# Patient Record
Sex: Male | Born: 1950
Health system: Southern US, Community
[De-identification: ages and names within clinical notes are randomized; demographics above are authoritative.]

## PROBLEM LIST (undated history)

## (undated) DIAGNOSIS — E785 Hyperlipidemia, unspecified: Secondary | ICD-10-CM

## (undated) DIAGNOSIS — M1990 Unspecified osteoarthritis, unspecified site: Secondary | ICD-10-CM

## (undated) DIAGNOSIS — K649 Unspecified hemorrhoids: Secondary | ICD-10-CM

## (undated) DIAGNOSIS — C8583 Other specified types of non-Hodgkin lymphoma, intra-abdominal lymph nodes: Principal | ICD-10-CM

## (undated) DIAGNOSIS — E079 Disorder of thyroid, unspecified: Secondary | ICD-10-CM

## (undated) HISTORY — DX: Unspecified osteoarthritis, unspecified site: M19.90

## (undated) HISTORY — DX: Unspecified hemorrhoids: K64.9

## (undated) HISTORY — DX: Disorder of thyroid, unspecified: E07.9

## (undated) HISTORY — DX: Hyperlipidemia, unspecified: E78.5

## (undated) HISTORY — PX: POLYPECTOMY: SHX149

## (undated) HISTORY — PX: TONSILLECTOMY: SUR1361

## (undated) HISTORY — DX: Other specified types of non-hodgkin lymphoma, intra-abdominal lymph nodes: C85.83

---

## 2000-09-29 ENCOUNTER — Encounter: Admission: RE | Admit: 2000-09-29 | Discharge: 2000-10-18 | Payer: Self-pay | Admitting: Internal Medicine

## 2002-05-22 ENCOUNTER — Encounter: Payer: Self-pay | Admitting: Emergency Medicine

## 2002-05-22 ENCOUNTER — Emergency Department (HOSPITAL_COMMUNITY): Admission: EM | Admit: 2002-05-22 | Discharge: 2002-05-22 | Payer: Self-pay | Admitting: Emergency Medicine

## 2004-05-25 ENCOUNTER — Ambulatory Visit: Payer: Self-pay | Admitting: Internal Medicine

## 2005-03-21 DIAGNOSIS — E079 Disorder of thyroid, unspecified: Secondary | ICD-10-CM

## 2005-03-21 HISTORY — DX: Disorder of thyroid, unspecified: E07.9

## 2005-05-12 ENCOUNTER — Ambulatory Visit: Payer: Self-pay | Admitting: Internal Medicine

## 2005-10-04 ENCOUNTER — Ambulatory Visit: Payer: Self-pay | Admitting: Internal Medicine

## 2005-11-30 ENCOUNTER — Ambulatory Visit: Payer: Self-pay | Admitting: Internal Medicine

## 2005-12-09 ENCOUNTER — Ambulatory Visit: Payer: Self-pay | Admitting: Internal Medicine

## 2005-12-28 ENCOUNTER — Ambulatory Visit: Payer: Self-pay | Admitting: Internal Medicine

## 2006-02-22 ENCOUNTER — Ambulatory Visit: Payer: Self-pay | Admitting: Internal Medicine

## 2006-02-22 LAB — CONVERTED CEMR LAB
Chol/HDL Ratio, serum: 6.1
Cholesterol: 188 mg/dL (ref 0–200)
Glucose, Bld: 83 mg/dL (ref 70–99)
HDL: 30.9 mg/dL — ABNORMAL LOW (ref >39.0)
LDL Cholesterol: 136 mg/dL — ABNORMAL HIGH (ref 0–99)
TSH: 4.44 microintl units/mL (ref 0.35–5.50)
Triglyceride fasting, serum: 106 mg/dL (ref 0–149)
VLDL: 21 mg/dL (ref 0–40)

## 2006-03-01 ENCOUNTER — Ambulatory Visit: Payer: Self-pay | Admitting: Internal Medicine

## 2006-03-20 ENCOUNTER — Ambulatory Visit: Payer: Self-pay | Admitting: Gastroenterology

## 2006-03-21 HISTORY — PX: COLONOSCOPY: SHX174

## 2006-03-28 ENCOUNTER — Ambulatory Visit: Payer: Self-pay | Admitting: Gastroenterology

## 2007-02-06 ENCOUNTER — Ambulatory Visit: Payer: Self-pay | Admitting: Internal Medicine

## 2007-03-06 ENCOUNTER — Telehealth (INDEPENDENT_AMBULATORY_CARE_PROVIDER_SITE_OTHER): Payer: Self-pay | Admitting: *Deleted

## 2007-06-19 ENCOUNTER — Ambulatory Visit: Payer: Self-pay | Admitting: Internal Medicine

## 2007-06-19 DIAGNOSIS — E039 Hypothyroidism, unspecified: Secondary | ICD-10-CM | POA: Insufficient documentation

## 2007-06-20 ENCOUNTER — Encounter: Payer: Self-pay | Admitting: Internal Medicine

## 2007-06-25 ENCOUNTER — Encounter (INDEPENDENT_AMBULATORY_CARE_PROVIDER_SITE_OTHER): Payer: Self-pay | Admitting: *Deleted

## 2007-06-25 ENCOUNTER — Telehealth (INDEPENDENT_AMBULATORY_CARE_PROVIDER_SITE_OTHER): Payer: Self-pay | Admitting: *Deleted

## 2007-07-04 ENCOUNTER — Ambulatory Visit: Payer: Self-pay | Admitting: Internal Medicine

## 2007-07-04 DIAGNOSIS — E785 Hyperlipidemia, unspecified: Secondary | ICD-10-CM | POA: Insufficient documentation

## 2007-10-26 ENCOUNTER — Ambulatory Visit: Payer: Self-pay | Admitting: Internal Medicine

## 2007-10-30 ENCOUNTER — Encounter: Payer: Self-pay | Admitting: Internal Medicine

## 2007-11-02 ENCOUNTER — Ambulatory Visit: Payer: Self-pay | Admitting: Internal Medicine

## 2007-11-02 LAB — CONVERTED CEMR LAB
Cholesterol, target level: 200 mg/dL
HDL goal, serum: 40 mg/dL
LDL Goal: 100 mg/dL

## 2007-11-19 ENCOUNTER — Telehealth (INDEPENDENT_AMBULATORY_CARE_PROVIDER_SITE_OTHER): Payer: Self-pay | Admitting: *Deleted

## 2008-02-04 ENCOUNTER — Ambulatory Visit: Payer: Self-pay | Admitting: Internal Medicine

## 2008-02-11 ENCOUNTER — Ambulatory Visit: Payer: Self-pay | Admitting: Internal Medicine

## 2008-02-11 LAB — CONVERTED CEMR LAB
ALT: 20 units/L (ref 0–53)
AST: 25 units/L (ref 0–37)
Albumin: 4.3 g/dL (ref 3.5–5.2)
Alkaline Phosphatase: 55 units/L (ref 39–117)
Bilirubin, Direct: 0.1 mg/dL (ref 0.0–0.3)
Cholesterol, target level: 200 mg/dL
Cholesterol: 159 mg/dL (ref 0–200)
HDL goal, serum: 40 mg/dL
HDL: 31.9 mg/dL — ABNORMAL LOW (ref >39.0)
LDL Cholesterol: 109 mg/dL — ABNORMAL HIGH (ref 0–99)
LDL Goal: 100 mg/dL
Total Bilirubin: 1 mg/dL (ref 0.3–1.2)
Total CHOL/HDL Ratio: 5
Total Protein: 7.5 g/dL (ref 6.0–8.3)
Triglycerides: 93 mg/dL (ref 0–149)
VLDL: 19 mg/dL (ref 0–40)

## 2008-06-11 ENCOUNTER — Telehealth (INDEPENDENT_AMBULATORY_CARE_PROVIDER_SITE_OTHER): Payer: Self-pay | Admitting: *Deleted

## 2008-08-04 ENCOUNTER — Ambulatory Visit: Payer: Self-pay | Admitting: Internal Medicine

## 2008-08-04 LAB — CONVERTED CEMR LAB
ALT: 21 units/L (ref 0–53)
AST: 25 units/L (ref 0–37)
Albumin: 4.2 g/dL (ref 3.5–5.2)
Alkaline Phosphatase: 56 units/L (ref 39–117)
BUN: 21 mg/dL (ref 6–23)
Bilirubin, Direct: 0 mg/dL (ref 0.0–0.3)
Cholesterol: 151 mg/dL (ref 0–200)
Creatinine, Ser: 1 mg/dL (ref 0.4–1.5)
HDL: 33.1 mg/dL — ABNORMAL LOW (ref >39.00)
Hgb A1c MFr Bld: 5.6 % (ref 4.6–6.5)
LDL Cholesterol: 97 mg/dL (ref 0–99)
Potassium: 4.2 meq/L (ref 3.5–5.1)
Total Bilirubin: 0.7 mg/dL (ref 0.3–1.2)
Total CHOL/HDL Ratio: 5
Total Protein: 7.4 g/dL (ref 6.0–8.3)
Triglycerides: 103 mg/dL (ref 0.0–149.0)
VLDL: 20.6 mg/dL (ref 0.0–40.0)

## 2008-08-05 LAB — CONVERTED CEMR LAB: TSH: 3.52 microintl units/mL (ref 0.35–5.50)

## 2008-08-06 ENCOUNTER — Encounter (INDEPENDENT_AMBULATORY_CARE_PROVIDER_SITE_OTHER): Payer: Self-pay | Admitting: *Deleted

## 2008-08-11 ENCOUNTER — Ambulatory Visit: Payer: Self-pay | Admitting: Internal Medicine

## 2008-08-11 LAB — CONVERTED CEMR LAB: PSA: 0.87 ng/mL (ref 0.10–4.00)

## 2008-08-12 ENCOUNTER — Encounter (INDEPENDENT_AMBULATORY_CARE_PROVIDER_SITE_OTHER): Payer: Self-pay | Admitting: *Deleted

## 2009-02-05 ENCOUNTER — Ambulatory Visit: Payer: Self-pay | Admitting: Internal Medicine

## 2009-05-29 ENCOUNTER — Ambulatory Visit: Payer: Self-pay | Admitting: Internal Medicine

## 2009-07-25 ENCOUNTER — Telehealth (INDEPENDENT_AMBULATORY_CARE_PROVIDER_SITE_OTHER): Payer: Self-pay | Admitting: *Deleted

## 2009-08-21 ENCOUNTER — Ambulatory Visit: Payer: Self-pay | Admitting: Internal Medicine

## 2009-08-21 DIAGNOSIS — R030 Elevated blood-pressure reading, without diagnosis of hypertension: Secondary | ICD-10-CM | POA: Insufficient documentation

## 2009-08-28 LAB — CONVERTED CEMR LAB
ALT: 23 units/L (ref 0–53)
AST: 31 units/L (ref 0–37)
Albumin: 4.6 g/dL (ref 3.5–5.2)
Alkaline Phosphatase: 53 units/L (ref 39–117)
BUN: 21 mg/dL (ref 6–23)
Bilirubin, Direct: 0.2 mg/dL (ref 0.0–0.3)
CO2: 30 meq/L (ref 19–32)
Calcium: 9.3 mg/dL (ref 8.4–10.5)
Chloride: 103 meq/L (ref 96–112)
Cholesterol: 158 mg/dL (ref 0–200)
Creatinine, Ser: 1.1 mg/dL (ref 0.4–1.5)
GFR calc non Af Amer: 73.36 mL/min (ref >60)
Glucose, Bld: 82 mg/dL (ref 70–99)
HDL: 35.8 mg/dL — ABNORMAL LOW (ref >39.00)
LDL Cholesterol: 101 mg/dL — ABNORMAL HIGH (ref 0–99)
Potassium: 4.6 meq/L (ref 3.5–5.1)
Sodium: 141 meq/L (ref 135–145)
TSH: 4.46 microintl units/mL (ref 0.35–5.50)
Total Bilirubin: 0.9 mg/dL (ref 0.3–1.2)
Total CHOL/HDL Ratio: 4
Total Protein: 7.6 g/dL (ref 6.0–8.3)
Triglycerides: 104 mg/dL (ref 0.0–149.0)
VLDL: 20.8 mg/dL (ref 0.0–40.0)

## 2009-12-02 ENCOUNTER — Ambulatory Visit: Payer: Self-pay | Admitting: Internal Medicine

## 2009-12-04 LAB — CONVERTED CEMR LAB: TSH: 4.18 microintl units/mL (ref 0.35–5.50)

## 2010-01-05 ENCOUNTER — Ambulatory Visit: Payer: Self-pay | Admitting: Internal Medicine

## 2010-04-18 LAB — CONVERTED CEMR LAB
BUN: 20 mg/dL (ref 6–23)
CO2: 29 meq/L (ref 19–32)
Calcium: 9.4 mg/dL (ref 8.4–10.5)
Chloride: 106 meq/L (ref 96–112)
Cholesterol, target level: 200 mg/dL
Creatinine, Ser: 1.1 mg/dL (ref 0.4–1.5)
GFR calc Af Amer: 90 mL/min
GFR calc non Af Amer: 74 mL/min
Glucose, Bld: 83 mg/dL (ref 70–99)
HDL goal, serum: 40 mg/dL
LDL Goal: 130 mg/dL
PSA: 0.88 ng/mL (ref 0.10–4.00)
Potassium: 4.2 meq/L (ref 3.5–5.1)
Sodium: 140 meq/L (ref 135–145)
TSH: 5.13 microintl units/mL (ref 0.35–5.50)

## 2010-04-20 NOTE — Progress Notes (Signed)
Summary: pt will c/b to schedule appt  Phone Note Outgoing Call Call back at [facility] Phone [phone] Call back at Work Phone [phone]   Summary of Call: Refilled Pravastatin x 1.  Patient is due rov, fasting with Dr. Milderd Meager [facility]  Jul 25, 2009 10:43 AM   Follow-up for Phone Call        patient will call back this afternoon to schedule .Marland KitchenArbie [place]  Jul 27, 2009 1:42 PM   Additional Follow-up for Phone Call Additional follow up Details #1::        Patient is coming in on 6.3.11 Additional Follow-up by: Elna Breslow,  Jul 28, 2009 9:24 AM

## 2010-04-20 NOTE — Assessment & Plan Note (Signed)
Summary: FLU SHOT/NTA   Nurse Visit  CC: Flu shot./kb   Allergies: No Known Drug Allergies  Orders Added: 1)  Admin 1st Vaccine [90471] 2)  Flu Vaccine 44yrs + [id number]               Flu Vaccine Consent Questions     Do you have a history of severe allergic reactions to this vaccine? no    Any prior history of allergic reactions to egg and/or gelatin? no    Do you have a sensitivity to the preservative Thimersol? no    Do you have a past history of Guillan-Barre Syndrome? no    Do you currently have an acute febrile illness? no    Have you ever had a severe reaction to latex? no    Vaccine information given and explained to patient? yes    Are you currently pregnant? no    Lot Number:[id number]   Exp Date:09/17/2009   Site Given  Left Deltoid IMu

## 2010-04-20 NOTE — Assessment & Plan Note (Signed)
Summary: fasting roa//lch   Vital Signs:  Patient profile:   60 year old male Weight:      214 pounds Pulse rate:   76 / minute Resp:     14 per minute BP sitting:   110 / 70  (left arm) Cuff size:   large  Vitals Entered By: Georgette Dover (August 21, 2009 9:22 AM) CC: 1.) Follow-up on B/P-? if its running too low   2.)Fasting for labs , Hypertension Management Comments REVIEWED MED LIST, PATIENT AGREED DOSE AND INSTRUCTION CORRECT  Compared cuffs: 139/82, P:70   CC:  1.) Follow-up on B/P-? if its running too low   2.)Fasting for labs  and Hypertension Management.  History of Present Illness: BP ranges 108/60- 133/70 w/o meds. 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 FH premature MI (F); 3 bro with HTN. Previously labs @ annual  FD physical  Hypertension History:      He denies headache, chest pain, palpitations, dyspnea with exertion, orthopnea, PND, peripheral edema, visual symptoms, neurologic problems, and syncope.        Positive major cardiovascular risk factors include male age 39 years old or older, hyperlipidemia, and family history for ischemic heart disease (males less than 34 years old).  Negative major cardiovascular risk factors include no history of diabetes, no history of hypertension, and non-tobacco-user status.        Further assessment for target organ damage reveals no history of ASHD, stroke/TIA, or peripheral vascular disease.     Allergies (verified): No Known Drug Allergies  Review of Systems Eyes:  Denies blurring, double vision, and vision loss-both eyes; No PMH of hypertennsive retinal changes. CV:  Denies leg cramps with exertion, lightheadness, and near fainting. Neuro:  Denies numbness and tingling.  Physical Exam  General:  well-nourished; alert,appropriate and cooperative throughout examination Eyes:  No corneal or conjunctival inflammation noted.Perrla. Funduscopic exam benign, without hemorrhages, exudates or papilledema or  hypertensive changes Lungs:  Normal respiratory effort, chest expands symmetrically. Lungs are clear to auscultation, no crackles or wheezes. Heart:  Normal rate and regular rhythm. S1 and S2 normal without gallop, murmur, click, rub or other extra sounds. Pulses:  R and L carotid,radial,dorsalis pedis and posterior tibial pulses are full and equal bilaterally Extremities:  No clubbing, cyanosis, edema.   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 1 at bedtime  Orders: Venipuncture [id number]) TLB-Lipid Panel (80061-LIPID) TLB-Hepatic/Liver Function Pnl (80076-HEPATIC)  Problem # 3:  HYPOTHYROIDISM (ICD-244.9)  His updated medication list for this problem includes:    Levoxyl 50 Mcg Tabs (Levothyroxine sodium) .Marland Kitchen... 1 by mouth once daily except wed he takes 1 and 1/2 tabs. needs to schedule labwork before addtional refills.  Orders: Venipuncture [id number]) TLB-TSH (Thyroid Stimulating Hormone) (84443-TSH)  Complete Medication List: 1)  Levoxyl 50 Mcg Tabs (Levothyroxine sodium) .Marland Kitchen.. 1 by mouth once daily except wed he takes 1 and 1/2 tabs. needs to schedule labwork before addtional refills. 2)  Aspirin 81 Mg Tabs (Aspirin) .Marland Kitchen.. 1 by mouth once daily 3)  Multivitamin  4)  Pravastatin Sodium 40 Mg Tabs (Pravastatin sodium) .Marland Kitchen.. 1 at bedtime  Hypertension Assessment/Plan:  The patient's hypertensive risk group is category B: At least one risk factor (excluding diabetes) with no target organ damage.  His calculated 10 year risk of coronary heart disease is 6 %.  Today's blood pressure is 110/70.    Patient  Instructions: 1)  Check your Blood Pressure regularly. If it is above: 135/85 ON AVERAGE  you should make an appointment. Prescriptions: PRAVASTATIN SODIUM 40 MG  TABS (PRAVASTATIN SODIUM) 1 at bedtime  #90 x 1   Entered and Authorized by:   Unice Cobble MD   Signed by:   Unice Cobble MD on 08/21/2009   Method used:   Faxed to ...       [facility] # [address]* (retail)       [place], [place]  [address]       Ph: [phone]       Fax: [phone]   RxID:   [id number] LEVOXYL 50 MCG TABS (LEVOTHYROXINE SODIUM) 1 by mouth once daily except wed he takes 1 and 1/2 tabs. NEEDS TO SCHEDULE LABWORK BEFORE ADDTIONAL REFILLS.  #90 x 1   Entered and Authorized by:   Unice Cobble MD   Signed by:   Unice Cobble MD on 08/21/2009   Method used:   Electronically to        [facility] [phone]* (retail)       [address]       [place], [place]  [address]       Ph: [phone]       Fax: [phone]   RxID:   [id number]

## 2010-04-20 NOTE — Assessment & Plan Note (Signed)
Summary: bronchtsis/kdc   Vital Signs:  Patient profile:   60 year old male Weight:      217.4 pounds Temp:     98.5 degrees F oral Pulse rate:   64 / minute Resp:     15 per minute BP sitting:   110 / 72  (left arm) Cuff size:   large  Vitals Entered By: Georgette Dover (May 29, 2009 1:42 PM) CC: ? Bronchitis Comments REVIEWED MED LIST, PATIENT AGREED DOSE AND INSTRUCTION CORRECT    CC:  ? Bronchitis.  History of Present Illness: Onset 05/23/2009 as NP cough followed low grade fever. Rx: cough syrup(left over). No PMH of asthma; non smoker. No flu shot.  Allergies (verified): No Known Drug Allergies  Review of Systems General:  Denies chills, fever, and sweats. ENT:  Denies nasal congestion and sinus pressure; No facial pain, frontal headache or purulence. Resp:  Complains of sputum productive and wheezing; denies chest pain with inspiration, coughing up blood, and shortness of breath. Allergy:  Denies itching eyes and sneezing.  Physical Exam  General:  Thin but well-nourished,in no acute distress; alert,appropriate and cooperative throughout examination Ears:  External ear exam shows no significant lesions or deformities.  Otoscopic examination reveals clear canals, tympanic membranes are intact bilaterally without bulging, retraction, inflammation or discharge. Hearing is grossly normal bilaterally. Nose:  External nasal examination shows no deformity or inflammation. Nasal mucosa are pink and moist without lesions or exudates. Slight dislocation bilaterally Mouth:  Oral mucosa and oropharynx without lesions or exudates.  Teeth in good repair.Slightly hoarse Lungs:  Normal respiratory effort, chest expands symmetrically. Lungs are clear to auscultation, no crackles or wheezes. Cervical Nodes:  No lymphadenopathy noted Axillary Nodes:  No palpable lymphadenopathy   Impression & Recommendations:  Problem # 1:  BRONCHITIS-ACUTE (ICD-466.0)  The following medications  were removed from the medication list:    Azithromycin 250 Mg Tabs (Azithromycin) .Marland Kitchen... As per pack His updated medication list for this problem includes:    Promethazine Vc/codeine 6.25-5-10 Mg/51ml Syrp (Phenyleph-promethazine-cod) .Marland Kitchen... 1 tsp q 6 hrs as needed    Azithromycin 250 Mg Tabs (Azithromycin) .Marland Kitchen... As per pack  Complete Medication List: 1)  Levoxyl 50 Mcg Tabs (Levothyroxine sodium) .Marland Kitchen.. 1 by mouth once daily except wed he takes 1 and 1/2 tabs 2)  Baby Asa  3)  Multivitamin  4)  Pravastatin Sodium 40 Mg Tabs (Pravastatin sodium) .Marland Kitchen.. 1 qhs 5)  Promethazine Vc/codeine 6.25-5-10 Mg/60ml Syrp (Phenyleph-promethazine-cod) .Marland Kitchen.. 1 tsp q 6 hrs as needed 6)  Azithromycin 250 Mg Tabs (Azithromycin) .... As per pack  Patient Instructions: 1)  Neti pot once daily for any head congestion. 2)  Drink as much fluid as you can tolerate for the next few days. Prescriptions: AZITHROMYCIN 250 MG TABS (AZITHROMYCIN) as per pack  #1 x 0   Entered and Authorized by:   Unice Cobble MD   Signed by:   Unice Cobble MD on 05/29/2009   Method used:   Faxed to ...       [facility] # [address]* (retail)       [place], [place]  [address]       Ph: [phone]       Fax: [phone]   RxID:   [id number]

## 2010-06-15 ENCOUNTER — Telehealth: Payer: Self-pay | Admitting: Internal Medicine

## 2010-06-15 MED ORDER — LEVOTHYROXINE SODIUM 50 MCG PO TABS: 50.0000 ug | ORAL_TABLET | Freq: Every day | ORAL | Status: DC

## 2010-06-15 NOTE — Telephone Encounter (Signed)
Left message on voicemail for patient to call to discuss, reason for call: I reviewed med list in Old EMR system and med was rx'ed as it previously was. No change that I can see.

## 2010-06-15 NOTE — Telephone Encounter (Signed)
I spoke with patient and he indicated that Dr.Hopper changed his rx sometime last year. I reviewed records again and on 08/21/2009 Dr.Hopper did change med:  (Copied/Pasted) LDL is essentially @ goal of < 100. TSH goal = 1-3. INCREASE thyroid to 1 once daily EXCEPT 1& 1/2 T & Th. Recheck TSH in 3 months (244.9). Hopp  Patient aware I will change med instruction and send new script to pharmacy

## 2010-06-15 NOTE — Telephone Encounter (Signed)
Rx for Levothyroxine 50 mg was just refilled. Pt says this isn't the original dosage and wants to know why it was changed. Please advise.

## 2010-06-16 ENCOUNTER — Telehealth: Payer: Self-pay | Admitting: Internal Medicine

## 2010-06-16 NOTE — Telephone Encounter (Signed)
Lipid,Hep,BMP,CBCD,TSH,PSA,Stool Cards,Udip- v70.0/272.4/244.9

## 2010-06-16 NOTE — Telephone Encounter (Signed)
Added lab orders for 6/12 lab visit

## 2010-06-16 NOTE — Telephone Encounter (Signed)
Patient has CPX with Dr Linna Darner on 09/07/2010,   Has labs scheduled for 08/31/2010----may I please have lab orders and diag codes ??     thanks

## 2010-07-24 ENCOUNTER — Other Ambulatory Visit: Payer: Self-pay | Admitting: Internal Medicine

## 2010-08-30 ENCOUNTER — Other Ambulatory Visit: Payer: Self-pay | Admitting: Internal Medicine

## 2010-08-30 DIAGNOSIS — E039 Hypothyroidism, unspecified: Secondary | ICD-10-CM

## 2010-08-30 DIAGNOSIS — E785 Hyperlipidemia, unspecified: Secondary | ICD-10-CM

## 2010-08-30 DIAGNOSIS — Z0000 Encounter for general adult medical examination without abnormal findings: Secondary | ICD-10-CM

## 2010-08-31 ENCOUNTER — Other Ambulatory Visit (INDEPENDENT_AMBULATORY_CARE_PROVIDER_SITE_OTHER): Payer: PRIVATE HEALTH INSURANCE

## 2010-08-31 DIAGNOSIS — Z0000 Encounter for general adult medical examination without abnormal findings: Secondary | ICD-10-CM

## 2010-08-31 DIAGNOSIS — E785 Hyperlipidemia, unspecified: Secondary | ICD-10-CM

## 2010-08-31 DIAGNOSIS — E039 Hypothyroidism, unspecified: Secondary | ICD-10-CM

## 2010-08-31 LAB — CBC WITH DIFFERENTIAL/PLATELET
Basophils Relative: 0.5 % (ref 0.0–3.0)
Eosinophils Relative: 1 % (ref 0.0–5.0)
Lymphocytes Relative: 73.7 % — ABNORMAL HIGH (ref 12.0–46.0)
Neutrophils Relative %: 21.6 % — ABNORMAL LOW (ref 43.0–77.0)
Platelets: 146 10*3/uL — ABNORMAL LOW (ref 150.0–400.0)
RBC: 4.57 Mil/uL (ref 4.22–5.81)
WBC: 16.1 10*3/uL — ABNORMAL HIGH (ref 4.5–10.5)

## 2010-08-31 LAB — POCT URINALYSIS DIPSTICK
Bilirubin, UA: NEGATIVE
Glucose, UA: NEGATIVE
Leukocytes, UA: NEGATIVE
Nitrite, UA: NEGATIVE
pH, UA: 5

## 2010-08-31 LAB — BASIC METABOLIC PANEL
CO2: 27 mEq/L (ref 19–32)
Calcium: 9.3 mg/dL (ref 8.4–10.5)
Chloride: 105 mEq/L (ref 96–112)
Creatinine, Ser: 1.3 mg/dL (ref 0.4–1.5)
GFR: 61.36 mL/min (ref >60.00)
Potassium: 4.4 mEq/L (ref 3.5–5.1)
Sodium: 137 mEq/L (ref 135–145)

## 2010-08-31 LAB — LIPID PANEL
HDL: 34.4 mg/dL — ABNORMAL LOW (ref >39.00)
Total CHOL/HDL Ratio: 5
VLDL: 22.4 mg/dL (ref 0.0–40.0)

## 2010-08-31 LAB — PSA: PSA: 0.92 ng/mL (ref 0.10–4.00)

## 2010-08-31 LAB — HEPATIC FUNCTION PANEL
Bilirubin, Direct: 0.1 mg/dL (ref 0.0–0.3)
Total Bilirubin: 0.7 mg/dL (ref 0.3–1.2)

## 2010-08-31 NOTE — Progress Notes (Signed)
Labs only

## 2010-09-04 ENCOUNTER — Encounter: Payer: Self-pay | Admitting: Internal Medicine

## 2010-09-07 ENCOUNTER — Ambulatory Visit (INDEPENDENT_AMBULATORY_CARE_PROVIDER_SITE_OTHER): Payer: PRIVATE HEALTH INSURANCE | Admitting: Internal Medicine

## 2010-09-07 ENCOUNTER — Encounter: Payer: Self-pay | Admitting: Internal Medicine

## 2010-09-07 VITALS — BP 118/70 | HR 72 | Temp 98.4°F | Resp 14 | Ht 71.0 in | Wt 216.6 lb

## 2010-09-07 DIAGNOSIS — Z23 Encounter for immunization: Secondary | ICD-10-CM

## 2010-09-07 DIAGNOSIS — D72820 Lymphocytosis (symptomatic): Secondary | ICD-10-CM

## 2010-09-07 DIAGNOSIS — E039 Hypothyroidism, unspecified: Secondary | ICD-10-CM

## 2010-09-07 DIAGNOSIS — E785 Hyperlipidemia, unspecified: Secondary | ICD-10-CM

## 2010-09-07 DIAGNOSIS — T148XXA Other injury of unspecified body region, initial encounter: Secondary | ICD-10-CM

## 2010-09-07 DIAGNOSIS — Z0000 Encounter for general adult medical examination without abnormal findings: Secondary | ICD-10-CM

## 2010-09-07 DIAGNOSIS — W57XXXA Bitten or stung by nonvenomous insect and other nonvenomous arthropods, initial encounter: Secondary | ICD-10-CM

## 2010-09-07 MED ORDER — PRAVASTATIN SODIUM 40 MG PO TABS: 40.0000 mg | ORAL_TABLET | Freq: Every day | ORAL | Status: DC

## 2010-09-07 MED ORDER — LEVOTHYROXINE SODIUM 75 MCG PO TABS: 75.0000 ug | ORAL_TABLET | Freq: Every day | ORAL | Status: DC

## 2010-09-07 MED ORDER — TETANUS-DIPHTH-ACELL PERTUSSIS 5-2.5-18.5 LF-MCG/0.5 IM SUSP
0.5000 mL | Freq: Once | INTRAMUSCULAR | Status: AC
  Administered 2010-09-07: 0.5 mL via INTRAMUSCULAR

## 2010-09-07 NOTE — Progress Notes (Signed)
Subjective:    Patient ID: [name] [name], male    DOB: 11/18/1950, 60 y.o.   MRN: [id number]  HPI  [name] [name]  is here for a physical;acute issues include tick bite L ear 4 weeks ago . No sequellae to date.       Review of Systems Patient reports no  Significant  vision/ hearing changes,anorexia, weight change, fever ,adenopathy, persistant / recurrent hoarseness, swallowing issues, chest pain,palpitations, edema,persistant / recurrent cough, hemoptysis, dyspnea(rest, exertional, paroxysmal nocturnal), gastrointestinal  bleeding (melena, rectal bleeding), abdominal pain, excessive heart burn, GU symptoms( dysuria, hematuria, pyuria, voiding/incontinence  issues) syncope, focal weakness, memory loss,numbness & tingling, skin/hair/nail changes,depression, anxiety, or normal bruising/bleeding.Musculoskeletal symptoms of occasional  R foot pain responsive to Aleve. Isolated night sweats X 3 in past 2 weeks.     Objective:   Physical Exam Gen.: Healthy and well-nourished in appearance. Alert, appropriate and cooperative throughout exam. Head: Normocephalic without obvious abnormalities;  pattern alopecia  Eyes: No corneal or conjunctival inflammation noted. Pupils equal round reactive to light and accommodation. Fundal exam is benign without hemorrhages, exudate, papilledema. Extraocular motion intact. Vision grossly normal. Ears: External  ear exam reveals no significant lesions or deformities. Canals clear .TMs normal. Hearing is grossly normal bilaterally. Nose: External nasal exam reveals no deformity or inflammation. Nasal mucosa are pink and moist. No lesions or exudates noted.  Mouth: Oral mucosa and oropharynx reveal no lesions or exudates. Teeth in good repair. Neck: No deformities, masses, or tenderness noted. Range of motion &. Thyroid  normal. Lungs: Normal respiratory effort; chest expands symmetrically. Lungs are clear to auscultation without rales, wheezes, or increased work of  breathing. Heart: Normal rate and rhythm. Normal S1 and S2. No gallop, click, or rub. No  murmur. Abdomen: Bowel sounds normal; abdomen soft and nontender. No masses, organomegaly or hernias noted. Genitalia/ DRE: Hydrocele on L; prostate ULN w/o nodules. Tiny internal hemorrhoid.                                                                                    Musculoskeletal/extremities: No deformity or scoliosis noted of  the thoracic or lumbar spine. No clubbing, cyanosis, edema, or deformity noted. Range of motion  normal .Tone & strength  normal.Joints normal. Nail health  Good.minor crepitus of knees. Vascular: Carotid, radial artery, dorsalis pedis and dorsalis posterior tibial pulses are full and equal. No bruits present. Neurologic: Alert and oriented x3. Deep tendon reflexes symmetrical and normal.           Skin: Intact without suspicious lesions or rashes. Lymph: No cervical, axillary, or inguinal lymphadenopathy present. Psych: Mood and affect are normal. Normally interactive                                                                                         Assessment &  Plan:  #1 comprehensive physical exam; no acute findings #2 see Problem List with Assessments & Recommendations #3 S//P tick bite #4 Lymphocytosis  Plan: see Orders

## 2010-09-07 NOTE — Patient Instructions (Addendum)
Preventive Health Care: Exercise at least 30-45 minutes a day,  3-4 days a week.  Eat a low-fat diet with lots of fruits and vegetables, up to 7-9 servings per day. Avoid obesity; your goal is waist measurement < 40 inches.Consume less than 40 grams of sugar per day from foods & drinks with High Fructose Corn Sugar as #2,3 or # 4 on label. Please  schedule fasting Labs in 10 weeks : NMR Lipoprofile , CBC & dif, TSH. See Diagnoses for Codes . Report fever, rash or other signs of infection

## 2010-09-08 ENCOUNTER — Other Ambulatory Visit (INDEPENDENT_AMBULATORY_CARE_PROVIDER_SITE_OTHER): Payer: PRIVATE HEALTH INSURANCE

## 2010-09-08 DIAGNOSIS — Z1211 Encounter for screening for malignant neoplasm of colon: Secondary | ICD-10-CM

## 2010-09-08 LAB — HEMOCCULT GUIAC POC 1CARD (OFFICE)

## 2010-09-08 LAB — ROCKY MTN SPOTTED FVR AB, IGM-BLOOD: ROCKY MTN SPOTTED FEVER, IGM: 0.33 IV

## 2010-09-08 NOTE — Progress Notes (Signed)
Labs only

## 2010-09-09 ENCOUNTER — Other Ambulatory Visit: Payer: Self-pay | Admitting: Internal Medicine

## 2010-11-16 ENCOUNTER — Other Ambulatory Visit: Payer: Self-pay | Admitting: Internal Medicine

## 2010-11-16 DIAGNOSIS — Z8249 Family history of ischemic heart disease and other diseases of the circulatory system: Secondary | ICD-10-CM

## 2010-11-16 DIAGNOSIS — E039 Hypothyroidism, unspecified: Secondary | ICD-10-CM

## 2010-11-16 DIAGNOSIS — E785 Hyperlipidemia, unspecified: Secondary | ICD-10-CM

## 2010-11-17 ENCOUNTER — Other Ambulatory Visit (INDEPENDENT_AMBULATORY_CARE_PROVIDER_SITE_OTHER): Payer: PRIVATE HEALTH INSURANCE

## 2010-11-17 ENCOUNTER — Other Ambulatory Visit: Payer: Self-pay | Admitting: Internal Medicine

## 2010-11-17 DIAGNOSIS — E039 Hypothyroidism, unspecified: Secondary | ICD-10-CM

## 2010-11-17 DIAGNOSIS — Z8249 Family history of ischemic heart disease and other diseases of the circulatory system: Secondary | ICD-10-CM

## 2010-11-17 DIAGNOSIS — E785 Hyperlipidemia, unspecified: Secondary | ICD-10-CM

## 2010-11-17 LAB — CBC WITH DIFFERENTIAL/PLATELET
Basophils Absolute: 0 10*3/uL (ref 0.0–0.1)
Basophils Relative: 0.3 % (ref 0.0–3.0)
Eosinophils Absolute: 0.1 10*3/uL (ref 0.0–0.7)
HCT: 40.9 % (ref 39.0–52.0)
Hemoglobin: 13.3 g/dL (ref 13.0–17.0)
Lymphs Abs: 10.8 10*3/uL — ABNORMAL HIGH (ref 0.7–4.0)
MCHC: 32.6 g/dL (ref 30.0–36.0)
Monocytes Relative: 4.3 % (ref 3.0–12.0)
Neutro Abs: 2.7 10*3/uL (ref 1.4–7.7)
RDW: 14.2 % (ref 11.5–14.6)

## 2010-11-17 NOTE — Progress Notes (Signed)
Labs only

## 2010-11-30 ENCOUNTER — Telehealth: Payer: Self-pay | Admitting: Internal Medicine

## 2010-11-30 NOTE — Telephone Encounter (Signed)
Message copied by Desmond Dike on Tue Nov 30, 2010 12:11 PM ------      Message from: Hendricks Limes      Created: Fri Nov 19, 2010  6:53 AM       Please ask him to schedule followup appt to discuss labs; bring meds

## 2010-11-30 NOTE — Telephone Encounter (Signed)
Pt has appt on 12/07/2010 to discuss labs

## 2010-12-07 ENCOUNTER — Ambulatory Visit (INDEPENDENT_AMBULATORY_CARE_PROVIDER_SITE_OTHER): Payer: PRIVATE HEALTH INSURANCE | Admitting: Internal Medicine

## 2010-12-07 ENCOUNTER — Encounter: Payer: Self-pay | Admitting: Internal Medicine

## 2010-12-07 VITALS — BP 130/82 | HR 74 | Temp 98.9°F | Wt 215.8 lb

## 2010-12-07 DIAGNOSIS — Z0000 Encounter for general adult medical examination without abnormal findings: Secondary | ICD-10-CM

## 2010-12-07 DIAGNOSIS — D72820 Lymphocytosis (symptomatic): Secondary | ICD-10-CM

## 2010-12-07 DIAGNOSIS — R509 Fever, unspecified: Secondary | ICD-10-CM

## 2010-12-07 DIAGNOSIS — E039 Hypothyroidism, unspecified: Secondary | ICD-10-CM

## 2010-12-07 DIAGNOSIS — R030 Elevated blood-pressure reading, without diagnosis of hypertension: Secondary | ICD-10-CM

## 2010-12-07 DIAGNOSIS — E785 Hyperlipidemia, unspecified: Secondary | ICD-10-CM

## 2010-12-08 LAB — CBC WITH DIFFERENTIAL/PLATELET
Basophils Relative: 0.2 % (ref 0.0–3.0)
Eosinophils Absolute: 0.2 10*3/uL (ref 0.0–0.7)
HCT: 37.2 % — ABNORMAL LOW (ref 39.0–52.0)
Lymphs Abs: 10.2 10*3/uL — ABNORMAL HIGH (ref 0.7–4.0)
MCHC: 33.1 g/dL (ref 30.0–36.0)
MCV: 88.5 fl (ref 78.0–100.0)
Monocytes Absolute: 0.5 10*3/uL (ref 0.1–1.0)
Neutrophils Relative %: 23.2 % — ABNORMAL LOW (ref 43.0–77.0)
RBC: 4.21 Mil/uL — ABNORMAL LOW (ref 4.22–5.81)

## 2010-12-11 ENCOUNTER — Encounter: Payer: Self-pay | Admitting: Internal Medicine

## 2010-12-11 MED ORDER — LEVOTHYROXINE SODIUM 75 MCG PO TABS: 75.0000 ug | ORAL_TABLET | ORAL | Status: DC

## 2010-12-11 NOTE — Progress Notes (Signed)
Subjective:    Patient ID: [name] [name], male    DOB: 09-27-1950, 60 y.o.   MRN: [id number]  HPI  Mr [name] [name]  is here for a physical;acute issues are outlined below    #1Dyslipidemia assessment: Prior Advanced Lipid Testing: NMR Lipoprofile:LDL 88 (1908/1353) HDL 36, TG 91.LDL goal = < 60.   Family history of premature CAD/ MI: Father MI @ 12 ;P aunt MI pre 40.  Nutrition: heart healthy . Diabetes : no . Rx: Pravastatin 40 mg Lab results reviewed :LDL 118, HDL 34.4.(see Plan)   #2 Thyroid function monitor  Medications status(change in dose/brand/mode of administration):generic thyroxine 75 mcg/ day Constitutional: Weight change: no; Fatigue:some with recent illness(see below); Appetite:good  Visual change(blurred/diplopia/visual loss):no Hoarseness:no; Swallowing issues:no Cardiovascular: Palpitations:no; Racing:no; Irregularity:no GI: Constipation:no; Diarrhea:no Derm: Change in nails/hair/skin:no Neuro: Numbness/tingling:no Psych: Anxiety:no, but concerned about CBC & dif results Endo: Temperature intolerance: Heat:no; Cold:no Labs: TSH 4.46 (6.82 in June)   #3 Febrile Illness: Onset/symptoms:6 days ago as "flu like " symptoms with arthralgias/ myalgias Exposures (illness/environmental/extrinsic):no Progression of symptoms:temp decreasing; initially up to 102 Present symptoms: Frontal headache:no Facial pain:no Nasal purulence:no Sore throat:no Dental pain:no Lymphadenopathy:no Wheezing/shortness of breath:no Cough/sputum/hemoptysis:no Labs: WBC 14.3 with 75.6 L, 19.1 N          Review of Systems     Objective:   Physical Exam Gen.: Healthy and well-nourished in appearance. Alert, appropriate and cooperative throughout exam. Eyes: No corneal or conjunctival inflammation noted.  Extraocular motion intact. No lid lag. No icterus Ears: External  ear exam reveals no significant lesions or deformities. Canals clear .TMs normal. Hearing is grossly normal  bilaterally. Nose: External nasal exam reveals no deformity or inflammation. Nasal mucosa are pink and moist. No lesions or exudates noted. Septum  Dislocated slightly Mouth: Oral mucosa and oropharynx reveal no lesions or exudates. Teeth in good repair. Neck: No deformities, masses, or tenderness noted. Range of motion &. Thyroid normal. Lungs: Normal respiratory effort; chest expands symmetrically. Lungs are clear to auscultation without rales, wheezes, or increased work of breathing. Heart: Normal rate and rhythm. Normal S1 and S2. No gallop, click, or rub. S4; no  murmur. Abdomen: Bowel sounds normal; abdomen soft and nontender. No masses, organomegaly or hernias noted.                                                          Musculoskeletal/extremities: No clubbing, cyanosis, edema, or deformity noted. .Joints normal. Nail health  good. Vascular: Carotid, radial artery, dorsalis pedis and  posterior tibial pulses are full and equal. No bruits present. Neurologic: Alert and oriented x3.   Skin: Intact without suspicious lesions or rashes. Lymph: No cervical, axillary  lymphadenopathy present. Psych: Mood and affect are normal. Normally interactive                                                                                         Assessment & Plan:   #1#1 comprehensive physical  exam #2 dyslipidemia; premature CAD/MI in [place]. LDL not @ goal. Pravastatin will be  changed to Simvastatin 40 mg @ next refill in 90 days if LDL not @ least  < 100 (ideally <70) with increase in thyroid dose # 3 hypothyroidism ; TSH goal = 1-3. Levothyroxine 75 mcg 1 daily  EXCEPT 1&1/2 Tues & Th. Recheck TSH in 10 weeks #4 febrile illness in context of persistent Lymphocytosis; R/O CLL. Recheck CBC & dif because of recent flu like illness which appears to be resolving.Heme referral

## 2010-12-11 NOTE — Patient Instructions (Signed)
Please  schedule fasting Labs : Lipids, hepatic panel, CBC & dif, TSH  In 10 weeks. Please bring these instructions to that Lab appt.

## 2010-12-20 ENCOUNTER — Ambulatory Visit: Payer: PRIVATE HEALTH INSURANCE | Admitting: Hematology & Oncology

## 2010-12-21 ENCOUNTER — Other Ambulatory Visit (INDEPENDENT_AMBULATORY_CARE_PROVIDER_SITE_OTHER): Payer: PRIVATE HEALTH INSURANCE

## 2010-12-21 DIAGNOSIS — D649 Anemia, unspecified: Secondary | ICD-10-CM

## 2010-12-21 NOTE — Progress Notes (Signed)
Labs only

## 2010-12-22 LAB — FOLATE: Folate: >24.8 ng/mL (ref >5.9)

## 2010-12-22 LAB — CBC WITH DIFFERENTIAL/PLATELET
Basophils Absolute: 0.1 10*3/uL (ref 0.0–0.1)
Eosinophils Relative: 0.6 % (ref 0.0–5.0)
HCT: 39.6 % (ref 39.0–52.0)
Hemoglobin: 12.9 g/dL — ABNORMAL LOW (ref 13.0–17.0)
Lymphocytes Relative: 83.3 % — ABNORMAL HIGH (ref 12.0–46.0)
Lymphs Abs: 13.1 10*3/uL — ABNORMAL HIGH (ref 0.7–4.0)
Monocytes Relative: 0.6 % — ABNORMAL LOW (ref 3.0–12.0)
Neutro Abs: 2.3 10*3/uL (ref 1.4–7.7)
RBC: 4.42 Mil/uL (ref 4.22–5.81)
RDW: 14.3 % (ref 11.5–14.6)
WBC: 15.7 10*3/uL — ABNORMAL HIGH (ref 4.5–10.5)

## 2010-12-22 LAB — VITAMIN B12: Vitamin B-12: 480 pg/mL (ref 211–911)

## 2010-12-22 LAB — IBC PANEL: Saturation Ratios: 12.9 % — ABNORMAL LOW (ref 20.0–50.0)

## 2010-12-23 ENCOUNTER — Other Ambulatory Visit (INDEPENDENT_AMBULATORY_CARE_PROVIDER_SITE_OTHER): Payer: PRIVATE HEALTH INSURANCE

## 2010-12-23 DIAGNOSIS — Z1211 Encounter for screening for malignant neoplasm of colon: Secondary | ICD-10-CM

## 2010-12-23 LAB — HEMOCCULT GUIAC POC 1CARD (OFFICE)

## 2011-01-05 ENCOUNTER — Other Ambulatory Visit: Payer: Self-pay | Admitting: Hematology & Oncology

## 2011-01-05 ENCOUNTER — Ambulatory Visit (HOSPITAL_BASED_OUTPATIENT_CLINIC_OR_DEPARTMENT_OTHER): Payer: PRIVATE HEALTH INSURANCE | Admitting: Hematology & Oncology

## 2011-01-05 ENCOUNTER — Other Ambulatory Visit (HOSPITAL_COMMUNITY)
Admission: RE | Admit: 2011-01-05 | Discharge: 2011-01-05 | Disposition: A | Discharge status: Home / Self Care | Payer: PRIVATE HEALTH INSURANCE | Source: Ambulatory Visit | Attending: Hematology & Oncology | Admitting: Hematology & Oncology

## 2011-01-05 DIAGNOSIS — D72820 Lymphocytosis (symptomatic): Secondary | ICD-10-CM | POA: Insufficient documentation

## 2011-01-05 LAB — CBC WITH DIFFERENTIAL (CANCER CENTER ONLY)
BASO#: 0.1 10*3/uL (ref 0.0–0.2)
HCT: 38.6 % — ABNORMAL LOW (ref 38.7–49.9)
HGB: 13.3 g/dL (ref 13.0–17.1)
LYMPH#: 13.6 10*3/uL — ABNORMAL HIGH (ref 0.9–3.3)
LYMPH%: 78 % — ABNORMAL HIGH (ref 14.0–48.0)
MCHC: 34.5 g/dL (ref 32.0–35.9)
MCV: 86 fL (ref 82–98)
MONO#: 0.4 10*3/uL (ref 0.1–0.9)
NEUT%: 18.6 % — ABNORMAL LOW (ref 40.0–80.0)
WBC: 17.4 10*3/uL — ABNORMAL HIGH (ref 4.0–10.0)

## 2011-01-05 LAB — CHCC SATELLITE - SMEAR

## 2011-01-06 ENCOUNTER — Other Ambulatory Visit: Payer: Self-pay | Admitting: Hematology & Oncology

## 2011-01-06 DIAGNOSIS — D72820 Lymphocytosis (symptomatic): Secondary | ICD-10-CM

## 2011-01-10 LAB — PROTEIN ELECTROPHORESIS, SERUM
Albumin ELP: 55.1 % — ABNORMAL LOW (ref 55.8–66.1)
Alpha-1-Globulin: 4.3 % (ref 2.9–4.9)
Beta Globulin: 5 % (ref 4.7–7.2)
Total Protein, Serum Electrophoresis: 7.6 g/dL (ref 6.0–8.3)

## 2011-01-10 LAB — FLOW CYTOMETRY - CHCC SATELLITE

## 2011-01-11 ENCOUNTER — Other Ambulatory Visit: Payer: Self-pay | Admitting: Hematology & Oncology

## 2011-01-11 ENCOUNTER — Ambulatory Visit (HOSPITAL_COMMUNITY)
Admission: RE | Admit: 2011-01-11 | Discharge: 2011-01-11 | Disposition: A | Discharge status: Home / Self Care | Payer: PRIVATE HEALTH INSURANCE | Source: Ambulatory Visit | Attending: Hematology & Oncology | Admitting: Hematology & Oncology

## 2011-01-11 DIAGNOSIS — E039 Hypothyroidism, unspecified: Secondary | ICD-10-CM | POA: Insufficient documentation

## 2011-01-11 DIAGNOSIS — D72820 Lymphocytosis (symptomatic): Secondary | ICD-10-CM | POA: Insufficient documentation

## 2011-01-11 DIAGNOSIS — E785 Hyperlipidemia, unspecified: Secondary | ICD-10-CM | POA: Insufficient documentation

## 2011-01-11 DIAGNOSIS — Z79899 Other long term (current) drug therapy: Secondary | ICD-10-CM | POA: Insufficient documentation

## 2011-01-11 DIAGNOSIS — Z01810 Encounter for preprocedural cardiovascular examination: Secondary | ICD-10-CM | POA: Insufficient documentation

## 2011-01-11 LAB — CBC
HCT: 38.9 % — ABNORMAL LOW (ref 39.0–52.0)
Hemoglobin: 12.9 g/dL — ABNORMAL LOW (ref 13.0–17.0)
MCH: 29.1 pg (ref 26.0–34.0)
RBC: 4.43 MIL/uL (ref 4.22–5.81)

## 2011-01-11 LAB — DIFFERENTIAL
Basophils Relative: 0 % (ref 0–1)
Eosinophils Relative: 1 % (ref 0–5)
Lymphs Abs: 10.1 10*3/uL — ABNORMAL HIGH (ref 0.7–4.0)
Monocytes Absolute: 0.8 10*3/uL (ref 0.1–1.0)
Monocytes Relative: 6 % (ref 3–12)
Neutro Abs: 2.7 10*3/uL (ref 1.7–7.7)

## 2011-01-12 ENCOUNTER — Other Ambulatory Visit: Payer: Self-pay | Admitting: Hematology & Oncology

## 2011-01-13 ENCOUNTER — Ambulatory Visit (HOSPITAL_BASED_OUTPATIENT_CLINIC_OR_DEPARTMENT_OTHER)
Admission: RE | Admit: 2011-01-13 | Discharge: 2011-01-13 | Disposition: A | Discharge status: Home / Self Care | Payer: PRIVATE HEALTH INSURANCE | Source: Ambulatory Visit | Attending: Hematology & Oncology | Admitting: Hematology & Oncology

## 2011-01-13 ENCOUNTER — Other Ambulatory Visit (HOSPITAL_COMMUNITY): Payer: PRIVATE HEALTH INSURANCE

## 2011-01-13 DIAGNOSIS — C9110 Chronic lymphocytic leukemia of B-cell type not having achieved remission: Secondary | ICD-10-CM | POA: Insufficient documentation

## 2011-01-13 DIAGNOSIS — D72820 Lymphocytosis (symptomatic): Secondary | ICD-10-CM

## 2011-01-13 DIAGNOSIS — R161 Splenomegaly, not elsewhere classified: Secondary | ICD-10-CM | POA: Insufficient documentation

## 2011-01-13 MED ORDER — IOHEXOL 300 MG/ML  SOLN
100.0000 mL | Freq: Once | INTRAMUSCULAR | Status: AC | PRN
  Administered 2011-01-13: 100 mL via INTRAVENOUS

## 2011-01-19 ENCOUNTER — Telehealth: Payer: Self-pay | Admitting: Hematology & Oncology

## 2011-01-19 LAB — CHROMOSOME ANALYSIS, BONE MARROW

## 2011-01-19 LAB — TISSUE HYBRIDIZATION (BONE MARROW)-NCBH

## 2011-01-24 NOTE — Telephone Encounter (Signed)
Pt aware.

## 2011-01-27 ENCOUNTER — Encounter: Payer: Self-pay | Admitting: Hematology & Oncology

## 2011-01-27 ENCOUNTER — Other Ambulatory Visit: Payer: Self-pay | Admitting: Hematology & Oncology

## 2011-01-27 DIAGNOSIS — C8583 Other specified types of non-Hodgkin lymphoma, intra-abdominal lymph nodes: Secondary | ICD-10-CM

## 2011-01-27 HISTORY — DX: Other specified types of non-hodgkin lymphoma, intra-abdominal lymph nodes: C85.83

## 2011-01-28 ENCOUNTER — Ambulatory Visit: Payer: BC Managed Care – PPO | Admitting: Hematology & Oncology

## 2011-01-28 ENCOUNTER — Ambulatory Visit (HOSPITAL_BASED_OUTPATIENT_CLINIC_OR_DEPARTMENT_OTHER): Payer: BC Managed Care – PPO

## 2011-01-28 ENCOUNTER — Other Ambulatory Visit: Payer: Self-pay | Admitting: Hematology & Oncology

## 2011-01-28 VITALS — BP 123/70 | HR 74 | Temp 98.5°F

## 2011-01-28 DIAGNOSIS — Z5112 Encounter for antineoplastic immunotherapy: Secondary | ICD-10-CM

## 2011-01-28 DIAGNOSIS — C8583 Other specified types of non-Hodgkin lymphoma, intra-abdominal lymph nodes: Secondary | ICD-10-CM

## 2011-01-28 DIAGNOSIS — Z5111 Encounter for antineoplastic chemotherapy: Secondary | ICD-10-CM

## 2011-01-28 DIAGNOSIS — C9110 Chronic lymphocytic leukemia of B-cell type not having achieved remission: Secondary | ICD-10-CM

## 2011-01-28 MED ORDER — SODIUM CHLORIDE 0.9 % IV SOLN
375.0000 mg/m2 | Freq: Once | INTRAVENOUS | Status: AC
  Administered 2011-01-28: 800 mg via INTRAVENOUS
  Filled 2011-01-28: qty 80

## 2011-01-28 MED ORDER — DIPHENHYDRAMINE HCL 25 MG PO CAPS
50.0000 mg | ORAL_CAPSULE | Freq: Once | ORAL | Status: AC
  Administered 2011-01-28: 50 mg via ORAL

## 2011-01-28 MED ORDER — SODIUM CHLORIDE 0.9 % IV SOLN
800.0000 mg/m2 | Freq: Once | INTRAVENOUS | Status: AC
  Administered 2011-01-28: 1760 mg via INTRAVENOUS
  Filled 2011-01-28: qty 88

## 2011-01-28 MED ORDER — PREDNISONE 20 MG PO TABS: 60.0000 mg | ORAL_TABLET | Freq: Every day | ORAL | Status: AC

## 2011-01-28 MED ORDER — VINCRISTINE SULFATE CHEMO INJECTION 1 MG/ML
2.0000 mg | Freq: Once | INTRAVENOUS | Status: AC
  Administered 2011-01-28: 2 mg via INTRAVENOUS
  Filled 2011-01-28: qty 2

## 2011-01-28 MED ORDER — ONDANSETRON HCL 8 MG PO TABS: ORAL_TABLET | ORAL | Status: DC

## 2011-01-28 MED ORDER — ONDANSETRON 16 MG/50ML IVPB (CHCC)
16.0000 mg | Freq: Once | INTRAVENOUS | Status: AC
  Administered 2011-01-28: 16 mg via INTRAVENOUS
  Filled 2011-01-28: qty 16

## 2011-01-28 MED ORDER — PROCHLORPERAZINE 25 MG RE SUPP: 25.0000 mg | Freq: Two times a day (BID) | RECTAL | Status: DC | PRN

## 2011-01-28 MED ORDER — ACETAMINOPHEN 325 MG PO TABS
650.0000 mg | ORAL_TABLET | Freq: Once | ORAL | Status: AC
  Administered 2011-01-28: 650 mg via ORAL

## 2011-01-28 MED ORDER — DEXAMETHASONE SODIUM PHOSPHATE 4 MG/ML IJ SOLN
20.0000 mg | Freq: Once | INTRAMUSCULAR | Status: AC
  Administered 2011-01-28: 20 mg via INTRAVENOUS

## 2011-01-28 MED ORDER — LORAZEPAM 0.5 MG PO TABS: 0.5000 mg | ORAL_TABLET | Freq: Four times a day (QID) | ORAL | Status: DC | PRN

## 2011-01-28 MED ORDER — SODIUM CHLORIDE 0.9 % IV SOLN
Freq: Once | INTRAVENOUS | Status: AC
  Administered 2011-01-28: 10:00:00 via INTRAVENOUS

## 2011-01-28 MED ORDER — PROCHLORPERAZINE MALEATE 10 MG PO TABS: 10.0000 mg | ORAL_TABLET | Freq: Four times a day (QID) | ORAL | Status: DC | PRN

## 2011-02-01 ENCOUNTER — Telehealth: Payer: Self-pay | Admitting: Family

## 2011-02-02 ENCOUNTER — Encounter: Payer: Self-pay | Admitting: *Deleted

## 2011-02-02 NOTE — Telephone Encounter (Signed)
No answer, left message for wife to call back tomorrow if still experiencing hoarseness.

## 2011-02-03 NOTE — Op Note (Signed)
  NAME:  GENEVA, LEAVELLE NO.:  [id number]  MEDICAL RECORD NO.:  [id number]  LOCATION:  MDC                          FACILITY:  [facility]  PHYSICIAN:  [name], M.D.  DATE OF BIRTH:  12/12/1950  DATE OF PROCEDURE:  01/11/2011 DATE OF DISCHARGE:                              OPERATIVE REPORT   NATURE OF PROCEDURE:  Left posterior iliac crest bone marrow biopsy and aspirate.  [name] was brought to the short-stay unit.  He had an IV placed without difficulty.  His Mallampati score was 1.  His ASA class was 1.  He was then placed onto his side.  He had the appropriate time-out procedure performed.  He received total of 5 mg of Versed and 25 mg Demerol for IV sedation.  The left posterior iliac crest region was prepped and draped in sterile fashion.  10 cc of 2% lidocaine was infiltrated under the skin down the periosteum.  A #11 scalpel was used to make incision into the skin.  Despite 4 attempts, I could not aspirate out any liquid marrow.  We then obtained an excellent bone marrow biopsy core.  The patient tolerated the procedure well.  We just decided properly.     [name], M.D.     PRE/MEDQ  D:  01/11/2011  T:  01/11/2011  Job:  [id number]  Electronically Signed by Burney Gauze  on 02/02/2011 05:59:36 PM

## 2011-02-11 NOTE — Progress Notes (Signed)
Only here for chemo not MD visit

## 2011-02-15 ENCOUNTER — Telehealth: Payer: Self-pay | Admitting: *Deleted

## 2011-02-15 NOTE — Telephone Encounter (Signed)
Pt left message on voicemail stating he was in need of something for sleep. He was taking Lorazepam that was prescribed for nausea/anxiety/sleep (which was helping but is now out of) but would like something that is specifically for sleep. Will have Virgina Organ, NP review the request.

## 2011-02-15 NOTE — Telephone Encounter (Signed)
I have never seen this pt. Deferred to Dr. Marin Olp.

## 2011-02-15 NOTE — Telephone Encounter (Signed)
I have never seen this pt. Defer to Dr. Marin Olp.

## 2011-02-16 ENCOUNTER — Other Ambulatory Visit: Payer: PRIVATE HEALTH INSURANCE

## 2011-02-16 ENCOUNTER — Other Ambulatory Visit: Payer: Self-pay | Admitting: *Deleted

## 2011-02-16 ENCOUNTER — Other Ambulatory Visit: Payer: Self-pay | Admitting: Hematology & Oncology

## 2011-02-16 DIAGNOSIS — G4701 Insomnia due to medical condition: Secondary | ICD-10-CM

## 2011-02-16 MED ORDER — ZOLPIDEM TARTRATE 10 MG PO TABS: 10.0000 mg | ORAL_TABLET | Freq: Every evening | ORAL | Status: DC | PRN

## 2011-02-18 ENCOUNTER — Ambulatory Visit: Payer: PRIVATE HEALTH INSURANCE

## 2011-02-18 ENCOUNTER — Telehealth: Payer: Self-pay | Admitting: *Deleted

## 2011-02-18 ENCOUNTER — Other Ambulatory Visit: Payer: Self-pay | Admitting: Pharmacist

## 2011-02-18 ENCOUNTER — Ambulatory Visit (HOSPITAL_BASED_OUTPATIENT_CLINIC_OR_DEPARTMENT_OTHER): Payer: BC Managed Care – PPO | Admitting: Lab

## 2011-02-18 ENCOUNTER — Ambulatory Visit (HOSPITAL_BASED_OUTPATIENT_CLINIC_OR_DEPARTMENT_OTHER): Payer: BC Managed Care – PPO | Admitting: Hematology & Oncology

## 2011-02-18 ENCOUNTER — Other Ambulatory Visit: Payer: Self-pay | Admitting: Hematology & Oncology

## 2011-02-18 VITALS — BP 118/74 | HR 61 | Temp 97.6°F

## 2011-02-18 VITALS — BP 142/84 | HR 74 | Temp 99.0°F | Ht 71.0 in | Wt 216.0 lb

## 2011-02-18 DIAGNOSIS — Z5111 Encounter for antineoplastic chemotherapy: Secondary | ICD-10-CM

## 2011-02-18 DIAGNOSIS — Z5112 Encounter for antineoplastic immunotherapy: Secondary | ICD-10-CM

## 2011-02-18 DIAGNOSIS — D72820 Lymphocytosis (symptomatic): Secondary | ICD-10-CM

## 2011-02-18 DIAGNOSIS — C8589 Other specified types of non-Hodgkin lymphoma, extranodal and solid organ sites: Secondary | ICD-10-CM

## 2011-02-18 DIAGNOSIS — C8583 Other specified types of non-Hodgkin lymphoma, intra-abdominal lymph nodes: Secondary | ICD-10-CM

## 2011-02-18 DIAGNOSIS — C8298 Follicular lymphoma, unspecified, lymph nodes of multiple sites: Secondary | ICD-10-CM

## 2011-02-18 LAB — CBC WITH DIFFERENTIAL (CANCER CENTER ONLY)
BASO%: 1 % (ref 0.0–2.0)
EOS%: 1 % (ref 0.0–7.0)
HGB: 12.5 g/dL — ABNORMAL LOW (ref 13.0–17.1)
LYMPH#: 1.6 10*3/uL (ref 0.9–3.3)
MCH: 29.6 pg (ref 28.0–33.4)
MCHC: 34.2 g/dL (ref 32.0–35.9)
MONO%: 11.5 % (ref 0.0–13.0)
NEUT#: 1.9 10*3/uL (ref 1.5–6.5)
Platelets: 111 10*3/uL — ABNORMAL LOW (ref 145–400)
RDW: 13.6 % (ref 11.1–15.7)
WBC: 4 10*3/uL (ref 4.0–10.0)

## 2011-02-18 LAB — COMPREHENSIVE METABOLIC PANEL
ALT: 14 U/L (ref 0–53)
AST: 21 U/L (ref 0–37)
Alkaline Phosphatase: 56 U/L (ref 39–117)
BUN: 23 mg/dL (ref 6–23)
Calcium: 9.1 mg/dL (ref 8.4–10.5)
Chloride: 105 mEq/L (ref 96–112)
Creatinine, Ser: 1.08 mg/dL (ref 0.50–1.35)
Total Bilirubin: 0.4 mg/dL (ref 0.3–1.2)

## 2011-02-18 LAB — CHCC SATELLITE - SMEAR

## 2011-02-18 MED ORDER — VINCRISTINE SULFATE CHEMO INJECTION 1 MG/ML
2.0000 mg | Freq: Once | INTRAVENOUS | Status: AC
  Administered 2011-02-18: 2 mg via INTRAVENOUS
  Filled 2011-02-18: qty 2

## 2011-02-18 MED ORDER — ONDANSETRON 16 MG/50ML IVPB (CHCC)
16.0000 mg | Freq: Once | INTRAVENOUS | Status: AC
  Administered 2011-02-18: 16 mg via INTRAVENOUS
  Filled 2011-02-18: qty 16

## 2011-02-18 MED ORDER — ACETAMINOPHEN 325 MG PO TABS
650.0000 mg | ORAL_TABLET | Freq: Once | ORAL | Status: AC
  Administered 2011-02-18: 650 mg via ORAL

## 2011-02-18 MED ORDER — DEXAMETHASONE SODIUM PHOSPHATE 4 MG/ML IJ SOLN
20.0000 mg | Freq: Once | INTRAMUSCULAR | Status: AC
  Administered 2011-02-18: 20 mg via INTRAVENOUS

## 2011-02-18 MED ORDER — SODIUM CHLORIDE 0.9 % IV SOLN: 375.0000 mg/m2 | Freq: Once | INTRAVENOUS | Status: DC

## 2011-02-18 MED ORDER — PREDNISONE 20 MG PO TABS
20.0000 mg | ORAL_TABLET | Freq: Three times a day (TID) | ORAL | Status: DC
  Filled 2011-02-18: qty 1

## 2011-02-18 MED ORDER — LORAZEPAM 0.5 MG PO TABS: 0.5000 mg | ORAL_TABLET | Freq: Four times a day (QID) | ORAL | Status: DC | PRN

## 2011-02-18 MED ORDER — SODIUM CHLORIDE 0.9 % IV SOLN
800.0000 mg/m2 | Freq: Once | INTRAVENOUS | Status: AC
  Administered 2011-02-18: 1760 mg via INTRAVENOUS
  Filled 2011-02-18: qty 88

## 2011-02-18 MED ORDER — SODIUM CHLORIDE 0.9 % IV SOLN
375.0000 mg/m2 | Freq: Once | INTRAVENOUS | Status: AC
  Administered 2011-02-18: 800 mg via INTRAVENOUS
  Filled 2011-02-18: qty 80

## 2011-02-18 MED ORDER — SODIUM CHLORIDE 0.9 % IV SOLN
Freq: Once | INTRAVENOUS | Status: AC
  Administered 2011-02-18: 09:00:00 via INTRAVENOUS

## 2011-02-18 MED ORDER — DIPHENHYDRAMINE HCL 25 MG PO CAPS
50.0000 mg | ORAL_CAPSULE | Freq: Once | ORAL | Status: AC
  Administered 2011-02-18: 50 mg via ORAL

## 2011-02-18 NOTE — Telephone Encounter (Signed)
Called patients wife to verify that Prednisone RX went through.  It did not per wife.  Will follow up

## 2011-02-18 NOTE — Progress Notes (Signed)
CC:   [name] Penna. Hopper, MD,FACP,FCCP  DIAGNOSIS:  Marginal zone lymphoma.  CURRENT THERAPY:  The patient is status post cycle 1 of R-CVP.  INTERIM HISTORY:  [name] [name] comes in for A followup.  He tolerated 1st cycle of chemotherapy well.  He had few side effects.  There was a little nausea.  He had a little constipation.  He does state occasional discomfort over on the left upper quadrant.  He has had no cough.  There has been no mouth sores.  He has not noticed any rashes.  He has had no fever or sweats.  PHYSICAL EXAM:  General: This is a well-developed, well-nourished, white gentleman in no obvious distress.  Vital signs show temperature of 99, pulse 74, respiratory 18, blood pressure 142/84, and weight is 216. Head and neck exam shows a normocephalic, atraumatic skull.  There are no ocular or oral lesions.  There are no palpable cervical or supraclavicular lymph nodes.  Lungs are clear bilaterally.  Cardiac exam: Regular rate and rhythm with a normal S1 and S2.  There are no murmurs, rubs, or bruits.  Axillary exam shows no bilateral axillary adenopathy.  Extremities shows no clubbing, cyanosis, or edema. Neurological exam shows no focal neurological deficits.  Skin exam: No rashes, ecchymoses, or petechia.  LABORATORY STUDIES:  White cell count is 4, hemoglobin 12.5, hematocrit 36.5, and platelet count 111.  White cell differential shows 46 segs and 40 lymphocytes.  IMPRESSION:  [name] [name] is a 60 year old gentleman with a marginal zone lymphoma.  We have him on systemic chemotherapy.  He did have his bone marrow biopsy and aspirate done.  This was on the 23rd.  The pathology report [id number]) showed involvement by non- Hodgkin lymphoma.  The bone marrow was normal cellular.  Cytogenetics on the bone marrow did not show any abnormal chromosomes.  We will go ahead and plan for cycle #2 of treatment.  We will probably rescan after 4 cycles.  I suspect that all we should  need is 6 cycles.  We will get [name] [name] back in 3 weeks for his next treatment.    ______________________________ [name], M.D. PRE/MEDQ  D:  02/18/2011  T:  02/18/2011  Job:  [id number]

## 2011-02-18 NOTE — Progress Notes (Signed)
This office note has been dictated.

## 2011-02-19 HISTORY — PX: PORTACATH PLACEMENT: SHX2246

## 2011-02-21 ENCOUNTER — Other Ambulatory Visit: Payer: Self-pay | Admitting: Hematology & Oncology

## 2011-02-22 ENCOUNTER — Other Ambulatory Visit: Payer: Self-pay | Admitting: Radiology

## 2011-02-23 ENCOUNTER — Encounter (HOSPITAL_COMMUNITY): Payer: Self-pay | Admitting: Pharmacy Technician

## 2011-02-25 ENCOUNTER — Other Ambulatory Visit: Payer: Self-pay | Admitting: Hematology & Oncology

## 2011-02-25 ENCOUNTER — Inpatient Hospital Stay (HOSPITAL_COMMUNITY): Admission: RE | Admit: 2011-02-25 | Payer: BC Managed Care – PPO | Source: Ambulatory Visit

## 2011-02-25 ENCOUNTER — Ambulatory Visit (HOSPITAL_COMMUNITY)
Admission: RE | Admit: 2011-02-25 | Discharge: 2011-02-25 | Disposition: A | Discharge status: Home / Self Care | Payer: BC Managed Care – PPO | Source: Ambulatory Visit | Attending: Hematology & Oncology | Admitting: Hematology & Oncology

## 2011-02-25 DIAGNOSIS — E039 Hypothyroidism, unspecified: Secondary | ICD-10-CM | POA: Insufficient documentation

## 2011-02-25 DIAGNOSIS — C8583 Other specified types of non-Hodgkin lymphoma, intra-abdominal lymph nodes: Secondary | ICD-10-CM | POA: Insufficient documentation

## 2011-02-25 DIAGNOSIS — Z79899 Other long term (current) drug therapy: Secondary | ICD-10-CM | POA: Insufficient documentation

## 2011-02-25 DIAGNOSIS — Z7982 Long term (current) use of aspirin: Secondary | ICD-10-CM | POA: Insufficient documentation

## 2011-02-25 DIAGNOSIS — E785 Hyperlipidemia, unspecified: Secondary | ICD-10-CM | POA: Insufficient documentation

## 2011-02-25 MED ORDER — MIDAZOLAM HCL 5 MG/5ML IJ SOLN
INTRAMUSCULAR | Status: AC | PRN
  Administered 2011-02-25: 2 mg via INTRAVENOUS

## 2011-02-25 MED ORDER — LIDOCAINE-EPINEPHRINE 2 %-1:100000 IJ SOLN
INTRAMUSCULAR | Status: AC
  Filled 2011-02-25: qty 1

## 2011-02-25 MED ORDER — MIDAZOLAM HCL 2 MG/2ML IJ SOLN
INTRAMUSCULAR | Status: AC
  Filled 2011-02-25: qty 2

## 2011-02-25 MED ORDER — HEPARIN SOD (PORK) LOCK FLUSH 100 UNIT/ML IV SOLN
500.0000 [IU] | Freq: Once | INTRAVENOUS | Status: AC
  Administered 2011-02-25: 500 [IU] via INTRAVENOUS

## 2011-02-25 MED ORDER — LIDOCAINE HCL 1 % IJ SOLN
INTRAMUSCULAR | Status: AC
  Filled 2011-02-25: qty 20

## 2011-02-25 MED ORDER — FENTANYL CITRATE 0.05 MG/ML IJ SOLN
INTRAMUSCULAR | Status: AC
  Filled 2011-02-25: qty 4

## 2011-02-25 MED ORDER — SODIUM CHLORIDE 0.9 % IV SOLN: INTRAVENOUS | Status: DC

## 2011-02-25 MED ORDER — FENTANYL CITRATE 0.05 MG/ML IJ SOLN
INTRAMUSCULAR | Status: AC | PRN
  Administered 2011-02-25 (×2): 50 ug via INTRAVENOUS
  Administered 2011-02-25: 100 ug via INTRAVENOUS

## 2011-02-25 MED ORDER — CEFAZOLIN SODIUM 1-5 GM-% IV SOLN
1.0000 g | Freq: Once | INTRAVENOUS | Status: AC
  Administered 2011-02-25: 1 g via INTRAVENOUS
  Filled 2011-02-25: qty 50

## 2011-02-25 NOTE — H&P (Signed)
[name] [name] is an 60 y.o. male.   Chief Complaint: Pt. With lymphoma - here for portacath placement.  HPI: Pleasant 60 yo male - pt. Of Dr. Antonieta Pert with history of lymphoma, undergoing chemotherapy - difficult IV access portacath ordered.  Past Medical History  Diagnosis Date  . Hyperlipidemia   . Thyroid disease 2007    hypothyroidism  . 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 Colonoscopy 2008    Dr Fuller Plan, due 2018  . Tonsillectomy     Family History  Problem Relation Age of Onset  . Alzheimer's disease Mother   . Heart attack Father 27  . Diabetes Brother   . Hypertension Brother      X 3   Social History:  reports that he has never smoked. He does not have any smokeless tobacco history on file. He reports that he drinks about 2.4 ounces of alcohol per week. He reports that he does not use illicit drugs.  Allergies: No Known Allergies  Medications Prior to Admission  Medication Sig Dispense Refill  . aspirin 81 MG tablet Take 81 mg by mouth at bedtime.       Marland Kitchen levothyroxine (SYNTHROID, LEVOTHROID) 75 MCG tablet Take 75-112.5 mcg by mouth as directed. 1 TABLET EVERYDAY BUT 1&1/2 TABLET ON Tues & Thursday        . LORazepam (ATIVAN) 0.5 MG tablet Take 1 tablet (0.5 mg total) by mouth every 6 (six) hours as needed (Nausea or vomiting).  30 tablet  0  . ondansetron (ZOFRAN) 8 MG tablet Take 1 tablet two times a day starting the day after chemo for 3 days. Then take 1 tablet two times a day as needed for nausea or vomiting.   30 tablet  1  . pravastatin (PRAVACHOL) 40 MG tablet Take 40 mg by mouth at bedtime.        . predniSONE (DELTASONE) 20 MG tablet Take 20 mg by mouth daily.        . prochlorperazine (COMPAZINE) 10 MG tablet Take 1 tablet (10 mg total) by mouth every 6 (six) hours as needed (Nausea or vomiting).  30 tablet  1  . prochlorperazine (COMPAZINE) 25 MG suppository Place 1 suppository (25  mg total) rectally every 12 (twelve) hours as needed for nausea.  12 suppository  3  . zolpidem (AMBIEN) 10 MG tablet Take 1 tablet (10 mg total) by mouth at bedtime as needed for sleep.  30 tablet  0   Medications Prior to Admission  Medication Dose Route Frequency Provider Last Rate Last Dose  . 0.9 %  sodium chloride infusion   Intravenous Continuous Lavonia Drafts, PA      . ceFAZolin (ANCEF) IVPB 1 g/50 mL premix  1 g Intravenous Once Lavonia Drafts, PA      . fentaNYL (SUBLIMAZE) 0.05 MG/ML injection           . midazolam (VERSED) 2 MG/2ML injection             No results found for this or any previous visit (from the past 48 hour(s)). No results found.  Review of Systems  Constitutional: Positive for weight loss. Negative for fever and chills.  Respiratory: Negative for shortness of breath.   Cardiovascular: Negative for chest pain.  Musculoskeletal: Positive for myalgias.       Occasional left upper flank pain.  Endo/Heme/Allergies: Does not bruise/bleed easily.    Blood  pressure 129/80, pulse 63, SpO2 97.00%. Physical Exam  Heent - unremarkable - airway - 1 Heart - RRR Lungs - Clear Abd - soft - non tender  Assessment/Plan Informed consent obtained for portacath placement today Dr. Anselm Pancoast.  [name] [name] 02/25/2011, 8:11 AM

## 2011-02-25 NOTE — Procedures (Signed)
Placement of right IJ portacath.  Tip in lower SVC.  Ready to use.

## 2011-02-25 NOTE — ED Notes (Signed)
Patient denies pain and is resting comfortably.  

## 2011-02-25 NOTE — H&P (Signed)
Agree with PA note. 

## 2011-03-11 ENCOUNTER — Ambulatory Visit (HOSPITAL_BASED_OUTPATIENT_CLINIC_OR_DEPARTMENT_OTHER): Payer: BC Managed Care – PPO | Admitting: Hematology & Oncology

## 2011-03-11 ENCOUNTER — Ambulatory Visit (HOSPITAL_BASED_OUTPATIENT_CLINIC_OR_DEPARTMENT_OTHER): Payer: BC Managed Care – PPO

## 2011-03-11 ENCOUNTER — Other Ambulatory Visit: Payer: Self-pay | Admitting: *Deleted

## 2011-03-11 ENCOUNTER — Other Ambulatory Visit: Payer: Self-pay | Admitting: Hematology & Oncology

## 2011-03-11 ENCOUNTER — Other Ambulatory Visit (HOSPITAL_BASED_OUTPATIENT_CLINIC_OR_DEPARTMENT_OTHER): Payer: BC Managed Care – PPO | Admitting: Lab

## 2011-03-11 VITALS — BP 116/76 | HR 82 | Temp 96.2°F

## 2011-03-11 VITALS — BP 130/83 | HR 73 | Temp 98.4°F | Ht 71.0 in | Wt 214.5 lb

## 2011-03-11 DIAGNOSIS — C8583 Other specified types of non-Hodgkin lymphoma, intra-abdominal lymph nodes: Secondary | ICD-10-CM

## 2011-03-11 DIAGNOSIS — C8298 Follicular lymphoma, unspecified, lymph nodes of multiple sites: Secondary | ICD-10-CM

## 2011-03-11 DIAGNOSIS — Z5111 Encounter for antineoplastic chemotherapy: Secondary | ICD-10-CM

## 2011-03-11 DIAGNOSIS — C8581 Other specified types of non-Hodgkin lymphoma, lymph nodes of head, face, and neck: Secondary | ICD-10-CM

## 2011-03-11 DIAGNOSIS — C8589 Other specified types of non-Hodgkin lymphoma, extranodal and solid organ sites: Secondary | ICD-10-CM

## 2011-03-11 DIAGNOSIS — Z5112 Encounter for antineoplastic immunotherapy: Secondary | ICD-10-CM

## 2011-03-11 LAB — CBC WITH DIFFERENTIAL (CANCER CENTER ONLY)
BASO#: 0 10*3/uL (ref 0.0–0.2)
Eosinophils Absolute: 0 10*3/uL (ref 0.0–0.5)
HGB: 12.5 g/dL — ABNORMAL LOW (ref 13.0–17.1)
LYMPH#: 1.5 10*3/uL (ref 0.9–3.3)
MCH: 29.6 pg (ref 28.0–33.4)
MONO#: 0.5 10*3/uL (ref 0.1–0.9)
NEUT#: 1.8 10*3/uL (ref 1.5–6.5)
RBC: 4.23 10*6/uL (ref 4.20–5.70)
WBC: 3.8 10*3/uL — ABNORMAL LOW (ref 4.0–10.0)

## 2011-03-11 LAB — COMPREHENSIVE METABOLIC PANEL
Albumin: 4 g/dL (ref 3.5–5.2)
BUN: 19 mg/dL (ref 6–23)
CO2: 22 mEq/L (ref 19–32)
Calcium: 9.3 mg/dL (ref 8.4–10.5)
Chloride: 107 mEq/L (ref 96–112)
Glucose, Bld: 90 mg/dL (ref 70–99)
Potassium: 4.6 mEq/L (ref 3.5–5.3)
Sodium: 141 mEq/L (ref 135–145)
Total Protein: 6.6 g/dL (ref 6.0–8.3)

## 2011-03-11 MED ORDER — VINCRISTINE SULFATE CHEMO INJECTION 1 MG/ML
2.0000 mg | Freq: Once | INTRAVENOUS | Status: AC
  Administered 2011-03-11: 2 mg via INTRAVENOUS
  Filled 2011-03-11: qty 2

## 2011-03-11 MED ORDER — SODIUM CHLORIDE 0.9 % IV SOLN
800.0000 mg/m2 | Freq: Once | INTRAVENOUS | Status: AC
  Administered 2011-03-11: 1760 mg via INTRAVENOUS
  Filled 2011-03-11: qty 88

## 2011-03-11 MED ORDER — SODIUM CHLORIDE 0.9 % IV SOLN
375.0000 mg/m2 | Freq: Once | INTRAVENOUS | Status: AC
  Administered 2011-03-11: 800 mg via INTRAVENOUS
  Filled 2011-03-11: qty 80

## 2011-03-11 MED ORDER — PREDNISONE 20 MG PO TABS: ORAL_TABLET | ORAL | Status: DC

## 2011-03-11 MED ORDER — DEXAMETHASONE SODIUM PHOSPHATE 4 MG/ML IJ SOLN
20.0000 mg | Freq: Once | INTRAMUSCULAR | Status: AC
  Administered 2011-03-11: 20 mg via INTRAVENOUS

## 2011-03-11 MED ORDER — SODIUM CHLORIDE 0.9 % IV SOLN
Freq: Once | INTRAVENOUS | Status: AC
  Administered 2011-03-11: 10:00:00 via INTRAVENOUS

## 2011-03-11 MED ORDER — ACETAMINOPHEN 325 MG PO TABS
650.0000 mg | ORAL_TABLET | Freq: Once | ORAL | Status: AC
  Administered 2011-03-11: 650 mg via ORAL

## 2011-03-11 MED ORDER — DIPHENHYDRAMINE HCL 25 MG PO CAPS
50.0000 mg | ORAL_CAPSULE | Freq: Once | ORAL | Status: AC
  Administered 2011-03-11: 50 mg via ORAL

## 2011-03-11 MED ORDER — SODIUM CHLORIDE 0.9 % IJ SOLN
3.0000 mL | INTRAMUSCULAR | Status: DC | PRN
  Filled 2011-03-11: qty 10

## 2011-03-11 MED ORDER — SODIUM CHLORIDE 0.9 % IJ SOLN
10.0000 mL | INTRAMUSCULAR | Status: DC | PRN
  Administered 2011-03-11: 10 mL
  Filled 2011-03-11: qty 10

## 2011-03-11 MED ORDER — LIDOCAINE-PRILOCAINE 2.5-2.5 % EX CREA: TOPICAL_CREAM | CUTANEOUS | Status: DC

## 2011-03-11 MED ORDER — ONDANSETRON 16 MG/50ML IVPB (CHCC)
16.0000 mg | Freq: Once | INTRAVENOUS | Status: AC
  Administered 2011-03-11: 16 mg via INTRAVENOUS
  Filled 2011-03-11: qty 16

## 2011-03-11 MED ORDER — HEPARIN SOD (PORK) LOCK FLUSH 100 UNIT/ML IV SOLN
500.0000 [IU] | Freq: Once | INTRAVENOUS | Status: AC | PRN
  Administered 2011-03-11: 500 [IU]
  Filled 2011-03-11: qty 5

## 2011-03-11 NOTE — Progress Notes (Signed)
This office note has been dictated.

## 2011-03-11 NOTE — Telephone Encounter (Signed)
Pt needs refill on Prednisone and a new rx for Emla as he has a new port.

## 2011-03-11 NOTE — Progress Notes (Signed)
CC:   [name], MD,FACP,FCCP  DIAGNOSIS:  Marginal zone lymphoma.  CURRENT THERAPY:  Status post 2 cycles of R-CVP.  INTERIM HISTORY:  [name] comes in for his followup.  He had an episode of hematochezia after his last chemotherapy.  He says he was constipated.  I suspect this may be an internal hemorrhoid.  He may have an AV malformation.  He has never had any problems with diverticulosis.  He is feeling well right now.  There may be a little bit of fatigue, but he is still able to do what he wishes.  There has been no cough.  He has had no fever.  He has had no headache.  He had a Port-A-Cath placed in his right upper chest wall without any difficulty.  He has not noticed any rashes.  There are no arthralgias or myalgias.  PHYSICAL EXAM:  General:  This is a well-developed, well-nourished white gentleman in no obvious distress.  Vital Signs:  Temperature of 98.4, pulse 73, respiratory rate 18, blood pressure 130/83.  Weight is 214. Head/Neck:  Exam shows a normocephalic, atraumatic skull.  There are no ocular or oral lesions.  There are no palpable cervical, supraclavicular lymph nodes.  Lungs:  Clear bilaterally.  Cardiac:  Regular rate and rhythm with a normal S1, S2.  There are no murmurs, rubs or bruits. Abdomen:  Soft with good bowel sounds.  There is no palpable abdominal mass.  There is no fluid wave.  There is no palpable hepatosplenomegaly. Axillae:  Axillary exam shows no bilateral axillary adenopathy. Extremities:  No clubbing, cyanosis or edema.  Neurologic:  Exam shows no focal neurological deficit.  LABORATORY STUDIES:  White cell count is 3.8, hemoglobin 12.5, hematocrit 37, platelet count 115.  White cell differential shows 47 segs, 38 lymphs, 13 monos.  IMPRESSION:  [name] is a 60 year old gentleman with marginal zone lymphoma.  We are giving him systemic chemotherapy.  He is responding. His lymphocytes were coming down nicely.  We will go  ahead and plan for his first cycle today.  After his 4th cycle in 3 weeks, we will repeat his scans and see how things stand.  I do not see the need for a bone marrow test until after we complete all of his chemotherapy.   ______________________________ [name], M.D. PRE/MEDQ  D:  03/11/2011  T:  03/11/2011  Job:  [id number]

## 2011-03-12 LAB — VITAMIN D 25 HYDROXY (VIT D DEFICIENCY, FRACTURES): Vit D, 25-Hydroxy: 27 ng/mL — ABNORMAL LOW (ref 30–89)

## 2011-03-12 LAB — COMPREHENSIVE METABOLIC PANEL
AST: 22 U/L (ref 0–37)
Albumin: 4 g/dL (ref 3.5–5.2)
Alkaline Phosphatase: 49 U/L (ref 39–117)
Potassium: 4.6 mEq/L (ref 3.5–5.3)
Sodium: 141 mEq/L (ref 135–145)
Total Bilirubin: 0.4 mg/dL (ref 0.3–1.2)
Total Protein: 6.6 g/dL (ref 6.0–8.3)

## 2011-03-14 LAB — HEPATITIS PANEL, ACUTE
HCV Ab: NEGATIVE
Hep A IgM: NEGATIVE
Hepatitis B Surface Ag: NEGATIVE

## 2011-04-01 ENCOUNTER — Ambulatory Visit: Payer: BC Managed Care – PPO | Admitting: Hematology & Oncology

## 2011-04-01 ENCOUNTER — Ambulatory Visit (HOSPITAL_BASED_OUTPATIENT_CLINIC_OR_DEPARTMENT_OTHER): Payer: BC Managed Care – PPO

## 2011-04-01 ENCOUNTER — Other Ambulatory Visit: Payer: Self-pay | Admitting: *Deleted

## 2011-04-01 ENCOUNTER — Other Ambulatory Visit (HOSPITAL_BASED_OUTPATIENT_CLINIC_OR_DEPARTMENT_OTHER): Payer: BC Managed Care – PPO | Admitting: Lab

## 2011-04-01 DIAGNOSIS — C8590 Non-Hodgkin lymphoma, unspecified, unspecified site: Secondary | ICD-10-CM

## 2011-04-01 DIAGNOSIS — C8298 Follicular lymphoma, unspecified, lymph nodes of multiple sites: Secondary | ICD-10-CM

## 2011-04-01 DIAGNOSIS — Z5111 Encounter for antineoplastic chemotherapy: Secondary | ICD-10-CM

## 2011-04-01 DIAGNOSIS — C8589 Other specified types of non-Hodgkin lymphoma, extranodal and solid organ sites: Secondary | ICD-10-CM

## 2011-04-01 DIAGNOSIS — C8581 Other specified types of non-Hodgkin lymphoma, lymph nodes of head, face, and neck: Secondary | ICD-10-CM

## 2011-04-01 DIAGNOSIS — R5381 Other malaise: Secondary | ICD-10-CM

## 2011-04-01 DIAGNOSIS — Z5112 Encounter for antineoplastic immunotherapy: Secondary | ICD-10-CM

## 2011-04-01 LAB — CBC WITH DIFFERENTIAL (CANCER CENTER ONLY)
BASO%: 0.5 % (ref 0.0–2.0)
EOS%: 1.3 % (ref 0.0–7.0)
HCT: 36.2 % — ABNORMAL LOW (ref 38.7–49.9)
HGB: 12.1 g/dL — ABNORMAL LOW (ref 13.0–17.1)
LYMPH#: 1.3 10*3/uL (ref 0.9–3.3)
MCHC: 33.4 g/dL (ref 32.0–35.9)
MONO#: 0.5 10*3/uL (ref 0.1–0.9)
NEUT#: 2.1 10*3/uL (ref 1.5–6.5)
NEUT%: 53.4 % (ref 40.0–80.0)
RDW: 14.9 % (ref 11.1–15.7)
WBC: 3.9 10*3/uL — ABNORMAL LOW (ref 4.0–10.0)

## 2011-04-01 MED ORDER — SODIUM CHLORIDE 0.9 % IV SOLN
375.0000 mg/m2 | Freq: Once | INTRAVENOUS | Status: AC
  Administered 2011-04-01: 800 mg via INTRAVENOUS
  Filled 2011-04-01: qty 80

## 2011-04-01 MED ORDER — SODIUM CHLORIDE 0.9 % IV SOLN
Freq: Once | INTRAVENOUS | Status: AC
  Administered 2011-04-01: 10:00:00 via INTRAVENOUS

## 2011-04-01 MED ORDER — SODIUM CHLORIDE 0.9 % IJ SOLN
10.0000 mL | INTRAMUSCULAR | Status: DC | PRN
  Administered 2011-04-01: 10 mL
  Filled 2011-04-01: qty 10

## 2011-04-01 MED ORDER — PREDNISONE 20 MG PO TABS: ORAL_TABLET | ORAL | Status: DC

## 2011-04-01 MED ORDER — ONDANSETRON 16 MG/50ML IVPB (CHCC)
16.0000 mg | Freq: Once | INTRAVENOUS | Status: AC
  Administered 2011-04-01: 16 mg via INTRAVENOUS
  Filled 2011-04-01: qty 16

## 2011-04-01 MED ORDER — VINCRISTINE SULFATE CHEMO INJECTION 1 MG/ML
2.0000 mg | Freq: Once | INTRAVENOUS | Status: AC
  Administered 2011-04-01: 2 mg via INTRAVENOUS
  Filled 2011-04-01: qty 2

## 2011-04-01 MED ORDER — ACETAMINOPHEN 325 MG PO TABS
650.0000 mg | ORAL_TABLET | Freq: Once | ORAL | Status: AC
  Administered 2011-04-01: 650 mg via ORAL

## 2011-04-01 MED ORDER — SODIUM CHLORIDE 0.9 % IV SOLN
800.0000 mg/m2 | Freq: Once | INTRAVENOUS | Status: AC
  Administered 2011-04-01: 1760 mg via INTRAVENOUS
  Filled 2011-04-01: qty 88

## 2011-04-01 MED ORDER — DIPHENHYDRAMINE HCL 25 MG PO CAPS
50.0000 mg | ORAL_CAPSULE | Freq: Once | ORAL | Status: AC
  Administered 2011-04-01: 50 mg via ORAL

## 2011-04-01 MED ORDER — HEPARIN SOD (PORK) LOCK FLUSH 100 UNIT/ML IV SOLN
500.0000 [IU] | Freq: Once | INTRAVENOUS | Status: AC | PRN
  Administered 2011-04-01: 500 [IU]
  Filled 2011-04-01: qty 5

## 2011-04-01 MED ORDER — DEXAMETHASONE SODIUM PHOSPHATE 4 MG/ML IJ SOLN
20.0000 mg | Freq: Once | INTRAMUSCULAR | Status: AC
  Administered 2011-04-01: 20 mg via INTRAVENOUS

## 2011-04-01 MED ORDER — ZOLPIDEM TARTRATE 10 MG PO TABS: 10.0000 mg | ORAL_TABLET | Freq: Every evening | ORAL | Status: DC | PRN

## 2011-04-01 NOTE — Progress Notes (Signed)
This office note has been dictated.

## 2011-04-01 NOTE — Progress Notes (Signed)
Addended by: Burney Gauze R on: 04/01/2011 01:43 PM   Modules accepted: Orders

## 2011-04-01 NOTE — Patient Instructions (Signed)
[facility] Discharge Instructions for Patients Receiving Chemotherapy  Today you received the following chemotherapy agents Rituxan, Cytoxan, Vincristine  To help prevent nausea and vomiting after your treatment, we encourage you to take your nausea medication    If you develop nausea and vomiting that is not controlled by your nausea medication, call the clinic. If it is after clinic hours your family physician or the after hours number for the clinic or go to the Emergency Department.   BELOW ARE SYMPTOMS THAT SHOULD BE REPORTED IMMEDIATELY:  *FEVER GREATER THAN 100.5 F  *CHILLS WITH OR WITHOUT FEVER  NAUSEA AND VOMITING THAT IS NOT CONTROLLED WITH YOUR NAUSEA MEDICATION  *UNUSUAL SHORTNESS OF BREATH  *UNUSUAL BRUISING OR BLEEDING  TENDERNESS IN MOUTH AND THROAT WITH OR WITHOUT PRESENCE OF ULCERS  *URINARY PROBLEMS  *BOWEL PROBLEMS  UNUSUAL RASH Items with * indicate a potential emergency and should be followed up as soon as possible.  One of the nurses will contact you 24 hours after your treatment. Please let the nurse know about any problems that you may have experienced. Feel free to call the clinic you have any questions or concerns. The clinic phone number is [phone].   I have been informed and understand all the instructions given to me. I know to contact the clinic, my physician, or go to the Emergency Department if any problems should occur. I do not have any questions at this time, but understand that I may call the clinic during office hours   should I have any questions or need assistance in obtaining follow up care.    __________________________________________  _____________  __________ Signature of Patient or Authorized Representative            Date                   Time    __________________________________________ Nurse's Signature

## 2011-04-02 NOTE — Progress Notes (Signed)
CC:   [name], MD,FACP,FCCP  DIAGNOSIS:  Marginal zone lymphoma.  CURRENT THERAPY:  Status post 3 cycles of R-CVP.  INTERIM HISTORY:  [name] comes in for followup.  He is doing okay. He says that about 7 days after chemo, he has a lot of pain in his axilla and lower chest/upper abdomen.  This seems to go away on its own.  He has not had any further bleeding from the rectum.  He has had some constipation, but is taking medication for this.  He has had no fever.  He has had no mouth sores.  He has had some fatigue.  He and his wife had a good Christmas holiday.  He had no problems with his appetite.  He has not noticed any kind of rashes.  Overall, his performance status is an ECOG 0.  PHYSICAL EXAMINATION:  General:  This is a well-developed, well- nourished white gentleman in no obvious distress.  Vital Signs: Temperature 97.2, pulse 71, respiratory rate 14, blood pressure 135/82, weight is 217.  Head and Neck Exam:  Shows a normocephalic, atraumatic skull.  There are no ocular or oral lesions.  There are no palpable cervical or supraclavicular lymph nodes.  Lungs:  Clear bilaterally. Cardiac Exam:  Regular rate and rhythm with a normal S1 and S2.  There are no murmurs, rubs, or bruits.  Abdominal Exam:  Soft with good bowel sounds.  There is no palpable abdominal mass.  There is no fluid wave. There is no palpable hepatosplenomegaly.  Back Exam:  No tenderness over the spine, ribs, or hips.  Extremities:  Show no clubbing, cyanosis, or edema.  He has good range motion of his joints.  Skin Exam:  No rashes, ecchymoses, or petechiae.  Neurological Exam:  Shows no focal neurological deficits.  LABORATORY STUDIES:  White cell count is 3.9, hemoglobin 12.1, hematocrit 36.2, platelet count 126.  White cell differential shows 53 segs, 32 lymphocytes, and 12 monos.  IMPRESSION:  [name] is a 61 year old gentleman with marginal zone lymphoma.  He is doing well with  this.  He has responded as expected.  We will go ahead and plan to give him his 4th cycle of chemo today.  I will then followup with a CT scan in about 2 weeks.  Hopefully, we will just only have to treat for 6 cycles.  I am fairly confident that is all that he would need.  We will have to do a bone marrow test on him once we get done with chemotherapy, as he did have bone marrow involvement at the outset.  We will see [name] back in another 3 weeks.    ______________________________ [name], M.D. PRE/MEDQ  D:  04/01/2011  T:  04/02/2011  Job:  [id number]

## 2011-04-18 ENCOUNTER — Ambulatory Visit (HOSPITAL_BASED_OUTPATIENT_CLINIC_OR_DEPARTMENT_OTHER): Payer: BC Managed Care – PPO

## 2011-04-18 ENCOUNTER — Ambulatory Visit (INDEPENDENT_AMBULATORY_CARE_PROVIDER_SITE_OTHER)
Admission: RE | Admit: 2011-04-18 | Discharge: 2011-04-18 | Disposition: A | Discharge status: Home / Self Care | Payer: BC Managed Care – PPO | Source: Ambulatory Visit | Attending: Hematology & Oncology | Admitting: Hematology & Oncology

## 2011-04-18 ENCOUNTER — Ambulatory Visit (HOSPITAL_BASED_OUTPATIENT_CLINIC_OR_DEPARTMENT_OTHER)
Admission: RE | Admit: 2011-04-18 | Discharge: 2011-04-18 | Disposition: A | Discharge status: Home / Self Care | Payer: BC Managed Care – PPO | Source: Ambulatory Visit | Attending: Hematology & Oncology | Admitting: Hematology & Oncology

## 2011-04-18 DIAGNOSIS — Z79899 Other long term (current) drug therapy: Secondary | ICD-10-CM

## 2011-04-18 DIAGNOSIS — C8583 Other specified types of non-Hodgkin lymphoma, intra-abdominal lymph nodes: Secondary | ICD-10-CM

## 2011-04-18 DIAGNOSIS — Z452 Encounter for adjustment and management of vascular access device: Secondary | ICD-10-CM

## 2011-04-18 DIAGNOSIS — C8589 Other specified types of non-Hodgkin lymphoma, extranodal and solid organ sites: Secondary | ICD-10-CM

## 2011-04-18 DIAGNOSIS — C9000 Multiple myeloma not having achieved remission: Secondary | ICD-10-CM

## 2011-04-18 MED ORDER — IOHEXOL 300 MG/ML  SOLN
100.0000 mL | Freq: Once | INTRAMUSCULAR | Status: AC | PRN
  Administered 2011-04-18: 100 mL via INTRAVENOUS

## 2011-04-22 ENCOUNTER — Other Ambulatory Visit (HOSPITAL_BASED_OUTPATIENT_CLINIC_OR_DEPARTMENT_OTHER): Payer: BC Managed Care – PPO | Admitting: Lab

## 2011-04-22 ENCOUNTER — Ambulatory Visit: Payer: BC Managed Care – PPO | Admitting: Hematology & Oncology

## 2011-04-22 ENCOUNTER — Other Ambulatory Visit: Payer: BC Managed Care – PPO | Admitting: Lab

## 2011-04-22 ENCOUNTER — Ambulatory Visit (HOSPITAL_BASED_OUTPATIENT_CLINIC_OR_DEPARTMENT_OTHER): Payer: BC Managed Care – PPO

## 2011-04-22 ENCOUNTER — Ambulatory Visit (HOSPITAL_BASED_OUTPATIENT_CLINIC_OR_DEPARTMENT_OTHER): Payer: BC Managed Care – PPO | Admitting: Hematology & Oncology

## 2011-04-22 VITALS — BP 124/75 | HR 63 | Temp 95.0°F

## 2011-04-22 DIAGNOSIS — Z5111 Encounter for antineoplastic chemotherapy: Secondary | ICD-10-CM

## 2011-04-22 DIAGNOSIS — Z5112 Encounter for antineoplastic immunotherapy: Secondary | ICD-10-CM

## 2011-04-22 DIAGNOSIS — R161 Splenomegaly, not elsewhere classified: Secondary | ICD-10-CM

## 2011-04-22 DIAGNOSIS — C8581 Other specified types of non-Hodgkin lymphoma, lymph nodes of head, face, and neck: Secondary | ICD-10-CM

## 2011-04-22 DIAGNOSIS — C8589 Other specified types of non-Hodgkin lymphoma, extranodal and solid organ sites: Secondary | ICD-10-CM

## 2011-04-22 DIAGNOSIS — C8590 Non-Hodgkin lymphoma, unspecified, unspecified site: Secondary | ICD-10-CM

## 2011-04-22 LAB — CBC WITH DIFFERENTIAL (CANCER CENTER ONLY)
BASO%: 0.7 % (ref 0.0–2.0)
EOS%: 1.6 % (ref 0.0–7.0)
LYMPH#: 1.5 10*3/uL (ref 0.9–3.3)
MCH: 30.3 pg (ref 28.0–33.4)
MCHC: 34.6 g/dL (ref 32.0–35.9)
MONO%: 13.6 % — ABNORMAL HIGH (ref 0.0–13.0)
NEUT#: 2.3 10*3/uL (ref 1.5–6.5)
Platelets: 136 10*3/uL — ABNORMAL LOW (ref 145–400)
RDW: 14.8 % (ref 11.1–15.7)

## 2011-04-22 MED ORDER — ONDANSETRON 16 MG/50ML IVPB (CHCC)
16.0000 mg | Freq: Once | INTRAVENOUS | Status: AC
  Administered 2011-04-22: 16 mg via INTRAVENOUS
  Filled 2011-04-22: qty 16

## 2011-04-22 MED ORDER — SODIUM CHLORIDE 0.9 % IJ SOLN
10.0000 mL | INTRAMUSCULAR | Status: DC | PRN
  Administered 2011-04-22: 10 mL
  Filled 2011-04-22: qty 10

## 2011-04-22 MED ORDER — SODIUM CHLORIDE 0.9 % IV SOLN
800.0000 mg/m2 | Freq: Once | INTRAVENOUS | Status: AC
  Administered 2011-04-22: 1760 mg via INTRAVENOUS
  Filled 2011-04-22: qty 88

## 2011-04-22 MED ORDER — DIPHENHYDRAMINE HCL 25 MG PO CAPS
50.0000 mg | ORAL_CAPSULE | Freq: Once | ORAL | Status: AC
  Administered 2011-04-22: 50 mg via ORAL

## 2011-04-22 MED ORDER — SODIUM CHLORIDE 0.9 % IV SOLN
375.0000 mg/m2 | Freq: Once | INTRAVENOUS | Status: AC
  Administered 2011-04-22: 800 mg via INTRAVENOUS
  Filled 2011-04-22: qty 80

## 2011-04-22 MED ORDER — HEPARIN SOD (PORK) LOCK FLUSH 100 UNIT/ML IV SOLN
500.0000 [IU] | Freq: Once | INTRAVENOUS | Status: AC | PRN
  Administered 2011-04-22: 500 [IU]
  Filled 2011-04-22: qty 5

## 2011-04-22 MED ORDER — SODIUM CHLORIDE 0.9 % IV SOLN
Freq: Once | INTRAVENOUS | Status: AC
  Administered 2011-04-22: 09:00:00 via INTRAVENOUS

## 2011-04-22 MED ORDER — ACETAMINOPHEN 325 MG PO TABS
650.0000 mg | ORAL_TABLET | Freq: Once | ORAL | Status: AC
  Administered 2011-04-22: 650 mg via ORAL

## 2011-04-22 MED ORDER — VINCRISTINE SULFATE CHEMO INJECTION 1 MG/ML
1.5000 mg | Freq: Once | INTRAVENOUS | Status: AC
  Administered 2011-04-22: 1.5 mg via INTRAVENOUS
  Filled 2011-04-22: qty 1.5

## 2011-04-22 MED ORDER — DEXAMETHASONE SODIUM PHOSPHATE 4 MG/ML IJ SOLN
20.0000 mg | Freq: Once | INTRAMUSCULAR | Status: AC
  Administered 2011-04-22: 20 mg via INTRAVENOUS

## 2011-04-22 NOTE — Progress Notes (Signed)
CC:   [name] [name]. Hopper, MD,FACP,FCCP  DIAGNOSIS:  Marginal zone lymphoma.  CURRENT THERAPY:  Patient is status post 4 cycles of R-CVP.  INTERIM HISTORY:  [name] [name] comes in for his followup.  He is doing quite well.  He is starting to get some cumulative effect from the chemotherapy.  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  As such, I cut the dose of vincristine back to 1.5 mg.  He does have some constipation the week after chemotherapy.  Again, this may be from the vincristine.  He takes Dulcolax for this.  We did go ahead and repeat a CT scan on him.  This was done after his 4th cycle.  The CT scan showed resolution of his splenomegaly.  There is no lymphadenopathy that was noted.  He has had a good appetite.  He is still working.  He did have 1 night sweat yesterday.  This actually may be secondary to low testosterone.  PHYSICAL EXAMINATION:  General:  This is a well-developed, well- nourished white gentleman in no obvious distress.  Vital Signs:  97.9, pulse 79, respiratory rate 16, blood pressure 117/63.  Weight is 217. Head and Neck Exam:  Shows a normocephalic, atraumatic skull.  There are no ocular or oral lesions.  He has no palpable cervical or supraclavicular lymph nodes.  Lungs:  Clear bilaterally.  Cardiac Exam: Regular rate and rhythm with normal S1 and S2.  There are no murmurs, rubs or bruits.  Abdominal Exam:  Soft with good bowel sounds.  There is no palpable abdominal mass.  There is no fluid wave.  There is no palpable hepatosplenomegaly.  Axillary Exam:  Shows no bilateral axillary adenopathy.  Extremities:  Show no clubbing, cyanosis, or edema.  He had good range of motion of his joints.  Skin Exam:  No rashes, ecchymosis or petechiae.  Neurological: Exam shows no focal neurological deficits.  LABORATORY STUDIES:  White cell count is 4.4, hemoglobin 12.1, hematocrit 35, platelet count 136, MCV is 88.  White cell differential shows 51  segs, 33 lymphs, 14 monos.  IMPRESSION:  [name] [name] is a 61 year old gentleman with marginal zone lymphoma.  He presented with splenomegaly.  He did have bone marrow involvement.  His cytogenetics were negative on the bone marrow.  I think we can get away with a total of 6 cycles of treatment.  We will go ahead and plan for a 5th cycle today.  Again, I will cut his vincristine dose back a little bit.  We will get him back in 3 weeks for his 6th and final treatment.  Whether or not to do maintenance therapy is debatable.  I will do a bone marrow biopsy on him probably about 6 weeks after his last chemotherapy, so we can get a good sense as to how his bone marrow looks.    ______________________________ [name], M.D. PRE/MEDQ  D:  04/22/2011  T:  04/22/2011  Job:  [id number]

## 2011-04-22 NOTE — Progress Notes (Signed)
This office note has been dictated.

## 2011-05-06 ENCOUNTER — Other Ambulatory Visit: Payer: Self-pay | Admitting: Hematology & Oncology

## 2011-05-13 ENCOUNTER — Telehealth: Payer: Self-pay | Admitting: Hematology & Oncology

## 2011-05-13 ENCOUNTER — Other Ambulatory Visit (HOSPITAL_BASED_OUTPATIENT_CLINIC_OR_DEPARTMENT_OTHER): Payer: BC Managed Care – PPO | Admitting: Lab

## 2011-05-13 ENCOUNTER — Ambulatory Visit (HOSPITAL_BASED_OUTPATIENT_CLINIC_OR_DEPARTMENT_OTHER): Payer: BC Managed Care – PPO

## 2011-05-13 ENCOUNTER — Ambulatory Visit (HOSPITAL_BASED_OUTPATIENT_CLINIC_OR_DEPARTMENT_OTHER): Payer: BC Managed Care – PPO | Admitting: Hematology & Oncology

## 2011-05-13 VITALS — BP 125/81 | HR 62 | Temp 98.0°F

## 2011-05-13 DIAGNOSIS — C8589 Other specified types of non-Hodgkin lymphoma, extranodal and solid organ sites: Secondary | ICD-10-CM

## 2011-05-13 DIAGNOSIS — Z5111 Encounter for antineoplastic chemotherapy: Secondary | ICD-10-CM

## 2011-05-13 DIAGNOSIS — C8590 Non-Hodgkin lymphoma, unspecified, unspecified site: Secondary | ICD-10-CM

## 2011-05-13 LAB — COMPREHENSIVE METABOLIC PANEL
AST: 19 U/L (ref 0–37)
Albumin: 4.2 g/dL (ref 3.5–5.2)
Alkaline Phosphatase: 68 U/L (ref 39–117)
Chloride: 105 mEq/L (ref 96–112)
Glucose, Bld: 87 mg/dL (ref 70–99)
Potassium: 4.5 mEq/L (ref 3.5–5.3)
Sodium: 140 mEq/L (ref 135–145)
Total Protein: 6.7 g/dL (ref 6.0–8.3)

## 2011-05-13 LAB — CBC WITH DIFFERENTIAL (CANCER CENTER ONLY)
Eosinophils Absolute: 0.1 10*3/uL (ref 0.0–0.5)
LYMPH#: 1.5 10*3/uL (ref 0.9–3.3)
MCH: 30.3 pg (ref 28.0–33.4)
MONO#: 0.7 10*3/uL (ref 0.1–0.9)
MONO%: 17.1 % — ABNORMAL HIGH (ref 0.0–13.0)
NEUT#: 1.7 10*3/uL (ref 1.5–6.5)
Platelets: 156 10*3/uL (ref 145–400)
RBC: 3.83 10*6/uL — ABNORMAL LOW (ref 4.20–5.70)
WBC: 4 10*3/uL (ref 4.0–10.0)

## 2011-05-13 LAB — TECHNOLOGIST REVIEW CHCC SATELLITE

## 2011-05-13 MED ORDER — SODIUM CHLORIDE 0.9 % IV SOLN
Freq: Once | INTRAVENOUS | Status: AC
  Administered 2011-05-13: 09:00:00 via INTRAVENOUS

## 2011-05-13 MED ORDER — ACETAMINOPHEN 325 MG PO TABS
650.0000 mg | ORAL_TABLET | Freq: Once | ORAL | Status: AC
  Administered 2011-05-13: 650 mg via ORAL

## 2011-05-13 MED ORDER — SODIUM CHLORIDE 0.9 % IV SOLN
375.0000 mg/m2 | Freq: Once | INTRAVENOUS | Status: AC
  Administered 2011-05-13: 800 mg via INTRAVENOUS
  Filled 2011-05-13: qty 80

## 2011-05-13 MED ORDER — DIPHENHYDRAMINE HCL 25 MG PO CAPS
50.0000 mg | ORAL_CAPSULE | Freq: Once | ORAL | Status: AC
  Administered 2011-05-13: 50 mg via ORAL

## 2011-05-13 MED ORDER — DEXAMETHASONE SODIUM PHOSPHATE 4 MG/ML IJ SOLN
20.0000 mg | Freq: Once | INTRAMUSCULAR | Status: AC
  Administered 2011-05-13: 20 mg via INTRAVENOUS

## 2011-05-13 MED ORDER — ONDANSETRON 16 MG/50ML IVPB (CHCC)
16.0000 mg | Freq: Once | INTRAVENOUS | Status: AC
  Administered 2011-05-13: 16 mg via INTRAVENOUS
  Filled 2011-05-13: qty 16

## 2011-05-13 MED ORDER — SODIUM CHLORIDE 0.9 % IV SOLN
800.0000 mg/m2 | Freq: Once | INTRAVENOUS | Status: AC
  Administered 2011-05-13: 1760 mg via INTRAVENOUS
  Filled 2011-05-13: qty 88

## 2011-05-13 MED ORDER — VINCRISTINE SULFATE CHEMO INJECTION 1 MG/ML
1.5000 mg | Freq: Once | INTRAVENOUS | Status: AC
  Administered 2011-05-13: 1.5 mg via INTRAVENOUS
  Filled 2011-05-13: qty 1.5

## 2011-05-13 MED ORDER — SODIUM CHLORIDE 0.9 % IJ SOLN
10.0000 mL | INTRAMUSCULAR | Status: DC | PRN
  Administered 2011-05-13: 10 mL
  Filled 2011-05-13: qty 10

## 2011-05-13 MED ORDER — HEPARIN SOD (PORK) LOCK FLUSH 100 UNIT/ML IV SOLN
500.0000 [IU] | Freq: Once | INTRAVENOUS | Status: AC | PRN
  Administered 2011-05-13: 500 [IU]
  Filled 2011-05-13: qty 5

## 2011-05-13 NOTE — Progress Notes (Signed)
CC:   [name], MD,FACP,FCCP  DIAGNOSIS:  Marginal zone lymphoma.  CURRENT THERAPY:  The patient is status post 5 cycles of R-CVP.  INTERIM HISTORY:  [name] comes in for followup.  He has done quite well.  He is now in for the last cycle of chemotherapy.  His response has been very gratifying.  He still has a little bit of tingling in his fingers.  This is not any worse than before.  We did cut his dose of vincristine back to 1.5 mg, which I think has helped.  He has not noted any fever.  He has had no change in bowel or bladder habits.  He has had no mouth sores.  He did develop a "cold sore" on his upper lip.  He has had no bleeding.  He has had no headache.  There have been no rashes.  PHYSICAL EXAMINATION:  This is a well-developed, well-nourished white gentleman in no obvious distress.  Vital signs:  Temperature 98.5, pulse 77, respiratory rate 18, blood pressure 146/97.  Weight is 215.  Head and neck:  Shows a normocephalic, atraumatic skull.  There are no ocular or oral lesions.  There are no palpable cervical or supraclavicular lymph nodes.  Lungs:  Clear to percussion and auscultation bilaterally. Cardiac:  Regular rate and rhythm with normal S1 and S2.  There are no murmurs, rubs or bruits.  Abdomen:  Soft with good bowel sounds.  There is no palpable abdominal mass.  There is no fluid wave.  There is no palpable hepatomegaly.  I cannot palpate his spleen tip with inspiration.  Extremities:  Show no clubbing, cyanosis or edema. Neurologic:  Shows no focal neurological deficits.  LABORATORY STUDIES:  White cell count 4, hemoglobin 11.6, hematocrit 35.2, platelet count 156.  IMPRESSION:  [name] is a 61 year old white gentleman with marginal zone lymphoma.  He presented with splenomegaly.  There is a bone marrow involvement.  His last CT scan did show a nice response with resolution of his splenomegaly.  There is no lymphadenopathy.  I think that we  can give him his last cycle of chemotherapy now.  I do want to repeat a bone marrow test on him in about 6 weeks' time.  I think this would really be critical so that we could have a better sense of whether or not he needs maintenance therapy.  I think that if his bone marrow is negative, then maintenance therapy really would not be that important for [place].  We will see [name] back in another couple months.  [name] has really done a great job.  He has shown a lot of determination.    ______________________________ [name], M.D. PRE/MEDQ  D:  05/13/2011  T:  05/13/2011  Job:  [id number]

## 2011-05-13 NOTE — Progress Notes (Signed)
This office note has been dictated.

## 2011-05-13 NOTE — Telephone Encounter (Signed)
Pt has instruction sheet for 4-2 BMBX and [name] in Cytology is aware

## 2011-05-13 NOTE — Patient Instructions (Signed)
[facility] Discharge Instructions for Patients Receiving Chemotherapy  Today you received the following chemotherapy agents Vincristine, Rituxan, Cytoxan To help prevent nausea and vomiting after your treatment, we encourage you to take your nausea medication    If you develop nausea and vomiting that is not controlled by your nausea medication, call the clinic. If it is after clinic hours your family physician or the after hours number for the clinic or go to the Emergency Department.   BELOW ARE SYMPTOMS THAT SHOULD BE REPORTED IMMEDIATELY:  *FEVER GREATER THAN 100.5 F  *CHILLS WITH OR WITHOUT FEVER  NAUSEA AND VOMITING THAT IS NOT CONTROLLED WITH YOUR NAUSEA MEDICATION  *UNUSUAL SHORTNESS OF BREATH  *UNUSUAL BRUISING OR BLEEDING  TENDERNESS IN MOUTH AND THROAT WITH OR WITHOUT PRESENCE OF ULCERS  *URINARY PROBLEMS  *BOWEL PROBLEMS  UNUSUAL RASH Items with * indicate a potential emergency and should be followed up as soon as possible.  One of the nurses will contact you 24 hours after your treatment. Please let the nurse know about any problems that you may have experienced. Feel free to call the clinic you have any questions or concerns. The clinic phone number is [phone].   I have been informed and understand all the instructions given to me. I know to contact the clinic, my physician, or go to the Emergency Department if any problems should occur. I do not have any questions at this time, but understand that I may call the clinic during office hours   should I have any questions or need assistance in obtaining follow up care.    __________________________________________  _____________  __________ Signature of Patient or Authorized Representative            Date                   Time    __________________________________________ Nurse's Signature

## 2011-05-23 ENCOUNTER — Other Ambulatory Visit: Payer: Self-pay | Admitting: *Deleted

## 2011-05-23 DIAGNOSIS — J069 Acute upper respiratory infection, unspecified: Secondary | ICD-10-CM

## 2011-05-23 MED ORDER — AZITHROMYCIN 250 MG PO TABS: ORAL_TABLET | ORAL | Status: AC

## 2011-05-23 NOTE — Telephone Encounter (Signed)
Pt's wife called this morning. She is concerned because her husband has a temp and is having URI symptoms. Reviewed with Dr Marin Olp. To order a Z-pak. Sent via eprescribe.

## 2011-06-20 ENCOUNTER — Other Ambulatory Visit: Payer: Self-pay | Admitting: *Deleted

## 2011-06-20 ENCOUNTER — Encounter (HOSPITAL_COMMUNITY): Payer: Self-pay | Admitting: Pharmacy Technician

## 2011-06-20 DIAGNOSIS — C8583 Other specified types of non-Hodgkin lymphoma, intra-abdominal lymph nodes: Secondary | ICD-10-CM

## 2011-06-20 MED ORDER — SODIUM CHLORIDE 0.9 % IV SOLN: Freq: Once | INTRAVENOUS | Status: DC

## 2011-06-21 ENCOUNTER — Encounter (HOSPITAL_COMMUNITY): Payer: Self-pay

## 2011-06-21 ENCOUNTER — Ambulatory Visit (HOSPITAL_BASED_OUTPATIENT_CLINIC_OR_DEPARTMENT_OTHER): Payer: BC Managed Care – PPO | Admitting: Hematology & Oncology

## 2011-06-21 ENCOUNTER — Ambulatory Visit (HOSPITAL_COMMUNITY)
Admission: RE | Admit: 2011-06-21 | Discharge: 2011-06-21 | Disposition: A | Discharge status: Home / Self Care | Payer: BC Managed Care – PPO | Source: Ambulatory Visit | Attending: Hematology & Oncology | Admitting: Hematology & Oncology

## 2011-06-21 VITALS — BP 131/76 | HR 70 | Temp 97.8°F | Resp 13 | Ht 71.0 in | Wt 212.0 lb

## 2011-06-21 DIAGNOSIS — C8589 Other specified types of non-Hodgkin lymphoma, extranodal and solid organ sites: Secondary | ICD-10-CM | POA: Insufficient documentation

## 2011-06-21 DIAGNOSIS — C8290 Follicular lymphoma, unspecified, unspecified site: Secondary | ICD-10-CM

## 2011-06-21 DIAGNOSIS — C8583 Other specified types of non-Hodgkin lymphoma, intra-abdominal lymph nodes: Secondary | ICD-10-CM

## 2011-06-21 LAB — DIFFERENTIAL
Basophils Absolute: 0 10*3/uL (ref 0.0–0.1)
Basophils Relative: 1 % (ref 0–1)
Eosinophils Absolute: 0.1 10*3/uL (ref 0.0–0.7)
Eosinophils Relative: 2 % (ref 0–5)
Lymphocytes Relative: 30 % (ref 12–46)
Lymphs Abs: 1.2 10*3/uL (ref 0.7–4.0)
Monocytes Absolute: 0.5 10*3/uL (ref 0.1–1.0)
Monocytes Relative: 13 % — ABNORMAL HIGH (ref 3–12)
Neutro Abs: 2.1 10*3/uL (ref 1.7–7.7)
Neutrophils Relative %: 54 % (ref 43–77)

## 2011-06-21 LAB — CBC
HCT: 37.9 % — ABNORMAL LOW (ref 39.0–52.0)
Hemoglobin: 12.4 g/dL — ABNORMAL LOW (ref 13.0–17.0)
MCH: 30.1 pg (ref 26.0–34.0)
MCHC: 32.7 g/dL (ref 30.0–36.0)
MCV: 92 fL (ref 78.0–100.0)
Platelets: 184 10*3/uL (ref 150–400)
RBC: 4.12 MIL/uL — ABNORMAL LOW (ref 4.22–5.81)
RDW: 14.5 % (ref 11.5–15.5)
WBC: 3.8 10*3/uL — ABNORMAL LOW (ref 4.0–10.5)

## 2011-06-21 MED ORDER — MEPERIDINE HCL 25 MG/ML IJ SOLN
INTRAMUSCULAR | Status: DC | PRN
  Administered 2011-06-21 (×2): 12.5 mg via INTRAVENOUS

## 2011-06-21 MED ORDER — SODIUM CHLORIDE 0.9 % IV SOLN
INTRAVENOUS | Status: DC
  Administered 2011-06-21: 07:00:00 via INTRAVENOUS

## 2011-06-21 MED ORDER — MEPERIDINE HCL 50 MG/ML IJ SOLN
INTRAMUSCULAR | Status: AC
  Filled 2011-06-21: qty 1

## 2011-06-21 MED ORDER — MIDAZOLAM HCL 5 MG/5ML IJ SOLN
INTRAMUSCULAR | Status: DC | PRN
  Administered 2011-06-21 (×5): 1 mg via INTRAVENOUS

## 2011-06-21 MED ORDER — MIDAZOLAM HCL 10 MG/2ML IJ SOLN
INTRAMUSCULAR | Status: AC
  Filled 2011-06-21: qty 2

## 2011-06-21 NOTE — ED Notes (Signed)
Patient is resting comfortably. 

## 2011-06-21 NOTE — Discharge Instructions (Signed)
Do not drive  For 24 hours Do not go into public places today May resume your regular diet and take home medications as usual May experience small amount of tingling in leg (biopsy side) May take shower and remove bandage in am For any questions or concerns, call dr If bleeding occurs at site, hold pressure x10 minutes  If continues, call DR ENNEVER    Bone Marrow Aspiration, Bone Marrow Biopsy Care After Read the instructions outlined below and refer to this sheet in the next few weeks. These discharge instructions provide you with general information on caring for yourself after you leave the hospital. Your caregiver may also give you specific instructions. While your treatment has been planned according to the most current medical practices available, unavoidable complications occasionally occur. If you have any problems or questions after discharge, call your caregiver. FINDING OUT THE RESULTS OF YOUR TEST Not all test results are available during your visit. If your test results are not back during the visit, make an appointment with your caregiver to find out the results. Do not assume everything is normal if you have not heard from your caregiver or the medical facility. It is important for you to follow up on all of your test results.  HOME CARE INSTRUCTIONS  You have had sedation and may be sleepy or dizzy. Your thinking may not be as clear as usual. For the next 24 hours:  Only take over-the-counter or prescription medicines for pain, discomfort, and or fever as directed by your caregiver.   Do not drink alcohol.   Do not smoke.   Do not drive.   Do not make important legal decisions.   Do not operate heavy machinery.   Do not care for small children by yourself.   Keep your dressing clean and dry. You may replace dressing with a bandage after 24 hours.   You may take a bath or shower after 24 hours.   Use an ice pack for 20 minutes every 2 hours while awake for pain as  needed.  SEEK MEDICAL CARE IF:   There is redness, swelling, or increasing pain at the biopsy site.   There is pus coming from the biopsy site.   There is drainage from a biopsy site lasting longer than one day.   An unexplained oral temperature above 102 F (38.9 C) develops.  SEEK IMMEDIATE MEDICAL CARE IF:   You develop a rash.   You have difficulty breathing.   You develop any reaction or side effects to medications given.  Document Released: 09/24/2004 Document Revised: 02/24/2011 Document Reviewed: 03/04/2008 [facility] Patient Information 2012 [place].

## 2011-06-21 NOTE — ED Notes (Signed)
procedure ends pt placed supine with dressing to post iliac area

## 2011-06-21 NOTE — ED Notes (Signed)
Post iliac dressing CDI

## 2011-06-21 NOTE — ED Notes (Signed)
Family updated as to patient's status.

## 2011-06-21 NOTE — Sedation Documentation (Signed)
Medication dose calculated and verified [id number] 25 mg and Versed 5mg 

## 2011-06-23 NOTE — Progress Notes (Signed)
This is a procedure note for [name]. He did a bone marrow biopsy and aspirate on him at short stay at [facility]. We do the appropriate timeout procedure. His Malimpati score was 1. His ASA class was 1. We turned up onto his right side. The left posterior iliac crest region was prepped and draped in sterile fashion. 8 cc of 2% lidocaine was infiltrated under the skin down to the periosteum.  A #11 scalpel was used to make an incision to the skin. Be obtained good aspirates. 2 aspirates were obtained. Was sent for flow cytometry and the other for cytogenetics.  We then obtained an excellent bone marrow biopsy core.  The patient tolerated the procedure well. We dressed the marrow biopsy site sterilly. There were no complications.  This is a procedure note for [name] [name] 9:9  Pete E.

## 2011-07-01 ENCOUNTER — Other Ambulatory Visit: Payer: BC Managed Care – PPO | Admitting: Lab

## 2011-07-01 ENCOUNTER — Ambulatory Visit: Payer: BC Managed Care – PPO

## 2011-07-01 ENCOUNTER — Ambulatory Visit (HOSPITAL_BASED_OUTPATIENT_CLINIC_OR_DEPARTMENT_OTHER): Payer: BC Managed Care – PPO | Admitting: Hematology & Oncology

## 2011-07-01 ENCOUNTER — Other Ambulatory Visit: Payer: Self-pay | Admitting: Hematology & Oncology

## 2011-07-01 VITALS — BP 128/82 | HR 79 | Temp 97.9°F | Ht 71.0 in | Wt 216.0 lb

## 2011-07-01 DIAGNOSIS — C8583 Other specified types of non-Hodgkin lymphoma, intra-abdominal lymph nodes: Secondary | ICD-10-CM

## 2011-07-01 DIAGNOSIS — C8589 Other specified types of non-Hodgkin lymphoma, extranodal and solid organ sites: Secondary | ICD-10-CM

## 2011-07-01 MED ORDER — SODIUM CHLORIDE 0.9 % IJ SOLN
10.0000 mL | INTRAMUSCULAR | Status: DC | PRN
  Filled 2011-07-01: qty 10

## 2011-07-01 MED ORDER — ALTEPLASE 2 MG IJ SOLR
2.0000 mg | Freq: Once | INTRAMUSCULAR | Status: DC | PRN
  Filled 2011-07-01: qty 2

## 2011-07-01 MED ORDER — HEPARIN SOD (PORK) LOCK FLUSH 100 UNIT/ML IV SOLN
500.0000 [IU] | Freq: Once | INTRAVENOUS | Status: DC | PRN
  Filled 2011-07-01: qty 5

## 2011-07-01 NOTE — Progress Notes (Signed)
This office note has been dictated.

## 2011-07-01 NOTE — Progress Notes (Signed)
PAC flush with #16G needle wityout difficulity.  Positive blood return.

## 2011-07-01 NOTE — Progress Notes (Signed)
CC:   [name], MD,FACP,FCCP  DIAGNOSIS:  Marginal zone lymphoma.  CURRENT THERAPY:  Observation.  INTERIM HISTORY:  [name] comes in for followup.  We completed all of his chemotherapy back in late February.  He had a very nice response. We did go ahead and repeat a bone marrow test on him.  this was done on 06/21/2011.  The bone marrow report [id number]) showed no evidence of lymphoma within the bone marrow.  Flow cytometry was negative.  He had cytogenetics which were also negative.  He has some occasional discomfort over on his left side.  I told him this might be noted on occasion as his spleen was on the larger side. his splenomegaly resolved with treatment.  he does have a Port-A-Cath in.  I do want to keep the Port-A-Cath in for right now.  he has had no fever.  He has a little bit of a cough and congestion right now.  he thinks this might be from the pollen.  PHYSICAL EXAMINATION:  This is a well-developed, well-nourished white gentleman in no obvious distress.  vital signs:  temperature of 97.9, pulse 79, respiratory rate 20, blood pressure 128/82.  Weight is 216. Head and neck exam shows a normocephalic, atraumatic skull.  There are no ocular or oral lesions.  there are no palpable cervical or supraclavicular lymph nodes.  Lungs:  Clear to percussion and auscultation bilaterally.  He has no rales, wheezes or rhonchi. Cardiac:  regular rate and rhythm with a normal S1 and S2.  There are no murmurs, rubs or bruits.  abdomen:  soft with good bowel sounds.  There is no palpable abdominal mass.  There is no fluid wave.  There is no palpable hepatosplenomegaly.  Extremities show no clubbing, cyanosis or edema.  Axillary exam shows no bilateral axillary adenopathy.  Skin:  no rashes, ecchymosis or petechia.  LABORATORY DATA:  Labs were not done this visit as he just had them done last week.  IMPRESSION:  [name] is a 61 year old gentleman with a history  of marginal zone lymphoma.  He got into remission with systemic chemotherapy.  I believe that given his bone marrow being negative, I just do not think we would be gaining much by put him on maintenance Rituxan right now.  I will plan see him back in 4 months' time.  He has a Port-A-Cath in.  I do want to keep this in.   ______________________________ [name], M.D. PRE/MEDQ  D:  07/01/2011  T:  07/01/2011  Job:  [id number]

## 2011-07-08 ENCOUNTER — Telehealth: Payer: Self-pay | Admitting: *Deleted

## 2011-07-08 NOTE — Telephone Encounter (Addendum)
Message copied by Orlando Penner on Fri Jul 08, 2011  5:21 PM ------      Message from: Burney Gauze R      Created: Wed Jul 06, 2011  2:01 PM       Call - vit d is good This message given to pt.  Voiced understanding.

## 2011-07-11 ENCOUNTER — Telehealth: Payer: Self-pay | Admitting: *Deleted

## 2011-07-11 DIAGNOSIS — J209 Acute bronchitis, unspecified: Secondary | ICD-10-CM

## 2011-07-11 MED ORDER — AZITHROMYCIN 250 MG PO TABS: ORAL_TABLET | ORAL | Status: AC

## 2011-07-11 MED ORDER — METHYLPREDNISOLONE 4 MG PO KIT: PACK | ORAL | Status: AC

## 2011-07-11 NOTE — Telephone Encounter (Signed)
Pt's wife left a message on the voice mail at 1120 stating [name] [name] was running a fever and needed to discuss in further detail. Attempted to reach the pt and/or his wife at 12:30 both #s left [phone] or [phone] but there was no answer and had to leave a message. Pt returned call about 1353 stating that he called Dr [name] [name] office because he had a temp of 102 over the weekend but continues to have a "bad cough". They suggested he call Dr [name] [name] to make sure he didn't need to evaluate him. The earliest appt he could get with Dr [name] [name] is Wed. Reviewed with Dr [name] [name]. To call in Z-pak and Medrol Dose Pak. If his sx don't improve he should f/u with Dr [name] [name]. Also explained for future reference,  fevers Dr [name] [name] would be concerned with are those associated with night sweats and weight loss (which he denied having at this time). Rx sent to [facility] via [facility]cribe.

## 2011-07-22 ENCOUNTER — Other Ambulatory Visit: Payer: Self-pay | Admitting: Hematology & Oncology

## 2011-07-27 ENCOUNTER — Other Ambulatory Visit: Payer: Self-pay | Admitting: Internal Medicine

## 2011-07-27 NOTE — Telephone Encounter (Signed)
refill for Levothyroxine 55mcg tab myla Qty 90 Take one tablet by mouth one time daily  Requesting 3-refills Last filled 2.26.13  Last OV 9.18.12

## 2011-07-27 NOTE — Telephone Encounter (Signed)
OK X 3 months; TSH needed in 8/13 (244.9)

## 2011-07-27 NOTE — Telephone Encounter (Signed)
Caller: [name] [name]/Patient; PCP: Unice Cobble; CB#: [phone]; ; ; Call regarding Has Just Recently Finished Chemo in April , Lingering cough.  Took Z-pack and Steroids which seemed to resolve the cough x 1 week.  Cough has returned, producing green sputum.  Advised see in 24 hours per Cough protocol.Appt w/ Dr. Linna Darner on 07/28/11 per caller request to see PCP only.

## 2011-07-28 ENCOUNTER — Encounter: Payer: Self-pay | Admitting: Internal Medicine

## 2011-07-28 ENCOUNTER — Ambulatory Visit (INDEPENDENT_AMBULATORY_CARE_PROVIDER_SITE_OTHER): Payer: BC Managed Care – PPO | Admitting: Internal Medicine

## 2011-07-28 ENCOUNTER — Ambulatory Visit (HOSPITAL_BASED_OUTPATIENT_CLINIC_OR_DEPARTMENT_OTHER)
Admission: RE | Admit: 2011-07-28 | Discharge: 2011-07-28 | Disposition: A | Discharge status: Home / Self Care | Payer: BC Managed Care – PPO | Source: Ambulatory Visit | Attending: Internal Medicine | Admitting: Internal Medicine

## 2011-07-28 VITALS — BP 130/76 | HR 68 | Temp 98.5°F | Wt 218.0 lb

## 2011-07-28 DIAGNOSIS — R059 Cough, unspecified: Secondary | ICD-10-CM | POA: Insufficient documentation

## 2011-07-28 DIAGNOSIS — J209 Acute bronchitis, unspecified: Secondary | ICD-10-CM | POA: Insufficient documentation

## 2011-07-28 DIAGNOSIS — C8589 Other specified types of non-Hodgkin lymphoma, extranodal and solid organ sites: Secondary | ICD-10-CM

## 2011-07-28 DIAGNOSIS — J40 Bronchitis, not specified as acute or chronic: Secondary | ICD-10-CM

## 2011-07-28 DIAGNOSIS — R05 Cough: Secondary | ICD-10-CM | POA: Insufficient documentation

## 2011-07-28 DIAGNOSIS — C8583 Other specified types of non-Hodgkin lymphoma, intra-abdominal lymph nodes: Secondary | ICD-10-CM

## 2011-07-28 MED ORDER — CEFUROXIME AXETIL 500 MG PO TABS: 500.0000 mg | ORAL_TABLET | Freq: Two times a day (BID) | ORAL | Status: AC

## 2011-07-28 MED ORDER — LEVOTHYROXINE SODIUM 75 MCG PO TABS: 75.0000 ug | ORAL_TABLET | Freq: Every day | ORAL | Status: DC

## 2011-07-28 MED ORDER — FLUTICASONE-SALMETEROL 100-50 MCG/DOSE IN AEPB: 1.0000 | INHALATION_SPRAY | Freq: Two times a day (BID) | RESPIRATORY_TRACT | Status: DC

## 2011-07-28 NOTE — Patient Instructions (Signed)
Order for x-rays entered into  the computer; these will be performed at [facility]. No appointment is necessary.    Please try to go on My Chart within the next 24 hours to allow me to release the results directly to you.

## 2011-07-28 NOTE — Telephone Encounter (Signed)
Rx sent, Pt aware and verbally informed Pt labs dues.

## 2011-07-28 NOTE — Progress Notes (Signed)
  Subjective:    Patient ID: [name] [name], male    DOB: 1950-05-16, 61 y.o.   MRN: [id number]  HPI He has had bronchitic symptoms since the first week in April associated with fever up to 102. His oncologist prescribed a Z-Pak and steroids 4/22. He did respond to this but approximately a week later symptoms recurred.  He denies frontal headache, facial pain, nasal purulence, dental pain, or persistent sore throat. His cough is productive of green sputum and has been associated with some shortness of breath and wheezing.  He has never smoked and has no  history of seasonal allergies or of asthma  Significantly he is being treated for marginal zone lymphoma by Dr. Marin Olp with chemotherapy.    Review of Systems He denies any significant extrinsic symptoms of itchy eyes or sneezing. He is not having significant dyspepsia or dysphagia. He is not on an ACE inhibitor     Objective:   Physical Exam General appearance:good health ;well nourished; no acute distress or increased work of breathing is present.  No  lymphadenopathy about the head, neck, or axilla noted.   Eyes: No conjunctival inflammation or lid edema is present.   Ears:  External ear exam shows no significant lesions or deformities.  Otoscopic examination reveals some wax on R; L TM  tympanic membrane intact  without bulging, retraction, inflammation or discharge.  Nose:  External nasal examination shows no deformity or inflammation. Nasal mucosa are pink and moist without lesions or exudates. No septal dislocation or deviation.No obstruction to airflow.   Oral exam: Dental hygiene is good; lips and gums are healthy appearing.There is no oropharyngeal erythema or exudate noted.   Neck:  No deformities, thyromegaly, masses, or tenderness noted.     Heart:  Normal rate and regular rhythm. S1 and S2 normal without gallop, murmur, click, rub or other extra sounds.   Lungs: Initially low-grade nasal wheezing was noted in the right  chest diffusely. Paroxysmal dry cough.No increased work of breathing.      Extremities:  No cyanosis, edema, or clubbing  noted    Skin: Warm & dry           Assessment & Plan:  #1 bronchitis, protracted with some reactive airways component. No suggestion of upper respiratory infection  #2 marginal zone lymphoma; on active treatment  Plan: See orders and recommendations

## 2011-07-29 ENCOUNTER — Ambulatory Visit: Payer: BC Managed Care – PPO | Admitting: Internal Medicine

## 2011-08-24 ENCOUNTER — Ambulatory Visit (HOSPITAL_BASED_OUTPATIENT_CLINIC_OR_DEPARTMENT_OTHER): Payer: BC Managed Care – PPO

## 2011-08-24 VITALS — BP 146/83 | HR 73 | Temp 97.4°F

## 2011-08-24 DIAGNOSIS — Z452 Encounter for adjustment and management of vascular access device: Secondary | ICD-10-CM

## 2011-08-24 DIAGNOSIS — C8589 Other specified types of non-Hodgkin lymphoma, extranodal and solid organ sites: Secondary | ICD-10-CM

## 2011-08-24 DIAGNOSIS — C8583 Other specified types of non-Hodgkin lymphoma, intra-abdominal lymph nodes: Secondary | ICD-10-CM

## 2011-08-24 MED ORDER — ALTEPLASE 2 MG IJ SOLR
2.0000 mg | Freq: Once | INTRAMUSCULAR | Status: DC | PRN
  Filled 2011-08-24: qty 2

## 2011-08-24 MED ORDER — SODIUM CHLORIDE 0.9 % IJ SOLN
10.0000 mL | INTRAMUSCULAR | Status: DC | PRN
  Administered 2011-08-24: 10 mL via INTRAVENOUS
  Filled 2011-08-24: qty 10

## 2011-08-24 MED ORDER — HEPARIN SOD (PORK) LOCK FLUSH 100 UNIT/ML IV SOLN
500.0000 [IU] | Freq: Once | INTRAVENOUS | Status: AC | PRN
  Administered 2011-08-24: 500 [IU] via INTRAVENOUS
  Filled 2011-08-24: qty 5

## 2011-08-24 NOTE — Progress Notes (Signed)
[name] [name] presented for Portacath access and flush. Proper placement of portacath confirmed by CXR. Portacath located in the right chest wall accessed with  H 20 needle. Clean, Dry and Intact Good blood return present. Portacath flushed with 40ml NS and 500U/83ml Heparin per protocol and needle removed intact. Procedure without incident. Patient tolerated procedure well.

## 2011-08-24 NOTE — Patient Instructions (Signed)

## 2011-09-30 ENCOUNTER — Ambulatory Visit (HOSPITAL_BASED_OUTPATIENT_CLINIC_OR_DEPARTMENT_OTHER): Payer: BC Managed Care – PPO

## 2011-09-30 VITALS — BP 147/82 | HR 75 | Temp 96.6°F

## 2011-09-30 DIAGNOSIS — C8589 Other specified types of non-Hodgkin lymphoma, extranodal and solid organ sites: Secondary | ICD-10-CM

## 2011-09-30 DIAGNOSIS — C8583 Other specified types of non-Hodgkin lymphoma, intra-abdominal lymph nodes: Secondary | ICD-10-CM

## 2011-09-30 DIAGNOSIS — Z452 Encounter for adjustment and management of vascular access device: Secondary | ICD-10-CM

## 2011-09-30 MED ORDER — ALTEPLASE 2 MG IJ SOLR
2.0000 mg | Freq: Once | INTRAMUSCULAR | Status: DC | PRN
  Filled 2011-09-30: qty 2

## 2011-09-30 MED ORDER — SODIUM CHLORIDE 0.9 % IJ SOLN
10.0000 mL | INTRAMUSCULAR | Status: DC | PRN
  Administered 2011-09-30: 10 mL via INTRAVENOUS
  Filled 2011-09-30: qty 10

## 2011-09-30 MED ORDER — HEPARIN SOD (PORK) LOCK FLUSH 100 UNIT/ML IV SOLN
500.0000 [IU] | Freq: Once | INTRAVENOUS | Status: AC | PRN
  Administered 2011-09-30: 500 [IU] via INTRAVENOUS
  Filled 2011-09-30: qty 5

## 2011-09-30 NOTE — Patient Instructions (Signed)

## 2011-10-15 ENCOUNTER — Other Ambulatory Visit: Payer: Self-pay | Admitting: Internal Medicine

## 2011-10-17 NOTE — Telephone Encounter (Signed)
Lipid/hep 272.4/995.20

## 2011-11-08 ENCOUNTER — Other Ambulatory Visit: Payer: Self-pay | Admitting: Internal Medicine

## 2011-11-08 NOTE — Telephone Encounter (Signed)
244.9 TSH

## 2011-11-09 ENCOUNTER — Other Ambulatory Visit (INDEPENDENT_AMBULATORY_CARE_PROVIDER_SITE_OTHER): Payer: BC Managed Care – PPO

## 2011-11-09 DIAGNOSIS — E039 Hypothyroidism, unspecified: Secondary | ICD-10-CM

## 2011-11-09 LAB — TSH: TSH: 2.63 u[IU]/mL (ref 0.35–5.50)

## 2011-11-09 NOTE — Progress Notes (Signed)
Labs only

## 2011-11-11 ENCOUNTER — Other Ambulatory Visit (HOSPITAL_BASED_OUTPATIENT_CLINIC_OR_DEPARTMENT_OTHER): Payer: BC Managed Care – PPO | Admitting: Lab

## 2011-11-11 ENCOUNTER — Ambulatory Visit (HOSPITAL_BASED_OUTPATIENT_CLINIC_OR_DEPARTMENT_OTHER): Payer: BC Managed Care – PPO | Admitting: Hematology & Oncology

## 2011-11-11 ENCOUNTER — Ambulatory Visit: Payer: BC Managed Care – PPO

## 2011-11-11 VITALS — BP 121/80 | HR 71 | Temp 97.6°F | Resp 20 | Ht 72.0 in | Wt 223.0 lb

## 2011-11-11 DIAGNOSIS — C8583 Other specified types of non-Hodgkin lymphoma, intra-abdominal lymph nodes: Secondary | ICD-10-CM

## 2011-11-11 DIAGNOSIS — M818 Other osteoporosis without current pathological fracture: Secondary | ICD-10-CM

## 2011-11-11 LAB — CBC WITH DIFFERENTIAL (CANCER CENTER ONLY)
BASO#: 0 10*3/uL (ref 0.0–0.2)
BASO%: 0.7 % (ref 0.0–2.0)
HCT: 40.5 % (ref 38.7–49.9)
HGB: 14 g/dL (ref 13.0–17.1)
LYMPH#: 2.1 10*3/uL (ref 0.9–3.3)
LYMPH%: 37.5 % (ref 14.0–48.0)
MCHC: 34.6 g/dL (ref 32.0–35.9)
MCV: 88 fL (ref 82–98)
MONO#: 0.7 10*3/uL (ref 0.1–0.9)
NEUT%: 48.4 % (ref 40.0–80.0)
RDW: 13.3 % (ref 11.1–15.7)
WBC: 5.5 10*3/uL (ref 4.0–10.0)

## 2011-11-11 MED ORDER — HEPARIN SOD (PORK) LOCK FLUSH 100 UNIT/ML IV SOLN
500.0000 [IU] | Freq: Once | INTRAVENOUS | Status: AC | PRN
  Administered 2011-11-11: 500 [IU] via INTRAVENOUS
  Filled 2011-11-11: qty 5

## 2011-11-11 MED ORDER — SODIUM CHLORIDE 0.9 % IJ SOLN
10.0000 mL | INTRAMUSCULAR | Status: DC | PRN
  Administered 2011-11-11: 10 mL via INTRAVENOUS
  Filled 2011-11-11: qty 10

## 2011-11-11 MED ORDER — ALTEPLASE 2 MG IJ SOLR
2.0000 mg | Freq: Once | INTRAMUSCULAR | Status: DC | PRN
  Filled 2011-11-11: qty 2

## 2011-11-11 NOTE — Progress Notes (Signed)
This office note has been dictated.

## 2011-11-11 NOTE — Progress Notes (Signed)
[name] [name] presented for Portacath access and flush. Proper placement of portacath confirmed by CXR. Portacath located in the right chest wall accessed with  H 20 needle. Clean, Dry and Intact Good blood return present. Portacath flushed with 12ml NS and 500U/83ml Heparin per protocol and needle removed intact. Procedure without incident. Patient tolerated procedure well.

## 2011-11-11 NOTE — Progress Notes (Signed)
CC:   [name], MD,FACP,FCCP  DIAGNOSIS:  Marginal zone lymphoma, clinical remission.  INTERIM HISTORY:  [name] comes in for followup.  We last saw him back in April.  He has done very well.  He completed chemotherapy back in February.  All of his post chemotherapy studies did not show any evidence of recurrence or residual disease.  He has had no problems with abdominal pain.  He has had some back pain. This has been in his right hip area.  He says it is getting better.  It happened after he pulled up something.  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  He says it is not getting any worse, so he does not think that he needs to have it looked at.  He has had no fevers, sweats, or chills.  He has not noticed any palpable lymph glands.  There have been no rashes.  There has been no leg swelling.  PHYSICAL EXAMINATION:  General:  This is a well-developed, well- nourished white gentleman in no obvious distress.  Vital Signs:  Show a temperature of 97.6, pulse 71, respiratory rate 18, blood pressure 121/80, weight is 223.  Head and Neck Exam:  Shows a normocephalic, atraumatic skull.  There are no ocular or oral lesions.  There are no palpable cervical or supraclavicular lymph nodes.  Lungs:  Clear bilaterally.  Cardiac Exam:  Regular rate and rhythm with a normal S1 and S2.  There are no murmurs, rubs, or bruits.  Abdominal Exam:  Soft with good bowel sounds.  There is no palpable abdominal mass.  There is no fluid wave.  There is no palpable hepatosplenomegaly.  Back Exam:  No tenderness of the spine, ribs, or hips.  Specifically, there is no tenderness in the right posterior hip area.  He has good range motion of that hip.  Extremities:  Show no clubbing, cyanosis, or edema. Neurological Exam:  Shows no focal neurological deficits.  LABORATORY STUDIES:  Show a white cell count of 5.5, hemoglobin 14, hematocrit 40.5, platelet  count 167.  IMPRESSION:  [name] is a 61 year old gentleman with marginal zone lymphoma.  He was treated with R-CVP.  I think he had 8 cycles of therapy.  He completed this back in February.  Again, there is no residual disease that we can see on his post therapy studies.  I do not see any indication for scans right now.  Again, the neck issue has been chronic.  The right back issue is improving.  I still want to keep his Port-A-Cath in.  We will flush it today.  We will get him back in 3 more months.  We will probably plan to get his Port-A-Cath out next February.  Again, I do not see an indication for any scans right now.    ______________________________ [name], M.D. PRE/MEDQ  D:  11/11/2011  T:  11/11/2011  Job:  [id number]

## 2011-11-28 ENCOUNTER — Other Ambulatory Visit: Payer: Self-pay | Admitting: Internal Medicine

## 2011-11-28 NOTE — Telephone Encounter (Signed)
LIPID/HEP 272.4/995.20  

## 2011-12-05 ENCOUNTER — Other Ambulatory Visit (INDEPENDENT_AMBULATORY_CARE_PROVIDER_SITE_OTHER): Payer: BC Managed Care – PPO

## 2011-12-05 DIAGNOSIS — T887XXA Unspecified adverse effect of drug or medicament, initial encounter: Secondary | ICD-10-CM

## 2011-12-05 DIAGNOSIS — E785 Hyperlipidemia, unspecified: Secondary | ICD-10-CM

## 2011-12-05 LAB — HEPATIC FUNCTION PANEL
AST: 27 U/L (ref 0–37)
Albumin: 4.3 g/dL (ref 3.5–5.2)
Alkaline Phosphatase: 49 U/L (ref 39–117)
Bilirubin, Direct: 0 mg/dL (ref 0.0–0.3)
Total Protein: 7.2 g/dL (ref 6.0–8.3)

## 2011-12-05 LAB — LIPID PANEL
Cholesterol: 166 mg/dL (ref 0–200)
Total CHOL/HDL Ratio: 4
Triglycerides: 145 mg/dL (ref 0.0–149.0)

## 2011-12-06 ENCOUNTER — Other Ambulatory Visit: Payer: Self-pay | Admitting: Internal Medicine

## 2011-12-06 MED ORDER — LEVOTHYROXINE SODIUM 75 MCG PO TABS: ORAL_TABLET | ORAL | Status: DC

## 2011-12-06 MED ORDER — PRAVASTATIN SODIUM 40 MG PO TABS: ORAL_TABLET | ORAL | Status: DC

## 2011-12-20 ENCOUNTER — Other Ambulatory Visit: Payer: Self-pay | Admitting: Hematology & Oncology

## 2011-12-21 ENCOUNTER — Other Ambulatory Visit: Payer: Self-pay | Admitting: *Deleted

## 2011-12-21 MED ORDER — ZOLPIDEM TARTRATE 10 MG PO TABS: 10.0000 mg | ORAL_TABLET | Freq: Every evening | ORAL | Status: DC | PRN

## 2011-12-23 ENCOUNTER — Ambulatory Visit (HOSPITAL_BASED_OUTPATIENT_CLINIC_OR_DEPARTMENT_OTHER): Payer: BC Managed Care – PPO

## 2011-12-23 VITALS — BP 140/83 | HR 64 | Temp 98.4°F | Resp 20

## 2011-12-23 DIAGNOSIS — Z452 Encounter for adjustment and management of vascular access device: Secondary | ICD-10-CM

## 2011-12-23 DIAGNOSIS — C8583 Other specified types of non-Hodgkin lymphoma, intra-abdominal lymph nodes: Secondary | ICD-10-CM

## 2011-12-23 MED ORDER — SODIUM CHLORIDE 0.9 % IJ SOLN
10.0000 mL | INTRAMUSCULAR | Status: DC | PRN
Start: 2011-12-23 — End: 2011-12-23
  Administered 2011-12-23: 10 mL via INTRAVENOUS
  Filled 2011-12-23: qty 10

## 2011-12-23 MED ORDER — HEPARIN SOD (PORK) LOCK FLUSH 100 UNIT/ML IV SOLN
500.0000 [IU] | Freq: Once | INTRAVENOUS | Status: AC | PRN
  Administered 2011-12-23: 500 [IU] via INTRAVENOUS
  Filled 2011-12-23: qty 5

## 2011-12-23 NOTE — Patient Instructions (Signed)

## 2012-02-24 ENCOUNTER — Other Ambulatory Visit (HOSPITAL_BASED_OUTPATIENT_CLINIC_OR_DEPARTMENT_OTHER): Payer: BC Managed Care – PPO | Admitting: Lab

## 2012-02-24 ENCOUNTER — Ambulatory Visit: Payer: BC Managed Care – PPO

## 2012-02-24 ENCOUNTER — Ambulatory Visit (HOSPITAL_BASED_OUTPATIENT_CLINIC_OR_DEPARTMENT_OTHER): Payer: BC Managed Care – PPO | Admitting: Hematology & Oncology

## 2012-02-24 VITALS — BP 130/72 | HR 68 | Temp 98.2°F | Resp 16

## 2012-02-24 DIAGNOSIS — C8583 Other specified types of non-Hodgkin lymphoma, intra-abdominal lymph nodes: Secondary | ICD-10-CM

## 2012-02-24 DIAGNOSIS — M818 Other osteoporosis without current pathological fracture: Secondary | ICD-10-CM

## 2012-02-24 DIAGNOSIS — M79609 Pain in unspecified limb: Secondary | ICD-10-CM

## 2012-02-24 DIAGNOSIS — C8589 Other specified types of non-Hodgkin lymphoma, extranodal and solid organ sites: Secondary | ICD-10-CM

## 2012-02-24 LAB — CBC WITH DIFFERENTIAL (CANCER CENTER ONLY)
BASO#: 0 10*3/uL (ref 0.0–0.2)
BASO%: 0.5 % (ref 0.0–2.0)
EOS%: 1.1 % (ref 0.0–7.0)
HCT: 40.1 % (ref 38.7–49.9)
LYMPH#: 2 10*3/uL (ref 0.9–3.3)
MCH: 31 pg (ref 28.0–33.4)
MCHC: 34.4 g/dL (ref 32.0–35.9)
MONO%: 11.7 % (ref 0.0–13.0)
NEUT%: 49.8 % (ref 40.0–80.0)
RDW: 12.6 % (ref 11.1–15.7)

## 2012-02-24 MED ORDER — SODIUM CHLORIDE 0.9 % IJ SOLN
10.0000 mL | INTRAMUSCULAR | Status: DC | PRN
  Administered 2012-02-24: 10 mL via INTRAVENOUS
  Filled 2012-02-24: qty 10

## 2012-02-24 MED ORDER — HEPARIN SOD (PORK) LOCK FLUSH 100 UNIT/ML IV SOLN
500.0000 [IU] | Freq: Once | INTRAVENOUS | Status: AC | PRN
  Administered 2012-02-24: 500 [IU] via INTRAVENOUS
  Filled 2012-02-24: qty 5

## 2012-02-24 NOTE — Progress Notes (Signed)
This office note has been dictated.

## 2012-02-24 NOTE — Progress Notes (Signed)
[name] presented for Portacath access and flush. Proper placement of portacath confirmed by CXR. Portacath located in the right chest wall accessed with  H 20 needle. Clean, Dry and Intact Good blood return present. Portacath flushed with 86ml NS and 500U/33ml Heparin per protocol and needle removed intact. Procedure without incident. Patient tolerated procedure well.

## 2012-02-25 LAB — LACTATE DEHYDROGENASE: LDH: 158 U/L (ref 94–250)

## 2012-02-25 LAB — VITAMIN D 25 HYDROXY (VIT D DEFICIENCY, FRACTURES): Vit D, 25-Hydroxy: 36 ng/mL (ref 30–89)

## 2012-02-25 LAB — COMPREHENSIVE METABOLIC PANEL
Albumin: 4.4 g/dL (ref 3.5–5.2)
Alkaline Phosphatase: 55 U/L (ref 39–117)
BUN: 20 mg/dL (ref 6–23)
CO2: 25 mEq/L (ref 19–32)
Glucose, Bld: 88 mg/dL (ref 70–99)
Potassium: 4.2 mEq/L (ref 3.5–5.3)
Sodium: 137 mEq/L (ref 135–145)
Total Bilirubin: 0.5 mg/dL (ref 0.3–1.2)
Total Protein: 6.5 g/dL (ref 6.0–8.3)

## 2012-02-25 NOTE — Progress Notes (Signed)
CC:   Darrick Penna. Linna Darner, MD,FACP,FCCP  DIAGNOSES:  Marginal zone lymphoma, clinical remission.  CURRENT THERAPY:  Observation.  INTERIM HISTORY:  [name] comes in for followup.  He is doing well. He is still working.  He works down at [employer].  He enjoys this and gets to see good concerts.  He has had no problems with nausea or vomiting.  There has been no cough or shortness of breath.  He has had no fever.  He has some chronic right arm pain from a neck issue.  He has had no change in bowel or bladder habits.  He has had no leg issues.  There have been no rashes.  His Port-A-Cath is still in place.  PHYSICAL EXAMINATION:  General:  This is a well-developed, well- nourished white gentleman in no obvious distress.  Vital signs:  98.2, pulse 68, respiratory rate 16, blood pressure 130/72.  Weight is 227. Head and neck:  Shows a normocephalic, atraumatic skull.  There are no ocular or oral lesions.  There are no palpable cervical or supraclavicular lymph nodes.  Lungs:  Clear bilaterally.  Cardiac: Regular rate and rhythm with a normal S1, S2.  There are no murmurs, rubs or bruits.  Axillary:  Shows no bilateral axillary adenopathy. Abdomen:  Soft with good bowel sounds.  There is no fluid wave.  There is no palpable abdominal mass.  There is no palpable hepatosplenomegaly. Extremities:  Show no clubbing, cyanosis or edema.  He has good range of motion of his joints.  Skin:  No rash, ecchymosis or petechia.  LABORATORY STUDIES:  White cell count is 5.5, hemoglobin is 13.8, hematocrit 40.1, platelet count 170.  IMPRESSION:  [name] is a 61 year old gentleman with history of marginal zone lymphoma.  Again, he is in a complete remission.  We actually did a bone marrow biopsy on him back in April.  The bone marrow did not show any evidence of lymphoma.  He was treated with R-CVP.  This was completed in February 2013.  We will plan to get him back in 3 more months.  I think  if all looks good at that point in time, we can certainly get his Port-A-Cath out.   ______________________________ [name], M.D. PRE/MEDQ  D:  02/24/2012  T:  02/25/2012  Job:  [id number]

## 2012-03-07 ENCOUNTER — Encounter: Payer: Self-pay | Admitting: Family Medicine

## 2012-03-07 ENCOUNTER — Ambulatory Visit (INDEPENDENT_AMBULATORY_CARE_PROVIDER_SITE_OTHER): Payer: BC Managed Care – PPO | Admitting: Family Medicine

## 2012-03-07 VITALS — BP 138/80 | HR 77 | Temp 98.6°F | Ht 70.5 in | Wt 225.8 lb

## 2012-03-07 DIAGNOSIS — J209 Acute bronchitis, unspecified: Secondary | ICD-10-CM | POA: Insufficient documentation

## 2012-03-07 MED ORDER — AZITHROMYCIN 250 MG PO TABS: ORAL_TABLET | ORAL | Status: DC

## 2012-03-07 MED ORDER — GUAIFENESIN-CODEINE 100-10 MG/5ML PO SYRP: 10.0000 mL | ORAL_SOLUTION | Freq: Three times a day (TID) | ORAL | Status: DC | PRN

## 2012-03-07 NOTE — Patient Instructions (Addendum)
Start the Zpack for presumed bronchitis Drink plenty of fluids Use the cough syrup as needed- will cause drowsiness REST! Call with any questions or concerns Hang in there! Happy Holidays!

## 2012-03-07 NOTE — Progress Notes (Signed)
  Subjective:    Patient ID: [name] [name], male    DOB: 1951-03-14, 61 y.o.   MRN: [id number]  HPI URI- Sunday developed fever, Tm 101.  Cough is worse at night, intermittently productive.  + congestion.  No ear pain.  No facial pain.  + body aches.  + fatigue- recovering from lymphoma.  + sick contacts.   Review of Systems For ROS see HPI     Objective:   Physical Exam  Vitals reviewed. Constitutional: He appears well-developed and well-nourished. No distress.  HENT:  Head: Normocephalic and atraumatic.  Right Ear: Tympanic membrane normal.  Left Ear: Tympanic membrane normal.  Nose: No mucosal edema or rhinorrhea. Right sinus exhibits no maxillary sinus tenderness and no frontal sinus tenderness. Left sinus exhibits no maxillary sinus tenderness and no frontal sinus tenderness.  Mouth/Throat: Mucous membranes are normal. No oropharyngeal exudate, posterior oropharyngeal edema or posterior oropharyngeal erythema.  Eyes: Conjunctivae normal and EOM are normal. Pupils are equal, round, and reactive to light.  Neck: Normal range of motion. Neck supple.  Cardiovascular: Normal rate, regular rhythm and normal heart sounds.   Pulmonary/Chest: Effort normal and breath sounds normal. No respiratory distress. He has no wheezes.       + hacking cough  Lymphadenopathy:    He has no cervical adenopathy.  Skin: Skin is warm and dry.          Assessment & Plan:

## 2012-03-07 NOTE — Assessment & Plan Note (Signed)
New.  Due to pt's hx of lymphoma and compromised immune system, will start abx.  Cough meds prn.  Reviewed supportive care and red flags that should prompt return.  Pt expressed understanding and is in agreement w/ plan.

## 2012-03-12 ENCOUNTER — Telehealth: Payer: Self-pay | Admitting: *Deleted

## 2012-03-12 ENCOUNTER — Encounter: Payer: Self-pay | Admitting: *Deleted

## 2012-03-12 NOTE — Telephone Encounter (Signed)
Pt left VM that he needs a note from when he was seen in the office. Letter printed and mailed to Pt at his request.

## 2012-03-21 DIAGNOSIS — K649 Unspecified hemorrhoids: Secondary | ICD-10-CM

## 2012-03-21 HISTORY — PX: COLONOSCOPY W/ POLYPECTOMY: SHX1380

## 2012-03-21 HISTORY — DX: Unspecified hemorrhoids: K64.9

## 2012-03-30 ENCOUNTER — Ambulatory Visit (INDEPENDENT_AMBULATORY_CARE_PROVIDER_SITE_OTHER): Payer: BC Managed Care – PPO | Admitting: Internal Medicine

## 2012-03-30 ENCOUNTER — Encounter: Payer: Self-pay | Admitting: Internal Medicine

## 2012-03-30 VITALS — BP 114/70 | HR 100 | Temp 101.6°F | Wt 226.0 lb

## 2012-03-30 DIAGNOSIS — R509 Fever, unspecified: Secondary | ICD-10-CM

## 2012-03-30 DIAGNOSIS — J111 Influenza due to unidentified influenza virus with other respiratory manifestations: Secondary | ICD-10-CM

## 2012-03-30 LAB — POCT INFLUENZA A/B
Influenza A, POC: POSITIVE
Influenza B, POC: POSITIVE

## 2012-03-30 MED ORDER — OSELTAMIVIR PHOSPHATE 75 MG PO CAPS: 75.0000 mg | ORAL_CAPSULE | Freq: Two times a day (BID) | ORAL | Status: DC

## 2012-03-30 NOTE — Patient Instructions (Addendum)
NSAIDS ( Aleve, Advil, Naproxen) or Tylenol every 4 hrs as needed for fever as discussed based on label recommendations Stay well hydrated. Drink to thirst up to 40 ounces of fluids daily. Please remain out of work until  04/01/12. Zicam Melts or Zinc lozenges ; vitamin C 2000 mg daily; & Echinacea for 4-7 days. Report persistent fever, exudate("pus") or progressive pain.

## 2012-03-30 NOTE — Progress Notes (Signed)
  Subjective:    Patient ID: [name] [name], male    DOB: 23-May-1950, 62 y.o.   MRN: [id number]  HPI The  symptoms began  03/29/12  as malaise followed by fever up to 101.8 with chills ,  Sweats & arthralgias Significant associated symptoms include some nasal purulence & rattly cough   Cough was associated with  slight wheezing .       Marland Kitchen  Treatment with Rx cough med from 12/12 & NSAIDSwas partially effective  There is no history of asthma. The patient had never smoked                Review of Systems Symptoms not present included frontal headache, facial pain, dental pain, sore throat, earache , and otic discharge Itchy , watery eyes & sneezing were not noted        Objective:   Physical Exam General appearance:good health ;well nourished; no acute distress or increased work of breathing is present.  No  lymphadenopathy about the head, neck, or axilla noted. appearstired Eyes: No conjunctival inflammation or lid edema is present.  Ears:  External ear exam shows no significant lesions or deformities.  Otoscopic examination reveals impaction on R Nose:  External nasal examination shows no deformity or inflammation. Nasal mucosa are pink and moist without lesions or exudates. No septal dislocation or deviation.No obstruction to airflow.  Oral exam: Dental hygiene is good; lips and gums are healthy appearing.There is no oropharyngeal erythema or exudate noted.  Neck:  No deformities, thyromegaly, masses, or tenderness noted.   Supple with full range of motion without pain.  Heart:  Normal rate and regular rhythm. S1 and S2 normal without gallop, murmur, click, rub or other extra sounds.  Lungs:Chest clear to auscultation; no wheezes, rhonchi,rales ,or rubs present.No increased work of breathing.   Extremities:  No cyanosis, edema, or clubbing  noted  Skin: Warm & dry w/o jaundice or tenting.           Assessment & Plan:  #1Influenza Plan: See orders and  recommendations

## 2012-04-16 ENCOUNTER — Telehealth: Payer: Self-pay | Admitting: Hematology & Oncology

## 2012-04-16 NOTE — Telephone Encounter (Addendum)
Message copied by Trevor Mace on Mon Apr 16, 2012  4:23 PM ------      Message from: [place], [place]      Created: Mon Feb 27, 2012 12:41 PM                   ----- Message -----         From: [name], MD         Sent: 02/27/2012   7:40 AM           To: Adella Nissen Nurse Hp            Please call and tell him that his labs really look good. Thanks. Pete  04-16-12   4:30pm.  Called and spoke with patient regarding above MD message, verbalized understanding. Roxan Diesel LPN

## 2012-05-05 ENCOUNTER — Other Ambulatory Visit: Payer: Self-pay

## 2012-05-23 ENCOUNTER — Other Ambulatory Visit: Payer: Self-pay | Admitting: Medical

## 2012-05-23 DIAGNOSIS — C8583 Other specified types of non-Hodgkin lymphoma, intra-abdominal lymph nodes: Secondary | ICD-10-CM

## 2012-05-24 ENCOUNTER — Ambulatory Visit: Payer: BC Managed Care – PPO

## 2012-05-24 ENCOUNTER — Ambulatory Visit (HOSPITAL_BASED_OUTPATIENT_CLINIC_OR_DEPARTMENT_OTHER): Payer: BC Managed Care – PPO | Admitting: Medical

## 2012-05-24 ENCOUNTER — Other Ambulatory Visit (HOSPITAL_BASED_OUTPATIENT_CLINIC_OR_DEPARTMENT_OTHER): Payer: BC Managed Care – PPO | Admitting: Lab

## 2012-05-24 VITALS — BP 126/79 | HR 75 | Temp 98.0°F | Resp 18 | Ht 70.0 in | Wt 228.0 lb

## 2012-05-24 DIAGNOSIS — C8590 Non-Hodgkin lymphoma, unspecified, unspecified site: Secondary | ICD-10-CM

## 2012-05-24 DIAGNOSIS — C8583 Other specified types of non-Hodgkin lymphoma, intra-abdominal lymph nodes: Secondary | ICD-10-CM

## 2012-05-24 DIAGNOSIS — K625 Hemorrhage of anus and rectum: Secondary | ICD-10-CM

## 2012-05-24 LAB — COMPREHENSIVE METABOLIC PANEL
AST: 19 U/L (ref 0–37)
Albumin: 4.3 g/dL (ref 3.5–5.2)
BUN: 19 mg/dL (ref 6–23)
CO2: 26 mEq/L (ref 19–32)
Calcium: 9.6 mg/dL (ref 8.4–10.5)
Chloride: 104 mEq/L (ref 96–112)
Creatinine, Ser: 1.13 mg/dL (ref 0.50–1.35)
Glucose, Bld: 90 mg/dL (ref 70–99)
Potassium: 4.3 mEq/L (ref 3.5–5.3)

## 2012-05-24 LAB — CBC WITH DIFFERENTIAL (CANCER CENTER ONLY)
BASO#: 0 10*3/uL (ref 0.0–0.2)
Eosinophils Absolute: 0.1 10*3/uL (ref 0.0–0.5)
HCT: 42 % (ref 38.7–49.9)
HGB: 14.2 g/dL (ref 13.0–17.1)
LYMPH#: 2.5 10*3/uL (ref 0.9–3.3)
MCHC: 33.8 g/dL (ref 32.0–35.9)
MONO#: 0.7 10*3/uL (ref 0.1–0.9)
NEUT#: 3.3 10*3/uL (ref 1.5–6.5)
NEUT%: 50.5 % (ref 40.0–80.0)
RBC: 4.66 10*6/uL (ref 4.20–5.70)
WBC: 6.6 10*3/uL (ref 4.0–10.0)

## 2012-05-24 LAB — LACTATE DEHYDROGENASE: LDH: 154 U/L (ref 94–250)

## 2012-05-24 MED ORDER — HEPARIN SOD (PORK) LOCK FLUSH 100 UNIT/ML IV SOLN
500.0000 [IU] | Freq: Once | INTRAVENOUS | Status: AC
  Administered 2012-05-24: 500 [IU] via INTRAVENOUS
  Filled 2012-05-24: qty 5

## 2012-05-24 MED ORDER — SODIUM CHLORIDE 0.9 % IJ SOLN
10.0000 mL | INTRAMUSCULAR | Status: DC | PRN
  Administered 2012-05-24: 10 mL via INTRAVENOUS
  Filled 2012-05-24: qty 10

## 2012-05-24 NOTE — Progress Notes (Signed)
CC:   Darrick Penna. Linna Darner, MD,FACP,FCCP  DIAGNOSES:  Marginal zone lymphoma, clinical remission.  CURRENT THERAPY:  Observation.  INTERIM HISTORY:  [name] presents today for an office followup visit.  Overall, he, reports, that he's doing relatively well.  He still continues to work.  He works, down at [employer].  He did have the flu.  Back in February.  He did have a low-grade fever with some night sweats.  He still has his Port-A-Cath in place.  He was told on his last visit, that if everything looked good.  Today.  He can go ahead and get his Port-A-Cath removed.  He did have a bone marrow, biopsy.  Back in April, which showed no evidence of lymphoma.  He was treated with R.-CVP.  That was completed in February, 2013.  He, reports, a good appetite.  He denies any nausea, vomiting, diarrhea, constipation, chest pain, shortness breath, cough.  He denies any fevers, chills, or night sweats.  He denies any palpable not.  He denies any lower leg swelling.  He denies any unintentional weight loss, or weight gain.  He denies any headaches, visual changes, or rashes.  Of note, he, reports, 2 episodes of bright red blood on the tablet paper.  He, reports, that it was a very minimal amount.  He's not sure if he has hemorrhoids or not.  I did advise for him to followup with Dr. Linna Darner.  He, reports, he had a colonoscopy 5 years ago, which was completely normal.  Review of Systems: Constitutional:Negative for malaise/fatigue, fever, chills, weight loss, diaphoresis, activity change, appetite change, and unexpected weight change.  HEENT: Negative for double vision, blurred vision, visual loss, ear pain, tinnitus, congestion, rhinorrhea, epistaxis sore throat or sinus disease, oral pain/lesion, tongue soreness Respiratory: Negative for cough, chest tightness, shortness of breath, wheezing and stridor.  Cardiovascular: Negative for chest pain, palpitations, leg swelling, orthopnea, PND, DOE or  claudication Gastrointestinal: Negative for nausea, vomiting, abdominal pain, diarrhea, constipation, blood in stool, melena, hematochezia, abdominal distention, anal bleeding, rectal pain, anorexia and hematemesis.  Genitourinary: Negative for dysuria, frequency, hematuria,  Musculoskeletal: Negative for myalgias, back pain, joint swelling, arthralgias and gait problem.  Skin: Negative for rash, color change, pallor and wound.  Neurological:. Negative for dizziness/light-headedness, tremors, seizures, syncope, facial asymmetry, speech difficulty, weakness, numbness, headaches and paresthesias.  Hematological: Negative for adenopathy. Does not bruise/bleed easily.  Psychiatric/Behavioral:  Negative for depression, no loss of interest in normal activity or change in sleep pattern.   Physical Exam: This is a pleasant, 62 year old, well-developed, well-nourished, white gentleman, in no obvious distress Vitals: Temperature 98.0 degrees, pulse 75, respirations 18, blood pressure 126/79, weight 228 pounds HEENT reveals a normocephalic, atraumatic skull, no scleral icterus, no oral lesions  Neck is supple without any cervical or supraclavicular adenopathy.  Lungs are clear to auscultation bilaterally. There are no wheezes, rales or rhonci Cardiac is regular rate and rhythm with a normal S1 and S2. There are no murmurs, rubs, or bruits.  Abdomen is soft with good bowel sounds, there is no palpable mass. There is no palpable hepatosplenomegaly. There is no palpable fluid wave.  Musculoskeletal no tenderness of the spine, ribs, or hips.  Extremities there are no clubbing, cyanosis, or edema.  Skin no petechia, purpura or ecchymosis Neurologic is nonfocal.  Laboratory Data: White count 6.6, hemoglobin 14.2, hematocrit 42.0, platelets 206,000  Current Outpatient Prescriptions on File Prior to Visit  Medication Sig Dispense Refill  . aspirin 81 MG tablet  Take 81 mg by mouth at bedtime.       .  cholecalciferol (VITAMIN D) 1000 UNITS tablet Take 2,000 Units by mouth daily.      Marland Kitchen levothyroxine (SYNTHROID, LEVOTHROID) 75 MCG tablet TAKE 1 TABLET BY MOUTH DAILY  IN THE MORNING EXCEPT TUESDAY ANDTHURSDAY TAKE 1 AND 1/2 TABLET AS DIRECTED  35 tablet  11  . pravastatin (PRAVACHOL) 40 MG tablet Take 40 mg by mouth at bedtime.        . Fluticasone-Salmeterol (ADVAIR) 100-50 MCG/DOSE AEPB Inhale 1 puff into the lungs 2 (two) times daily.  14 each  0  . zolpidem (AMBIEN) 10 MG tablet Take 5 mg by mouth at bedtime as needed. Sleep       No current facility-administered medications on file prior to visit.   Assessment/Plan: This is a pleasant, 62 year old, white gentleman, with the following issues:  #1.  History of marginal zone lymphoma.  Again, he is in complete remission.  He had a bone marrow, biopsy.  Back in April, which did not show any evidence of lymphoma.  He was treated with R.-CVP.  This was completed in February, 2013.`.  We will still continue to follow him every 3 months.  #2.  Port-A-Cath.  He will go ahead and get her port flushed today.  We will go ahead and set him up to have his Port-A-Cath removed.  It is been one year, without any signs of recurrence.  #3.  Intermittent rectal bleeding.  Again, this has been noticed twice, and only on the toilet paper.  I did advise him to get an appointment with Dr. Linna Darner.  #4.  Followup.  [name] will follow back up with [place] in 3 months, but before then should there be questions or concerns.

## 2012-05-30 ENCOUNTER — Telehealth: Payer: Self-pay | Admitting: Hematology & Oncology

## 2012-05-30 ENCOUNTER — Other Ambulatory Visit: Payer: Self-pay | Admitting: Medical

## 2012-05-30 DIAGNOSIS — C8583 Other specified types of non-Hodgkin lymphoma, intra-abdominal lymph nodes: Secondary | ICD-10-CM

## 2012-05-30 NOTE — Telephone Encounter (Signed)
PA aware no order in for port removal and referral is in system wrong

## 2012-05-31 ENCOUNTER — Other Ambulatory Visit: Payer: Self-pay | Admitting: Radiology

## 2012-06-04 ENCOUNTER — Other Ambulatory Visit: Payer: Self-pay | Admitting: Radiology

## 2012-06-04 ENCOUNTER — Encounter (HOSPITAL_COMMUNITY): Payer: Self-pay | Admitting: Pharmacy Technician

## 2012-06-08 ENCOUNTER — Ambulatory Visit (HOSPITAL_COMMUNITY)
Admission: RE | Admit: 2012-06-08 | Discharge: 2012-06-08 | Disposition: A | Discharge status: Home / Self Care | Payer: BC Managed Care – PPO | Source: Ambulatory Visit | Attending: Hematology & Oncology | Admitting: Hematology & Oncology

## 2012-06-08 ENCOUNTER — Ambulatory Visit (HOSPITAL_COMMUNITY)
Admission: RE | Admit: 2012-06-08 | Discharge: 2012-06-08 | Disposition: A | Discharge status: Home / Self Care | Payer: BC Managed Care – PPO | Source: Ambulatory Visit | Attending: Medical | Admitting: Medical

## 2012-06-08 ENCOUNTER — Encounter (HOSPITAL_COMMUNITY): Payer: Self-pay

## 2012-06-08 DIAGNOSIS — E785 Hyperlipidemia, unspecified: Secondary | ICD-10-CM | POA: Insufficient documentation

## 2012-06-08 DIAGNOSIS — Z79899 Other long term (current) drug therapy: Secondary | ICD-10-CM | POA: Insufficient documentation

## 2012-06-08 DIAGNOSIS — C8589 Other specified types of non-Hodgkin lymphoma, extranodal and solid organ sites: Secondary | ICD-10-CM | POA: Insufficient documentation

## 2012-06-08 DIAGNOSIS — E039 Hypothyroidism, unspecified: Secondary | ICD-10-CM | POA: Insufficient documentation

## 2012-06-08 DIAGNOSIS — C8583 Other specified types of non-Hodgkin lymphoma, intra-abdominal lymph nodes: Secondary | ICD-10-CM

## 2012-06-08 LAB — CBC
MCV: 89.1 fL (ref 78.0–100.0)
Platelets: 178 10*3/uL (ref 150–400)
RBC: 4.5 MIL/uL (ref 4.22–5.81)
RDW: 13.2 % (ref 11.5–15.5)
WBC: 5.2 10*3/uL (ref 4.0–10.5)

## 2012-06-08 LAB — APTT: aPTT: 28 seconds (ref 24–37)

## 2012-06-08 LAB — PROTIME-INR: Prothrombin Time: 12.8 seconds (ref 11.6–15.2)

## 2012-06-08 MED ORDER — MIDAZOLAM HCL 2 MG/2ML IJ SOLN
INTRAMUSCULAR | Status: AC | PRN
  Administered 2012-06-08: 1 mg via INTRAVENOUS

## 2012-06-08 MED ORDER — LIDOCAINE HCL 1 % IJ SOLN
INTRAMUSCULAR | Status: AC
  Filled 2012-06-08: qty 20

## 2012-06-08 MED ORDER — CEFAZOLIN SODIUM-DEXTROSE 2-3 GM-% IV SOLR
2.0000 g | INTRAVENOUS | Status: AC
  Administered 2012-06-08: 2 g via INTRAVENOUS
  Filled 2012-06-08: qty 50

## 2012-06-08 MED ORDER — FENTANYL CITRATE 0.05 MG/ML IJ SOLN
INTRAMUSCULAR | Status: AC | PRN
  Administered 2012-06-08: 50 ug via INTRAVENOUS

## 2012-06-08 MED ORDER — FENTANYL CITRATE 0.05 MG/ML IJ SOLN
INTRAMUSCULAR | Status: AC
  Filled 2012-06-08: qty 4

## 2012-06-08 MED ORDER — SODIUM CHLORIDE 0.9 % IV SOLN
INTRAVENOUS | Status: DC
  Administered 2012-06-08: 13:00:00 via INTRAVENOUS

## 2012-06-08 MED ORDER — MIDAZOLAM HCL 2 MG/2ML IJ SOLN
INTRAMUSCULAR | Status: AC
  Filled 2012-06-08: qty 4

## 2012-06-08 NOTE — H&P (Signed)
[name] [name] is an 62 y.o. male.   Chief Complaint: "I'm having my port taken out" HPI: Patient with history of marginal zone lymphoma, currently in remission, who presents today for port a cath removal.  Past Medical History  Diagnosis Date  . Hyperlipidemia   . Thyroid disease 2007    hypothyroidism  . 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 Colonoscopy  2008    Dr Fuller Plan, due 2018  . Tonsillectomy    . Portacath placement  Dec 2012    Family History  Problem Relation Age of Onset  . Alzheimer's disease Mother   . Heart attack Father 48  . Diabetes Brother   . Hypertension Brother      X 3   Social History:  reports that he has never smoked. He does not have any smokeless tobacco history on file. He reports that  drinks alcohol. He reports that he does not use illicit drugs.  Allergies: No Known Allergies  Current outpatient prescriptions:aspirin 81 MG tablet, Take 81 mg by mouth at bedtime. , Disp: , Rfl: ;  cholecalciferol (VITAMIN D) 1000 UNITS tablet, Take 1,000 Units by mouth daily. , Disp: , Rfl: ;  DiphenhydrAMINE HCl, Sleep, (ZZZQUIL PO), Take 1 tablet by mouth at bedtime as needed (sleep)., Disp: , Rfl: ;  ibuprofen (ADVIL,MOTRIN) 200 MG tablet, Take 600 mg by mouth every 6 (six) hours as needed for pain., Disp: , Rfl:  levothyroxine (SYNTHROID, LEVOTHROID) 75 MCG tablet, Take 75-112.5 mcg by mouth daily. Takes 1 tablet daily except Tuesday and Thursday he takes 1 and 1/2 tablet, Disp: , Rfl: ;  pravastatin (PRAVACHOL) 40 MG tablet, Take 40 mg by mouth at bedtime.  , Disp: , Rfl:  Current facility-administered medications:0.9 %  sodium chloride infusion, , Intravenous, Continuous, D Kevin Jaeda Bruso, PA-C, Last Rate: 20 mL/hr at 06/08/12 1243;  ceFAZolin (ANCEF) IVPB 2 g/50 mL premix, 2 g, Intravenous, On Call, D Rowe Robert, PA-C   Results for orders placed during the hospital encounter of  06/08/12 (from the past 48 hour(s))  APTT     Status: None   Collection Time    06/08/12 12:35 PM      Result Value Range   aPTT 28  24 - 37 seconds  CBC     Status: None   Collection Time    06/08/12 12:35 PM      Result Value Range   WBC 5.2  4.0 - 10.5 K/uL   RBC 4.50  4.22 - 5.81 MIL/uL   Hemoglobin 13.6  13.0 - 17.0 g/dL   HCT 40.1  39.0 - 52.0 %   MCV 89.1  78.0 - 100.0 fL   MCH 30.2  26.0 - 34.0 pg   MCHC 33.9  30.0 - 36.0 g/dL   RDW 13.2  11.5 - 15.5 %   Platelets 178  150 - 400 K/uL  PROTIME-INR     Status: None   Collection Time    06/08/12 12:35 PM      Result Value Range   Prothrombin Time 12.8  11.6 - 15.2 seconds   INR 0.97  0.00 - 1.49   No results found.  Review of Systems  Constitutional: Negative for fever and chills.  Respiratory: Negative for cough and shortness of breath.   Cardiovascular: Negative for chest pain.  Gastrointestinal: Positive for blood in stool. Negative for nausea, vomiting and abdominal pain.  Genitourinary: Negative for dysuria and hematuria.  Musculoskeletal: Negative for back pain.  Neurological: Negative for headaches.    Blood pressure 144/91, pulse 75, temperature 96.9 F (36.1 C), resp. rate 20, SpO2 97.00%. Physical Exam  Constitutional: He is oriented to person, place, and time. He appears well-developed and well-nourished.  Cardiovascular: Normal rate and regular rhythm.   Respiratory: Effort normal and breath sounds normal.  GI: Soft. Bowel sounds are normal. There is no tenderness.  Musculoskeletal: Normal range of motion.  Neurological: He is alert and oriented to person, place, and time.     Assessment/Plan Pt with hx of marginal zone lymphoma, currently in remission. Plan is for port a cath removal today. Details/risks of procedure d/w pt/wife with their understanding and consent.  Felicidad Sugarman,D KEVIN 06/08/2012, 3:05 PM

## 2012-06-08 NOTE — Procedures (Signed)
RIJV PAC removal No comp 

## 2012-06-11 ENCOUNTER — Ambulatory Visit (INDEPENDENT_AMBULATORY_CARE_PROVIDER_SITE_OTHER): Payer: BC Managed Care – PPO | Admitting: Family Medicine

## 2012-06-11 ENCOUNTER — Encounter: Payer: Self-pay | Admitting: Family Medicine

## 2012-06-11 ENCOUNTER — Telehealth: Payer: Self-pay | Admitting: Internal Medicine

## 2012-06-11 ENCOUNTER — Encounter: Payer: Self-pay | Admitting: Gastroenterology

## 2012-06-11 VITALS — BP 120/76 | HR 93 | Wt 232.0 lb

## 2012-06-11 DIAGNOSIS — K625 Hemorrhage of anus and rectum: Secondary | ICD-10-CM

## 2012-06-11 NOTE — Progress Notes (Signed)
  Subjective:    [name] [name] is a 62 y.o. male here for evaluation of blood in stool. Patient has associated symptoms of visible blood: water in bowl turns pinkish. The patient denies abdominal pain, alternating loose stools and constipation, constipation, diarrhea and loose stools. The patient has a known history of: hx nonhodgkins lymphoma and chemo. The patient has had several episodes of rectal bleeding. There is not a history of rectal injury. Patient has had similar episodes of rectal bleeding in the past.---- during chemo but he was constipated  The following portions of the patient's history were reviewed and updated as appropriate: allergies, current medications, past family history, past medical history, past social history, past surgical history and problem list.  Review of Systems Pertinent items are noted in HPI.    Objective:    BP 120/76  Pulse 93  Wt 232 lb (105.235 kg)  BMI 33.29 kg/m2  SpO2 97% General appearance: alert, cooperative, appears stated age and no distress Abdomen: soft, non-tender; bowel sounds normal; no masses,  no organomegaly Rectal: heme + stool    Assessment:    Rectal bleeding,    Plan:    1. Begin medication: nexium . 2. Blood work per orders. 3. refer to Gi. Follow up prn.

## 2012-06-11 NOTE — Telephone Encounter (Signed)
Spoke with patient, patient would like to be seen here in the office today. Patient did not feel this was a matter that he should go to the ER for. Patient was requesting to be seen here, I scheduled patient appointment today with Dr.Lowne at 2:30, patient refused earlier appointment with Dr.Paz at 11:15am.   Per Dr.Hopper's protocol if appointment scheduled ok to close encounter

## 2012-06-11 NOTE — Patient Instructions (Signed)
Gastrointestinal Bleeding Gastrointestinal (GI) bleeding means there is bleeding somewhere along the digestive tract, between the mouth and anus. CAUSES  There are many different problems that can cause GI bleeding. Possible causes include:  Esophagitis. This is inflammation, irritation, or swelling of the esophagus.  Hemorrhoids.These are veins that are full of blood (engorged) in the rectum. They cause pain, inflammation, and may bleed.  Anal fissures.These are areas of painful tearing which may bleed. They are often caused by passing hard stool.  Diverticulosis.These are pouches that form on the colon over time, with age, and may bleed significantly.  Diverticulitis.This is inflammation in areas with diverticulosis. It can cause pain, fever, and bloody stools, although bleeding is rare.  Polyps and cancer. Colon cancer often starts out as precancerous polyps.  Gastritis and ulcers.Bleeding from the upper gastrointestinal tract (near the stomach) may travel through the intestines and produce black, sometimes tarry, often bad smelling stools. In certain cases, if the bleeding is fast enough, the stools may not be black, but red. This condition may be life-threatening. SYMPTOMS   Vomiting bright red blood or material that looks like coffee grounds.  Bloody, black, or tarry stools. DIAGNOSIS  Your caregiver may diagnose your condition by taking your history and performing a physical exam. More tests may be needed, including:  X-rays and other imaging tests.  Esophagogastroduodenoscopy (EGD). This test uses a flexible, lighted tube to look at your esophagus, stomach, and small intestine.  Colonoscopy. This test uses a flexible, lighted tube to look at your colon. TREATMENT  Treatment depends on the cause of your bleeding.   For bleeding from the esophagus, stomach, small intestine, or colon, the caregiver doing your EGD or colonoscopy may be able to stop the bleeding as part of  the procedure.  Inflammation or infection of the colon can be treated with medicines.  Many rectal problems can be treated with creams, suppositories, or warm baths.  Surgery is sometimes needed.  Blood transfusions are sometimes needed if you have lost a lot of blood. If bleeding is slow, you may be allowed to go home. If there is a lot of bleeding, you will need to stay in the hospital for observation. HOME CARE INSTRUCTIONS   Take any medicines exactly as prescribed.  Keep your stools soft by eating foods that are high in fiber. These foods include whole grains, legumes, fruits, and vegetables. Prunes (1 to 3 a day) work well for many people.  Drink enough fluids to keep your urine clear or pale yellow.  Take sitz baths if advised. A sitz bath is when you sit in a bathtub with warm water for 10 to 15 minutes to soak, soothe, and cleanse the rectal area. SEEK IMMEDIATE MEDICAL CARE IF:   Your bleeding increases.  You feel lightheaded, weak, or you faint.  You have severe cramps in your back or abdomen.  You pass large blood clots in your stool.  Your problems are getting worse. MAKE SURE YOU:   Understand these instructions.  Will watch your condition.  Will get help right away if you are not doing well or get worse. Document Released: 03/04/2000 Document Revised: 05/30/2011 Document Reviewed: 02/14/2011 [facility] Patient Information 2013 [place].

## 2012-06-11 NOTE — Telephone Encounter (Signed)
Patient Information:  Caller Name: [place]  Phone: [phone]  Patient: [name] [name], [name] [name]  Gender: Male  DOB: 1950/06/29  Age: 62 Years  PCP: Unice Cobble  Office Follow Up:  Does the office need to follow up with this patient?: Yes  Instructions For The Office: OFFICE ALL OF DR HOPPERS APPTS ARE FULL.  CAN PT BE WORKED INTO SCHEDULE (AT THE PHYSICAL OPENING AT 0930 MAYBE?)  PLEASE FOLLOW UP WITH PATIENT  RN Note:  Pt reports bloody stools happened on 3/19,3/20 and 3/21 and again on 3/23. Caller states water in toliet is red.  Symptoms  Reason For Call & Symptoms: blood in stools  Reviewed Health History In EMR: Yes  Reviewed Medications In EMR: Yes  Reviewed Allergies In EMR: Yes  Reviewed Surgeries / Procedures: Yes  Date of Onset of Symptoms: 06/06/2012  Guideline(s) Used:  Rectal Bleeding  Disposition Per Guideline:   Go to ED Now (or to Office with PCP Approval)  Reason For Disposition Reached:   Bloody, black, or tarry bowel movements  Advice Given:  N/A  Patient Will Follow Care Advice:  YES

## 2012-06-12 LAB — BASIC METABOLIC PANEL
BUN: 18 mg/dL (ref 6–23)
Calcium: 9.3 mg/dL (ref 8.4–10.5)
GFR: 66.99 mL/min (ref >60.00)
Glucose, Bld: 96 mg/dL (ref 70–99)
Potassium: 4.1 mEq/L (ref 3.5–5.1)
Sodium: 141 mEq/L (ref 135–145)

## 2012-06-12 LAB — HEPATIC FUNCTION PANEL
ALT: 21 U/L (ref 0–53)
AST: 23 U/L (ref 0–37)
Alkaline Phosphatase: 60 U/L (ref 39–117)
Total Bilirubin: 0.3 mg/dL (ref 0.3–1.2)

## 2012-06-12 LAB — CBC WITH DIFFERENTIAL/PLATELET
Basophils Absolute: 0 10*3/uL (ref 0.0–0.1)
Eosinophils Relative: 1.7 % (ref 0.0–5.0)
Hemoglobin: 13.6 g/dL (ref 13.0–17.0)
Lymphocytes Relative: 35.8 % (ref 12.0–46.0)
Monocytes Relative: 9.1 % (ref 3.0–12.0)
Platelets: 181 10*3/uL (ref 150.0–400.0)
RDW: 13.8 % (ref 11.5–14.6)
WBC: 6 10*3/uL (ref 4.5–10.5)

## 2012-06-13 ENCOUNTER — Telehealth: Payer: Self-pay | Admitting: *Deleted

## 2012-06-13 NOTE — Telephone Encounter (Signed)
Message copied by Marylen Ponto on Wed Jun 13, 2012  1:13 PM ------      Message from: Rosalita Chessman      Created: Tue Jun 12, 2012  1:06 PM       Recheck labs in 6 months----- hyperlipidemia, hypothyroid-----tsh, lipid, hep ------

## 2012-06-27 ENCOUNTER — Other Ambulatory Visit (INDEPENDENT_AMBULATORY_CARE_PROVIDER_SITE_OTHER): Payer: BC Managed Care – PPO

## 2012-06-27 ENCOUNTER — Encounter: Payer: Self-pay | Admitting: Gastroenterology

## 2012-06-27 ENCOUNTER — Ambulatory Visit (INDEPENDENT_AMBULATORY_CARE_PROVIDER_SITE_OTHER): Payer: BC Managed Care – PPO | Admitting: Gastroenterology

## 2012-06-27 VITALS — BP 138/84 | HR 84 | Ht 70.0 in | Wt 226.8 lb

## 2012-06-27 DIAGNOSIS — K921 Melena: Secondary | ICD-10-CM

## 2012-06-27 DIAGNOSIS — R195 Other fecal abnormalities: Secondary | ICD-10-CM

## 2012-06-27 LAB — CBC WITH DIFFERENTIAL/PLATELET
Basophils Absolute: 0.1 10*3/uL (ref 0.0–0.1)
Eosinophils Absolute: 0.1 10*3/uL (ref 0.0–0.7)
HCT: 39.9 % (ref 39.0–52.0)
Lymphs Abs: 2.4 10*3/uL (ref 0.7–4.0)
MCHC: 34.6 g/dL (ref 30.0–36.0)
MCV: 88.9 fl (ref 78.0–100.0)
Monocytes Absolute: 0.6 10*3/uL (ref 0.1–1.0)
Platelets: 194 10*3/uL (ref 150.0–400.0)
RDW: 14.1 % (ref 11.5–14.6)

## 2012-06-27 MED ORDER — PEG-KCL-NACL-NASULF-NA ASC-C 100 G PO SOLR: 1.0000 | Freq: Once | ORAL | Status: DC

## 2012-06-27 NOTE — Progress Notes (Signed)
History of Present Illness: This is a 62 year old male accompanied by his wife. He has a history of marginal zone lymphoma and completed chemotherapy for his lymphoma one year ago. He is felt to be in complete remission. He has had recurrent episodes of hematochezia with bowel movements over the past month. Sometimes small amounts and at other times moderate amounts. Recent exam by Dr. Etter Sjogren revealed Hemoccult positive stool. He underwent a screening colonoscopy in January 2008 showing only internal hemorrhoids. His hemoglobin was normal at 13.6 on March 24. Denies weight loss, abdominal pain, constipation, diarrhea, change in stool caliber, melena, nausea, vomiting, dysphagia, reflux symptoms, chest pain.  Review of Systems: Pertinent positive and negative review of systems were noted in the above HPI section. All other review of systems were otherwise negative.  Current Medications, Allergies, Past Medical History, Past Surgical History, Family History and Social History were reviewed in [employer] record.  Physical Exam: General: Well developed , well nourished, no acute distress Head: Normocephalic and atraumatic Eyes:  sclerae anicteric, EOMI Ears: Normal auditory acuity Mouth: No deformity or lesions Neck: Supple, no masses or thyromegaly Lungs: Clear throughout to auscultation Heart: Regular rate and rhythm; no murmurs, rubs or bruits Abdomen: Soft, non tender and non distended. No masses, hepatosplenomegaly or hernias noted. Normal Bowel sounds Rectal: Deferred to colonoscopy, recent exam by Dr. Etter Sjogren showed Hemoccult-positive stool and no lesions Musculoskeletal: Symmetrical with no gross deformities  Skin: No lesions on visible extremities Pulses:  Normal pulses noted Extremities: No clubbing, cyanosis, edema or deformities noted Neurological: Alert oriented x 4, grossly nonfocal Cervical Nodes:  No significant cervical adenopathy Inguinal Nodes: No  significant inguinal adenopathy Psychological:  Alert and cooperative. Normal mood and affect  Assessment and Recommendations:  1. Hematochezia and Hemoccult-positive stool. Repeat CBC today. Rule out colorectal neoplasms, hemorrhoids and other sources of bleeding. The risks, benefits, and alternatives to colonoscopy with possible biopsy, possible destruction of internal hemorrhoids and possible polypectomy were discussed with the patient and they consent to proceed.

## 2012-06-27 NOTE — Patient Instructions (Addendum)
Your physician has requested that you go to the basement for the following lab work before leaving today: CBC.  You have been scheduled for a colonoscopy with propofol. Please follow written instructions given to you at your visit today.  Please pick up your prep kit at the pharmacy within the next 1-3 days. If you use inhalers (even only as needed), please bring them with you on the day of your procedure.  Thank you for choosing me and [facility].  Pricilla Riffle. Dagoberto Ligas., MD., Marval Regal

## 2012-07-03 ENCOUNTER — Ambulatory Visit (AMBULATORY_SURGERY_CENTER): Payer: BC Managed Care – PPO | Admitting: Gastroenterology

## 2012-07-03 ENCOUNTER — Encounter: Payer: Self-pay | Admitting: Gastroenterology

## 2012-07-03 ENCOUNTER — Emergency Department (HOSPITAL_COMMUNITY)
Admission: EM | Admit: 2012-07-03 | Discharge: 2012-07-03 | Disposition: A | Discharge status: Home / Self Care | Payer: BC Managed Care – PPO | Attending: Emergency Medicine | Admitting: Emergency Medicine

## 2012-07-03 ENCOUNTER — Encounter (HOSPITAL_COMMUNITY): Payer: Self-pay | Admitting: Emergency Medicine

## 2012-07-03 ENCOUNTER — Telehealth: Payer: Self-pay | Admitting: Gastroenterology

## 2012-07-03 ENCOUNTER — Emergency Department (HOSPITAL_COMMUNITY): Payer: BC Managed Care – PPO

## 2012-07-03 VITALS — BP 149/84 | HR 69 | Temp 97.8°F | Resp 13 | Ht 70.0 in | Wt 226.0 lb

## 2012-07-03 DIAGNOSIS — Z8679 Personal history of other diseases of the circulatory system: Secondary | ICD-10-CM | POA: Insufficient documentation

## 2012-07-03 DIAGNOSIS — R195 Other fecal abnormalities: Secondary | ICD-10-CM

## 2012-07-03 DIAGNOSIS — R509 Fever, unspecified: Secondary | ICD-10-CM

## 2012-07-03 DIAGNOSIS — E039 Hypothyroidism, unspecified: Secondary | ICD-10-CM | POA: Insufficient documentation

## 2012-07-03 DIAGNOSIS — Z8739 Personal history of other diseases of the musculoskeletal system and connective tissue: Secondary | ICD-10-CM | POA: Insufficient documentation

## 2012-07-03 DIAGNOSIS — D126 Benign neoplasm of colon, unspecified: Secondary | ICD-10-CM

## 2012-07-03 DIAGNOSIS — R110 Nausea: Secondary | ICD-10-CM | POA: Insufficient documentation

## 2012-07-03 DIAGNOSIS — Z7982 Long term (current) use of aspirin: Secondary | ICD-10-CM | POA: Insufficient documentation

## 2012-07-03 DIAGNOSIS — R5082 Postprocedural fever: Secondary | ICD-10-CM | POA: Insufficient documentation

## 2012-07-03 DIAGNOSIS — Z79899 Other long term (current) drug therapy: Secondary | ICD-10-CM | POA: Insufficient documentation

## 2012-07-03 DIAGNOSIS — E785 Hyperlipidemia, unspecified: Secondary | ICD-10-CM | POA: Insufficient documentation

## 2012-07-03 DIAGNOSIS — K648 Other hemorrhoids: Secondary | ICD-10-CM

## 2012-07-03 DIAGNOSIS — K921 Melena: Secondary | ICD-10-CM

## 2012-07-03 LAB — CBC WITH DIFFERENTIAL/PLATELET
Basophils Relative: 0 % (ref 0–1)
Eosinophils Absolute: 0.1 10*3/uL (ref 0.0–0.7)
Eosinophils Relative: 1 % (ref 0–5)
MCH: 30.4 pg (ref 26.0–34.0)
MCHC: 33.6 g/dL (ref 30.0–36.0)
Monocytes Absolute: 0.8 10*3/uL (ref 0.1–1.0)
Neutro Abs: 6.8 10*3/uL (ref 1.7–7.7)
WBC: 8.4 10*3/uL (ref 4.0–10.5)

## 2012-07-03 LAB — URINALYSIS, ROUTINE W REFLEX MICROSCOPIC
Glucose, UA: NEGATIVE mg/dL
Hgb urine dipstick: NEGATIVE
Ketones, ur: NEGATIVE mg/dL
Leukocytes, UA: NEGATIVE
Protein, ur: NEGATIVE mg/dL
pH: 5 (ref 5.0–8.0)

## 2012-07-03 LAB — COMPREHENSIVE METABOLIC PANEL
ALT: 18 U/L (ref 0–53)
AST: 34 U/L (ref 0–37)
Albumin: 4.4 g/dL (ref 3.5–5.2)
Alkaline Phosphatase: 50 U/L (ref 39–117)
Potassium: 5.4 mEq/L — ABNORMAL HIGH (ref 3.5–5.1)
Sodium: 136 mEq/L (ref 135–145)
Total Protein: 7.9 g/dL (ref 6.0–8.3)

## 2012-07-03 MED ORDER — AZITHROMYCIN 250 MG PO TABS: 250.0000 mg | ORAL_TABLET | Freq: Every day | ORAL | Status: DC

## 2012-07-03 MED ORDER — IBUPROFEN 100 MG PO CHEW
400.0000 mg | CHEWABLE_TABLET | Freq: Three times a day (TID) | ORAL | Status: DC | PRN
  Filled 2012-07-03: qty 4

## 2012-07-03 MED ORDER — DOXYCYCLINE HYCLATE 100 MG PO CAPS: 100.0000 mg | ORAL_CAPSULE | Freq: Two times a day (BID) | ORAL | Status: DC

## 2012-07-03 MED ORDER — ACETAMINOPHEN 325 MG PO TABS: 650.0000 mg | ORAL_TABLET | Freq: Four times a day (QID) | ORAL | Status: DC | PRN

## 2012-07-03 MED ORDER — SODIUM CHLORIDE 0.9 % IV SOLN: 500.0000 mL | INTRAVENOUS | Status: DC

## 2012-07-03 MED ORDER — ONDANSETRON HCL 4 MG/2ML IJ SOLN: 4.0000 mg | Freq: Once | INTRAMUSCULAR | Status: DC

## 2012-07-03 MED ORDER — ACETAMINOPHEN 325 MG PO TABS
650.0000 mg | ORAL_TABLET | Freq: Once | ORAL | Status: AC
  Administered 2012-07-03: 650 mg via ORAL
  Filled 2012-07-03: qty 2

## 2012-07-03 MED ORDER — SODIUM CHLORIDE 0.9 % IV BOLUS (SEPSIS)
1000.0000 mL | Freq: Once | INTRAVENOUS | Status: AC
  Administered 2012-07-03: 1000 mL via INTRAVENOUS

## 2012-07-03 MED ORDER — IBUPROFEN 200 MG PO TABS
400.0000 mg | ORAL_TABLET | Freq: Once | ORAL | Status: AC
  Administered 2012-07-03: 400 mg via ORAL
  Filled 2012-07-03: qty 2

## 2012-07-03 MED ORDER — AZITHROMYCIN 250 MG PO TABS
500.0000 mg | ORAL_TABLET | Freq: Once | ORAL | Status: AC
  Administered 2012-07-03: 500 mg via ORAL
  Filled 2012-07-03: qty 2

## 2012-07-03 NOTE — Progress Notes (Signed)
Called to room to assist during endoscopic procedure.  Patient ID and intended procedure confirmed with present staff. Received instructions for my participation in the procedure from the performing physician. ewm 

## 2012-07-03 NOTE — ED Notes (Signed)
Pt had a colonoscopy this morning.  Afterward he began to feel nauseous.  Pt was given antinausea medicine and felt better.  Pt developed a fever around 1700 this afternoon.  Denies NVD now.  Pt states he developed a cough last night.  States that he told them before the procedure and they listened to his lungs and told him it was ok.

## 2012-07-03 NOTE — Telephone Encounter (Signed)
On call note @ 1745.  Pt underwent an uneventful colonoscopy this morning with cold snare removal of one small polyp and injection sclerosis of bleeding internal hemorrhoids. His wife calls reporting that he has chills and a fever to 102.1 this afternoon. No abd pain, chest pain, cough, nausea or vomiting. Advised to stay NPO and for her to take him to [facility] ED now for urgent evaluation.

## 2012-07-03 NOTE — ED Provider Notes (Signed)
History     CSN: [id number]  Arrival date & time 07/03/12  1813   First MD Initiated Contact with Patient 07/03/12 1819      Chief Complaint  Patient presents with  . Post-op Problem  . Fever    HPI  Pt had a colonoscopy this morning. Afterward he began to feel nauseous. Pt was given antinausea medicine and felt better.  Had chills prior to onset of fever. Pt developed a fever around 1700 this afternoon. Denies NVD now. Pt states he developed a cough last night. States that he told them before the procedure and they listened to his lungs and told him it was ok.        Past Medical History  Diagnosis Date  . Hyperlipidemia   . Thyroid disease 2007    hypothyroidism  . 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 Hemorrhoids   . Arthritis     Past Surgical History  Procedure Laterality Date  . Colonoscopy  2008    Dr Fuller Plan, due 2018  . Tonsillectomy    . Portacath placement  Dec 2012    Family History  Problem Relation Age of Onset  . Alzheimer's disease Mother   . Heart attack Father 36  . Diabetes Brother   . Hypertension Brother      X 3    History  Substance Use Topics  . Smoking status: Never Smoker   . Smokeless tobacco: Never Used  . Alcohol Use: 0.0 oz/week     Comment:  1-2 wine / week      Review of Systems All other systems reviewed and are negative Allergies  Review of patient's allergies indicates no known allergies.  Home Medications   Current Outpatient Rx  Name  Route  Sig  Dispense  Refill  . aspirin EC 81 MG tablet   Oral   Take 81 mg by mouth at bedtime.         . cholecalciferol (VITAMIN D) 1000 UNITS tablet   Oral   Take 1,000 Units by mouth daily.          . DiphenhydrAMINE HCl, Sleep, (ZZZQUIL PO)   Oral   Take 1 tablet by mouth at bedtime as needed (sleep).         Marland Kitchen levothyroxine (SYNTHROID, LEVOTHROID) 75 MCG tablet   Oral   Take 75-112.5 mcg by mouth daily. Takes 1 tablet daily except  Tuesday and Thursday he takes 1 and 1/2 tablet         . pravastatin (PRAVACHOL) 40 MG tablet   Oral   Take 40 mg by mouth at bedtime.           Marland Kitchen azithromycin (ZITHROMAX) 250 MG tablet   Oral   Take 1 tablet (250 mg total) by mouth daily. 1 every day until finished.   4 tablet   0   . doxycycline (VIBRAMYCIN) 100 MG capsule   Oral   Take 1 capsule (100 mg total) by mouth 2 (two) times daily.   20 capsule   0     BP 135/71  Pulse 94  Temp(Src) 101.5 F (38.6 C) (Oral)  Resp 16  SpO2 95%  Physical Exam  Nursing note and vitals reviewed. Constitutional: He is oriented to person, place, and time. He appears well-developed and well-nourished. No distress.  HENT:  Head: Normocephalic and atraumatic.  Eyes: Pupils are equal, round, and reactive to light.  Neck: Normal range of motion.  Cardiovascular:  Normal rate and intact distal pulses.   Pulmonary/Chest: No respiratory distress.  Abdominal: Soft. Normal appearance. He exhibits no distension. There is no tenderness. There is no rebound and no guarding.  Musculoskeletal: Normal range of motion.  Neurological: He is alert and oriented to person, place, and time. No cranial nerve deficit.  Skin: Skin is warm and dry. No rash noted.  Psychiatric: He has a normal mood and affect. His behavior is normal.    ED Course  Procedures (including critical care time)  Labs Reviewed  CBC WITH DIFFERENTIAL - Abnormal; Notable for the following:    Neutrophils Relative 82 (*)    Lymphocytes Relative 8 (*)    All other components within normal limits  COMPREHENSIVE METABOLIC PANEL - Abnormal; Notable for the following:    Potassium 5.4 (*)    Glucose, Bld 110 (*)    GFR calc non Af Amer 74 (*)    GFR calc Af Amer 86 (*)    All other components within normal limits  CULTURE, BLOOD (ROUTINE X 2)  CULTURE, BLOOD (ROUTINE X 2)  URINALYSIS, ROUTINE W REFLEX MICROSCOPIC  LACTIC ACID, PLASMA   Dg Abd Acute W/chest  07/03/2012   *RADIOLOGY REPORT*  Clinical Data: Fever and abdominal pain.  Colonoscopy this morning.  ACUTE ABDOMEN SERIES (ABDOMEN 2 VIEW & CHEST 1 VIEW)  Comparison: Chest x-ray dated 07/28/2011 and CT scan of the abdomen dated 04/18/2011  Findings: Heart and lungs appear normal.  No free air in the abdomen.  There is air scattered throughout the large and small bowel.  No bowel distention.  No free fluid.  Osseous structures are normal.  IMPRESSION: Benign-appearing abdomen and chest.   Original Report Authenticated By: Lorriane Shire, M.D.      1. Fever       MDM  After treatment in the ED the patient feels back to baseline and wants to go home..  Patient still with no pain at discharge.  I spoke with Dr. Fuller Plan and discussed cased with him.  Doubts fever related to colonoscopy.  Did have cough prior to procedure.  Possible URI??  Patient looks stable .  Will treat empirically with abx.   Instructed to return of condition changes or worsens.  Patient agrees with plan.        Dot Lanes, MD 07/04/12 1044

## 2012-07-03 NOTE — Patient Instructions (Signed)
YOU HAD AN ENDOSCOPIC PROCEDURE TODAY AT THE [facility]: Refer to the procedure report that was given to you for any specific questions about what was found during the examination.  If the procedure report does not answer your questions, please call your gastroenterologist to clarify.  If you requested that your care partner not be given the details of your procedure findings, then the procedure report has been included in a sealed envelope for you to review at your convenience later.  YOU SHOULD EXPECT: Some feelings of bloating in the abdomen. Passage of more gas than usual.  Walking can help get rid of the air that was put into your GI tract during the procedure and reduce the bloating. If you had a lower endoscopy (such as a colonoscopy or flexible sigmoidoscopy) you may notice spotting of blood in your stool or on the toilet paper. If you underwent a bowel prep for your procedure, then you may not have a normal bowel movement for a few days.  DIET: Your first meal following the procedure should be a light meal and then it is ok to progress to your normal diet.  A half-sandwich or bowl of soup is an example of a good first meal.  Heavy or fried foods are harder to digest and may make you feel nauseous or bloated.  Likewise meals heavy in dairy and vegetables can cause extra gas to form and this can also increase the bloating.  Drink plenty of fluids but you should avoid alcoholic beverages for 24 hours.  ACTIVITY: Your care partner should take you home directly after the procedure.  You should plan to take it easy, moving slowly for the rest of the day.  You can resume normal activity the day after the procedure however you should NOT DRIVE or use heavy machinery for 24 hours (because of the sedation medicines used during the test).    SYMPTOMS TO REPORT IMMEDIATELY: A gastroenterologist can be reached at any hour.  During normal business hours, 8:30 AM to 5:00 PM Monday through Friday,  call [phone].  After hours and on weekends, please call the GI answering service at [phone] who will take a message and have the physician on call contact you.   Following lower endoscopy (colonoscopy or flexible sigmoidoscopy):  Excessive amounts of blood in the stool  Significant tenderness or worsening of abdominal pains  Swelling of the abdomen that is new, acute  Fever of 100F or higher    FOLLOW UP: If any biopsies were taken you will be contacted by phone or by letter within the next 1-3 weeks.  Call your gastroenterologist if you have not heard about the biopsies in 3 weeks.  Our staff will call the home number listed on your records the next business day following your procedure to check on you and address any questions or concerns that you may have at that time regarding the information given to you following your procedure. This is a courtesy call and so if there is no answer at the home number and we have not heard from you through the emergency physician on call, we will assume that you have returned to your regular daily activities without incident.  SIGNATURES/CONFIDENTIALITY: You and/or your care partner have signed paperwork which will be entered into your electronic medical record.  These signatures attest to the fact that that the information above on your After Visit Summary has been reviewed and is understood.  Full responsibility of the confidentiality   of this discharge information lies with you and/or your care-partner.     

## 2012-07-03 NOTE — Progress Notes (Signed)
Patient did not experience any of the following events: a burn prior to discharge; a fall within the facility; wrong site/side/patient/procedure/implant event; or a hospital transfer or hospital admission upon discharge from the facility. (G8907) Patient did not have preoperative order for IV antibiotic SSI prophylaxis. (G8918)  

## 2012-07-03 NOTE — Progress Notes (Signed)
Patient nauseated, no vomitus. Dr. Fuller Plan in to see the patient, Zofran ordered.0920zofran 4 mg in 20 ml of NS given over 5 minutes. Patient reports improvement. HOB elevated to 30 degrees.

## 2012-07-03 NOTE — Op Note (Signed)
[facility]  [address] [place], [address]   COLONOSCOPY PROCEDURE REPORT  PATIENT: [name] [name], [name] [name]  MR#: [id number] BIRTHDATE: 1950-05-28 , 66  yrs. old GENDER: Male ENDOSCOPIST: Ladene Artist, MD, [facility] PROCEDURE DATE:  07/03/2012 PROCEDURE:   Colonoscopy with snare polypectomy and Hemorrhoidectomy via sclerosing ASA CLASS:   Class II INDICATIONS:hematochezia and heme-positive stool. MEDICATIONS: MAC sedation, administered by CRNA and propofol (Diprivan) 300mg  IV DESCRIPTION OF PROCEDURE:   After the risks benefits and alternatives of the procedure were thoroughly explained, informed consent was obtained.  A digital rectal exam revealed no abnormalities of the rectum.   The LB [id number]  endoscope was introduced through the anus and advanced to the cecum, which was identified by both the appendix and ileocecal valve. No adverse events experienced.   The quality of the prep was excellent, using MoviPrep  The instrument was then slowly withdrawn as the colon was fully examined.  COLON FINDINGS: A sessile polyp measuring 5 mm in size was found in the transverse colon.  A polypectomy was performed with a cold snare.  The resection was complete and the polyp tissue was completely retrieved.   Mild sigmoid colon diverticulosis was noted.   The colon was otherwise normal.  There was no diverticulosis, inflammation, polyps or cancers unless previously stated.  Retroflexed views revealed moderate internal hemorrhoids. 2.5 cc of 23.4% saline was injected into the internal hemorrhoids well above the dentate line. The time to cecum=2 minutes 30 seconds.  Withdrawal time=13 minutes 24 seconds.  The scope was withdrawn and the procedure completed.  COMPLICATIONS: There were no complications.  ENDOSCOPIC IMPRESSION: 1.   Sessile polyp measuring 5 mm in the transverse colon; polypectomy performed with a cold snare 2.   Mild sigmoid colon diverticulosis 3.    Moderate internal hemorrhoids; injection sclerosis  RECOMMENDATIONS: 1.  Await pathology results 2.  Repeat colonoscopy in 5 years if polyp adenomatous; otherwise 10 years   eSigned:  Ladene Artist, MD, [facility] 07/03/2012 9:09 AM

## 2012-07-04 ENCOUNTER — Telehealth: Payer: Self-pay | Admitting: *Deleted

## 2012-07-04 NOTE — Telephone Encounter (Signed)
  Follow up Call-  Call back number 07/03/2012 02/25/2011  Post procedure Call Back phone  # [phone]  Permission to leave phone message Yes -      Patient questions:  Do you have a fever, pain , or abdominal swelling? no Pain Score  0 *  Have you tolerated food without any problems? yes  Have you been able to return to your normal activities? yes  Do you have any questions about your discharge instructions: Diet   no Medications  no Follow up visit  no  Do you have questions or concerns about your Care? no  Actions: * If pain score is 4 or above: No action needed, pain <4.  Patient is sleeping, spoke with wife. She states he went to ER last night because of fever of 103. She said he was treated with antibiotics and had xrays done. All test normal. So once his fever went down ER discharged him. She states they did talk with Dr.Stark and ER did also. At this time wife states he is "cool". Told her he should rest today and drink plenty of fluids and just watch him for symptoms. Encouraged them to call us back if any problems occur. She understands.

## 2012-07-10 ENCOUNTER — Encounter: Payer: Self-pay | Admitting: Gastroenterology

## 2012-07-10 LAB — CULTURE, BLOOD (ROUTINE X 2): Culture: NO GROWTH

## 2012-07-18 ENCOUNTER — Telehealth: Payer: Self-pay | Admitting: Gastroenterology

## 2012-07-18 NOTE — Telephone Encounter (Signed)
Patient advised of Dr. Lynne Leader recommendations.  He will call back for continued bleeding or new symptoms

## 2012-07-18 NOTE — Telephone Encounter (Signed)
Patient reports that he had one episode of rectal bleeding today.  Notes that the water in the commode was " discolored".  He denies pain, blood on the tissue or other GI complaints .  He is advised to notify if there is any additional bleeding or worsening of the bleeding.  He is asked to make sure that he is drinking plenty of fluids and use a stool softener if his stools are hard.  Dr. Fuller Plan he did have a polyp removed on 07/03/12.  Please advise .

## 2012-07-18 NOTE — Telephone Encounter (Signed)
The polyp removed was small and is unlikely to cause bleeding Internal hemorrhoids were injected-either hemorrhoids or the injection may have caused some bleeding Please monitor and note that the indication for colonoscopy was for hematochezia and hemorrhoids were the cause

## 2012-08-23 ENCOUNTER — Ambulatory Visit (HOSPITAL_BASED_OUTPATIENT_CLINIC_OR_DEPARTMENT_OTHER): Payer: BC Managed Care – PPO | Admitting: Hematology & Oncology

## 2012-08-23 ENCOUNTER — Other Ambulatory Visit (HOSPITAL_BASED_OUTPATIENT_CLINIC_OR_DEPARTMENT_OTHER): Payer: BC Managed Care – PPO | Admitting: Lab

## 2012-08-23 VITALS — BP 125/73 | HR 79 | Temp 97.9°F | Resp 18 | Ht 70.0 in | Wt 229.0 lb

## 2012-08-23 DIAGNOSIS — C8583 Other specified types of non-Hodgkin lymphoma, intra-abdominal lymph nodes: Secondary | ICD-10-CM

## 2012-08-23 LAB — CBC WITH DIFFERENTIAL (CANCER CENTER ONLY)
BASO#: 0 10*3/uL (ref 0.0–0.2)
BASO%: 0.6 % (ref 0.0–2.0)
EOS%: 1.3 % (ref 0.0–7.0)
HCT: 42.1 % (ref 38.7–49.9)
HGB: 14.2 g/dL (ref 13.0–17.1)
LYMPH%: 39.5 % (ref 14.0–48.0)
MCHC: 33.7 g/dL (ref 32.0–35.9)
MCV: 92 fL (ref 82–98)
MONO#: 0.7 10*3/uL (ref 0.1–0.9)
NEUT%: 48.4 % (ref 40.0–80.0)
RDW: 13.2 % (ref 11.1–15.7)

## 2012-08-23 LAB — LACTATE DEHYDROGENASE: LDH: 167 U/L (ref 94–250)

## 2012-08-23 LAB — CHCC SATELLITE - SMEAR

## 2012-08-23 LAB — COMPREHENSIVE METABOLIC PANEL
ALT: 20 U/L (ref 0–53)
BUN: 18 mg/dL (ref 6–23)
CO2: 24 mEq/L (ref 19–32)
Creatinine, Ser: 1.13 mg/dL (ref 0.50–1.35)
Total Bilirubin: 0.6 mg/dL (ref 0.3–1.2)

## 2012-08-23 NOTE — Progress Notes (Signed)
This office note has been dictated.

## 2012-08-24 NOTE — Progress Notes (Signed)
CC:   [name] [name] Plan, MD, [name] [name]. Hopper, MD,FACP,FCCP  DIAGNOSIS:  Marginal zone lymphoma, clinical remission.  CURRENT THERAPY:  Observation.  INTERIM HISTORY:  [name] comes in for his followup.  He is doing okay.  We last saw him back in early March.  He did have a colonoscopy recently.  Apparently he was having some bright red blood per rectum.  Dr. Fuller Plan did the colonoscopy.  Everything looked okay on the colonoscopy.  There were no masses noted.  He did have a polyp.  This was biopsied and not malignant.  He had some internal hemorrhoids.  Again, there was no malignancy noted.  He does not need another colostomy for 5 years.  He has had no leg swelling.  He has had no rashes.  There has been no cough or shortness of breath.  He has had no bony pain.  Of note, he does have some diverticulosis.  PHYSICAL EXAMINATION:  General:  This is a well-developed, well- nourished white gentleman in no obvious distress.  Vital signs:  Show temperature of 97.9, pulse 79, respiratory rate 18, blood pressure 125/73.  Weight is 229.  Head and neck:  Shows a normocephalic, atraumatic skull.  There are no ocular or oral lesions.  There are no palpable cervical or supraclavicular lymph nodes.  Lungs:  Clear bilaterally.  Cardiac:  Regular rate and rhythm with a normal S1 and S2. There are no murmurs, rubs or bruits.  Abdomen:  Soft with good bowel sounds.  There is no palpable abdominal mass.  There is no palpable hepatosplenomegaly.  Back:  No tenderness over the spine, ribs or hips. Extremities:  Show no clubbing, cyanosis or edema.  Skin:  No rashes, ecchymoses or petechiae.  LABORATORY STUDIES:  White cell count is 6.4, hemoglobin 14.2, hematocrit 42.1, platelet count is 200,000.  White cell differential shows 48 segs, 39 lymphs, 10 monos.  IMPRESSION:  [name] is a very nice 62 year old white gentleman with marginal zone lymphoma.  He was treated with R-CVP.  He  completed this back in February of 2013.  I do not think that we would derive a lot of benefit from maintenance Rituxan so he is being followed.  We will plan to get him back in 4 months.  I do not think we need any blood work in-between visits.    ______________________________ [name], M.D. PRE/MEDQ  D:  08/23/2012  T:  08/24/2012  Job:  [id number]

## 2012-11-27 ENCOUNTER — Telehealth: Payer: Self-pay | Admitting: Internal Medicine

## 2012-11-27 ENCOUNTER — Ambulatory Visit (INDEPENDENT_AMBULATORY_CARE_PROVIDER_SITE_OTHER): Payer: BC Managed Care – PPO | Admitting: Family Medicine

## 2012-11-27 ENCOUNTER — Encounter: Payer: Self-pay | Admitting: Family Medicine

## 2012-11-27 VITALS — BP 138/80 | HR 76 | Temp 98.7°F | Wt 236.0 lb

## 2012-11-27 DIAGNOSIS — J209 Acute bronchitis, unspecified: Secondary | ICD-10-CM

## 2012-11-27 DIAGNOSIS — R0789 Other chest pain: Secondary | ICD-10-CM

## 2012-11-27 MED ORDER — METHYLPREDNISOLONE ACETATE 80 MG/ML IJ SUSP
80.0000 mg | Freq: Once | INTRAMUSCULAR | Status: AC
  Administered 2012-11-27: 80 mg via INTRAMUSCULAR

## 2012-11-27 MED ORDER — ALBUTEROL SULFATE (2.5 MG/3ML) 0.083% IN NEBU
2.5000 mg | INHALATION_SOLUTION | Freq: Once | RESPIRATORY_TRACT | Status: AC
  Administered 2012-11-27: 2.5 mg via RESPIRATORY_TRACT

## 2012-11-27 MED ORDER — PREDNISONE 10 MG PO TABS: ORAL_TABLET | ORAL | Status: DC

## 2012-11-27 MED ORDER — AZITHROMYCIN 250 MG PO TABS: ORAL_TABLET | ORAL | Status: DC

## 2012-11-27 NOTE — Patient Instructions (Addendum)

## 2012-11-27 NOTE — Progress Notes (Signed)
  Subjective:     [name] [name] is a 62 y.o. male here for evaluation of a cough. Onset of symptoms was 5 days ago. Symptoms have been gradually worsening since that time. The cough is productive and is aggravated by reclining position. Associated symptoms include: shortness of breath, sputum production and wheezing. Patient does not have a history of asthma. Patient does not have a history of environmental allergens. Patient has not traveled recently. Patient does not have a history of smoking. Patient has had a previous chest x-ray. Patient has not had a PPD done.  The following portions of the patient's history were reviewed and updated as appropriate: allergies, current medications, past family history, past medical history, past social history, past surgical history and problem list.  Review of Systems Pertinent items are noted in HPI.    Objective:    Oxygen saturation 97% on room air BP 138/80  Pulse 76  Temp(Src) 98.7 F (37.1 C) (Oral)  Wt 236 lb (107.049 kg)  BMI 33.86 kg/m2  SpO2 97% General appearance: alert, cooperative, appears stated age and no distress Ears: normal TM's and external ear canals both ears Nose: clear discharge, turbinates red, swollen, no sinus tenderness Throat: lips, mucosa, and tongue normal; teeth and gums normal Neck: no adenopathy, supple, symmetrical, trachea midline and thyroid not enlarged, symmetric, no tenderness/mass/nodules Lungs: wheezes bilaterally Heart: S1, S2 normal    Assessment:    Acute Bronchitis    Plan:    Antibiotics per medication orders. Antitussives per medication orders. Avoid exposure to tobacco smoke and fumes. B-agonist inhaler. Call if shortness of breath worsens, blood in sputum, change in character of cough, development of fever or chills, inability to maintain nutrition and hydration. Avoid exposure to tobacco smoke and fumes. f/u prn

## 2012-11-27 NOTE — Telephone Encounter (Signed)
Encounter closed per protcoll due to pt having an appt with Hopp.

## 2012-11-27 NOTE — Telephone Encounter (Signed)
Patient Information:  Caller Name: Dejaun  Phone: [phone]  Patient: [name] [name], [name] [name]  Gender: Male  DOB: [birth year]  Age: 62 Years  PCP: Unice Cobble  Office Follow Up:  Does the office need to follow up with this patient?: No  Instructions For The Office: N/A  RN Note:  no appt found with Dr Linna Darner .  Symptoms  Reason For Call & Symptoms: cough.  Pt reports he has cough so much that he is having chest soreness now.  Pt reports SOB with exertion.  Pt also reports that he is not sleeping well  Reviewed Health History In EMR: Yes  Reviewed Medications In EMR: Yes  Reviewed Allergies In EMR: Yes  Reviewed Surgeries / Procedures: Yes  Date of Onset of Symptoms: 11/24/2012  Treatments Tried: Ibuprofen  Treatments Tried Worked: No  Guideline(s) Used:  Cough  Disposition Per Guideline:   Go to Office Now  Reason For Disposition Reached:   Wheezing is present  Advice Given:  N/A  Patient Will Follow Care Advice:  YES  Appointment Scheduled:  11/27/2012 14:45:00 Appointment Scheduled Provider:  Unice Cobble (RN scheduled first available appt)

## 2012-12-05 ENCOUNTER — Other Ambulatory Visit: Payer: Self-pay | Admitting: Internal Medicine

## 2012-12-12 NOTE — Telephone Encounter (Signed)
Med filled and letter mailed to inform of need for appt.

## 2012-12-14 ENCOUNTER — Other Ambulatory Visit: Payer: Self-pay | Admitting: Internal Medicine

## 2012-12-14 NOTE — Telephone Encounter (Signed)
Med filled. Letter mailed to pt for OV and labs.

## 2012-12-20 ENCOUNTER — Other Ambulatory Visit (HOSPITAL_BASED_OUTPATIENT_CLINIC_OR_DEPARTMENT_OTHER): Payer: BC Managed Care – PPO | Admitting: Lab

## 2012-12-20 ENCOUNTER — Ambulatory Visit (HOSPITAL_BASED_OUTPATIENT_CLINIC_OR_DEPARTMENT_OTHER): Payer: BC Managed Care – PPO | Admitting: Hematology & Oncology

## 2012-12-20 VITALS — BP 136/73 | HR 67 | Temp 98.1°F | Resp 18 | Ht 70.0 in | Wt 231.0 lb

## 2012-12-20 DIAGNOSIS — C8583 Other specified types of non-Hodgkin lymphoma, intra-abdominal lymph nodes: Secondary | ICD-10-CM

## 2012-12-20 LAB — CBC WITH DIFFERENTIAL (CANCER CENTER ONLY)
BASO#: 0 10*3/uL (ref 0.0–0.2)
Eosinophils Absolute: 0.1 10*3/uL (ref 0.0–0.5)
HGB: 13.7 g/dL (ref 13.0–17.1)
LYMPH#: 2.4 10*3/uL (ref 0.9–3.3)
MCH: 31 pg (ref 28.0–33.4)
MONO#: 0.6 10*3/uL (ref 0.1–0.9)
NEUT#: 3.3 10*3/uL (ref 1.5–6.5)
Platelets: 156 10*3/uL (ref 145–400)
RBC: 4.42 10*6/uL (ref 4.20–5.70)
WBC: 6.4 10*3/uL (ref 4.0–10.0)

## 2012-12-20 LAB — CHCC SATELLITE - SMEAR

## 2012-12-20 NOTE — Progress Notes (Signed)
This office note has been dictated.

## 2012-12-21 NOTE — Progress Notes (Signed)
CC:   Darrick Penna. Linna Darner, MD,FACP,FCCP Pricilla Riffle. Fuller Plan, MD, [facility]  DIAGNOSIS:  Marginal zone lymphoma -- clinical remission.  CURRENT THERAPY:  Observation.  INTERIM HISTORY:  [name] comes in for followup.  We last saw him back in June.  He had a decent summer.  He had no problems over the summer. He has noted some occasional discomfort in the left upper quadrant. This comes and goes.  It is not related to eating.  There is no change in bowel or bladder habits.  He has had no nausea and vomiting associated with this.  He has not noted any problems with cough.  He has had no fever.  He has had no rashes.  His last set of scans was done back 1-1/2 years ago in January 2013.  PHYSICAL EXAMINATION:  General:  This is a well-developed, well- nourished white gentleman in no obvious distress.  Vital signs: Temperature of 98.1, pulse 67, respiratory rate 18, blood pressure 136/73.  Weight is 231 pounds.  Head and neck:  Normocephalic, atraumatic skull.  There are no ocular or oral lesions.  There are no palpable cervical or supraclavicular lymph nodes.  Lungs:  Clear bilaterally.  Cardiac:  Regular rate and rhythm with a normal S1 and S2. There are no murmurs, rubs, or bruits.  Abdomen:  Soft.  He has good bowel sounds.  There is no fluid wave.  There is not much in the way of tenderness in the left upper quadrant.  I cannot palpate any palpable hepatomegaly.  I cannot palpate his spleen tip.  Back:  No tenderness over the spine, ribs, or hips.  Extremities:  No clubbing, cyanosis, or edema.  Neurological:  No focal neurological deficits.  Skin:  No rashes, ecchymosis, or petechia.  LABORATORY STUDIES:  White cell count is 6.4, hemoglobin 13.7, hematocrit 40, platelet count 156.  MCV is 91.  I looked at his blood smear.  I did not see anything that was unusual. He had good maturation of his white blood cells.  I did not see an excess of lymphocytes.  Red cells were with no nucleated  red blood cells.  He had a normochromic, normocytic population of red blood cells. Platelets were adequate in number and size.  Platelets are well granulated.  IMPRESSION:  [name] is a very nice 62 year old gentleman with marginal zone lymphoma.  We went ahead and treated him.  He had, I think, 6 cycles of chemotherapy with R-CVP.  He completed this back in February 2013.  We got him into remission.  Bone marrow was negative.  Scans were all negative.  I did not think that maintenance therapy was really needed, given his great response.  I told him we could do an ultrasound of his abdomen if this pain continues.  He will let me know.  I will go ahead and plan to get him back in 6 months now.  I think would be a reasonable amount of time for followup.    ______________________________ [name], M.D. PRE/MEDQ  D:  12/20/2012  T:  12/21/2012  Job:  [id number]

## 2013-01-24 ENCOUNTER — Other Ambulatory Visit: Payer: Self-pay

## 2013-03-06 ENCOUNTER — Encounter: Payer: Self-pay | Admitting: Internal Medicine

## 2013-03-06 ENCOUNTER — Ambulatory Visit (INDEPENDENT_AMBULATORY_CARE_PROVIDER_SITE_OTHER): Payer: 59 | Admitting: Internal Medicine

## 2013-03-06 ENCOUNTER — Telehealth: Payer: Self-pay | Admitting: *Deleted

## 2013-03-06 VITALS — BP 118/75 | HR 70 | Temp 98.6°F | Wt 231.0 lb

## 2013-03-06 DIAGNOSIS — B9789 Other viral agents as the cause of diseases classified elsewhere: Secondary | ICD-10-CM

## 2013-03-06 DIAGNOSIS — B349 Viral infection, unspecified: Secondary | ICD-10-CM

## 2013-03-06 MED ORDER — PRAVASTATIN SODIUM 40 MG PO TABS: ORAL_TABLET | ORAL | Status: DC

## 2013-03-06 MED ORDER — LEVOTHYROXINE SODIUM 75 MCG PO TABS: ORAL_TABLET | ORAL | Status: DC

## 2013-03-06 MED ORDER — AMOXICILLIN 500 MG PO CAPS: 1000.0000 mg | ORAL_CAPSULE | Freq: Two times a day (BID) | ORAL | Status: DC

## 2013-03-06 NOTE — Progress Notes (Signed)
   Subjective:    Patient ID: [name] [name], male    DOB: 06/16/1950, 62 y.o.   MRN: [id number]  HPI Acute visit Symptoms started 5 days ago with cough, fever up to 100, yellowish sputum production. He has been having fever on and off, fever decreased with ibuprofen. Cough decrease w/ delsyum  Past Medical History  Diagnosis Date  . Hyperlipidemia   . Thyroid disease 2007    hypothyroidism  . 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 Hemorrhoids   . Arthritis    Past Surgical History  Procedure Laterality Date  . Colonoscopy  2008    Dr Fuller Plan, due 2018  . Tonsillectomy    . Portacath placement  Dec 2012    REMOVED spring 2014     Review of Systems Denies nausea, vomiting, diarrhea. No chest pain or difficulty breathing Mild  nasal discharge. Has a history of lymphoma, no recent chemotherapy, essentially on remission to the patient's knowledge     Objective:   Physical Exam BP 118/75  Pulse 70  Temp(Src) 98.6 F (37 C)  Wt 231 lb (104.781 kg)  SpO2 97% General -- alert, well-developed, NAD.  HEENT-- Not pale. TMs normal, throat symmetric, no redness or discharge. Face symmetric, sinuses not tender to palpation. Nose moderately congested.  Lungs -- normal respiratory effort, no intercostal retractions, no accessory muscle use, and normal breath sounds.  Heart-- normal rate, regular rhythm, no murmur.   Extremities-- no pretibial edema bilaterally  Neurologic--  alert & oriented X3.   Psych-- Cognition and judgment appear intact. Cooperative with normal attention span and concentration. No anxious appearing , no depressed appearing.      Assessment & Plan:  Viral syndrome, Conservative treatment, amoxicillin if not improving.  Request a refill of her Synthroid and Pravachol, rx provided, needs to see PCP. Patient aware.

## 2013-03-06 NOTE — Patient Instructions (Signed)
Rest, fluids , tylenol For cough, take Mucinex DM twice a day as needed  For congestion use OTC Nasocort: 2 nasal sprays on each side of the nose daily until you feel better  Take the antibiotic as prescribed  (Amoxicillin) if no better in 3-4 days Call if no better in few days Call anytime if the symptoms are severe   Please schedule a office visit with Dr Linna Darner within a month

## 2013-03-06 NOTE — Progress Notes (Signed)
Pre visit review using our clinic review tool, if applicable. No additional management support is needed unless otherwise documented below in the visit note. 

## 2013-03-06 NOTE — Telephone Encounter (Signed)
Pt called and stated that he has had a fever and cough that developed over the weekend. Appt was made with Dr. Larose Kells this afternoon at 2 pm. JG//CMA

## 2013-03-12 ENCOUNTER — Other Ambulatory Visit: Payer: Self-pay | Admitting: Internal Medicine

## 2013-03-12 NOTE — Telephone Encounter (Signed)
Levothyroxine refilled per protocol. JG//CMA 

## 2013-04-08 ENCOUNTER — Encounter: Payer: Self-pay | Admitting: Internal Medicine

## 2013-04-08 ENCOUNTER — Ambulatory Visit (INDEPENDENT_AMBULATORY_CARE_PROVIDER_SITE_OTHER): Payer: 59 | Admitting: Internal Medicine

## 2013-04-08 VITALS — BP 150/85 | HR 80 | Temp 98.0°F | Resp 12 | Wt 236.6 lb

## 2013-04-08 DIAGNOSIS — R7309 Other abnormal glucose: Secondary | ICD-10-CM | POA: Insufficient documentation

## 2013-04-08 DIAGNOSIS — M7580 Other shoulder lesions, unspecified shoulder: Secondary | ICD-10-CM

## 2013-04-08 DIAGNOSIS — D126 Benign neoplasm of colon, unspecified: Secondary | ICD-10-CM | POA: Insufficient documentation

## 2013-04-08 DIAGNOSIS — E785 Hyperlipidemia, unspecified: Secondary | ICD-10-CM

## 2013-04-08 DIAGNOSIS — M7541 Impingement syndrome of right shoulder: Secondary | ICD-10-CM

## 2013-04-08 DIAGNOSIS — M25819 Other specified joint disorders, unspecified shoulder: Secondary | ICD-10-CM

## 2013-04-08 DIAGNOSIS — E039 Hypothyroidism, unspecified: Secondary | ICD-10-CM

## 2013-04-08 DIAGNOSIS — R030 Elevated blood-pressure reading, without diagnosis of hypertension: Secondary | ICD-10-CM

## 2013-04-08 NOTE — Assessment & Plan Note (Signed)
Lipids,CK & LFTs

## 2013-04-08 NOTE — Assessment & Plan Note (Signed)
BMET See BP goals

## 2013-04-08 NOTE — Assessment & Plan Note (Signed)
A1c

## 2013-04-08 NOTE — Progress Notes (Signed)
Subjective:    Patient ID: [name] GORKA, male    DOB: 1951-01-24, 63 y.o.   MRN: [id number]  HPI See BP; PMH of ELEVATED BP W/O DX OF HYPERTENSION: Blood pressure average- 120/70 Medication Compliance-no meds to date Chest pain, palpitations-  no      Dyspnea- no Edema- no Lightheadedness,Syncope- no  FASTING HYPERGLYCEMIA  :  FBS range/average-no monitor Medication compliance-no Polyuria/phagia/dipsia-  no     Blurred vision /diplopia/lossof vision-presbyopia Limb numbness/tingling/burning-see below Non healing skin lesions-no  HYPERLIPIDEMIA: Disease Monitoring: See symptoms for Hypertension Medication Compliance- yes Abd pain, bowel changes- no  Memory loss-no  HYPOTHYROIDISM: Patient is here to monitor thyroid status There has been no change in the dose, brand, mode of administration of thyroid supplement No palpitations; racing; irregularity No constipation; diarrhea;hoarseness;dysphagia No change in nails,hair,skin No temperature intolerance to heat ,cold      Review of Systems R shoulder pain began 03/20/13 without associated injury or trigger except lifting & carrying 15 bags of mulch weighing 50 # on 12/30 It is described as dull , aching  up to level 5 The pain  radiates  to deltoid from scapula The discomfort lasts all day It is exacerbated by pulling  No associated  redness ,swelling, stiffness ,skin color change, temperature change The pain was treated with NSAIDS ,Tylenol, & heat Rx ; response was partial     No fever, chills, sweats, change in weight  No weakness; incontinence (stool/urine) Numbness and tingling in triceps& down to thumb No lymphadenopathy; abnormal bruising or bleeding          Objective:   Physical Exam Gen.: Healthy and well-nourished in appearance. Alert, appropriate and cooperative throughout exam. Appears younger than stated age  Head: Normocephalic without obvious abnormalities; pattern alopecia  Eyes: No corneal  or conjunctival inflammation noted. Pupils equal round reactive to light and accommodation. Extraocular motion intact. No lid lag , proptosis or nystagmus  Nose: External nasal exam reveals no deformity or inflammation. Nasal mucosa are pink and moist. No lesions or exudates noted.  Mouth: Oral mucosa and oropharynx reveal no lesions or exudates. Teeth in good repair. Neck: No deformities, masses, or tenderness noted. Range of motion & Thyroid normal. Lungs: Normal respiratory effort; chest expands symmetrically. Lungs are clear to auscultation without rales, wheezes, or increased work of breathing. Heart: Normal rate and rhythm. Normal S1 and S2. No gallop, click, or rub. S4 with slurring @ base. Abdomen: Bowel sounds normal; abdomen soft and nontender. No masses, organomegaly or hernias noted.                                   Musculoskeletal/extremities: No deformity or scoliosis noted of  the thoracic or lumbar spine.   No clubbing, cyanosis, edema, or significant extremity  deformity noted. Range of motion normal @ R shoulder w/o crepitus.Tone & strength normal. Hand joints normal . Fingernail health good. Able to lie down & sit up w/o help. Negative SLR bilaterally Vascular: Carotid, radial artery, dorsalis pedis and  posterior tibial pulses are full and equal. No bruits present. Neurologic: Alert and oriented x3. Deep tendon reflexes symmetrical and normal.   No cranial nerve deficit present        Skin: Intact without suspicious lesions or rashes. Lymph: No cervical, axillary lymphadenopathy present. Psych: Mood and affect are normal. Normally interactive  Assessment & Plan:  #1 See Problem List with recommendations  #2 Rshoulder impingement syndrome; physical therapy recommended

## 2013-04-08 NOTE — Progress Notes (Signed)
Pre visit review using our clinic review tool, if applicable. No additional management support is needed unless otherwise documented below in the visit note. 

## 2013-04-08 NOTE — Patient Instructions (Addendum)
Minimal Blood Pressure Goal= AVERAGE < 140/90;  Ideal is an AVERAGE < 135/85. This AVERAGE should be calculated from @ least 5-7 BP readings taken @ different times of day on different days of week. You should not respond to isolated BP readings , but rather the AVERAGE for that week .Please bring your  blood pressure cuff to office visits to verify that it is reliable.It  can also be checked against the blood pressure device at the pharmacy. Finger or wrist cuffs are not dependable; an arm cuff is.  I recommend a Physical Therapy consultation to determine optimal therapy; please inform me if you have a physician preference. Use an anti-inflammatory cream such as Aspercreme or Zostrix cream twice a day to theshoulder as needed. In lieu of this warm moist compresses or  hot water bottle can be used. Do not apply ice .

## 2013-04-08 NOTE — Assessment & Plan Note (Signed)
TSH 

## 2013-04-09 ENCOUNTER — Other Ambulatory Visit (INDEPENDENT_AMBULATORY_CARE_PROVIDER_SITE_OTHER): Payer: 59

## 2013-04-09 DIAGNOSIS — R030 Elevated blood-pressure reading, without diagnosis of hypertension: Secondary | ICD-10-CM

## 2013-04-09 DIAGNOSIS — E039 Hypothyroidism, unspecified: Secondary | ICD-10-CM

## 2013-04-09 DIAGNOSIS — E785 Hyperlipidemia, unspecified: Secondary | ICD-10-CM

## 2013-04-09 DIAGNOSIS — R7309 Other abnormal glucose: Secondary | ICD-10-CM

## 2013-04-09 LAB — BASIC METABOLIC PANEL
BUN: 20 mg/dL (ref 6–23)
CALCIUM: 9.1 mg/dL (ref 8.4–10.5)
CHLORIDE: 106 meq/L (ref 96–112)
CO2: 26 meq/L (ref 19–32)
CREATININE: 1.1 mg/dL (ref 0.4–1.5)
GFR: 70.95 mL/min (ref >60.00)
Glucose, Bld: 88 mg/dL (ref 70–99)
Potassium: 4.2 mEq/L (ref 3.5–5.1)
SODIUM: 138 meq/L (ref 135–145)

## 2013-04-09 LAB — HEPATIC FUNCTION PANEL
ALT: 19 U/L (ref 0–53)
AST: 23 U/L (ref 0–37)
Albumin: 4.3 g/dL (ref 3.5–5.2)
Alkaline Phosphatase: 49 U/L (ref 39–117)
BILIRUBIN DIRECT: 0 mg/dL (ref 0.0–0.3)
Total Bilirubin: 0.9 mg/dL (ref 0.3–1.2)
Total Protein: 7.4 g/dL (ref 6.0–8.3)

## 2013-04-09 LAB — TSH: TSH: 2.8 u[IU]/mL (ref 0.35–5.50)

## 2013-04-09 LAB — LIPID PANEL
CHOL/HDL RATIO: 4
CHOLESTEROL: 172 mg/dL (ref 0–200)
HDL: 43 mg/dL (ref >39.00)
LDL Cholesterol: 103 mg/dL — ABNORMAL HIGH (ref 0–99)
TRIGLYCERIDES: 129 mg/dL (ref 0.0–149.0)
VLDL: 25.8 mg/dL (ref 0.0–40.0)

## 2013-04-09 LAB — HEMOGLOBIN A1C: Hgb A1c MFr Bld: 5.6 % (ref 4.6–6.5)

## 2013-04-09 LAB — CK: CK TOTAL: 129 U/L (ref 7–232)

## 2013-06-12 ENCOUNTER — Other Ambulatory Visit: Payer: Self-pay | Admitting: Internal Medicine

## 2013-06-14 NOTE — Telephone Encounter (Signed)
Rx sent to the pharmacy by e-script.//AB/CMA 

## 2013-06-20 ENCOUNTER — Other Ambulatory Visit (HOSPITAL_BASED_OUTPATIENT_CLINIC_OR_DEPARTMENT_OTHER): Payer: 59 | Admitting: Lab

## 2013-06-20 ENCOUNTER — Ambulatory Visit (HOSPITAL_BASED_OUTPATIENT_CLINIC_OR_DEPARTMENT_OTHER): Payer: 59 | Admitting: Hematology & Oncology

## 2013-06-20 VITALS — BP 147/88 | HR 71 | Temp 97.9°F | Resp 18 | Ht 70.0 in | Wt 237.8 lb

## 2013-06-20 DIAGNOSIS — C8583 Other specified types of non-Hodgkin lymphoma, intra-abdominal lymph nodes: Secondary | ICD-10-CM

## 2013-06-20 LAB — CMP (CANCER CENTER ONLY)
ALK PHOS: 52 U/L (ref 26–84)
ALT(SGPT): 21 U/L (ref 10–47)
AST: 29 U/L (ref 11–38)
Albumin: 3.8 g/dL (ref 3.3–5.5)
BILIRUBIN TOTAL: 0.7 mg/dL (ref 0.20–1.60)
BUN: 17 mg/dL (ref 7–22)
CO2: 30 mEq/L (ref 18–33)
Calcium: 9 mg/dL (ref 8.0–10.3)
Chloride: 105 mEq/L (ref 98–108)
Creat: 1.2 mg/dl (ref 0.6–1.2)
GLUCOSE: 89 mg/dL (ref 73–118)
Potassium: 4.1 mEq/L (ref 3.3–4.7)
Sodium: 140 mEq/L (ref 128–145)
Total Protein: 7.1 g/dL (ref 6.4–8.1)

## 2013-06-20 LAB — CBC WITH DIFFERENTIAL (CANCER CENTER ONLY)
BASO#: 0 10*3/uL (ref 0.0–0.2)
BASO%: 0.6 % (ref 0.0–2.0)
EOS%: 2.5 % (ref 0.0–7.0)
Eosinophils Absolute: 0.2 10*3/uL (ref 0.0–0.5)
HCT: 43.2 % (ref 38.7–49.9)
HGB: 14.5 g/dL (ref 13.0–17.1)
LYMPH#: 2.6 10*3/uL (ref 0.9–3.3)
LYMPH%: 38.3 % (ref 14.0–48.0)
MCH: 30.8 pg (ref 28.0–33.4)
MCHC: 33.6 g/dL (ref 32.0–35.9)
MCV: 92 fL (ref 82–98)
MONO#: 0.7 10*3/uL (ref 0.1–0.9)
MONO%: 10.7 % (ref 0.0–13.0)
NEUT#: 3.2 10*3/uL (ref 1.5–6.5)
NEUT%: 47.9 % (ref 40.0–80.0)
Platelets: 166 10*3/uL (ref 145–400)
RBC: 4.71 10*6/uL (ref 4.20–5.70)
RDW: 12.9 % (ref 11.1–15.7)
WBC: 6.8 10*3/uL (ref 4.0–10.0)

## 2013-06-20 LAB — LACTATE DEHYDROGENASE: LDH: 206 U/L (ref 94–250)

## 2013-06-20 NOTE — Progress Notes (Signed)
Hematology and Oncology Follow Up Visit  [name] [name] [id number] 04/18/1950 63 y.o. 06/20/2013   Principle Diagnosis:   Marginal zone lymphoma-clinical remission  Current Therapy:    Observation     Interim History:  [name] [name] is back for followup. We see very 6 months. He done well. His weight had no problems since we last saw him. He's working. There is no fatigue or weakness. He's had no abdominal pain. He's had no cough or shortness of breath. There's been a mild sore arthralgias. He's had  He's had some rectal bleeding. He does have a low hemorrhoid. This tends to "come and go".  He's had no change in medications. He's had no headache. There is no visual issues.  Medications: Current outpatient prescriptions:aspirin EC 81 MG tablet, Take 81 mg by mouth at bedtime., Disp: , Rfl: ;  cholecalciferol (VITAMIN D) 1000 UNITS tablet, Take 1,000 Units by mouth daily. , Disp: , Rfl: ;  DiphenhydrAMINE HCl, Sleep, (ZZZQUIL PO), Take 1 tablet by mouth at bedtime as needed (sleep)., Disp: , Rfl:  levothyroxine (SYNTHROID, LEVOTHROID) 75 MCG tablet, Take 1 tablet by mouth daily in the morning except tuesday and thursday take 1 and 1/2 tablet as directed, Disp: 35 tablet, Rfl: 0;  levothyroxine (SYNTHROID, LEVOTHROID) 75 MCG tablet, TAKE 1 TABLET BY MOUTH DAILY INTHE MORNING EXCEPT ON TUESDAY AND THURSDAY TAKE 1 AND 1/2 TAB., Disp: 35 tablet, Rfl: 11 pravastatin (PRAVACHOL) 40 MG tablet, Take one tablet by mouth one time daily, Disp: 90 tablet, Rfl: 0  Allergies: No Known Allergies  Past Medical History, Surgical history, Social history, and Family History were reviewed and updated.  Review of Systems: As above  Physical Exam:  height is 5\' 10"  (1.778 m) and weight is 237 lb 12.8 oz (107.865 kg). His oral temperature is 97.9 F (36.6 C). His blood pressure is 147/88 and his pulse is 71. His respiration is 18.   Well-developed well-nourished done. Head and neck exam shows no ocular or oral  lesions. Has no palpable cervical or supraclavicular lymph nodes. Lungs are clear bilaterally. Cardiac exam regular rate and rhythm with no murmurs rubs or bruits. Abdomen soft. Has good bowel sounds. There is no palpable abdominal mass. There is no palpable hepatosplenomegaly back exam no tenderness over the spine ribs or hips. Extremities shows no clubbing cyanosis or edema. Has good range of motion of his joints. She has good strength in his arms legs. Skin exam no rashes ecchymoses or petechia. Neurological exam shows no focal neurological deficits.  Lab Results  Component Value Date   WBC 6.8 06/20/2013   HGB 14.5 06/20/2013   HCT 43.2 06/20/2013   MCV 92 06/20/2013   PLT 166 06/20/2013     Chemistry      Component Value Date/Time   NA 140 06/20/2013 0810   NA 138 04/09/2013 0818   K 4.1 06/20/2013 0810   K 4.2 04/09/2013 0818   CL 105 06/20/2013 0810   CL 106 04/09/2013 0818   CO2 30 06/20/2013 0810   CO2 26 04/09/2013 0818   BUN 17 06/20/2013 0810   BUN 20 04/09/2013 0818   CREATININE 1.2 06/20/2013 0810   CREATININE 1.1 04/09/2013 0818      Component Value Date/Time   CALCIUM 9.0 06/20/2013 0810   CALCIUM 9.1 04/09/2013 0818   ALKPHOS 52 06/20/2013 0810   ALKPHOS 49 04/09/2013 0818   AST 29 06/20/2013 0810   AST 23 04/09/2013 0818   ALT 21  06/20/2013 0810   ALT 19 04/09/2013 0818   BILITOT 0.70 06/20/2013 0810   BILITOT 0.9 04/09/2013 0818         Impression and Plan: [name] is a 63 year old gentleman with a marginal zone lymphoma. He was treated with chemotherapy. He got sick cycles of R.-CVP. It appears back in February 2013. His repeat bone marrow was negative for any residual lymphoma. I felt we can hold on any kind of maintenance therapy.  I still don't see any evidence of recurrence.  Will plan for another followup in 6 months.   [name], MD 4/2/20159:00 AM

## 2013-06-27 ENCOUNTER — Other Ambulatory Visit: Payer: Self-pay | Admitting: Internal Medicine

## 2013-10-28 ENCOUNTER — Telehealth: Payer: Self-pay | Admitting: Hematology & Oncology

## 2013-10-28 NOTE — Telephone Encounter (Signed)
Pt moved 10-1 to 9-28

## 2013-12-16 ENCOUNTER — Ambulatory Visit (HOSPITAL_BASED_OUTPATIENT_CLINIC_OR_DEPARTMENT_OTHER): Payer: 59 | Admitting: Hematology & Oncology

## 2013-12-16 ENCOUNTER — Other Ambulatory Visit (HOSPITAL_BASED_OUTPATIENT_CLINIC_OR_DEPARTMENT_OTHER): Payer: 59 | Admitting: Lab

## 2013-12-16 ENCOUNTER — Ambulatory Visit (HOSPITAL_BASED_OUTPATIENT_CLINIC_OR_DEPARTMENT_OTHER): Payer: 59

## 2013-12-16 ENCOUNTER — Encounter: Payer: Self-pay | Admitting: Hematology & Oncology

## 2013-12-16 ENCOUNTER — Telehealth: Payer: Self-pay

## 2013-12-16 VITALS — BP 143/94 | HR 73 | Temp 98.4°F | Resp 16 | Ht 70.0 in | Wt 229.0 lb

## 2013-12-16 DIAGNOSIS — C8583 Other specified types of non-Hodgkin lymphoma, intra-abdominal lymph nodes: Secondary | ICD-10-CM

## 2013-12-16 DIAGNOSIS — K648 Other hemorrhoids: Secondary | ICD-10-CM

## 2013-12-16 DIAGNOSIS — E039 Hypothyroidism, unspecified: Secondary | ICD-10-CM

## 2013-12-16 DIAGNOSIS — Z23 Encounter for immunization: Secondary | ICD-10-CM

## 2013-12-16 LAB — CBC WITH DIFFERENTIAL (CANCER CENTER ONLY)
BASO#: 0 10*3/uL (ref 0.0–0.2)
BASO%: 0.7 % (ref 0.0–2.0)
EOS ABS: 0.1 10*3/uL (ref 0.0–0.5)
EOS%: 2.2 % (ref 0.0–7.0)
HCT: 41.5 % (ref 38.7–49.9)
HGB: 14.2 g/dL (ref 13.0–17.1)
LYMPH#: 2.6 10*3/uL (ref 0.9–3.3)
LYMPH%: 43.7 % (ref 14.0–48.0)
MCH: 30.7 pg (ref 28.0–33.4)
MCHC: 34.2 g/dL (ref 32.0–35.9)
MCV: 90 fL (ref 82–98)
MONO#: 0.6 10*3/uL (ref 0.1–0.9)
MONO%: 9.8 % (ref 0.0–13.0)
NEUT%: 43.6 % (ref 40.0–80.0)
NEUTROS ABS: 2.6 10*3/uL (ref 1.5–6.5)
PLATELETS: 192 10*3/uL (ref 145–400)
RBC: 4.62 10*6/uL (ref 4.20–5.70)
RDW: 12.9 % (ref 11.1–15.7)
WBC: 5.9 10*3/uL (ref 4.0–10.0)

## 2013-12-16 LAB — CMP (CANCER CENTER ONLY)
ALBUMIN: 3.9 g/dL (ref 3.3–5.5)
ALT: 23 U/L (ref 10–47)
AST: 25 U/L (ref 11–38)
Alkaline Phosphatase: 51 U/L (ref 26–84)
BUN, Bld: 15 mg/dL (ref 7–22)
CO2: 25 mEq/L (ref 18–33)
Calcium: 9.2 mg/dL (ref 8.0–10.3)
Chloride: 104 mEq/L (ref 98–108)
Creat: 1.3 mg/dl — ABNORMAL HIGH (ref 0.6–1.2)
Glucose, Bld: 99 mg/dL (ref 73–118)
POTASSIUM: 4 meq/L (ref 3.3–4.7)
SODIUM: 141 meq/L (ref 128–145)
Total Bilirubin: 0.9 mg/dl (ref 0.20–1.60)
Total Protein: 7.2 g/dL (ref 6.4–8.1)

## 2013-12-16 LAB — LACTATE DEHYDROGENASE: LDH: 167 U/L (ref 94–250)

## 2013-12-16 MED ORDER — PNEUMOCOCCAL VAC POLYVALENT 25 MCG/0.5ML IJ INJ
0.5000 mL | INJECTION | Freq: Once | INTRAMUSCULAR | Status: AC
  Administered 2013-12-16: 0.5 mL via INTRAMUSCULAR
  Filled 2013-12-16: qty 0.5

## 2013-12-16 NOTE — Telephone Encounter (Signed)
Message copied by Marlon Pel on Mon Dec 16, 2013  2:41 PM ------      Message from: Lucio Edward T      Created: Mon Dec 16, 2013  2:12 PM       Dr. Marin Olp called and pt having recurrent rectal bleeding. Had colonoscopy last year with injection sclerosis of moderate sized internal hemorrhoids for same problem. Anusol HC supp bid and refer to [facility], Dr. Marcello Moores. ------

## 2013-12-16 NOTE — Telephone Encounter (Signed)
Left message for patient to call back  

## 2013-12-16 NOTE — Patient Instructions (Signed)

## 2013-12-17 MED ORDER — HYDROCORTISONE ACETATE 25 MG RE SUPP: 25.0000 mg | Freq: Two times a day (BID) | RECTAL | Status: DC

## 2013-12-17 NOTE — Telephone Encounter (Signed)
Patient notified appt for tomorrow cancelled rx sent to [facility] pharmacy appt scheduled with [facility] Dr. Marcello Moores zfor 02/06/14 11:20.  He is notified that he will need to be there at 10:50

## 2013-12-17 NOTE — Progress Notes (Signed)
Hematology and Oncology Follow Up Visit  [name] [name] [id number] 11/16/1950 63 y.o. 12/17/2013   Principle Diagnosis:  Marginal zone lymphoma-clinical remission  Current Therapy:    Observation     Interim History:  Mr.  [name] [name] is back for followup. His wife is with him. He is a when she comes, there is some issues.  He has a have some bright red blood per rectum. He does see Dr. Fuller Plan. He has had a colonoscopy in the past W. is. He does have internal hemorrhoids. I suspect that he buys bleeding from these internal hemorrhoids.  I spoke with Dr. Fuller Plan who will see him.  He's had some night sweats. He's had some pain on his left side. I think we will have to get him set up with a CT scan to make sure that there is no recurrence of his lymphoma.  He's had no rashes. He's had no bruising. Medications: Current outpatient prescriptions:aspirin EC 81 MG tablet, Take 81 mg by mouth at bedtime., Disp: , Rfl: ;  cholecalciferol (VITAMIN D) 1000 UNITS tablet, Take 1,000 Units by mouth daily. , Disp: , Rfl: ;  levothyroxine (SYNTHROID, LEVOTHROID) 75 MCG tablet, Take 1 tablet by mouth daily in the morning except tuesday and thursday take 1 and 1/2 tablet as directed, Disp: 35 tablet, Rfl: 0 Melatonin 1 MG/4ML LIQD, Take 1 mg by mouth at bedtime as needed (to aid with sleeping.)., Disp: , Rfl: ;  pravastatin (PRAVACHOL) 40 MG tablet, TAKE ONE TABLET BY MOUTH ONE TIME DAILY , Disp: 90 tablet, Rfl: 1;  hydrocortisone (ANUSOL-HC) 25 MG suppository, Place 1 suppository (25 mg total) rectally every 12 (twelve) hours., Disp: 24 suppository, Rfl: 1 levothyroxine (SYNTHROID, LEVOTHROID) 75 MCG tablet, TAKE 1 TABLET BY MOUTH DAILY INTHE MORNING EXCEPT ON TUESDAY AND THURSDAY TAKE 1 AND 1/2 TAB., Disp: 35 tablet, Rfl: 11  Allergies: No Known Allergies  Past Medical History, Surgical history, Social history, and Family History were reviewed and updated.  Review of Systems: As above  Physical Exam:  height is 5\' 10"  (1.778 m) and weight is 229 lb (103.874 kg). His oral temperature is 98.4 F (36.9 C). His blood pressure is 143/94 and his pulse is 73. His respiration is 16.   Well-developed and well-nourished white gentleman. Head and neck exam shows no ocular or oral lesions. There are no palpable cervical or supraclavicular lymph nodes. Lungs are clear. Cardiac exam regular in rhythm with no murmurs, rubs or bruits. Axillary exam shows no bilateral axillary. Abdomen is soft. She has good bowel sounds. There may be some slight tenderness over the left side. There is no splenomegaly. Liver edge is not palpable. Rectal exam shows no external hemorrhoids. There is no bleeding. He has no fissures. Extremities shows no clubbing, cyanosis or edema. Neurological exam is nonfocal. Skin exam no rashes, ecchymosis or petechia.  Lab Results  Component Value Date   WBC 5.9 12/16/2013   HGB 14.2 12/16/2013   HCT 41.5 12/16/2013   MCV 90 12/16/2013   PLT 192 12/16/2013     Chemistry      Component Value Date/Time   NA 141 12/16/2013 1208   NA 138 04/09/2013 0818   K 4.0 12/16/2013 1208   K 4.2 04/09/2013 0818   CL 104 12/16/2013 1208   CL 106 04/09/2013 0818   CO2 25 12/16/2013 1208   CO2 26 04/09/2013 0818   BUN 15 12/16/2013 1208   BUN 20 04/09/2013 0818   CREATININE 1.3* 12/16/2013  1208   CREATININE 1.1 04/09/2013 0818      Component Value Date/Time   CALCIUM 9.2 12/16/2013 1208   CALCIUM 9.1 04/09/2013 0818   ALKPHOS 51 12/16/2013 1208   ALKPHOS 49 04/09/2013 0818   AST 25 12/16/2013 1208   AST 23 04/09/2013 0818   ALT 23 12/16/2013 1208   ALT 19 04/09/2013 0818   BILITOT 0.90 12/16/2013 1208   BILITOT 0.9 04/09/2013 0818         Impression and Plan: [name] [name] is 63 year old gentleman with a history of a low-grade lymphoma. He underwent chemotherapy for this. He has done well. He got 6 cycles of R.-CVP. He completed this back in February of 2013.  His followup studies showed no evidence of  lymphoma. We did a bone marrow on him which showed no residual lymphoma.  Again, we will set him up with scans. I think these would be important for [place].  I will plan to see him back in 4 more months. If all looks good in 4 months, then we can move him to every 6 months.    [name], MD 9/29/201510:37 AM

## 2013-12-18 ENCOUNTER — Ambulatory Visit: Payer: 59 | Admitting: Gastroenterology

## 2013-12-19 ENCOUNTER — Ambulatory Visit: Payer: 59 | Admitting: Hematology & Oncology

## 2013-12-19 ENCOUNTER — Other Ambulatory Visit: Payer: 59 | Admitting: Lab

## 2013-12-22 ENCOUNTER — Other Ambulatory Visit: Payer: Self-pay | Admitting: Internal Medicine

## 2013-12-23 ENCOUNTER — Encounter (HOSPITAL_BASED_OUTPATIENT_CLINIC_OR_DEPARTMENT_OTHER): Payer: Self-pay

## 2013-12-23 ENCOUNTER — Ambulatory Visit (HOSPITAL_BASED_OUTPATIENT_CLINIC_OR_DEPARTMENT_OTHER)
Admission: RE | Admit: 2013-12-23 | Discharge: 2013-12-23 | Disposition: A | Discharge status: Home / Self Care | Payer: 59 | Source: Ambulatory Visit | Attending: Hematology & Oncology | Admitting: Hematology & Oncology

## 2013-12-23 ENCOUNTER — Ambulatory Visit (HOSPITAL_BASED_OUTPATIENT_CLINIC_OR_DEPARTMENT_OTHER)
Admission: RE | Admit: 2013-12-23 | Discharge: 2013-12-23 | Disposition: A | Discharge status: Home / Self Care | Payer: 59 | Source: Ambulatory Visit | Attending: Diagnostic Radiology | Admitting: Diagnostic Radiology

## 2013-12-23 DIAGNOSIS — C8583 Other specified types of non-Hodgkin lymphoma, intra-abdominal lymph nodes: Secondary | ICD-10-CM

## 2013-12-23 DIAGNOSIS — R61 Generalized hyperhidrosis: Secondary | ICD-10-CM | POA: Diagnosis present

## 2013-12-23 DIAGNOSIS — E039 Hypothyroidism, unspecified: Secondary | ICD-10-CM

## 2013-12-23 DIAGNOSIS — Z8572 Personal history of non-Hodgkin lymphomas: Secondary | ICD-10-CM | POA: Insufficient documentation

## 2013-12-23 MED ORDER — IOHEXOL 300 MG/ML  SOLN
100.0000 mL | Freq: Once | INTRAMUSCULAR | Status: AC | PRN
  Administered 2013-12-23: 100 mL via INTRAVENOUS

## 2013-12-24 ENCOUNTER — Telehealth: Payer: Self-pay | Admitting: Nurse Practitioner

## 2013-12-24 NOTE — Telephone Encounter (Addendum)
Message copied by Jimmy Footman on Tue Dec 24, 2013 10:41 AM ------      Message from: [name]      Created: Mon Dec 23, 2013  4:11 PM       Call - CT scan is normal!!  No lymphoma!!!  No infection!!  Pete ------Pt verbalized understanding and appreciation.

## 2013-12-25 ENCOUNTER — Other Ambulatory Visit: Payer: Self-pay | Admitting: Internal Medicine

## 2014-03-04 ENCOUNTER — Other Ambulatory Visit: Payer: Self-pay | Admitting: Nurse Practitioner

## 2014-03-27 ENCOUNTER — Other Ambulatory Visit: Payer: Self-pay

## 2014-03-27 MED ORDER — PRAVASTATIN SODIUM 40 MG PO TABS: ORAL_TABLET | ORAL | Status: DC

## 2014-04-17 ENCOUNTER — Other Ambulatory Visit (HOSPITAL_BASED_OUTPATIENT_CLINIC_OR_DEPARTMENT_OTHER): Payer: 59 | Admitting: Lab

## 2014-04-17 ENCOUNTER — Ambulatory Visit (HOSPITAL_BASED_OUTPATIENT_CLINIC_OR_DEPARTMENT_OTHER): Payer: 59 | Admitting: Hematology & Oncology

## 2014-04-17 VITALS — BP 122/62 | HR 70 | Temp 98.0°F | Resp 18 | Ht 69.0 in | Wt 235.0 lb

## 2014-04-17 DIAGNOSIS — C8583 Other specified types of non-Hodgkin lymphoma, intra-abdominal lymph nodes: Secondary | ICD-10-CM

## 2014-04-17 DIAGNOSIS — E039 Hypothyroidism, unspecified: Secondary | ICD-10-CM

## 2014-04-17 LAB — CBC WITH DIFFERENTIAL (CANCER CENTER ONLY)
BASO#: 0.1 10*3/uL (ref 0.0–0.2)
BASO%: 1 % (ref 0.0–2.0)
EOS%: 2.3 % (ref 0.0–7.0)
Eosinophils Absolute: 0.1 10*3/uL (ref 0.0–0.5)
HCT: 41 % (ref 38.7–49.9)
HEMOGLOBIN: 13.7 g/dL (ref 13.0–17.1)
LYMPH#: 2.3 10*3/uL (ref 0.9–3.3)
LYMPH%: 39.3 % (ref 14.0–48.0)
MCH: 30.9 pg (ref 28.0–33.4)
MCHC: 33.4 g/dL (ref 32.0–35.9)
MCV: 92 fL (ref 82–98)
MONO#: 0.6 10*3/uL (ref 0.1–0.9)
MONO%: 9.7 % (ref 0.0–13.0)
NEUT%: 47.7 % (ref 40.0–80.0)
NEUTROS ABS: 2.8 10*3/uL (ref 1.5–6.5)
PLATELETS: 184 10*3/uL (ref 145–400)
RBC: 4.44 10*6/uL (ref 4.20–5.70)
RDW: 12.9 % (ref 11.1–15.7)
WBC: 6 10*3/uL (ref 4.0–10.0)

## 2014-04-17 LAB — COMPREHENSIVE METABOLIC PANEL (CC13)
ALBUMIN: 4.1 g/dL (ref 3.5–5.0)
ALK PHOS: 66 U/L (ref 40–150)
ALT: 18 U/L (ref 0–55)
AST: 25 U/L (ref 5–34)
Anion Gap: 9 mEq/L (ref 3–11)
BUN: 18.9 mg/dL (ref 7.0–26.0)
CO2: 24 mEq/L (ref 22–29)
Calcium: 9.1 mg/dL (ref 8.4–10.4)
Chloride: 108 mEq/L (ref 98–109)
Creatinine: 1.2 mg/dL (ref 0.7–1.3)
EGFR: 67 mL/min/{1.73_m2} — ABNORMAL LOW (ref >90)
Glucose: 85 mg/dl (ref 70–140)
POTASSIUM: 4.3 meq/L (ref 3.5–5.1)
Sodium: 141 mEq/L (ref 136–145)
Total Bilirubin: 0.62 mg/dL (ref 0.20–1.20)
Total Protein: 7.3 g/dL (ref 6.4–8.3)

## 2014-04-17 LAB — LACTATE DEHYDROGENASE: LDH: 190 U/L (ref 94–250)

## 2014-04-17 LAB — TSH CHCC: TSH: 6.929 m(IU)/L — ABNORMAL HIGH (ref 0.320–4.118)

## 2014-04-17 NOTE — Progress Notes (Signed)
Hematology and Oncology Follow Up Visit  [name] [name] [id number] [birth year] 64 y.o. 04/17/2014   Principle Diagnosis:  Marginal zone lymphoma-clinical remission  Current Therapy:    Observation     Interim History:  [name] [name] is back for followup. He had a good Christmas and thanks giving. He was up in [place] visiting his oldest son. He has not had a good time up there.  He was working late last night. He works down to [employer]. [place] [employer]  There's been no bleeding issues. He's had no rectal issues.  He's had no cough. There's been no shortness of breath.  He takes vitamin D and aspirin.  He's had no fever. He's had no He's had no rashes. He's had no bruising. Medications:  Current outpatient prescriptions:  .  aspirin EC 81 MG tablet, Take 81 mg by mouth at bedtime., Disp: , Rfl:  .  cholecalciferol (VITAMIN D) 1000 UNITS tablet, Take 1,000 Units by mouth daily. , Disp: , Rfl:  .  levothyroxine (SYNTHROID, LEVOTHROID) 75 MCG tablet, Take 1 tablet by mouth daily in the morning except tuesday and thursday take 1 and 1/2 tablet as directed, Disp: 35 tablet, Rfl: 0 .  pravastatin (PRAVACHOL) 40 MG tablet, Take 1 tablet daily. NEEDS APPT WITH DR HOPPER FOR FURTHER REFILLS, Disp: 90 tablet, Rfl: 0 .  hydrocortisone (ANUSOL-HC) 25 MG suppository, Place 1 suppository (25 mg total) rectally every 12 (twelve) hours. (Patient not taking: Reported on 04/17/2014), Disp: 24 suppository, Rfl: 1 .  Melatonin 1 MG/4ML LIQD, Take 1 mg by mouth at bedtime as needed (to aid with sleeping.)., Disp: , Rfl:   Allergies: No Known Allergies  Past Medical History, Surgical history, Social history, and Family History were reviewed and updated.  Review of Systems: As above  Physical Exam:  height is 5\' 9"  (1.753 m) and weight is 235 lb (106.595 kg). His oral temperature is 98 F (36.7 C). His blood pressure is 122/62 and his pulse is 70. His respiration is 18.    Well-developed and well-nourished white gentleman. Head and neck exam shows no ocular or oral lesions. There are no palpable cervical or supraclavicular lymph nodes. Lungs are clear. Cardiac exam regular rate and rhythm with no murmurs, rubs or bruits. Axillary exam shows no bilateral axillary. Abdomen is soft. She has good bowel sounds. There may be some slight tenderness over the left side. There is no splenomegaly. Liver edge is not palpable. Rectal exam shows no external hemorrhoids. There is no bleeding. He has no fissures. Extremities shows no clubbing, cyanosis or edema. Neurological exam is nonfocal. Skin exam no rashes, ecchymosis or petechia.  Lab Results  Component Value Date   WBC 6.0 04/17/2014   HGB 13.7 04/17/2014   HCT 41.0 04/17/2014   MCV 92 04/17/2014   PLT 184 04/17/2014     Chemistry      Component Value Date/Time   NA 141 12/16/2013 1208   NA 138 04/09/2013 0818   K 4.0 12/16/2013 1208   K 4.2 04/09/2013 0818   CL 104 12/16/2013 1208   CL 106 04/09/2013 0818   CO2 25 12/16/2013 1208   CO2 26 04/09/2013 0818   BUN 15 12/16/2013 1208   BUN 20 04/09/2013 0818   CREATININE 1.3* 12/16/2013 1208   CREATININE 1.1 04/09/2013 0818      Component Value Date/Time   CALCIUM 9.2 12/16/2013 1208   CALCIUM 9.1 04/09/2013 0818   ALKPHOS 51  12/16/2013 1208   ALKPHOS 49 04/09/2013 0818   AST 25 12/16/2013 1208   AST 23 04/09/2013 0818   ALT 23 12/16/2013 1208   ALT 19 04/09/2013 0818   BILITOT 0.90 12/16/2013 1208   BILITOT 0.9 04/09/2013 0818         Impression and Plan: [name] is 64 year old gentleman with a history of a low-grade lymphoma. He underwent chemotherapy for this. He has done well. He got 6 cycles of R.-CVP. He completed this back in February of 2013.  He is doing quite well. He looks great. He's gained a little weight so has to watch for this.  He's had no problems with respect to lymphoma. There's been no convocation from any of the  chemotherapy.  I think we've probably get him back now in 6 months.  [name], MD 1/28/201610:35 AM

## 2014-04-18 ENCOUNTER — Telehealth: Payer: Self-pay | Admitting: *Deleted

## 2014-04-18 DIAGNOSIS — E039 Hypothyroidism, unspecified: Secondary | ICD-10-CM

## 2014-04-18 MED ORDER — LEVOTHYROXINE SODIUM 100 MCG PO TABS: 100.0000 ug | ORAL_TABLET | Freq: Every day | ORAL | Status: DC

## 2014-04-18 MED ORDER — LEVOTHYROXINE SODIUM 25 MCG PO TABS: 25.0000 ug | ORAL_TABLET | Freq: Every day | ORAL | Status: DC

## 2014-04-18 NOTE — Telephone Encounter (Addendum)
Called patient. Prescription sent in.   ----- Message from [name], MD sent at 04/17/2014  6:44 PM EST ----- Please call and let him know that his thyroid is underactive. Please increase his Synthroid dose to 100 g a day. Please call and the new prescription for him. Thanks

## 2014-06-25 ENCOUNTER — Other Ambulatory Visit: Payer: Self-pay | Admitting: Internal Medicine

## 2014-09-15 ENCOUNTER — Other Ambulatory Visit: Payer: Self-pay | Admitting: Hematology & Oncology

## 2014-09-20 ENCOUNTER — Other Ambulatory Visit: Payer: Self-pay | Admitting: Hematology & Oncology

## 2014-09-20 ENCOUNTER — Other Ambulatory Visit: Payer: Self-pay | Admitting: Internal Medicine

## 2014-09-23 ENCOUNTER — Other Ambulatory Visit: Payer: Self-pay | Admitting: Internal Medicine

## 2014-09-23 ENCOUNTER — Other Ambulatory Visit: Payer: Self-pay | Admitting: Emergency Medicine

## 2014-09-23 DIAGNOSIS — E785 Hyperlipidemia, unspecified: Secondary | ICD-10-CM

## 2014-09-23 DIAGNOSIS — E039 Hypothyroidism, unspecified: Secondary | ICD-10-CM

## 2014-09-23 DIAGNOSIS — R7309 Other abnormal glucose: Secondary | ICD-10-CM

## 2014-09-23 MED ORDER — PRAVASTATIN SODIUM 40 MG PO TABS: 40.0000 mg | ORAL_TABLET | Freq: Every day | ORAL | Status: DC

## 2014-09-23 NOTE — Telephone Encounter (Signed)
#  30 only  Needs fasting labs & F/U OV

## 2014-09-23 NOTE — Telephone Encounter (Signed)
Last office visit jan/2015---please advise, thanks

## 2014-09-24 ENCOUNTER — Other Ambulatory Visit: Payer: Self-pay

## 2014-09-24 MED ORDER — PRAVASTATIN SODIUM 40 MG PO TABS: 40.0000 mg | ORAL_TABLET | Freq: Every day | ORAL | Status: DC

## 2014-09-25 ENCOUNTER — Other Ambulatory Visit: Payer: Self-pay | Admitting: Internal Medicine

## 2014-10-16 ENCOUNTER — Other Ambulatory Visit (HOSPITAL_BASED_OUTPATIENT_CLINIC_OR_DEPARTMENT_OTHER): Payer: Commercial Managed Care - HMO

## 2014-10-16 ENCOUNTER — Ambulatory Visit (HOSPITAL_BASED_OUTPATIENT_CLINIC_OR_DEPARTMENT_OTHER): Payer: Commercial Managed Care - HMO | Admitting: Hematology & Oncology

## 2014-10-16 ENCOUNTER — Encounter: Payer: Self-pay | Admitting: Hematology & Oncology

## 2014-10-16 VITALS — BP 121/79 | HR 67 | Temp 98.2°F | Resp 18 | Ht 69.0 in | Wt 230.0 lb

## 2014-10-16 DIAGNOSIS — C8583 Other specified types of non-Hodgkin lymphoma, intra-abdominal lymph nodes: Secondary | ICD-10-CM

## 2014-10-16 DIAGNOSIS — Z8572 Personal history of non-Hodgkin lymphomas: Secondary | ICD-10-CM

## 2014-10-16 DIAGNOSIS — E038 Other specified hypothyroidism: Secondary | ICD-10-CM

## 2014-10-16 LAB — CBC WITH DIFFERENTIAL (CANCER CENTER ONLY)
BASO#: 0.1 10*3/uL (ref 0.0–0.2)
BASO%: 0.9 % (ref 0.0–2.0)
EOS%: 2.6 % (ref 0.0–7.0)
Eosinophils Absolute: 0.2 10*3/uL (ref 0.0–0.5)
HCT: 41.9 % (ref 38.7–49.9)
HGB: 14.5 g/dL (ref 13.0–17.1)
LYMPH#: 2.9 10*3/uL (ref 0.9–3.3)
LYMPH%: 42 % (ref 14.0–48.0)
MCH: 31.3 pg (ref 28.0–33.4)
MCHC: 34.6 g/dL (ref 32.0–35.9)
MCV: 91 fL (ref 82–98)
MONO#: 0.6 10*3/uL (ref 0.1–0.9)
MONO%: 8 % (ref 0.0–13.0)
NEUT%: 46.5 % (ref 40.0–80.0)
NEUTROS ABS: 3.3 10*3/uL (ref 1.5–6.5)
PLATELETS: 193 10*3/uL (ref 145–400)
RBC: 4.63 10*6/uL (ref 4.20–5.70)
RDW: 12.9 % (ref 11.1–15.7)
WBC: 7 10*3/uL (ref 4.0–10.0)

## 2014-10-16 LAB — COMPREHENSIVE METABOLIC PANEL
ALK PHOS: 54 U/L (ref 40–115)
ALT: 19 U/L (ref 9–46)
AST: 22 U/L (ref 10–35)
Albumin: 4.4 g/dL (ref 3.6–5.1)
BILIRUBIN TOTAL: 0.7 mg/dL (ref 0.2–1.2)
BUN: 17 mg/dL (ref 7–25)
CO2: 20 mEq/L (ref 20–31)
Calcium: 9.6 mg/dL (ref 8.6–10.3)
Chloride: 105 mEq/L (ref 98–110)
Creatinine, Ser: 1.23 mg/dL (ref 0.70–1.25)
GLUCOSE: 102 mg/dL — AB (ref 65–99)
Potassium: 4.4 mEq/L (ref 3.5–5.3)
Sodium: 138 mEq/L (ref 135–146)
Total Protein: 7.1 g/dL (ref 6.1–8.1)

## 2014-10-16 LAB — LACTATE DEHYDROGENASE: LDH: 218 U/L (ref 94–250)

## 2014-10-16 LAB — VITAMIN D 25 HYDROXY (VIT D DEFICIENCY, FRACTURES): Vit D, 25-Hydroxy: 32 ng/mL (ref 30–100)

## 2014-10-16 NOTE — Progress Notes (Signed)
Hematology and Oncology Follow Up Visit  JANTZEN HEON [id number] March 17, 1951 64 y.o. 10/16/2014   Principle Diagnosis:  Marginal zone lymphoma-clinical remission  Current Therapy:    Observation     Interim History:  Mr.  [name] [name] is back for followup. He looks good. He feels pretty good. He's had no issues since we last saw him. He is on Synthroid.  He was up visiting his son in [place]. He had a good time while he was up there.  His wife is doing well. She and he hopefully will be able to go on vacation soon.  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  He's had no fever. He's had no cough. He's had no nausea or vomiting.  He's had no further hemorrhoidal issues. He's had no bleeding.  Overall, his performance status is ECOG 0.  He takes vitamin D and aspirin. . Medications:  Current outpatient prescriptions:  .  aspirin EC 81 MG tablet, Take 81 mg by mouth at bedtime., Disp: , Rfl:  .  cholecalciferol (VITAMIN D) 1000 UNITS tablet, Take 1,000 Units by mouth daily. , Disp: , Rfl:  .  levothyroxine (SYNTHROID, LEVOTHROID) 100 MCG tablet, TAKE ONE TABLET BY MOUTH ONE TIME DAILY BEFORE BREAKFAST, Disp: 30 tablet, Rfl: 3 .  pravastatin (PRAVACHOL) 40 MG tablet, TAKE ONE TABLET BY MOUTH ONE TIME DAILY, Disp: 30 tablet, Rfl: 0  Allergies: No Known Allergies  Past Medical History, Surgical history, Social history, and Family History were reviewed and updated.  Review of Systems: As above  Physical Exam:  height is 5\' 9"  (1.753 m) and weight is 230 lb (104.327 kg). His oral temperature is 98.2 F (36.8 C). His blood pressure is 121/79 and his pulse is 67. His respiration is 18.   Well-developed and well-nourished white gentleman. Head and neck exam shows no ocular or oral lesions. There are no palpable cervical or supraclavicular lymph nodes. Lungs are clear. Cardiac exam regular rate and rhythm with no murmurs, rubs or bruits. Axillary  exam shows no bilateral axillary. Abdomen is soft. She has good bowel sounds. There may be some slight tenderness over the left side. There is no splenomegaly. Liver edge is not palpable. Rectal exam shows no external hemorrhoids. There is no bleeding. He has no fissures. Extremities shows no clubbing, cyanosis or edema. Neurological exam is nonfocal. Skin exam no rashes, ecchymosis or petechia.  Lab Results  Component Value Date   WBC 7.0 10/16/2014   HGB 14.5 10/16/2014   HCT 41.9 10/16/2014   MCV 91 10/16/2014   PLT 193 10/16/2014     Chemistry      Component Value Date/Time   NA 138 10/16/2014 0906   NA 141 04/17/2014 0926   NA 141 12/16/2013 1208   K 4.4 10/16/2014 0906   K 4.3 04/17/2014 0926   K 4.0 12/16/2013 1208   CL 105 10/16/2014 0906   CL 104 12/16/2013 1208   CO2 20 10/16/2014 0906   CO2 24 04/17/2014 0926   CO2 25 12/16/2013 1208   BUN 17 10/16/2014 0906   BUN 18.9 04/17/2014 0926   BUN 15 12/16/2013 1208   CREATININE 1.23 10/16/2014 0906   CREATININE 1.2 04/17/2014 0926   CREATININE 1.3* 12/16/2013 1208      Component Value Date/Time   CALCIUM 9.6 10/16/2014 0906   CALCIUM 9.1 04/17/2014 0926   CALCIUM 9.2 12/16/2013 1208   ALKPHOS 54 10/16/2014 0906  ALKPHOS 66 04/17/2014 0926   ALKPHOS 51 12/16/2013 1208   AST 22 10/16/2014 0906   AST 25 04/17/2014 0926   AST 25 12/16/2013 1208   ALT 19 10/16/2014 0906   ALT 18 04/17/2014 0926   ALT 23 12/16/2013 1208   BILITOT 0.7 10/16/2014 0906   BILITOT 0.62 04/17/2014 0926   BILITOT 0.90 12/16/2013 1208         Impression and Plan: [name] is 64 year old gentleman with a history of a low-grade lymphoma. He underwent chemotherapy for this. He has done well. He got 6 cycles of R.-CVP. He completed this back in February of 2013.  He is doing quite well. He looks great. He's lost some weight [place] was a good sign.   He's had no problems with respect to lymphoma. There's been no complications from any  of the chemotherapy.  I think we can probably get him back now in 6 months.  [name], MD 7/28/20163:06 PM

## 2014-11-24 ENCOUNTER — Other Ambulatory Visit: Payer: Self-pay | Admitting: Internal Medicine

## 2014-11-25 NOTE — Telephone Encounter (Signed)
Please advise. Last OV 1/15

## 2014-11-25 NOTE — Telephone Encounter (Signed)
Last seen 03/2013 OV required before refill

## 2014-11-26 ENCOUNTER — Telehealth: Payer: Self-pay | Admitting: Internal Medicine

## 2014-11-26 ENCOUNTER — Encounter: Payer: Self-pay | Admitting: Internal Medicine

## 2014-11-27 NOTE — Telephone Encounter (Signed)
Pt has an appointment for tomorrow afternoon.  He is totally out of medication and was hoping to get it filled today to [facility]

## 2014-11-27 NOTE — Telephone Encounter (Signed)
Refill sent to local pharm for 30 days.

## 2014-11-28 ENCOUNTER — Encounter: Payer: Self-pay | Admitting: Internal Medicine

## 2014-11-28 ENCOUNTER — Ambulatory Visit (INDEPENDENT_AMBULATORY_CARE_PROVIDER_SITE_OTHER): Payer: Commercial Managed Care - HMO | Admitting: Internal Medicine

## 2014-11-28 VITALS — BP 128/70 | HR 68 | Temp 97.8°F | Ht 69.0 in | Wt 234.0 lb

## 2014-11-28 DIAGNOSIS — Z23 Encounter for immunization: Secondary | ICD-10-CM

## 2014-11-28 DIAGNOSIS — R739 Hyperglycemia, unspecified: Secondary | ICD-10-CM | POA: Diagnosis not present

## 2014-11-28 DIAGNOSIS — E039 Hypothyroidism, unspecified: Secondary | ICD-10-CM

## 2014-11-28 DIAGNOSIS — E785 Hyperlipidemia, unspecified: Secondary | ICD-10-CM | POA: Diagnosis not present

## 2014-11-28 MED ORDER — PRAVASTATIN SODIUM 40 MG PO TABS: 40.0000 mg | ORAL_TABLET | Freq: Every day | ORAL | Status: DC

## 2014-11-28 MED ORDER — LEVOTHYROXINE SODIUM 100 MCG PO TABS: ORAL_TABLET | ORAL | Status: DC

## 2014-11-28 NOTE — Progress Notes (Signed)
   Subjective:    Patient ID: [name] [name], male    DOB: November 21, 1950, 64 y.o.   MRN: [id number]  HPI The patient is here to assess status of active health conditions.  PMH, FH, & Social History reviewed & updated.No change in [place] as recorded.  He has been compliant with his medications without adverse effects. He has been out of pravastatin a few days. He has red meat 1-3 times per week and occasionally eats fried foods. He does eat salt on his foods.  Exercise is mainly walking 2-3 times per week for 20-30 minutes. He has no associated cardiopulmonary symptoms  Colonoscopy is up-to-date. He is seeing a specialist for some hemorrhoidal changes  He does have stiffness in the hands and shoulders and has intermittent trigger finger of the third digit of the right hand. This has been an issue for years.  He is does note some prolongation of bleeding in the context of the prophylactic aspirin.   Review of Systems  Chest pain, palpitations, tachycardia, exertional dyspnea, paroxysmal nocturnal dyspnea, claudication or edema are absent. No unexplained weight loss, abdominal pain, significant dyspepsia, dysphagia, melena, rectal bleeding, or persistently small caliber stools. Dysuria, pyuria, hematuria, frequency, nocturia or polyuria are denied. Change in hair, skin, nails denied. No bowel changes of constipation or diarrhea. No intolerance to heat or cold.     Objective:   Physical Exam  Pertinent or positive findings include: Pattern alopecia is present. He has a mustache. He has left lateral nystagmus. There is slight prominence to his stare but no lid lag. He has some clicking of the knees without effusion or significant crepitus.   General appearance :adequately nourished; in no distress.  Eyes: No conjunctival inflammation or scleral icterus is present.  Oral exam:  Lips and gums are healthy appearing.There is no oropharyngeal erythema or exudate noted. Dental hygiene is  good.  Heart:  Normal rate and regular rhythm. S1 and S2 normal without gallop, murmur, click, rub or other extra sounds    Lungs:Chest clear to auscultation; no wheezes, rhonchi,rales ,or rubs present.No increased work of breathing.   Abdomen: bowel sounds normal, soft and non-tender without masses, organomegaly or hernias noted.  No guarding or rebound.   Vascular : all pulses equal ; no bruits present.  Skin:Warm & dry.  Intact without suspicious lesions or rashes ; no tenting or jaundice   Lymphatic: No lymphadenopathy is noted about the head, neck, axilla.   Neuro: Strength, tone & DTRs normal.         Assessment & Plan:  See Current Assessment & Plan in Problem List under specific Diagnosis

## 2014-11-28 NOTE — Progress Notes (Signed)
Pre visit review using our clinic review tool, if applicable. No additional management support is needed unless otherwise documented below in the visit note. 

## 2014-11-28 NOTE — Patient Instructions (Addendum)
  Your next office appointment will be determined based upon review of your pending labs  In 8-9 weeks  Those written interpretation of the lab results and instructions will be transmitted to you by My Chart  Critical results will be called.   Followup as needed for any active or acute issue. Please report any significant change in your symptoms.  Minimal Blood Pressure Goal= AVERAGE < 140/90;  Ideal is an AVERAGE < 135/85. This AVERAGE should be calculated from @ least 5-7 BP readings taken @ different times of day on different days of week. You should not respond to isolated BP readings , but rather the AVERAGE for that week .Please bring your  blood pressure cuff to office visits to verify that it is reliable.It  can also be checked against the blood pressure device at the pharmacy. Finger or wrist cuffs are not dependable; an arm cuff is.

## 2014-12-01 NOTE — Assessment & Plan Note (Signed)
A1c

## 2014-12-01 NOTE — Assessment & Plan Note (Signed)
Blood pressure goals reviewed.  

## 2014-12-01 NOTE — Assessment & Plan Note (Signed)
TSH 

## 2014-12-01 NOTE — Assessment & Plan Note (Signed)
Lipids,TSH after being back on statin 8 weeks

## 2015-01-11 ENCOUNTER — Other Ambulatory Visit: Payer: Self-pay | Admitting: Hematology & Oncology

## 2015-01-29 ENCOUNTER — Other Ambulatory Visit (INDEPENDENT_AMBULATORY_CARE_PROVIDER_SITE_OTHER): Payer: Commercial Managed Care - HMO

## 2015-01-29 DIAGNOSIS — E039 Hypothyroidism, unspecified: Secondary | ICD-10-CM | POA: Diagnosis not present

## 2015-01-29 DIAGNOSIS — R739 Hyperglycemia, unspecified: Secondary | ICD-10-CM | POA: Diagnosis not present

## 2015-01-29 DIAGNOSIS — E785 Hyperlipidemia, unspecified: Secondary | ICD-10-CM

## 2015-01-29 LAB — LIPID PANEL
Cholesterol: 166 mg/dL (ref 0–200)
HDL: 38.4 mg/dL — AB (ref >39.00)
LDL Cholesterol: 98 mg/dL (ref 0–99)
NONHDL: 127.64
TRIGLYCERIDES: 147 mg/dL (ref 0.0–149.0)
Total CHOL/HDL Ratio: 4
VLDL: 29.4 mg/dL (ref 0.0–40.0)

## 2015-01-29 LAB — HEMOGLOBIN A1C: Hgb A1c MFr Bld: 5.4 % (ref 4.6–6.5)

## 2015-01-29 LAB — TSH: TSH: 3.75 u[IU]/mL (ref 0.35–4.50)

## 2015-01-30 ENCOUNTER — Encounter: Payer: Self-pay | Admitting: Internal Medicine

## 2015-03-12 ENCOUNTER — Other Ambulatory Visit: Payer: Self-pay | Admitting: Internal Medicine

## 2015-03-25 ENCOUNTER — Other Ambulatory Visit: Payer: Commercial Managed Care - HMO

## 2015-03-25 ENCOUNTER — Ambulatory Visit: Payer: Commercial Managed Care - HMO | Admitting: Hematology & Oncology

## 2015-03-31 ENCOUNTER — Other Ambulatory Visit: Payer: Self-pay | Admitting: Internal Medicine

## 2015-04-13 ENCOUNTER — Ambulatory Visit: Payer: Commercial Managed Care - HMO | Admitting: Hematology & Oncology

## 2015-04-13 ENCOUNTER — Other Ambulatory Visit: Payer: Commercial Managed Care - HMO

## 2015-04-20 ENCOUNTER — Other Ambulatory Visit (HOSPITAL_BASED_OUTPATIENT_CLINIC_OR_DEPARTMENT_OTHER): Payer: Commercial Managed Care - HMO

## 2015-04-20 ENCOUNTER — Ambulatory Visit (HOSPITAL_BASED_OUTPATIENT_CLINIC_OR_DEPARTMENT_OTHER): Payer: Commercial Managed Care - HMO | Admitting: Hematology & Oncology

## 2015-04-20 ENCOUNTER — Encounter: Payer: Self-pay | Admitting: Hematology & Oncology

## 2015-04-20 VITALS — BP 144/80 | HR 69 | Temp 98.0°F | Resp 16 | Ht 69.0 in | Wt 233.0 lb

## 2015-04-20 DIAGNOSIS — E785 Hyperlipidemia, unspecified: Secondary | ICD-10-CM

## 2015-04-20 DIAGNOSIS — E038 Other specified hypothyroidism: Secondary | ICD-10-CM

## 2015-04-20 DIAGNOSIS — R1012 Left upper quadrant pain: Secondary | ICD-10-CM | POA: Diagnosis not present

## 2015-04-20 DIAGNOSIS — Z8572 Personal history of non-Hodgkin lymphomas: Secondary | ICD-10-CM | POA: Diagnosis not present

## 2015-04-20 DIAGNOSIS — C8583 Other specified types of non-Hodgkin lymphoma, intra-abdominal lymph nodes: Secondary | ICD-10-CM

## 2015-04-20 DIAGNOSIS — Z79899 Other long term (current) drug therapy: Secondary | ICD-10-CM

## 2015-04-20 LAB — COMPREHENSIVE METABOLIC PANEL
ALT: 18 U/L (ref 0–55)
ANION GAP: 9 meq/L (ref 3–11)
AST: 19 U/L (ref 5–34)
Albumin: 4 g/dL (ref 3.5–5.0)
Alkaline Phosphatase: 57 U/L (ref 40–150)
BILIRUBIN TOTAL: 0.52 mg/dL (ref 0.20–1.20)
BUN: 15.9 mg/dL (ref 7.0–26.0)
CHLORIDE: 109 meq/L (ref 98–109)
CO2: 23 meq/L (ref 22–29)
Calcium: 9.1 mg/dL (ref 8.4–10.4)
Creatinine: 1.2 mg/dL (ref 0.7–1.3)
EGFR: 63 mL/min/{1.73_m2} — AB (ref >90)
GLUCOSE: 89 mg/dL (ref 70–140)
POTASSIUM: 4.2 meq/L (ref 3.5–5.1)
SODIUM: 140 meq/L (ref 136–145)
TOTAL PROTEIN: 7.3 g/dL (ref 6.4–8.3)

## 2015-04-20 LAB — CBC WITH DIFFERENTIAL (CANCER CENTER ONLY)
BASO#: 0.1 10*3/uL (ref 0.0–0.2)
BASO%: 0.8 % (ref 0.0–2.0)
EOS ABS: 0.1 10*3/uL (ref 0.0–0.5)
EOS%: 1.8 % (ref 0.0–7.0)
HCT: 41.4 % (ref 38.7–49.9)
HGB: 13.8 g/dL (ref 13.0–17.1)
LYMPH#: 2.6 10*3/uL (ref 0.9–3.3)
LYMPH%: 40.2 % (ref 14.0–48.0)
MCH: 30.1 pg (ref 28.0–33.4)
MCHC: 33.3 g/dL (ref 32.0–35.9)
MCV: 90 fL (ref 82–98)
MONO#: 0.5 10*3/uL (ref 0.1–0.9)
MONO%: 7.9 % (ref 0.0–13.0)
NEUT#: 3.2 10*3/uL (ref 1.5–6.5)
NEUT%: 49.3 % (ref 40.0–80.0)
PLATELETS: 160 10*3/uL (ref 145–400)
RBC: 4.59 10*6/uL (ref 4.20–5.70)
RDW: 12.7 % (ref 11.1–15.7)
WBC: 6.6 10*3/uL (ref 4.0–10.0)

## 2015-04-20 LAB — TSH: TSH: 4.659 m(IU)/L — ABNORMAL HIGH (ref 0.320–4.118)

## 2015-04-20 NOTE — Progress Notes (Signed)
Hematology and Oncology Follow Up Visit  [name] [name] [id number] [birth year] 65 y.o. 04/20/2015   Principle Diagnosis:  Marginal zone lymphoma-clinical remission  Current Therapy:    Observation     Interim History:  Mr.  [name] [name] is back for followup. He looks good. He feels pretty good. He's had no issues since we last saw him. He is on Synthroid.  He is quite busy working. He works at [employer] for [employer].  He did have a nice Thanksgiving and Christmas.  He's had no problems with nausea or vomiting. His been no change in bowel or bladder habits. He has an occasional pain in the left upper quadrant of the abdomen. He does not take anything for this area  He's had no cough or shortness of breath. He's had no rashes. He's had no fatigue.  Overall,  his performance status is ECOG 0.  He takes vitamin D and low-dose aspirin. . Medications:  Current outpatient prescriptions:  .  aspirin EC 81 MG tablet, Take 81 mg by mouth at bedtime., Disp: , Rfl:  .  cholecalciferol (VITAMIN D) 1000 UNITS tablet, Take 1,000 Units by mouth daily. , Disp: , Rfl:  .  levothyroxine (SYNTHROID, LEVOTHROID) 100 MCG tablet, TAKE ONE TABLET BY MOUTH ONE TIME DAILY BEFORE BREAKFAST, Disp: 30 tablet, Rfl: 2 .  levothyroxine (SYNTHROID, LEVOTHROID) 100 MCG tablet, TAKE ONE TABLET BY MOUTH ONE TIME DAILY BEFORE BREAKFAST, Disp: 30 tablet, Rfl: 3 .  pravastatin (PRAVACHOL) 40 MG tablet, Take 1 tablet (40 mg total) by mouth daily. --- Please establish with new PCP for further refills, Disp: 30 tablet, Rfl: 2 .  pravastatin (PRAVACHOL) 40 MG tablet, Take 1 tablet (40 mg total) by mouth daily. Must transition care w/vew provider for future refills, Disp: 90 tablet, Rfl: 0  Allergies: No Known Allergies  Past Medical History, Surgical history, Social history, and Family History were reviewed and updated.  Review of Systems: As above  Physical Exam:  height is 5\' 9"  (1.753 m) and weight is 233 lb  (105.688 kg). His oral temperature is 98 F (36.7 C). His blood pressure is 144/80 and his pulse is 69. His respiration is 16.   Well-developed and well-nourished white gentleman. Head and neck exam shows no ocular or oral lesions. There are no palpable cervical or supraclavicular lymph nodes. Lungs are clear. Cardiac exam regular rate and rhythm with no murmurs, rubs or bruits. Axillary exam shows no bilateral axillary. Abdomen is soft. She has good bowel sounds. There may be some slight tenderness over the left side. There is no splenomegaly. Liver edge is not palpable. Rectal exam shows no external hemorrhoids. There is no bleeding. He has no fissures. Extremities shows no clubbing, cyanosis or edema. Neurological exam is nonfocal. Skin exam no rashes, ecchymosis or petechia.  Lab Results  Component Value Date   WBC 6.6 04/20/2015   HGB 13.8 04/20/2015   HCT 41.4 04/20/2015   MCV 90 04/20/2015   PLT 160 04/20/2015     Chemistry      Component Value Date/Time   NA 138 10/16/2014 0906   NA 141 04/17/2014 0926   NA 141 12/16/2013 1208   K 4.4 10/16/2014 0906   K 4.3 04/17/2014 0926   K 4.0 12/16/2013 1208   CL 105 10/16/2014 0906   CL 104 12/16/2013 1208   CO2 20 10/16/2014 0906   CO2 24 04/17/2014 0926   CO2 25 12/16/2013 1208   BUN 17 10/16/2014 0906  BUN 18.9 04/17/2014 0926   BUN 15 12/16/2013 1208   CREATININE 1.23 10/16/2014 0906   CREATININE 1.2 04/17/2014 0926   CREATININE 1.3* 12/16/2013 1208      Component Value Date/Time   CALCIUM 9.6 10/16/2014 0906   CALCIUM 9.1 04/17/2014 0926   CALCIUM 9.2 12/16/2013 1208   ALKPHOS 54 10/16/2014 0906   ALKPHOS 66 04/17/2014 0926   ALKPHOS 51 12/16/2013 1208   AST 22 10/16/2014 0906   AST 25 04/17/2014 0926   AST 25 12/16/2013 1208   ALT 19 10/16/2014 0906   ALT 18 04/17/2014 0926   ALT 23 12/16/2013 1208   BILITOT 0.7 10/16/2014 0906   BILITOT 0.62 04/17/2014 0926   BILITOT 0.90 12/16/2013 1208          Impression and Plan: [name] [name] is 65 year old gentleman with a history of a low-grade lymphoma. He underwent chemotherapy for this. He has done well. He got 6 cycles of R.-CVP. He completed this back in February of 2013.  He is doing quite well. He looks great. He is trying to lose some weight. He is trying to exercise a little more.  He's had no problems with respect to lymphoma. There's been no complications from any of the chemotherapy.  We will get him back now in 6 months.  [name], MD 1/30/20179:57 AM

## 2015-05-14 ENCOUNTER — Other Ambulatory Visit: Payer: Self-pay | Admitting: Hematology & Oncology

## 2015-09-05 ENCOUNTER — Other Ambulatory Visit: Payer: Self-pay | Admitting: Hematology & Oncology

## 2015-09-21 ENCOUNTER — Other Ambulatory Visit: Payer: Self-pay | Admitting: *Deleted

## 2015-09-23 ENCOUNTER — Other Ambulatory Visit: Payer: Self-pay | Admitting: Hematology & Oncology

## 2015-09-30 ENCOUNTER — Other Ambulatory Visit: Payer: Self-pay | Admitting: Internal Medicine

## 2015-10-01 ENCOUNTER — Telehealth: Payer: Self-pay

## 2015-10-01 NOTE — Telephone Encounter (Signed)
Left message advising patient call back to schedule appt/get established with new pcp---former dr hopper patient=---ok for patient to be seen in oct/nov---let tamara know when appt is made so that rx refill for prevastatin can be called to patient's pharm

## 2015-10-19 ENCOUNTER — Encounter: Payer: Self-pay | Admitting: Hematology & Oncology

## 2015-10-19 ENCOUNTER — Ambulatory Visit (HOSPITAL_BASED_OUTPATIENT_CLINIC_OR_DEPARTMENT_OTHER): Payer: Commercial Managed Care - HMO | Admitting: Hematology & Oncology

## 2015-10-19 ENCOUNTER — Other Ambulatory Visit (HOSPITAL_BASED_OUTPATIENT_CLINIC_OR_DEPARTMENT_OTHER): Payer: Commercial Managed Care - HMO

## 2015-10-19 VITALS — BP 144/87 | HR 70 | Temp 97.7°F | Resp 16 | Ht 69.0 in | Wt 238.1 lb

## 2015-10-19 DIAGNOSIS — R1012 Left upper quadrant pain: Secondary | ICD-10-CM

## 2015-10-19 DIAGNOSIS — Z8572 Personal history of non-Hodgkin lymphomas: Secondary | ICD-10-CM | POA: Diagnosis not present

## 2015-10-19 DIAGNOSIS — E785 Hyperlipidemia, unspecified: Secondary | ICD-10-CM

## 2015-10-19 DIAGNOSIS — R739 Hyperglycemia, unspecified: Secondary | ICD-10-CM

## 2015-10-19 DIAGNOSIS — C8583 Other specified types of non-Hodgkin lymphoma, intra-abdominal lymph nodes: Secondary | ICD-10-CM

## 2015-10-19 DIAGNOSIS — E559 Vitamin D deficiency, unspecified: Secondary | ICD-10-CM

## 2015-10-19 DIAGNOSIS — E032 Hypothyroidism due to medicaments and other exogenous substances: Secondary | ICD-10-CM

## 2015-10-19 LAB — CBC WITH DIFFERENTIAL (CANCER CENTER ONLY)
BASO#: 0.1 10*3/uL (ref 0.0–0.2)
BASO%: 0.9 % (ref 0.0–2.0)
EOS ABS: 0.2 10*3/uL (ref 0.0–0.5)
EOS%: 3 % (ref 0.0–7.0)
HEMATOCRIT: 40.9 % (ref 38.7–49.9)
HEMOGLOBIN: 14 g/dL (ref 13.0–17.1)
LYMPH#: 2.9 10*3/uL (ref 0.9–3.3)
LYMPH%: 45.4 % (ref 14.0–48.0)
MCH: 31.2 pg (ref 28.0–33.4)
MCHC: 34.2 g/dL (ref 32.0–35.9)
MCV: 91 fL (ref 82–98)
MONO#: 0.6 10*3/uL (ref 0.1–0.9)
MONO%: 8.8 % (ref 0.0–13.0)
NEUT%: 41.9 % (ref 40.0–80.0)
NEUTROS ABS: 2.7 10*3/uL (ref 1.5–6.5)
Platelets: 173 10*3/uL (ref 145–400)
RBC: 4.49 10*6/uL (ref 4.20–5.70)
RDW: 12.6 % (ref 11.1–15.7)
WBC: 6.4 10*3/uL (ref 4.0–10.0)

## 2015-10-19 LAB — COMPREHENSIVE METABOLIC PANEL
ALBUMIN: 4 g/dL (ref 3.5–5.0)
ALK PHOS: 68 U/L (ref 40–150)
ALT: 19 U/L (ref 0–55)
AST: 21 U/L (ref 5–34)
Anion Gap: 9 mEq/L (ref 3–11)
BUN: 21.6 mg/dL (ref 7.0–26.0)
CALCIUM: 9.4 mg/dL (ref 8.4–10.4)
CO2: 25 mEq/L (ref 22–29)
Chloride: 106 mEq/L (ref 98–109)
Creatinine: 1.3 mg/dL (ref 0.7–1.3)
EGFR: 59 mL/min/{1.73_m2} — AB (ref >90)
Glucose: 96 mg/dl (ref 70–140)
POTASSIUM: 4.1 meq/L (ref 3.5–5.1)
Sodium: 140 mEq/L (ref 136–145)
Total Bilirubin: 0.54 mg/dL (ref 0.20–1.20)
Total Protein: 7.5 g/dL (ref 6.4–8.3)

## 2015-10-19 LAB — LACTATE DEHYDROGENASE: LDH: 190 U/L (ref 125–245)

## 2015-10-19 NOTE — Progress Notes (Signed)
Hematology and Oncology Follow Up Visit  [name] [name] [id number] 08-16-1950 65 y.o. 10/19/2015   Principle Diagnosis:  Marginal zone lymphoma-clinical remission  Current Therapy:    Observation     Interim History:  Mr.  [name] [name] is back for followup. He looks good. He feels pretty good. He's had no issues since we last saw him. He is on Synthroid.  He is quite busy working. He works at [employer] for [employer].  He was up at [facility] a few weeks ago. This was a birthday present from his son. He had a great time of there. He rode the roller coasters. He really enjoyed himself.  He's had no problems with nausea or vomiting. His been no change in bowel or bladder habits. He has an occasional pain in the left upper quadrant of the abdomen. He does not take anything for this area  He's had no cough or shortness of breath. He's had no rashes. He's had no fatigue.  Overall,  his performance status is ECOG 0.  He takes vitamin D and low-dose aspirin. . Medications:  Current Outpatient Prescriptions:  .  aspirin EC 81 MG tablet, Take 81 mg by mouth at bedtime., Disp: , Rfl:  .  cholecalciferol (VITAMIN D) 1000 UNITS tablet, Take 1,000 Units by mouth daily. , Disp: , Rfl:  .  levothyroxine (SYNTHROID, LEVOTHROID) 100 MCG tablet, TAKE ONE TABLET BY MOUTH ONE TIME DAILY BEFORE BREAKFAST, Disp: 30 tablet, Rfl: 2 .  levothyroxine (SYNTHROID, LEVOTHROID) 100 MCG tablet, TAKE ONE TABLET BY MOUTH ONE TIME DAILY BEFORE BREAKFAST, Disp: 30 tablet, Rfl: 3 .  pravastatin (PRAVACHOL) 40 MG tablet, Take 1 tablet (40 mg total) by mouth daily. --- Please establish with new PCP for further refills, Disp: 30 tablet, Rfl: 2 .  pravastatin (PRAVACHOL) 40 MG tablet, TAKE 1 TABLET (40 MG TOTAL) BY MOUTH DAILY, Disp: 90 tablet, Rfl: 0  Allergies: No Known Allergies  Past Medical History, Surgical history, Social history, and Family History were reviewed and updated.  Review of Systems: As  above  Physical Exam:  height is 5\' 9"  (1.753 m) and weight is 238 lb 1.9 oz (108 kg). His oral temperature is 97.7 F (36.5 C). His blood pressure is 144/87 (abnormal) and his pulse is 70. His respiration is 16.   Well-developed and well-nourished white gentleman. Head and neck exam shows no ocular or oral lesions. There are no palpable cervical or supraclavicular lymph nodes. Lungs are clear. Cardiac exam regular rate and rhythm with no murmurs, rubs or bruits. Axillary exam shows no bilateral axillary. Abdomen is soft. She has good bowel sounds. There may be some slight tenderness over the left side. There is no splenomegaly. Liver edge is not palpable. Rectal exam shows no external hemorrhoids. There is no bleeding. He has no fissures. Extremities shows no clubbing, cyanosis or edema. Neurological exam is nonfocal. Skin exam no rashes, ecchymosis or petechia.  Lab Results  Component Value Date   WBC 6.4 10/19/2015   HGB 14.0 10/19/2015   HCT 40.9 10/19/2015   MCV 91 10/19/2015   PLT 173 10/19/2015     Chemistry      Component Value Date/Time   NA 140 04/20/2015 0922   K 4.2 04/20/2015 0922   CL 105 10/16/2014 0906   CL 104 12/16/2013 1208   CO2 23 04/20/2015 0922   BUN 15.9 04/20/2015 0922   CREATININE 1.2 04/20/2015 0922      Component Value Date/Time   CALCIUM  9.1 04/20/2015 0922   ALKPHOS 57 04/20/2015 0922   AST 19 04/20/2015 0922   ALT 18 04/20/2015 0922   BILITOT 0.52 04/20/2015 [id number]         Impression and Plan: [name] [name] is 65 year old gentleman with a history of a low-grade lymphoma. He underwent chemotherapy for this. He has done well. He got 6 cycles of R.-CVP. He completed this back in February of 2013.  He is doing quite well. He looks great. He is trying to lose some weight. He is trying to exercise a little more.  He's had no problems with respect to lymphoma. There's been no complications from any of the chemotherapy.  We will get him back now in 6  months.  [name], MD 7/31/20179:52 AM

## 2015-10-20 ENCOUNTER — Encounter: Payer: Self-pay | Admitting: *Deleted

## 2015-10-20 LAB — LIPID PANEL
CHOLESTEROL TOTAL: 161 mg/dL (ref 100–199)
Chol/HDL Ratio: 4 ratio units (ref 0.0–5.0)
HDL: 40 mg/dL (ref >39)
LDL CALC: 86 mg/dL (ref 0–99)
Triglycerides: 175 mg/dL — ABNORMAL HIGH (ref 0–149)
VLDL CHOLESTEROL CAL: 35 mg/dL (ref 5–40)

## 2015-11-16 ENCOUNTER — Other Ambulatory Visit: Payer: Self-pay | Admitting: Nurse Practitioner

## 2015-11-16 MED ORDER — LEVOTHYROXINE SODIUM 100 MCG PO TABS: ORAL_TABLET | ORAL | 6 refills | Status: DC

## 2015-12-10 ENCOUNTER — Encounter: Payer: Self-pay | Admitting: Hematology & Oncology

## 2015-12-11 ENCOUNTER — Encounter: Payer: Self-pay | Admitting: *Deleted

## 2015-12-15 ENCOUNTER — Telehealth: Payer: Self-pay | Admitting: General Practice

## 2015-12-15 NOTE — Telephone Encounter (Signed)
Use to be patient of Dr. Linna Darner.  Patient states wife just established with Dr. Quay Burow.  Patient is requesting to establish with Dr. Quay Burow as well.  Please advise.

## 2015-12-15 NOTE — Telephone Encounter (Signed)
ok 

## 2015-12-16 ENCOUNTER — Telehealth: Payer: Self-pay | Admitting: Internal Medicine

## 2015-12-16 MED ORDER — PRAVASTATIN SODIUM 40 MG PO TABS: 40.0000 mg | ORAL_TABLET | Freq: Every day | ORAL | 0 refills | Status: DC

## 2015-12-16 NOTE — Telephone Encounter (Signed)
Patient is scheduled to transfer from [place] to [place] on 12/26.  Patient will be out of pravastatin within the next week and a half.  Patient is requesting refill to be sent to [facility] [address] on [address].  Patient states he has had recent lab work in regard to this medication in July/ August at [facility].

## 2015-12-16 NOTE — Telephone Encounter (Signed)
Left patient vm to call back to schedule appt  °

## 2015-12-16 NOTE — Telephone Encounter (Signed)
RX sent to [facility]

## 2016-01-12 ENCOUNTER — Other Ambulatory Visit: Payer: Self-pay | Admitting: Emergency Medicine

## 2016-01-12 ENCOUNTER — Other Ambulatory Visit: Payer: Self-pay | Admitting: Internal Medicine

## 2016-01-12 MED ORDER — PRAVASTATIN SODIUM 40 MG PO TABS: 40.0000 mg | ORAL_TABLET | Freq: Every day | ORAL | 5 refills | Status: DC

## 2016-01-28 ENCOUNTER — Ambulatory Visit (INDEPENDENT_AMBULATORY_CARE_PROVIDER_SITE_OTHER): Payer: Commercial Managed Care - HMO | Admitting: Nurse Practitioner

## 2016-01-28 ENCOUNTER — Encounter: Payer: Self-pay | Admitting: Nurse Practitioner

## 2016-01-28 VITALS — BP 128/78 | HR 78 | Temp 97.7°F | Ht 71.0 in | Wt 225.0 lb

## 2016-01-28 DIAGNOSIS — R509 Fever, unspecified: Secondary | ICD-10-CM

## 2016-01-28 DIAGNOSIS — J209 Acute bronchitis, unspecified: Secondary | ICD-10-CM | POA: Diagnosis not present

## 2016-01-28 MED ORDER — GUAIFENESIN ER 600 MG PO TB12: 600.0000 mg | ORAL_TABLET | Freq: Two times a day (BID) | ORAL | 0 refills | Status: DC | PRN

## 2016-01-28 MED ORDER — PROMETHAZINE-DM 6.25-15 MG/5ML PO SYRP: 5.0000 mL | ORAL_SOLUTION | Freq: Three times a day (TID) | ORAL | 0 refills | Status: DC | PRN

## 2016-01-28 MED ORDER — LEVOFLOXACIN 500 MG PO TABS: 500.0000 mg | ORAL_TABLET | Freq: Every day | ORAL | 0 refills | Status: DC

## 2016-01-28 MED ORDER — ALBUTEROL SULFATE HFA 108 (90 BASE) MCG/ACT IN AERS
2.0000 | INHALATION_SPRAY | Freq: Four times a day (QID) | RESPIRATORY_TRACT | 0 refills | Status: DC | PRN
Start: 2016-01-28 — End: 2016-03-15

## 2016-01-28 NOTE — Progress Notes (Signed)
Subjective:  Patient ID: [name] [name], male    DOB: 25-Mar-1950  Age: 65 y.o. MRN: [id number]  CC: Cough (Pt stated congested, fever, body ache, fatique for about 1 week)   Cough  This is a new problem. The current episode started in the past 7 days. The problem has been gradually worsening. The cough is productive of purulent sputum. Associated symptoms include chest pain, chills, a fever, nasal congestion, postnasal drip, a sore throat, shortness of breath and wheezing. Pertinent negatives include no ear congestion, ear pain, headaches, heartburn, myalgias, rash, rhinorrhea or sweats. The symptoms are aggravated by lying down. He has tried OTC cough suppressant for the symptoms. The treatment provided no relief. There is no history of environmental allergies.    Outpatient Medications Prior to Visit  Medication Sig Dispense Refill  . aspirin EC 81 MG tablet Take 81 mg by mouth at bedtime.    . cholecalciferol (VITAMIN D) 1000 UNITS tablet Take 1,000 Units by mouth daily.     Marland Kitchen levothyroxine (SYNTHROID, LEVOTHROID) 100 MCG tablet TAKE ONE TABLET BY MOUTH ONE TIME DAILY BEFORE BREAKFAST 90 tablet 6  . pravastatin (PRAVACHOL) 40 MG tablet Take 1 tablet (40 mg total) by mouth daily. 30 tablet 5   No facility-administered medications prior to visit.     ROS See HPI  Objective:  BP 128/78 (BP Location: Left Arm, Patient Position: Sitting, Cuff Size: Normal)   Pulse 78   Temp 97.7 F (36.5 C)   Ht 5\' 11"  (1.803 m)   Wt 225 lb (102.1 kg)   SpO2 94%   BMI 31.38 kg/m   BP Readings from Last 3 Encounters:  01/28/16 128/78  10/19/15 (!) 144/87  04/20/15 (!) 144/80    Wt Readings from Last 3 Encounters:  01/28/16 225 lb (102.1 kg)  10/19/15 238 lb 1.9 oz (108 kg)  04/20/15 233 lb (105.7 kg)    Physical Exam  Constitutional: He is oriented to person, place, and time. No distress.  HENT:  Right Ear: Tympanic membrane, external ear and ear canal normal.  Left Ear: Tympanic  membrane and ear canal normal.  Nose: Mucosal edema present. No rhinorrhea. Right sinus exhibits no maxillary sinus tenderness and no frontal sinus tenderness. Left sinus exhibits no maxillary sinus tenderness and no frontal sinus tenderness.  Mouth/Throat: Uvula is midline. Posterior oropharyngeal erythema present. No oropharyngeal exudate.  Eyes: No scleral icterus.  Neck: Normal range of motion. Neck supple.  Cardiovascular: Normal rate and regular rhythm.   Pulmonary/Chest: Effort normal and breath sounds normal.  Musculoskeletal: He exhibits no edema.  Lymphadenopathy:    He has no cervical adenopathy.  Neurological: He is alert and oriented to person, place, and time.  Vitals reviewed.   Lab Results  Component Value Date   WBC 6.4 10/19/2015   HGB 14.0 10/19/2015   HCT 40.9 10/19/2015   PLT 173 10/19/2015   GLUCOSE 96 10/19/2015   CHOL 161 10/19/2015   TRIG 175 (H) 10/19/2015   HDL 40 10/19/2015   LDLCALC 86 10/19/2015   ALT 19 10/19/2015   AST 21 10/19/2015   NA 140 10/19/2015   K 4.1 10/19/2015   CL 105 10/16/2014   CREATININE 1.3 10/19/2015   BUN 21.6 10/19/2015   CO2 25 10/19/2015   TSH 4.659 (H) 04/20/2015   PSA 0.92 08/31/2010   INR 0.97 06/08/2012   HGBA1C 5.4 01/29/2015    Ct Chest W Contrast  Result Date: 12/23/2013 CLINICAL DATA:  History of  treated lymphoma. New clinical symptoms with night sweats. EXAM: CT CHEST, ABDOMEN, AND PELVIS WITH CONTRAST TECHNIQUE: Multidetector CT imaging of the chest, abdomen and pelvis was performed following the standard protocol during bolus administration of intravenous contrast. CONTRAST:  176mL OMNIPAQUE IOHEXOL 300 MG/ML  SOLN COMPARISON:  04/18/2011 FINDINGS: CT CHEST FINDINGS Chest wall: No supraclavicular or axillary lymphadenopathy. A few small scattered benign-appearing lymph nodes are noted. The thyroid gland is grossly normal. The bony thorax is intact. No destructive bone lesions or spinal canal compromise.  Mediastinum: Small scattered mediastinal and hilar lymph nodes are stable. No mass or adenopathy. The aorta is normal in caliber. No dissection. The branch vessels are patent. Minimal coronary artery calcifications. The heart is normal in size. No pericardial effusion. A small hiatal hernia is noted. Lungs: No acute pulmonary findings or worrisome pulmonary lesions. No pleural effusion. CT ABDOMEN AND PELVIS FINDINGS Hepatobiliary: Normal. Pancreas: Normal. Spleen: Normal. Adrenals/Urinary Tract: Normal. Stomach/Bowel: The stomach, duodenum, small bowel and colon are unremarkable. No inflammatory changes, mass lesions or obstructive findings. The terminal ileum is normal. The appendix is normal. Vascular/Lymphatic: The aorta is normal in caliber. The branch vessels are patent. No mesenteric or retroperitoneal mass or lymphadenopathy Pelvis: The bladder, prostate gland and seminal vesicles are unremarkable. No pelvic mass, adenopathy or free pelvic fluid collections. No inguinal mass or adenopathy. Musculoskeletal: No significant bony findings. Degenerative facet disease is noted in the lower lumbar spine. IMPRESSION: Unremarkable CT examination of the chest, abdomen and pelvis. No acute inflammatory changes, mass lesions or lymphadenopathy. Electronically Signed   By: Kalman Jewels M.D.   On: 12/23/2013 08:58   Ct Abdomen Pelvis W Contrast  Result Date: 12/23/2013 CLINICAL DATA:  History of treated lymphoma. New clinical symptoms with night sweats. EXAM: CT CHEST, ABDOMEN, AND PELVIS WITH CONTRAST TECHNIQUE: Multidetector CT imaging of the chest, abdomen and pelvis was performed following the standard protocol during bolus administration of intravenous contrast. CONTRAST:  179mL OMNIPAQUE IOHEXOL 300 MG/ML  SOLN COMPARISON:  04/18/2011 FINDINGS: CT CHEST FINDINGS Chest wall: No supraclavicular or axillary lymphadenopathy. A few small scattered benign-appearing lymph nodes are noted. The thyroid gland is  grossly normal. The bony thorax is intact. No destructive bone lesions or spinal canal compromise. Mediastinum: Small scattered mediastinal and hilar lymph nodes are stable. No mass or adenopathy. The aorta is normal in caliber. No dissection. The branch vessels are patent. Minimal coronary artery calcifications. The heart is normal in size. No pericardial effusion. A small hiatal hernia is noted. Lungs: No acute pulmonary findings or worrisome pulmonary lesions. No pleural effusion. CT ABDOMEN AND PELVIS FINDINGS Hepatobiliary: Normal. Pancreas: Normal. Spleen: Normal. Adrenals/Urinary Tract: Normal. Stomach/Bowel: The stomach, duodenum, small bowel and colon are unremarkable. No inflammatory changes, mass lesions or obstructive findings. The terminal ileum is normal. The appendix is normal. Vascular/Lymphatic: The aorta is normal in caliber. The branch vessels are patent. No mesenteric or retroperitoneal mass or lymphadenopathy Pelvis: The bladder, prostate gland and seminal vesicles are unremarkable. No pelvic mass, adenopathy or free pelvic fluid collections. No inguinal mass or adenopathy. Musculoskeletal: No significant bony findings. Degenerative facet disease is noted in the lower lumbar spine. IMPRESSION: Unremarkable CT examination of the chest, abdomen and pelvis. No acute inflammatory changes, mass lesions or lymphadenopathy. Electronically Signed   By: Kalman Jewels M.D.   On: 12/23/2013 08:58    Assessment & Plan:   [name] [name] was seen today for cough.  Diagnoses and all orders for this visit:  Acute bronchitis, unspecified  organism -     levofloxacin (LEVAQUIN) 500 MG tablet; Take 1 tablet (500 mg total) by mouth daily. -     albuterol (PROVENTIL HFA;VENTOLIN HFA) 108 (90 Base) MCG/ACT inhaler; Inhale 2 puffs into the lungs every 6 (six) hours as needed for wheezing or shortness of breath. -     guaiFENesin (MUCINEX) 600 MG 12 hr tablet; Take 1 tablet (600 mg total) by mouth 2 (two) times  daily as needed for cough or to loosen phlegm. -     promethazine-dextromethorphan (PROMETHAZINE-DM) 6.25-15 MG/5ML syrup; Take 5 mLs by mouth 3 (three) times daily as needed for cough.  Fever and chills -     levofloxacin (LEVAQUIN) 500 MG tablet; Take 1 tablet (500 mg total) by mouth daily. -     albuterol (PROVENTIL HFA;VENTOLIN HFA) 108 (90 Base) MCG/ACT inhaler; Inhale 2 puffs into the lungs every 6 (six) hours as needed for wheezing or shortness of breath. -     guaiFENesin (MUCINEX) 600 MG 12 hr tablet; Take 1 tablet (600 mg total) by mouth 2 (two) times daily as needed for cough or to loosen phlegm. -     promethazine-dextromethorphan (PROMETHAZINE-DM) 6.25-15 MG/5ML syrup; Take 5 mLs by mouth 3 (three) times daily as needed for cough.   I am having [name] start on levofloxacin, albuterol, guaiFENesin, and promethazine-dextromethorphan. I am also having him maintain his cholecalciferol, aspirin EC, levothyroxine, and pravastatin.  Meds ordered this encounter  Medications  . levofloxacin (LEVAQUIN) 500 MG tablet    Sig: Take 1 tablet (500 mg total) by mouth daily.    Dispense:  5 tablet    Refill:  0    Order Specific Question:   Supervising Provider    Answer:   Cassandria Anger [[id number]]  . albuterol (PROVENTIL HFA;VENTOLIN HFA) 108 (90 Base) MCG/ACT inhaler    Sig: Inhale 2 puffs into the lungs every 6 (six) hours as needed for wheezing or shortness of breath.    Dispense:  1 Inhaler    Refill:  0    Order Specific Question:   Supervising Provider    Answer:   Cassandria Anger [[id number]]  . guaiFENesin (MUCINEX) 600 MG 12 hr tablet    Sig: Take 1 tablet (600 mg total) by mouth 2 (two) times daily as needed for cough or to loosen phlegm.    Dispense:  14 tablet    Refill:  0    Order Specific Question:   Supervising Provider    Answer:   Cassandria Anger [[id number]]  . promethazine-dextromethorphan (PROMETHAZINE-DM) 6.25-15 MG/5ML syrup    Sig: Take 5 mLs by mouth 3  (three) times daily as needed for cough.    Dispense:  240 mL    Refill:  0    Order Specific Question:   Supervising Provider    Answer:   Cassandria Anger [[id number]]    Follow-up: Return if symptoms worsen or fail to improve.  Wilfred Lacy, NP

## 2016-01-28 NOTE — Patient Instructions (Signed)
Return to office if no improvement in 1week.

## 2016-01-28 NOTE — Progress Notes (Signed)
Pre visit review using our clinic review tool, if applicable. No additional management support is needed unless otherwise documented below in the visit note. 

## 2016-03-08 ENCOUNTER — Encounter: Payer: Self-pay | Admitting: Internal Medicine

## 2016-03-14 NOTE — Progress Notes (Signed)
Subjective:    Patient ID: [name] [name], male    DOB: Sep 11, 1950, 65 y.o.   MRN: [id number]  HPI He is here for a physical exam.   Hypothyroidism:  He is taking his medication daily.  He denies any recent changes in weight that are unexplained.  He has been a little more tired recently, but thinks it is related to his recent cold.   Hyperlipidemia: He is taking his medication daily. He is compliant with a low fat/cholesterol diet. He is very active, but not exercising regularly.    Had a recent cold.  He still has a dry intermittent cough.  It has improved.  He feels more tired.    Medications and allergies reviewed with patient and updated if appropriate.  Patient Active Problem List   Diagnosis Date Noted  . Elevated glucose 04/08/2013  . Tubular adenoma of colon 04/08/2013  . 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 ELEVATED BLOOD PRESSURE WITHOUT DIAGNOSIS OF HYPERTENSION 08/21/2009  . Hyperlipidemia 07/04/2007  . Hypothyroidism 06/19/2007    Current Outpatient Prescriptions on File Prior to Visit  Medication Sig Dispense Refill  . aspirin EC 81 MG tablet Take 81 mg by mouth at bedtime.    . cholecalciferol (VITAMIN D) 1000 UNITS tablet Take 1,000 Units by mouth daily.     Marland Kitchen levothyroxine (SYNTHROID, LEVOTHROID) 100 MCG tablet TAKE ONE TABLET BY MOUTH ONE TIME DAILY BEFORE BREAKFAST 90 tablet 6  . pravastatin (PRAVACHOL) 40 MG tablet Take 1 tablet (40 mg total) by mouth daily. 30 tablet 5   No current facility-administered medications on file prior to visit.     Past Medical History:  Diagnosis Date  . Arthritis   . Hemorrhoids 2014  . Hyperlipidemia   . 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 Thyroid disease 2007   hypothyroidism    Past Surgical History:  Procedure Laterality Date  . COLONOSCOPY  2008   Dr Fuller Plan  . COLONOSCOPY W/ POLYPECTOMY  2014   Dr Fuller Plan  . [facility] PLACEMENT  Dec  2012   REMOVED spring 2014  . TONSILLECTOMY      Social History   Social History  . Marital status: Married    Spouse name: N/A  . Number of children: 2  . Years of education: N/A   Occupational History  . Retired     Airline pilot   Social History Main Topics  . Smoking status: Never Smoker  . Smokeless tobacco: Never Used     Comment: NEVER USED TOBACCO  . Alcohol use 0.0 oz/week     Comment:  1-2 wine / week  . Drug use: No  . Sexual activity: Not Asked   Other Topics Concern  . None   Social History Narrative   Exercise:  No regular exercise, active, yardwork    Family History  Problem Relation Age of Onset  . Alzheimer's disease Mother   . Heart attack Father 68  . Diabetes Brother   . Hypertension Brother      X 3  . Heart disease Paternal Grandfather     in 67s  . Coronary artery disease Brother     CBAG  . Cancer Neg Hx   . Stroke Neg Hx     Review of Systems  Constitutional: Positive for fatigue (? recent URI).  HENT: Negative for congestion, ear pain, hearing loss, sinus pressure and sore throat.   Eyes: Positive for visual  disturbance (changes in close vision).  Respiratory: Positive for cough (residual from recent URI, dry). Negative for shortness of breath and wheezing.   Cardiovascular: Positive for leg swelling (mild, a couple of times - being on feet for long time). Negative for chest pain and palpitations.  Gastrointestinal: Positive for anal bleeding (occ, hemorrhoidal). Negative for abdominal pain, blood in stool, constipation, diarrhea and nausea.       No gerd  Genitourinary: Negative for difficulty urinating, dysuria and hematuria.  Musculoskeletal: Positive for arthralgias (right foot arthritis, right fingers stiffness).  Skin: Negative for color change and rash.  Neurological: Negative for light-headedness and headaches.  Psychiatric/Behavioral: Negative for dysphoric mood. The patient is not nervous/anxious.        Objective:    Vitals:   03/15/16 0845  BP: 130/88  Pulse: 78  Resp: 16  Temp: 97.6 F (36.4 C)   Filed Weights   03/15/16 0845  Weight: 234 lb (106.1 kg)   Body mass index is 32.64 kg/m.   Physical Exam Constitutional: He appears well-developed and well-nourished. No distress.  HENT:  Head: Normocephalic and atraumatic.  Right Ear: External ear normal.  Left Ear: External ear normal.  Mouth/Throat: Oropharynx is clear and moist.  Normal ear canals and TM b/l  Eyes: Conjunctivae and EOM are normal.  Neck: Neck supple. No tracheal deviation present. No thyromegaly present.  No carotid bruit  Cardiovascular: Normal rate, regular rhythm, normal heart sounds and intact distal pulses.   No murmur heard. Pulmonary/Chest: Effort normal and breath sounds normal. No respiratory distress. He has no wheezes. He has no rales.  Abdominal: Soft. Bowel sounds are normal. He exhibits no distension. There is no tenderness.  Genitourinary:  Prostate feels normal in size, without nodules Musculoskeletal: He exhibits no edema.  Lymphadenopathy:   He has no cervical adenopathy.  Skin: Skin is warm and dry. He is not diaphoretic. normal appearing moles Psychiatric: He has a normal mood and affect. His behavior is normal.         Assessment & Plan:   Physical exam: Screening blood work  ordered Immunizations  Discussed shingles vaccine Colonoscopy   Up to date  Eye exams  - due - will schedule Exercise - very active, encouraged regular walking Weight -  Advised weight loss  Skin - mole on neck - no change, monitor skin - consider skin check by derm Substance abuse   - none  See Problem List for Assessment and Plan of chronic medical problems.   F/u annually

## 2016-03-14 NOTE — Patient Instructions (Addendum)
Test(s) ordered today. Your results will be released to [place] (or called to you) after review, usually within 72hours after test completion. If any changes need to be made, you will be notified at that same time.  All other Health Maintenance issues reviewed.   All recommended immunizations and age-appropriate screenings are up-to-date or discussed.  No immunizations administered today.   Medications reviewed and updated.  No changes recommended at this time.   Please followup in one year    Health Maintenance, Male A healthy lifestyle and preventative care can promote health and wellness.  Maintain regular health, dental, and eye exams.  Eat a healthy diet. Foods like vegetables, fruits, whole grains, low-fat dairy products, and lean protein foods contain the nutrients you need and are low in calories. Decrease your intake of foods high in solid fats, added sugars, and salt. Get information about a proper diet from your health care provider, if necessary.  Regular physical exercise is one of the most important things you can do for your health. Most adults should get at least 150 minutes of moderate-intensity exercise (any activity that increases your heart rate and causes you to sweat) each week. In addition, most adults need muscle-strengthening exercises on 2 or more days a week.   Maintain a healthy weight. The body mass index (BMI) is a screening tool to identify possible weight problems. It provides an estimate of body fat based on height and weight. Your health care provider can find your BMI and can help you achieve or maintain a healthy weight. For males 20 years and older:  A BMI below 18.5 is considered underweight.  A BMI of 18.5 to 24.9 is normal.  A BMI of 25 to 29.9 is considered overweight.  A BMI of 30 and above is considered obese.  Maintain normal blood lipids and cholesterol by exercising and minimizing your intake of saturated fat. Eat a balanced diet with  plenty of fruits and vegetables. Blood tests for lipids and cholesterol should begin at age 80 and be repeated every 5 years. If your lipid or cholesterol levels are high, you are over age 63, or you are at high risk for heart disease, you may need your cholesterol levels checked more frequently.Ongoing high lipid and cholesterol levels should be treated with medicines if diet and exercise are not working.  If you smoke, find out from your health care provider how to quit. If you do not use tobacco, do not start.  Lung cancer screening is recommended for adults aged 69-80 years who are at high risk for developing lung cancer because of a history of smoking. A yearly low-dose CT scan of the lungs is recommended for people who have at least a 30-pack-year history of smoking and are current smokers or have quit within the past 15 years. A pack year of smoking is smoking an average of 1 pack of cigarettes a day for 1 year (for example, a 30-pack-year history of smoking could mean smoking 1 pack a day for 30 years or 2 packs a day for 15 years). Yearly screening should continue until the smoker has stopped smoking for at least 15 years. Yearly screening should be stopped for people who develop a health problem that would prevent them from having lung cancer treatment.  If you choose to drink alcohol, do not have more than 2 drinks per day. One drink is considered to be 12 oz (360 mL) of beer, 5 oz (150 mL) of wine, or 1.5 oz (  45 mL) of liquor.  Avoid the use of street drugs. Do not share needles with anyone. Ask for help if you need support or instructions about stopping the use of drugs.  High blood pressure causes heart disease and increases the risk of stroke. High blood pressure is more likely to develop in:  People who have blood pressure in the end of the normal range (100-139/85-89 mm Hg).  People who are overweight or obese.  People who are African American.  If you are 61-48 years of age, have  your blood pressure checked every 3-5 years. If you are 40 years of age or older, have your blood pressure checked every year. You should have your blood pressure measured twice-once when you are at a hospital or clinic, and once when you are not at a hospital or clinic. Record the average of the two measurements. To check your blood pressure when you are not at a hospital or clinic, you can use:  An automated blood pressure machine at a pharmacy.  A home blood pressure monitor.  If you are 28-88 years old, ask your health care provider if you should take aspirin to prevent heart disease.  Diabetes screening involves taking a blood sample to check your fasting blood sugar level. This should be done once every 3 years after age 70 if you are at a normal weight and without risk factors for diabetes. Testing should be considered at a younger age or be carried out more frequently if you are overweight and have at least 1 risk factor for diabetes.  Colorectal cancer can be detected and often prevented. Most routine colorectal cancer screening begins at the age of 52 and continues through age 28. However, your health care provider may recommend screening at an earlier age if you have risk factors for colon cancer. On a yearly basis, your health care provider may provide home test kits to check for hidden blood in the stool. A small camera at the end of a tube may be used to directly examine the colon (sigmoidoscopy or colonoscopy) to detect the earliest forms of colorectal cancer. Talk to your health care provider about this at age 32 when routine screening begins. A direct exam of the colon should be repeated every 5-10 years through age 9, unless early forms of precancerous polyps or small growths are found.  People who are at an increased risk for hepatitis B should be screened for this virus. You are considered at high risk for hepatitis B if:  You were born in a country where hepatitis B occurs often.  Talk with your health care provider about which countries are considered high risk.  Your parents were born in a high-risk country and you have not received a shot to protect against hepatitis B (hepatitis B vaccine).  You have HIV or AIDS.  You use needles to inject street drugs.  You live with, or have sex with, someone who has hepatitis B.  You are a man who has sex with other men (MSM).  You get hemodialysis treatment.  You take certain medicines for conditions like cancer, organ transplantation, and autoimmune conditions.  Hepatitis C blood testing is recommended for all people born from [birth year] through 1963 and any individual with known risk factors for hepatitis C.  Healthy men should no longer receive prostate-specific antigen (PSA) blood tests as part of routine cancer screening. Talk to your health care provider about prostate cancer screening.  Testicular cancer screening is not recommended for  adolescents or adult males who have no symptoms. Screening includes self-exam, a health care provider exam, and other screening tests. Consult with your health care provider about any symptoms you have or any concerns you have about testicular cancer.  Practice safe sex. Use condoms and avoid high-risk sexual practices to reduce the spread of sexually transmitted infections (STIs).  You should be screened for STIs, including gonorrhea and chlamydia if:  You are sexually active and are younger than 24 years.  You are older than 24 years, and your health care provider tells you that you are at risk for this type of infection.  Your sexual activity has changed since you were last screened, and you are at an increased risk for chlamydia or gonorrhea. Ask your health care provider if you are at risk.  If you are at risk of being infected with HIV, it is recommended that you take a prescription medicine daily to prevent HIV infection. This is called pre-exposure prophylaxis (PrEP). You are  considered at risk if:  You are a man who has sex with other men (MSM).  You are a heterosexual man who is sexually active with multiple partners.  You take drugs by injection.  You are sexually active with a partner who has HIV.  Talk with your health care provider about whether you are at high risk of being infected with HIV. If you choose to begin PrEP, you should first be tested for HIV. You should then be tested every 3 months for as long as you are taking PrEP.  Use sunscreen. Apply sunscreen liberally and repeatedly throughout the day. You should seek shade when your shadow is shorter than you. Protect yourself by wearing long sleeves, pants, a wide-brimmed hat, and sunglasses year round whenever you are outdoors.  Tell your health care provider of new moles or changes in moles, especially if there is a change in shape or color. Also, tell your health care provider if a mole is larger than the size of a pencil eraser.  A one-time screening for abdominal aortic aneurysm (AAA) and surgical repair of large AAAs by ultrasound is recommended for men aged 68-75 years who are current or former smokers.  Stay current with your vaccines (immunizations). This information is not intended to replace advice given to you by your health care provider. Make sure you discuss any questions you have with your health care provider. Document Released: 09/03/2007 Document Revised: 03/28/2014 Document Reviewed: 12/09/2014 Elsevier Interactive Patient Education  2017 [employer].

## 2016-03-14 NOTE — Assessment & Plan Note (Signed)
Check a1c 

## 2016-03-15 ENCOUNTER — Other Ambulatory Visit (INDEPENDENT_AMBULATORY_CARE_PROVIDER_SITE_OTHER): Payer: Commercial Managed Care - HMO

## 2016-03-15 ENCOUNTER — Ambulatory Visit (INDEPENDENT_AMBULATORY_CARE_PROVIDER_SITE_OTHER): Payer: Commercial Managed Care - HMO | Admitting: Internal Medicine

## 2016-03-15 ENCOUNTER — Encounter: Payer: Self-pay | Admitting: Internal Medicine

## 2016-03-15 VITALS — BP 130/88 | HR 78 | Temp 97.6°F | Resp 16 | Ht 71.0 in | Wt 234.0 lb

## 2016-03-15 DIAGNOSIS — E7800 Pure hypercholesterolemia, unspecified: Secondary | ICD-10-CM | POA: Diagnosis not present

## 2016-03-15 DIAGNOSIS — E032 Hypothyroidism due to medicaments and other exogenous substances: Secondary | ICD-10-CM

## 2016-03-15 DIAGNOSIS — C8583 Other specified types of non-Hodgkin lymphoma, intra-abdominal lymph nodes: Secondary | ICD-10-CM

## 2016-03-15 DIAGNOSIS — Z0000 Encounter for general adult medical examination without abnormal findings: Secondary | ICD-10-CM

## 2016-03-15 DIAGNOSIS — R7309 Other abnormal glucose: Secondary | ICD-10-CM

## 2016-03-15 DIAGNOSIS — Z114 Encounter for screening for human immunodeficiency virus [HIV]: Secondary | ICD-10-CM

## 2016-03-15 DIAGNOSIS — R030 Elevated blood-pressure reading, without diagnosis of hypertension: Secondary | ICD-10-CM

## 2016-03-15 LAB — LIPID PANEL
Cholesterol: 183 mg/dL (ref 0–200)
HDL: 50.7 mg/dL (ref >39.00)
LDL Cholesterol: 105 mg/dL — ABNORMAL HIGH (ref 0–99)
NONHDL: 131.98
TRIGLYCERIDES: 136 mg/dL (ref 0.0–149.0)
Total CHOL/HDL Ratio: 4
VLDL: 27.2 mg/dL (ref 0.0–40.0)

## 2016-03-15 LAB — COMPREHENSIVE METABOLIC PANEL
ALK PHOS: 60 U/L (ref 39–117)
ALT: 18 U/L (ref 0–53)
AST: 21 U/L (ref 0–37)
Albumin: 4.5 g/dL (ref 3.5–5.2)
BILIRUBIN TOTAL: 0.5 mg/dL (ref 0.2–1.2)
BUN: 19 mg/dL (ref 6–23)
CO2: 28 meq/L (ref 19–32)
CREATININE: 1.16 mg/dL (ref 0.40–1.50)
Calcium: 9.5 mg/dL (ref 8.4–10.5)
Chloride: 102 mEq/L (ref 96–112)
GFR: 67.48 mL/min (ref >60.00)
GLUCOSE: 93 mg/dL (ref 70–99)
Potassium: 4.5 mEq/L (ref 3.5–5.1)
Sodium: 137 mEq/L (ref 135–145)
TOTAL PROTEIN: 7.8 g/dL (ref 6.0–8.3)

## 2016-03-15 LAB — TSH: TSH: 5.45 u[IU]/mL — AB (ref 0.35–4.50)

## 2016-03-15 MED ORDER — THERA VITAL M PO TABS
1.0000 | ORAL_TABLET | Freq: Every day | ORAL | Status: DC
Start: 2016-03-15 — End: 2018-04-10

## 2016-03-15 NOTE — Assessment & Plan Note (Signed)
Sees Dr Marin Olp Q 6 months No evidence of recurrence

## 2016-03-15 NOTE — Assessment & Plan Note (Signed)
BP at home 120/80's  Probable white coat htn Monitor at home

## 2016-03-15 NOTE — Assessment & Plan Note (Signed)
Check lipid panel  Continue daily statin Regular exercise and healthy diet encouraged  

## 2016-03-15 NOTE — Progress Notes (Signed)
Pre visit review using our clinic review tool, if applicable. No additional management support is needed unless otherwise documented below in the visit note. 

## 2016-03-15 NOTE — Assessment & Plan Note (Signed)
Check tsh  Titrate med dose if needed  

## 2016-03-16 LAB — PSA, TOTAL AND FREE
PSA, % FREE: 42 % (ref >25)
PSA, Free: 0.5 ng/mL
PSA, TOTAL: 1.2 ng/mL (ref <=4.0)

## 2016-03-16 LAB — HIV ANTIBODY (ROUTINE TESTING W REFLEX): HIV: NONREACTIVE

## 2016-03-20 ENCOUNTER — Other Ambulatory Visit: Payer: Self-pay | Admitting: Internal Medicine

## 2016-03-20 DIAGNOSIS — E032 Hypothyroidism due to medicaments and other exogenous substances: Secondary | ICD-10-CM

## 2016-03-20 MED ORDER — LEVOTHYROXINE SODIUM 112 MCG PO TABS: 112.0000 ug | ORAL_TABLET | Freq: Every day | ORAL | 3 refills | Status: DC

## 2016-04-25 ENCOUNTER — Ambulatory Visit (HOSPITAL_BASED_OUTPATIENT_CLINIC_OR_DEPARTMENT_OTHER): Payer: Commercial Managed Care - HMO | Admitting: Hematology & Oncology

## 2016-04-25 ENCOUNTER — Other Ambulatory Visit (HOSPITAL_BASED_OUTPATIENT_CLINIC_OR_DEPARTMENT_OTHER): Payer: Commercial Managed Care - HMO

## 2016-04-25 VITALS — BP 151/81 | HR 76 | Temp 97.8°F | Wt 233.4 lb

## 2016-04-25 DIAGNOSIS — R739 Hyperglycemia, unspecified: Secondary | ICD-10-CM

## 2016-04-25 DIAGNOSIS — Z8572 Personal history of non-Hodgkin lymphomas: Secondary | ICD-10-CM

## 2016-04-25 DIAGNOSIS — E032 Hypothyroidism due to medicaments and other exogenous substances: Secondary | ICD-10-CM

## 2016-04-25 DIAGNOSIS — C8583 Other specified types of non-Hodgkin lymphoma, intra-abdominal lymph nodes: Secondary | ICD-10-CM

## 2016-04-25 DIAGNOSIS — E559 Vitamin D deficiency, unspecified: Secondary | ICD-10-CM

## 2016-04-25 LAB — CBC WITH DIFFERENTIAL (CANCER CENTER ONLY)
BASO#: 0.1 10*3/uL (ref 0.0–0.2)
BASO%: 0.8 % (ref 0.0–2.0)
EOS ABS: 0.2 10*3/uL (ref 0.0–0.5)
EOS%: 2.5 % (ref 0.0–7.0)
HEMATOCRIT: 44.3 % (ref 38.7–49.9)
HGB: 14.7 g/dL (ref 13.0–17.1)
LYMPH#: 3.1 10*3/uL (ref 0.9–3.3)
LYMPH%: 42.5 % (ref 14.0–48.0)
MCH: 30.2 pg (ref 28.0–33.4)
MCHC: 33.2 g/dL (ref 32.0–35.9)
MCV: 91 fL (ref 82–98)
MONO#: 0.7 10*3/uL (ref 0.1–0.9)
MONO%: 10 % (ref 0.0–13.0)
NEUT#: 3.2 10*3/uL (ref 1.5–6.5)
NEUT%: 44.2 % (ref 40.0–80.0)
PLATELETS: 151 10*3/uL (ref 145–400)
RBC: 4.86 10*6/uL (ref 4.20–5.70)
RDW: 13.1 % (ref 11.1–15.7)
WBC: 7.3 10*3/uL (ref 4.0–10.0)

## 2016-04-25 LAB — CMP (CANCER CENTER ONLY)
ALBUMIN: 4 g/dL (ref 3.3–5.5)
ALK PHOS: 59 U/L (ref 26–84)
ALT(SGPT): 27 U/L (ref 10–47)
AST: 29 U/L (ref 11–38)
BUN: 14 mg/dL (ref 7–22)
CALCIUM: 9.6 mg/dL (ref 8.0–10.3)
CHLORIDE: 102 meq/L (ref 98–108)
CO2: 29 mEq/L (ref 18–33)
Creat: 1.1 mg/dl (ref 0.6–1.2)
Glucose, Bld: 94 mg/dL (ref 73–118)
POTASSIUM: 4.1 meq/L (ref 3.3–4.7)
Sodium: 142 mEq/L (ref 128–145)
TOTAL PROTEIN: 7.5 g/dL (ref 6.4–8.1)
Total Bilirubin: 1 mg/dl (ref 0.20–1.60)

## 2016-04-25 LAB — LACTATE DEHYDROGENASE: LDH: 216 U/L (ref 125–245)

## 2016-04-25 NOTE — Progress Notes (Signed)
Hematology and Oncology Follow Up Visit  [name] [name] [id number] 1951-02-04 66 y.o. 04/25/2016   Principle Diagnosis:  Marginal zone lymphoma-clinical remission  Current Therapy:    Observation     Interim History:  [name] [name] is back for followup. He looks good. He feels pretty good. He's had no issues since we last saw him. He is on Synthroid. He was they have the dose of Synthroid increased to 112 g.  He had a great Christmas and New Year's. When I last saw him, he was [place]. He had a really wonderful time up there.   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 He has been quite busy.   He's had no problems with the flu. He did get the flu vaccine.   He is taking vitamin D.   He has had no problems with bowels or bladder. He's had no rashes. He's had no leg swelling. He's had no cough.  Overall, his performance status is ECOG 0.  Medications:  Current Outpatient Prescriptions:  .  aspirin EC 81 MG tablet, Take 81 mg by mouth at bedtime., Disp: , Rfl:  .  cholecalciferol (VITAMIN D) 1000 UNITS tablet, Take 1,000 Units by mouth daily. , Disp: , Rfl:  .  levothyroxine (SYNTHROID, LEVOTHROID) 112 MCG tablet, Take 1 tablet (112 mcg total) by mouth daily., Disp: 90 tablet, Rfl: 3 .  Multiple Vitamins-Minerals (MULTIVITAMIN) tablet, Take 1 tablet by mouth daily., Disp: , Rfl:  .  pravastatin (PRAVACHOL) 40 MG tablet, Take 1 tablet (40 mg total) by mouth daily., Disp: 30 tablet, Rfl: 5  Allergies: No Known Allergies  Past Medical History, Surgical history, Social history, and Family History were reviewed and updated.  Review of Systems: As above  Physical Exam:  weight is 233 lb 6.4 oz (105.9 kg). His oral temperature is 97.8 F (36.6 C). His blood pressure is 151/81 (abnormal) and his pulse is 76.   Well-developed and well-nourished white gentleman. Head and neck exam shows no ocular or oral lesions.  There are no palpable cervical or supraclavicular lymph nodes. Lungs are clear. Cardiac exam regular rate and rhythm with no murmurs, rubs or bruits. Axillary exam shows no bilateral axillary. Abdomen is soft. She has good bowel sounds. There may be some slight tenderness over the left side. There is no splenomegaly. Liver edge is not palpable. Rectal exam shows no external hemorrhoids. There is no bleeding. He has no fissures. Extremities shows no clubbing, cyanosis or edema. Neurological exam is nonfocal. Skin exam no rashes, ecchymosis or petechia.  Lab Results  Component Value Date   WBC 7.3 04/25/2016   HGB 14.7 04/25/2016   HCT 44.3 04/25/2016   MCV 91 04/25/2016   PLT 151 04/25/2016     Chemistry      Component Value Date/Time   NA 142 04/25/2016 0844   NA 140 10/19/2015 0903   K 4.1 04/25/2016 0844   K 4.1 10/19/2015 0903   CL 102 04/25/2016 0844   CO2 29 04/25/2016 0844   CO2 25 10/19/2015 0903   BUN 14 04/25/2016 0844   BUN 21.6 10/19/2015 0903   CREATININE 1.1 04/25/2016 0844   CREATININE 1.3 10/19/2015 0903      Component Value Date/Time   CALCIUM 9.6 04/25/2016 0844   CALCIUM 9.4 10/19/2015 0903   ALKPHOS 59 04/25/2016 0844   ALKPHOS 68 10/19/2015 0903   AST 29 04/25/2016  0844   AST 21 10/19/2015 0903   ALT 27 04/25/2016 0844   ALT 19 10/19/2015 0903   BILITOT 1.00 04/25/2016 0844   BILITOT 0.54 10/19/2015 0903         Impression and Plan: [name] [name] is 66 year old gentleman with a history of a low-grade lymphoma. He underwent chemotherapy for this. He has done well. He got 6 cycles of R.-CVP. He completed this back in February of 2013.  He is doing quite well. He looks great. He is trying to lose some weight. He is trying to exercise a little more.  He's had no problems with respect to lymphoma. There's been no complications from any of the chemotherapy.  His now been 5 years since she had treatment. I think we can let him go from the practice now. I'm  not sure we really are adding much to his overall medical care.  I will be 1 having to see him back if he has any problems in the future.    [name], MD 2/5/20189:25 AM

## 2016-04-26 ENCOUNTER — Telehealth: Payer: Self-pay | Admitting: *Deleted

## 2016-04-26 LAB — VITAMIN D 25 HYDROXY (VIT D DEFICIENCY, FRACTURES): Vitamin D, 25-Hydroxy: 37 ng/mL (ref 30.0–100.0)

## 2016-04-26 NOTE — Telephone Encounter (Addendum)
Message left on personal voice mail  ----- Message from [name], MD sent at 04/26/2016  7:32 AM EST ----- Call - vit D level is ok!!!  pete

## 2016-05-19 ENCOUNTER — Other Ambulatory Visit (INDEPENDENT_AMBULATORY_CARE_PROVIDER_SITE_OTHER): Payer: Commercial Managed Care - HMO

## 2016-05-19 DIAGNOSIS — E032 Hypothyroidism due to medicaments and other exogenous substances: Secondary | ICD-10-CM

## 2016-05-19 LAB — TSH: TSH: 4.38 u[IU]/mL (ref 0.35–4.50)

## 2016-05-20 ENCOUNTER — Encounter: Payer: Self-pay | Admitting: Internal Medicine

## 2016-07-20 ENCOUNTER — Other Ambulatory Visit: Payer: Self-pay | Admitting: Internal Medicine

## 2016-07-22 ENCOUNTER — Encounter: Payer: Self-pay | Admitting: Internal Medicine

## 2016-07-22 ENCOUNTER — Ambulatory Visit (INDEPENDENT_AMBULATORY_CARE_PROVIDER_SITE_OTHER): Payer: Commercial Managed Care - HMO | Admitting: Internal Medicine

## 2016-07-22 DIAGNOSIS — J209 Acute bronchitis, unspecified: Secondary | ICD-10-CM | POA: Diagnosis not present

## 2016-07-22 DIAGNOSIS — R062 Wheezing: Secondary | ICD-10-CM | POA: Diagnosis not present

## 2016-07-22 MED ORDER — LEVOFLOXACIN 500 MG PO TABS: 500.0000 mg | ORAL_TABLET | Freq: Every day | ORAL | 0 refills | Status: DC

## 2016-07-22 MED ORDER — METHYLPREDNISOLONE 4 MG PO TBPK: ORAL_TABLET | ORAL | 0 refills | Status: DC

## 2016-07-22 NOTE — Assessment & Plan Note (Signed)
Mild on exam Will prescribe medrol dose pak in addition to antibiotic

## 2016-07-22 NOTE — Assessment & Plan Note (Signed)
Symptoms consistent with a bacterial bronchitis - similar to what he has had int the past Mild wheezing on exam Start levaquin Start medrol dose pak Has prescription cough syrup for night time Rest, fluids otc cold meds prn Call if no improvement

## 2016-07-22 NOTE — Progress Notes (Signed)
Pre visit review using our clinic review tool, if applicable. No additional management support is needed unless otherwise documented below in the visit note. 

## 2016-07-22 NOTE — Patient Instructions (Signed)
I have prescribed an antibiotic and short course of steroids.   Your prescription(s) have been submitted to your pharmacy or been printed and provided for you. Please take as directed and contact our office if you believe you are having problem(s) with the medication(s) or have any questions.  If your symptoms worsen or fail to improve, please contact our office for further instruction, or in case of emergency go directly to the emergency room at the closest medical facility.   General Recommendations:    Please drink plenty of fluids.  Get plenty of rest   Sleep in humidified air  Use saline nasal sprays  Netti pot  OTC Medications:  Decongestants - helps relieve congestion   Flonase (generic fluticasone) or Nasacort (generic triamcinolone) - please make sure to use the "cross-over" technique at a 45 degree angle towards the opposite eye as opposed to straight up the nasal passageway.   Sudafed (generic pseudoephedrine - Note this is the one that is available behind the pharmacy counter); Products with phenylephrine (-PE) may also be used but is often not as effective as pseudoephedrine.   If you have HIGH BLOOD PRESSURE - Coricidin HBP; AVOID any product that is -D as this contains pseudoephedrine which may increase your blood pressure.  Afrin (oxymetazoline) every 6-8 hours for up to 3 days.  Allergies - helps relieve runny nose, itchy eyes and sneezing   Claritin (generic loratidine), Allegra (fexofenidine), or Zyrtec (generic cyrterizine) for runny nose. These medications should not cause drowsiness.  Note - Benadryl (generic diphenhydramine) may be used however may cause drowsiness  Cough -   Delsym or Robitussin (generic dextromethorphan)  Expectorants - helps loosen mucus to ease removal   Mucinex (generic guaifenesin) as directed on the package.  Headaches / General Aches   Tylenol (generic acetaminophen) - DO NOT EXCEED 3 grams (3,000 mg)  in a 24 hour time period  Advil/Motrin (generic ibuprofen)  Sore Throat -   Salt water gargle   Chloraseptic (generic benzocaine) spray or lozenges / Sucrets (generic dyclonine)

## 2016-07-22 NOTE — Progress Notes (Signed)
Subjective:    Patient ID: [name] [name], male    DOB: 1950-05-28, 66 y.o.   MRN: [id number]  HPI He is here for an acute visit for cold symptoms.  His symptoms started two nights ago  He is experiencing sore throat, cough, achy, low grade fever, mild SOB, wheeze.   He denies sinus pain, nasal congestion, headaches, nausea and abdominal pain.    He has tried taking advil, prescription cough medication for sleep last night.     Medications and allergies reviewed with patient and updated if appropriate.  Patient Active Problem List   Diagnosis Date Noted  . Elevated glucose 04/08/2013  . Tubular adenoma of colon 04/08/2013  . 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 ELEVATED BLOOD PRESSURE WITHOUT DIAGNOSIS OF HYPERTENSION 08/21/2009  . Hyperlipidemia 07/04/2007  . Hypothyroidism 06/19/2007    Current Outpatient Prescriptions on File Prior to Visit  Medication Sig Dispense Refill  . aspirin EC 81 MG tablet Take 81 mg by mouth at bedtime.    . cholecalciferol (VITAMIN D) 1000 UNITS tablet Take 1,000 Units by mouth daily.     Marland Kitchen levothyroxine (SYNTHROID, LEVOTHROID) 112 MCG tablet Take 1 tablet (112 mcg total) by mouth daily. 90 tablet 3  . Multiple Vitamins-Minerals (MULTIVITAMIN) tablet Take 1 tablet by mouth daily.    . pravastatin (PRAVACHOL) 40 MG tablet TAKE 1 TABLET (40 MG TOTAL) BY MOUTH DAILY. 30 tablet 5   No current facility-administered medications on file prior to visit.     Past Medical History:  Diagnosis Date  . Arthritis   . Hemorrhoids 2014  . Hyperlipidemia   . 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 Thyroid disease 2007   hypothyroidism    Past Surgical History:  Procedure Laterality Date  . COLONOSCOPY  2008   Dr Fuller Plan  . COLONOSCOPY W/ POLYPECTOMY  2014   Dr Fuller Plan  . [facility] PLACEMENT  Dec 2012   REMOVED spring 2014  . TONSILLECTOMY      Social History   Social  History  . Marital status: Married    Spouse name: N/A  . Number of children: 2  . Years of education: N/A   Occupational History  . Retired     Airline pilot   Social History Main Topics  . Smoking status: Never Smoker  . Smokeless tobacco: Never Used     Comment: NEVER USED TOBACCO  . Alcohol use 0.0 oz/week     Comment:  1-2 wine / week  . Drug use: No  . Sexual activity: Not on file   Other Topics Concern  . Not on file   Social History Narrative   Exercise:  No regular exercise, active, yardwork    Family History  Problem Relation Age of Onset  . Alzheimer's disease Mother   . Heart attack Father 2  . Diabetes Brother   . Hypertension Brother      X 3  . Heart disease Paternal Grandfather     in 11s  . Coronary artery disease Brother     CBAG  . Cancer Neg Hx   . Stroke Neg Hx     Review of Systems  Constitutional: Positive for appetite change, fatigue and fever (low grade).  HENT: Positive for rhinorrhea, sore throat and voice change. Negative for congestion, ear pain, sinus pain and sinus pressure.   Respiratory: Positive for cough (mild production), shortness of breath and wheezing.   Cardiovascular:  Negative for chest pain.  Gastrointestinal: Negative for abdominal pain and nausea.  Musculoskeletal: Positive for myalgias.  Neurological: Positive for light-headedness. Negative for headaches.       Objective:   Vitals:   07/22/16 1039  BP: 140/74  Pulse: 81  Resp: 18  Temp: 98.5 F (36.9 C)   Filed Weights   07/22/16 1039  Weight: 238 lb (108 kg)   Body mass index is 33.19 kg/m.  Wt Readings from Last 3 Encounters:  07/22/16 238 lb (108 kg)  04/25/16 233 lb 6.4 oz (105.9 kg)  03/15/16 234 lb (106.1 kg)     Physical Exam GENERAL APPEARANCE: Appears stated age, mildly ill appearing, NAD EYES: conjunctiva clear, no icterus HEENT: bilateral tympanic membranes and ear canals normal, oropharynx with mild erythema, no thyromegaly, trachea  midline, no cervical or supraclavicular lymphadenopathy LUNGS: good air entry bilaterally, no crackles, coarse BS, mild wheeze HEART: Normal S1,S2 without murmurs EXTREMITIES: Without clubbing, cyanosis, or edema        Assessment & Plan:   See Problem List for Assessment and Plan of chronic medical problems.

## 2016-12-21 ENCOUNTER — Encounter: Payer: Self-pay | Admitting: Internal Medicine

## 2017-01-16 ENCOUNTER — Other Ambulatory Visit: Payer: Self-pay | Admitting: Internal Medicine

## 2017-02-13 ENCOUNTER — Encounter: Payer: Self-pay | Admitting: Internal Medicine

## 2017-02-13 NOTE — Telephone Encounter (Signed)
Patient called stating he is out of medication.  Didn't know that pharmacy would need approval.  Patient requesting call once sent.

## 2017-02-14 ENCOUNTER — Encounter: Payer: Self-pay | Admitting: Internal Medicine

## 2017-02-14 MED ORDER — LEVOTHYROXINE SODIUM 112 MCG PO TABS: 112.0000 ug | ORAL_TABLET | Freq: Every day | ORAL | 3 refills | Status: DC

## 2017-02-14 NOTE — Telephone Encounter (Signed)
New rx sent with ok to change manufacturer if needed

## 2017-03-03 ENCOUNTER — Ambulatory Visit: Payer: Self-pay | Admitting: Internal Medicine

## 2017-03-17 ENCOUNTER — Ambulatory Visit: Payer: Self-pay | Admitting: Internal Medicine

## 2017-03-19 NOTE — Patient Instructions (Addendum)
Test(s) ordered today. Your results will be released to [facility] (or called to you) after review, usually within 72hours after test completion. If any changes need to be made, you will be notified at that same time.  All other Health Maintenance issues reviewed.   All recommended immunizations and age-appropriate screenings are up-to-date or discussed.  No immunizations administered today.   Medications reviewed and updated.  No changes recommended at this time.  Your prescription(s) have been submitted to your pharmacy. Please take as directed and contact our office if you believe you are having problem(s) with the medication(s).  Please followup in one year    Health Maintenance, Male A healthy lifestyle and preventive care is important for your health and wellness. Ask your health care provider about what schedule of regular examinations is right for you. What should I know about weight and diet? Eat a Healthy Diet  Eat plenty of vegetables, fruits, whole grains, low-fat dairy products, and lean protein.  Do not eat a lot of foods high in solid fats, added sugars, or salt.  Maintain a Healthy Weight Regular exercise can help you achieve or maintain a healthy weight. You should:  Do at least 150 minutes of exercise each week. The exercise should increase your heart rate and make you sweat (moderate-intensity exercise).  Do strength-training exercises at least twice a week.  Watch Your Levels of Cholesterol and Blood Lipids  Have your blood tested for lipids and cholesterol every 5 years starting at 64 years of age. If you are at high risk for heart disease, you should start having your blood tested when you are 64 years old. You may need to have your cholesterol levels checked more often if: ? Your lipid or cholesterol levels are high. ? You are older than 64 years of age. ? You are at high risk for heart disease.  What should I know about cancer screening? Many types of  cancers can be detected early and may often be prevented. Lung Cancer  You should be screened every year for lung cancer if: ? You are a current smoker who has smoked for at least 30 years. ? You are a former smoker who has quit within the past 15 years.  Talk to your health care provider about your screening options, when you should start screening, and how often you should be screened.  Colorectal Cancer  Routine colorectal cancer screening usually begins at 64 years of age and should be repeated every 5-10 years until you are 64 years old. You may need to be screened more often if early forms of precancerous polyps or small growths are found. Your health care provider may recommend screening at an earlier age if you have risk factors for colon cancer.  Your health care provider may recommend using home test kits to check for hidden blood in the stool.  A small camera at the end of a tube can be used to examine your colon (sigmoidoscopy or colonoscopy). This checks for the earliest forms of colorectal cancer.  Prostate and Testicular Cancer  Depending on your age and overall health, your health care provider may do certain tests to screen for prostate and testicular cancer.  Talk to your health care provider about any symptoms or concerns you have about testicular or prostate cancer.  Skin Cancer  Check your skin from head to toe regularly.  Tell your health care provider about any new moles or changes in moles, especially if: ? There is a change  in a mole's size, shape, or color. ? You have a mole that is larger than a pencil eraser.  Always use sunscreen. Apply sunscreen liberally and repeat throughout the day.  Protect yourself by wearing long sleeves, pants, a wide-brimmed hat, and sunglasses when outside.  What should I know about heart disease, diabetes, and high blood pressure?  If you are 42-74 years of age, have your blood pressure checked every 3-5 years. If you are  39 years of age or older, have your blood pressure checked every year. You should have your blood pressure measured twice-once when you are at a hospital or clinic, and once when you are not at a hospital or clinic. Record the average of the two measurements. To check your blood pressure when you are not at a hospital or clinic, you can use: ? An automated blood pressure machine at a pharmacy. ? A home blood pressure monitor.  Talk to your health care provider about your target blood pressure.  If you are between 96-42 years old, ask your health care provider if you should take aspirin to prevent heart disease.  Have regular diabetes screenings by checking your fasting blood sugar level. ? If you are at a normal weight and have a low risk for diabetes, have this test once every three years after the age of 29. ? If you are overweight and have a high risk for diabetes, consider being tested at a younger age or more often.  A one-time screening for abdominal aortic aneurysm (AAA) by ultrasound is recommended for men aged 52-75 years who are current or former smokers. What should I know about preventing infection? Hepatitis B If you have a higher risk for hepatitis B, you should be screened for this virus. Talk with your health care provider to find out if you are at risk for hepatitis B infection. Hepatitis C Blood testing is recommended for:  Everyone born from [birth year] through 1963.  Anyone with known risk factors for hepatitis C.  Sexually Transmitted Diseases (STDs)  You should be screened each year for STDs including gonorrhea and chlamydia if: ? You are sexually active and are younger than 64 years of age. ? You are older than 64 years of age and your health care provider tells you that you are at risk for this type of infection. ? Your sexual activity has changed since you were last screened and you are at an increased risk for chlamydia or gonorrhea. Ask your health care provider if you  are at risk.  Talk with your health care provider about whether you are at high risk of being infected with HIV. Your health care provider may recommend a prescription medicine to help prevent HIV infection.  What else can I do?  Schedule regular health, dental, and eye exams.  Stay current with your vaccines (immunizations).  Do not use any tobacco products, such as cigarettes, chewing tobacco, and e-cigarettes. If you need help quitting, ask your health care provider.  Limit alcohol intake to no more than 2 drinks per day. One drink equals 12 ounces of beer, 5 ounces of wine, or 1 ounces of hard liquor.  Do not use street drugs.  Do not share needles.  Ask your health care provider for help if you need support or information about quitting drugs.  Tell your health care provider if you often feel depressed.  Tell your health care provider if you have ever been abused or do not feel safe at home. This  information is not intended to replace advice given to you by your health care provider. Make sure you discuss any questions you have with your health care provider. Document Released: 09/03/2007 Document Revised: 11/04/2015 Document Reviewed: 12/09/2014 Elsevier Interactive Patient Education  [employer].

## 2017-03-19 NOTE — Progress Notes (Signed)
Subjective:    Patient ID: [name] [name], male    DOB: Sep 25, 1950, 66 y.o.   MRN: [id number]  HPI He is here for a physical exam.   He has intermittent numbness/tingling in his first three fingers on his right hand.  He has had it for a while but it is worse.  He has mild weakness.  No left hand symptoms.  He has occasional b/l forearm achiness in the morning.   He has increased urination at night.  It is not every night.    Medications and allergies reviewed with patient and updated if appropriate.  Patient Active Problem List   Diagnosis Date Noted  . Elevated glucose 04/08/2013  . Tubular adenoma of colon 04/08/2013  . 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 ELEVATED BLOOD PRESSURE WITHOUT DIAGNOSIS OF HYPERTENSION 08/21/2009  . Hyperlipidemia 07/04/2007  . Hypothyroidism 06/19/2007    Current Outpatient Medications on File Prior to Visit  Medication Sig Dispense Refill  . aspirin EC 81 MG tablet Take 81 mg by mouth at bedtime.    . cholecalciferol (VITAMIN D) 1000 UNITS tablet Take 1,000 Units by mouth daily.     Marland Kitchen levothyroxine (SYNTHROID, LEVOTHROID) 112 MCG tablet Take 1 tablet (112 mcg total) by mouth daily. 90 tablet 3  . Multiple Vitamins-Minerals (MULTIVITAMIN) tablet Take 1 tablet by mouth daily.    . pravastatin (PRAVACHOL) 40 MG tablet TAKE 1 TABLET BY MOUTH EVERY DAY 30 tablet 2   No current facility-administered medications on file prior to visit.     Past Medical History:  Diagnosis Date  . Arthritis   . Hemorrhoids 2014  . Hyperlipidemia   . 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 Thyroid disease 2007   hypothyroidism    Past Surgical History:  Procedure Laterality Date  . COLONOSCOPY  2008   Dr Fuller Plan  . COLONOSCOPY W/ POLYPECTOMY  2014   Dr Fuller Plan  . [facility] PLACEMENT  Dec 2012   REMOVED spring 2014  . TONSILLECTOMY      Social History   Socioeconomic History  .  Marital status: Married    Spouse name: None  . Number of children: 2  . Years of education: None  . Highest education level: None  Social Needs  . Financial resource strain: None  . Food insecurity - worry: None  . Food insecurity - inability: None  . Transportation needs - medical: None  . Transportation needs - non-medical: None  Occupational History  . Occupation: Retired    Comment: Airline pilot  Tobacco Use  . Smoking status: Never Smoker  . Smokeless tobacco: Never Used  . Tobacco comment: NEVER USED TOBACCO  Substance and Sexual Activity  . Alcohol use: Yes    Alcohol/week: 0.0 oz    Comment:  1-2 wine / week  . Drug use: No  . Sexual activity: None  Other Topics Concern  . None  Social History Narrative   Exercise:  No regular exercise, active, yardwork    Family History  Problem Relation Age of Onset  . Alzheimer's disease Mother   . Heart attack Father 14  . Diabetes Brother   . Hypertension Brother         X 3  . Heart disease Paternal Grandfather        in 31s  . Coronary artery disease Brother        CBAG  . Cancer Neg Hx   .  Stroke Neg Hx     Review of Systems  Constitutional: Negative for chills and fever.  Eyes: Negative for visual disturbance.  Respiratory: Negative for cough, shortness of breath and wheezing.   Cardiovascular: Positive for leg swelling (occ with prolonged standing). Negative for chest pain and palpitations.  Gastrointestinal: Positive for anal bleeding (intermittent, minimal bleeding). Negative for abdominal pain, blood in stool, constipation, diarrhea and nausea.       No gerd  Genitourinary: Positive for frequency (some times a night). Negative for dysuria and hematuria.  Musculoskeletal: Positive for arthralgias (arthritis in hands) and back pain (lower back with prolonged standing).  Skin: Negative for color change and rash.       Dry patch on right temple  Neurological: Positive for numbness. Negative for light-headedness  and headaches.       Objective:   Vitals:   03/20/17 0805  BP: 132/86  Pulse: 76  Resp: 16  Temp: 98 F (36.7 C)  SpO2: 97%   Filed Weights   03/20/17 0805  Weight: 232 lb (105.2 kg)   Body mass index is 32.36 kg/m.  Wt Readings from Last 3 Encounters:  03/20/17 232 lb (105.2 kg)  07/22/16 238 lb (108 kg)  04/25/16 233 lb 6.4 oz (105.9 kg)     Physical Exam Constitutional: He appears well-developed and well-nourished. No distress.  HENT:  Head: Normocephalic and atraumatic.  Right Ear: External ear normal.  Left Ear: External ear normal.  Mouth/Throat: Oropharynx is clear and moist.  Normal ear canals and TM b/l  Eyes: Conjunctivae and EOM are normal.  Neck: Neck supple. No tracheal deviation present. No thyromegaly present.  No carotid bruit  Cardiovascular: Normal rate, regular rhythm, normal heart sounds and intact distal pulses.   No murmur heard. Pulmonary/Chest: Effort normal and breath sounds normal. No respiratory distress. He has no wheezes. He has no rales.  Abdominal: Soft. He exhibits no distension. There is no tenderness.  Genitourinary: prostate w/o nodules, no enlargement Musculoskeletal: He exhibits no edema.  Lymphadenopathy:   He has no cervical adenopathy.  Skin: Skin is warm and dry. He is not diaphoretic. dry patch temple, dryness medial eyebrows Psychiatric: He has a normal mood and affect. His behavior is normal.         Assessment & Plan:   Physical exam: Screening blood work   ordered Immunizations  Discussed shingrix - already had the first one, others up to date Colonoscopy   Up to date  Eye exams   Up to date  EKG  Last done 2013 Exercise  Walks about one mile most days Weight - advised weight loss Skin - dry patch right temple - advised derm evaluation Substance abuse   none  See Problem List for Assessment and Plan of chronic medical problems.   FU in one year

## 2017-03-20 ENCOUNTER — Other Ambulatory Visit (INDEPENDENT_AMBULATORY_CARE_PROVIDER_SITE_OTHER): Payer: 59

## 2017-03-20 ENCOUNTER — Encounter: Payer: Self-pay | Admitting: Internal Medicine

## 2017-03-20 ENCOUNTER — Ambulatory Visit (INDEPENDENT_AMBULATORY_CARE_PROVIDER_SITE_OTHER): Payer: 59 | Admitting: Internal Medicine

## 2017-03-20 VITALS — BP 132/86 | HR 76 | Temp 98.0°F | Resp 16 | Ht 71.0 in | Wt 232.0 lb

## 2017-03-20 DIAGNOSIS — R030 Elevated blood-pressure reading, without diagnosis of hypertension: Secondary | ICD-10-CM

## 2017-03-20 DIAGNOSIS — C8583 Other specified types of non-Hodgkin lymphoma, intra-abdominal lymph nodes: Secondary | ICD-10-CM

## 2017-03-20 DIAGNOSIS — R7309 Other abnormal glucose: Secondary | ICD-10-CM

## 2017-03-20 DIAGNOSIS — E032 Hypothyroidism due to medicaments and other exogenous substances: Secondary | ICD-10-CM | POA: Diagnosis not present

## 2017-03-20 DIAGNOSIS — R200 Anesthesia of skin: Secondary | ICD-10-CM | POA: Insufficient documentation

## 2017-03-20 DIAGNOSIS — R202 Paresthesia of skin: Secondary | ICD-10-CM

## 2017-03-20 DIAGNOSIS — Z0000 Encounter for general adult medical examination without abnormal findings: Secondary | ICD-10-CM

## 2017-03-20 DIAGNOSIS — E7849 Other hyperlipidemia: Secondary | ICD-10-CM

## 2017-03-20 LAB — CBC WITH DIFFERENTIAL/PLATELET
BASOS ABS: 0.1 10*3/uL (ref 0.0–0.1)
Basophils Relative: 0.9 % (ref 0.0–3.0)
EOS PCT: 2.3 % (ref 0.0–5.0)
Eosinophils Absolute: 0.2 10*3/uL (ref 0.0–0.7)
HEMATOCRIT: 42.7 % (ref 39.0–52.0)
Hemoglobin: 14.2 g/dL (ref 13.0–17.0)
LYMPHS ABS: 3.7 10*3/uL (ref 0.7–4.0)
LYMPHS PCT: 47.7 % — AB (ref 12.0–46.0)
MCHC: 33.2 g/dL (ref 30.0–36.0)
MCV: 90.9 fl (ref 78.0–100.0)
MONOS PCT: 6.4 % (ref 3.0–12.0)
Monocytes Absolute: 0.5 10*3/uL (ref 0.1–1.0)
NEUTROS ABS: 3.3 10*3/uL (ref 1.4–7.7)
NEUTROS PCT: 42.7 % — AB (ref 43.0–77.0)
Platelets: 183 10*3/uL (ref 150.0–400.0)
RBC: 4.71 Mil/uL (ref 4.22–5.81)
RDW: 13.2 % (ref 11.5–15.5)
WBC: 7.8 10*3/uL (ref 4.0–10.5)

## 2017-03-20 LAB — COMPREHENSIVE METABOLIC PANEL
ALK PHOS: 52 U/L (ref 39–117)
ALT: 16 U/L (ref 0–53)
AST: 19 U/L (ref 0–37)
Albumin: 4.4 g/dL (ref 3.5–5.2)
BUN: 16 mg/dL (ref 6–23)
CALCIUM: 9.1 mg/dL (ref 8.4–10.5)
CO2: 27 mEq/L (ref 19–32)
Chloride: 103 mEq/L (ref 96–112)
Creatinine, Ser: 1.07 mg/dL (ref 0.40–1.50)
GFR: 73.84 mL/min (ref >60.00)
GLUCOSE: 91 mg/dL (ref 70–99)
POTASSIUM: 4.3 meq/L (ref 3.5–5.1)
Sodium: 138 mEq/L (ref 135–145)
Total Bilirubin: 0.5 mg/dL (ref 0.2–1.2)
Total Protein: 7.5 g/dL (ref 6.0–8.3)

## 2017-03-20 LAB — LIPID PANEL
CHOL/HDL RATIO: 4
Cholesterol: 160 mg/dL (ref 0–200)
HDL: 40.9 mg/dL (ref >39.00)
LDL CALC: 90 mg/dL (ref 0–99)
NONHDL: 118.99
Triglycerides: 146 mg/dL (ref 0.0–149.0)
VLDL: 29.2 mg/dL (ref 0.0–40.0)

## 2017-03-20 LAB — HEMOGLOBIN A1C: HEMOGLOBIN A1C: 5.7 % (ref 4.6–6.5)

## 2017-03-20 LAB — TSH: TSH: 5.97 u[IU]/mL — AB (ref 0.35–4.50)

## 2017-03-20 MED ORDER — ZOSTER VAC RECOMB ADJUVANTED 50 MCG/0.5ML IM SUSR: 0.5000 mL | Freq: Once | INTRAMUSCULAR | 0 refills | Status: AC

## 2017-03-20 MED ORDER — LEVOTHYROXINE SODIUM 112 MCG PO TABS: 112.0000 ug | ORAL_TABLET | Freq: Every day | ORAL | 3 refills | Status: DC

## 2017-03-20 NOTE — Assessment & Plan Note (Signed)
Borderline today Will continue to monitor No medication at this time cmp

## 2017-03-20 NOTE — Assessment & Plan Note (Signed)
Check lipid panel  Continue daily statin Regular exercise and healthy diet encouraged  

## 2017-03-20 NOTE — Assessment & Plan Note (Signed)
Median nerve distribution Possibly CTS Advised wrist brace at night If no improvement will refer to ortho/sports medicine

## 2017-03-20 NOTE — Assessment & Plan Note (Signed)
a1c today

## 2017-03-20 NOTE — Assessment & Plan Note (Signed)
Check tsh  Titrate med dose if needed  

## 2017-03-20 NOTE — Assessment & Plan Note (Signed)
No longer following with oncology

## 2017-03-22 LAB — PSA, TOTAL AND FREE
PSA, % Free: 31 % (calc) (ref >25)
PSA, FREE: 0.4 ng/mL
PSA, TOTAL: 1.3 ng/mL (ref <=4.0)

## 2017-03-23 ENCOUNTER — Other Ambulatory Visit: Payer: Self-pay | Admitting: Emergency Medicine

## 2017-03-23 DIAGNOSIS — E032 Hypothyroidism due to medicaments and other exogenous substances: Secondary | ICD-10-CM

## 2017-03-23 MED ORDER — LEVOTHYROXINE SODIUM 125 MCG PO TABS: 125.0000 ug | ORAL_TABLET | Freq: Every day | ORAL | 1 refills | Status: DC

## 2017-04-18 ENCOUNTER — Other Ambulatory Visit: Payer: Self-pay | Admitting: Internal Medicine

## 2017-05-19 ENCOUNTER — Other Ambulatory Visit (INDEPENDENT_AMBULATORY_CARE_PROVIDER_SITE_OTHER): Payer: 59

## 2017-05-19 ENCOUNTER — Encounter: Payer: Self-pay | Admitting: Internal Medicine

## 2017-05-19 DIAGNOSIS — E032 Hypothyroidism due to medicaments and other exogenous substances: Secondary | ICD-10-CM

## 2017-05-19 LAB — TSH: TSH: 2.07 u[IU]/mL (ref 0.35–4.50)

## 2017-06-19 ENCOUNTER — Encounter: Payer: Self-pay | Admitting: Gastroenterology

## 2017-06-21 ENCOUNTER — Encounter: Payer: Self-pay | Admitting: Gastroenterology

## 2017-07-28 ENCOUNTER — Ambulatory Visit (AMBULATORY_SURGERY_CENTER): Payer: Self-pay | Admitting: *Deleted

## 2017-07-28 ENCOUNTER — Other Ambulatory Visit: Payer: Self-pay

## 2017-07-28 ENCOUNTER — Encounter: Payer: Self-pay | Admitting: Gastroenterology

## 2017-07-28 VITALS — Ht 71.5 in | Wt 224.0 lb

## 2017-07-28 DIAGNOSIS — Z86010 Personal history of colonic polyps: Secondary | ICD-10-CM

## 2017-07-28 MED ORDER — NA SULFATE-K SULFATE-MG SULF 17.5-3.13-1.6 GM/177ML PO SOLN: 1.0000 | Freq: Once | ORAL | 0 refills | Status: AC

## 2017-07-28 NOTE — Progress Notes (Signed)
No egg or soy allergy known to patient  No issues with past sedation with any surgeries or procedures- pt woke with 1st colon with F/V but did fine with +MAC in 2014, no intubation problems  No diet pills per patient No home 02 use per patient  No blood thinners per patient  Pt denies issues with constipation  No A fib or A flutter  EMMI video sent to pt's e mail - pt declined  $15 coup[on for Suprep given to pt in PV today

## 2017-08-11 ENCOUNTER — Encounter: Payer: 59 | Admitting: Gastroenterology

## 2017-09-16 ENCOUNTER — Other Ambulatory Visit: Payer: Self-pay | Admitting: Internal Medicine

## 2017-09-19 ENCOUNTER — Encounter: Payer: Self-pay | Admitting: Gastroenterology

## 2017-11-07 ENCOUNTER — Encounter: Payer: Self-pay | Admitting: Gastroenterology

## 2017-11-07 ENCOUNTER — Ambulatory Visit (AMBULATORY_SURGERY_CENTER): Payer: Self-pay

## 2017-11-07 ENCOUNTER — Other Ambulatory Visit: Payer: Self-pay

## 2017-11-07 VITALS — Ht 71.0 in | Wt 224.8 lb

## 2017-11-07 DIAGNOSIS — Z86010 Personal history of colonic polyps: Secondary | ICD-10-CM

## 2017-11-07 MED ORDER — NA SULFATE-K SULFATE-MG SULF 17.5-3.13-1.6 GM/177ML PO SOLN
1.0000 | Freq: Once | ORAL | 0 refills | Status: AC
Start: 2017-11-07 — End: 2017-11-07

## 2017-11-07 NOTE — Progress Notes (Signed)
No egg or soy allergy known to patient  No issues with past sedation with any surgeries  or procedures, no intubation problems . Pt verbalize woke up doing last procedure. No diet pills per patient No home 02 use per patient  No blood thinners per patient  Pt denies issues with constipation  No A fib or A flutter  EMMI video sent to pt's e mail , pt declined

## 2017-11-09 ENCOUNTER — Telehealth: Payer: Self-pay | Admitting: Gastroenterology

## 2017-11-09 NOTE — Telephone Encounter (Signed)
Pt return your call.

## 2017-11-09 NOTE — Telephone Encounter (Signed)
Patient states suprep is too expensive and wants to know if he can get the generic and if there are different instructions.

## 2017-11-09 NOTE — Telephone Encounter (Signed)
Pt states HTA will not pay for Suprep- is $95 and he cannot afford- pharmacy told pt he can have Gavilyte N- explained to pt yes he can have this but is a gallon and he will;l have to pick up new instructions - he states he does not want to have that volume- I offered a Suprep sample- pt will pick it up Friday before 5 pm  Lot [id number]  Exp 7/21  As directed

## 2017-11-09 NOTE — Telephone Encounter (Signed)
Called pt- LM to return call- marie Pv

## 2017-11-14 ENCOUNTER — Telehealth: Payer: Self-pay | Admitting: Gastroenterology

## 2017-11-14 NOTE — Telephone Encounter (Signed)
Pharmacist states insurance will pay for Trilyte- I will offer Mr [name] [name] a [name] patient  And LM to return my call-- Christia Reading sample-- Lot [id number]  Exp 7-/21  As directed - at the front desk 3rd floor

## 2017-11-14 NOTE — Telephone Encounter (Signed)
Pt returned call- he picked up a prep Friday - I returned this one to the sample cabinet

## 2017-11-22 ENCOUNTER — Encounter: Payer: Self-pay | Admitting: Gastroenterology

## 2017-11-22 ENCOUNTER — Ambulatory Visit (AMBULATORY_SURGERY_CENTER): Payer: PPO | Admitting: Gastroenterology

## 2017-11-22 VITALS — BP 117/63 | HR 59 | Temp 98.7°F | Resp 15 | Ht 71.0 in | Wt 232.0 lb

## 2017-11-22 DIAGNOSIS — Z86010 Personal history of colonic polyps: Secondary | ICD-10-CM | POA: Diagnosis not present

## 2017-11-22 DIAGNOSIS — Z1211 Encounter for screening for malignant neoplasm of colon: Secondary | ICD-10-CM | POA: Diagnosis not present

## 2017-11-22 MED ORDER — SODIUM CHLORIDE 0.9 % IV SOLN: 500.0000 mL | Freq: Once | INTRAVENOUS | Status: DC

## 2017-11-22 NOTE — Progress Notes (Signed)
Pt's states no medical or surgical changes since previsit or office visit. 

## 2017-11-22 NOTE — Patient Instructions (Signed)
**   Handouts given on diverticulosis, hemorrhoids, and high fiber diet **   YOU HAD AN ENDOSCOPIC PROCEDURE TODAY AT [facility]:   Refer to the procedure report that was given to you for any specific questions about what was found during the examination.  If the procedure report does not answer your questions, please call your gastroenterologist to clarify.  If you requested that your care partner not be given the details of your procedure findings, then the procedure report has been included in a sealed envelope for you to review at your convenience later.  YOU SHOULD EXPECT: Some feelings of bloating in the abdomen. Passage of more gas than usual.  Walking can help get rid of the air that was put into your GI tract during the procedure and reduce the bloating. If you had a lower endoscopy (such as a colonoscopy or flexible sigmoidoscopy) you may notice spotting of blood in your stool or on the toilet paper. If you underwent a bowel prep for your procedure, you may not have a normal bowel movement for a few days.  Please Note:  You might notice some irritation and congestion in your nose or some drainage.  This is from the oxygen used during your procedure.  There is no need for concern and it should clear up in a day or so.  SYMPTOMS TO REPORT IMMEDIATELY:   Following lower endoscopy (colonoscopy or flexible sigmoidoscopy):  Excessive amounts of blood in the stool  Significant tenderness or worsening of abdominal pains  Swelling of the abdomen that is new, acute  Fever of 100F or higher  For urgent or emergent issues, a gastroenterologist can be reached at any hour by calling [phone].   DIET:  We do recommend a small meal at first, but then you may proceed to your regular diet.  Drink plenty of fluids but you should avoid alcoholic beverages for 24 hours.  ACTIVITY:  You should plan to take it easy for the rest of today and you should NOT DRIVE or use heavy machinery  until tomorrow (because of the sedation medicines used during the test).    FOLLOW UP: Our staff will call the number listed on your records the next business day following your procedure to check on you and address any questions or concerns that you may have regarding the information given to you following your procedure. If we do not reach you, we will leave a message.  However, if you are feeling well and you are not experiencing any problems, there is no need to return our call.  We will assume that you have returned to your regular daily activities without incident.  If any biopsies were taken you will be contacted by phone or by letter within the next 1-3 weeks.  Please call us at [phone] if you have not heard about the biopsies in 3 weeks.    SIGNATURES/CONFIDENTIALITY: You and/or your care partner have signed paperwork which will be entered into your electronic medical record.  These signatures attest to the fact that that the information above on your After Visit Summary has been reviewed and is understood.  Full responsibility of the confidentiality of this discharge information lies with you and/or your care-partner.

## 2017-11-22 NOTE — Op Note (Signed)
[facility] Patient Name: [name] [name] Procedure Date: 11/22/2017 3:53 PM MRN: [id number] Endoscopist: Ladene Artist , MD Age: 67 Referring MD:  Date of Birth: 24-Jun-1950 Gender: Male Account #: [id number] Procedure:                Colonoscopy Indications:              Surveillance: Personal history of adenomatous                            polyps on last colonoscopy 5 years ago Medicines:                Monitored Anesthesia Care Procedure:                Pre-Anesthesia Assessment:                           - Prior to the procedure, a History and Physical                            was performed, and patient medications and                            allergies were reviewed. The patient's tolerance of                            previous anesthesia was also reviewed. The risks                            and benefits of the procedure and the sedation                            options and risks were discussed with the patient.                            All questions were answered, and informed consent                            was obtained. Prior Anticoagulants: The patient has                            taken no previous anticoagulant or antiplatelet                            agents. ASA Grade Assessment: II - A patient with                            mild systemic disease. After reviewing the risks                            and benefits, the patient was deemed in                            satisfactory condition to undergo the procedure.  After obtaining informed consent, the colonoscope                            was passed under direct vision. Throughout the                            procedure, the patient's blood pressure, pulse, and                            oxygen saturations were monitored continuously. The                            Colonoscope was introduced through the anus and                            advanced to the the cecum,  identified by                            appendiceal orifice and ileocecal valve. The                            ileocecal valve, appendiceal orifice, and rectum                            were photographed. The quality of the bowel                            preparation was adequate. The colonoscopy was                            performed without difficulty. The patient tolerated                            the procedure well. Scope In: 3:55:30 PM Scope Out: 4:08:22 PM Scope Withdrawal Time: 0 hours 7 minutes 31 seconds  Total Procedure Duration: 0 hours 12 minutes 52 seconds  Findings:                 The perianal and digital rectal examinations were                            normal.                           Multiple small-mouthed diverticula were found in                            the left colon.                           Internal hemorrhoids were found during                            retroflexion. The hemorrhoids were medium-sized and  Grade I (internal hemorrhoids that do not prolapse).                           The exam was otherwise without abnormality on                            direct and retroflexion views. Complications:            No immediate complications. Estimated blood loss:                            None. Estimated Blood Loss:     Estimated blood loss: none. Impression:               - Diverticulosis in the left colon.                           - Internal hemorrhoids.                           - The examination was otherwise normal on direct                            and retroflexion views.                           - No specimens collected. Recommendation:           - Repeat colonoscopy in 5 years for surveillance.                           - Patient has a contact number available for                            emergencies. The signs and symptoms of potential                            delayed complications were discussed with the                             patient. Return to normal activities tomorrow.                            Written discharge instructions were provided to the                            patient.                           - High fiber diet.                           - Continue present medications. Ladene Artist, MD 11/22/2017 4:11:55 PM This report has been signed electronically.

## 2017-11-22 NOTE — Progress Notes (Signed)
To PACU, VSS. Report to [email or web address] 

## 2017-11-23 ENCOUNTER — Telehealth: Payer: Self-pay

## 2017-11-23 NOTE — Telephone Encounter (Signed)
  Follow up Call-  Call back number 11/22/2017  Post procedure Call Back phone  # [phone]  Permission to leave phone message Yes  Some recent data might be hidden     Patient questions:  Do you have a fever, pain , or abdominal swelling? No. Pain Score  0 *  Have you tolerated food without any problems? Yes.    Have you been able to return to your normal activities? Yes.    Do you have any questions about your discharge instructions: Diet   No. Medications  No. Follow up visit  No.  Do you have questions or concerns about your Care? No.  Actions: * If pain score is 4 or above: No action needed, pain <4.

## 2017-12-11 ENCOUNTER — Other Ambulatory Visit: Payer: Self-pay | Admitting: Internal Medicine

## 2018-02-05 ENCOUNTER — Encounter: Payer: Self-pay | Admitting: Internal Medicine

## 2018-02-05 ENCOUNTER — Other Ambulatory Visit: Payer: Self-pay | Admitting: Internal Medicine

## 2018-02-05 ENCOUNTER — Ambulatory Visit (INDEPENDENT_AMBULATORY_CARE_PROVIDER_SITE_OTHER): Payer: PPO | Admitting: Internal Medicine

## 2018-02-05 VITALS — BP 142/74 | HR 74 | Temp 99.4°F | Resp 16 | Ht 71.0 in | Wt 220.8 lb

## 2018-02-05 DIAGNOSIS — R062 Wheezing: Secondary | ICD-10-CM | POA: Diagnosis not present

## 2018-02-05 DIAGNOSIS — J209 Acute bronchitis, unspecified: Secondary | ICD-10-CM

## 2018-02-05 MED ORDER — DOXYCYCLINE HYCLATE 100 MG PO TABS: 100.0000 mg | ORAL_TABLET | Freq: Two times a day (BID) | ORAL | 0 refills | Status: DC

## 2018-02-05 MED ORDER — HYDROCOD POLST-CPM POLST ER 10-8 MG/5ML PO SUER: 5.0000 mL | Freq: Two times a day (BID) | ORAL | 0 refills | Status: DC | PRN

## 2018-02-05 MED ORDER — METHYLPREDNISOLONE 4 MG PO TBPK: ORAL_TABLET | ORAL | 0 refills | Status: DC

## 2018-02-05 MED ORDER — ALBUTEROL SULFATE HFA 108 (90 BASE) MCG/ACT IN AERS: 2.0000 | INHALATION_SPRAY | Freq: Four times a day (QID) | RESPIRATORY_TRACT | 0 refills | Status: DC | PRN

## 2018-02-05 NOTE — Assessment & Plan Note (Signed)
He has diffuse expiratory wheezing on exam-secondary to acute bacterial bronchitis Doxycycline twice daily x10 days Tussionex cough syrup every 12 hours as needed Albuterol inhaler as needed Medrol Dosepak Over-the-counter cold medications as needed Increase rest and fluids Call if no improvement

## 2018-02-05 NOTE — Telephone Encounter (Signed)
Ok with you?

## 2018-02-05 NOTE — Progress Notes (Signed)
Subjective:    Patient ID: [name] [name], male    DOB: 01-03-1951, 67 y.o.   MRN: [id number]  HPI He is here for an acute visit for cold symptoms.  His symptoms started 4 days ago  He is experiencing fever up to 101, diaphoresis, fatigue, mild nasal congestion, sore throat, productive cough, wheezing and body aches.  He denies ear pain, sinus pain, chest tightness, shortness of breath, diarrhea, nausea and headaches.  He has tried taking delsym, advil   Medications and allergies reviewed with patient and updated if appropriate.  Patient Active Problem List   Diagnosis Date Noted  . Numbness and tingling in right hand 03/20/2017  . Elevated glucose 04/08/2013  . Tubular adenoma of colon 04/08/2013  . 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 ELEVATED BLOOD PRESSURE WITHOUT DIAGNOSIS OF HYPERTENSION 08/21/2009  . Hyperlipidemia 07/04/2007  . Hypothyroidism 06/19/2007    Current Outpatient Medications on File Prior to Visit  Medication Sig Dispense Refill  . aspirin EC 81 MG tablet Take 81 mg by mouth at bedtime.    Marland Kitchen levothyroxine (SYNTHROID, LEVOTHROID) 125 MCG tablet Take 1 tablet (125 mcg total) by mouth daily. -- Office visit needed for further refills 90 tablet 0  . Multiple Vitamins-Minerals (MULTIVITAMIN) tablet Take 1 tablet by mouth daily.    . pravastatin (PRAVACHOL) 40 MG tablet TAKE 1 TABLET BY MOUTH EVERY DAY 90 tablet 3   No current facility-administered medications on file prior to visit.     Past Medical History:  Diagnosis Date  . Arthritis   . Hemorrhoids 2014  . Hyperlipidemia   . 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 Thyroid disease 2007   hypothyroidism    Past Surgical History:  Procedure Laterality Date  . COLONOSCOPY  2008   Dr Fuller Plan  . COLONOSCOPY W/ POLYPECTOMY  2014   Dr Fuller Plan  . POLYPECTOMY    . [facility] PLACEMENT  Dec 2012   REMOVED spring 2014  . TONSILLECTOMY       Social History   Socioeconomic History  . Marital status: Married    Spouse name: Not on file  . Number of children: 2  . Years of education: Not on file  . Highest education level: Not on file  Occupational History  . Occupation: Retired    Comment: Scientist, water quality  . Financial resource strain: Not on file  . Food insecurity:    Worry: Not on file    Inability: Not on file  . Transportation needs:    Medical: Not on file    Non-medical: Not on file  Tobacco Use  . Smoking status: Never Smoker  . Smokeless tobacco: Never Used  . Tobacco comment: NEVER USED TOBACCO  Substance and Sexual Activity  . Alcohol use: Yes    Alcohol/week: 0.0 standard drinks    Comment:  1-2 wine / week  . Drug use: No  . Sexual activity: Not on file  Lifestyle  . Physical activity:    Days per week: Not on file    Minutes per session: Not on file  . Stress: Not on file  Relationships  . Social connections:    Talks on phone: Not on file    Gets together: Not on file    Attends religious service: Not on file    Active member of club or organization: Not on file    Attends meetings of clubs or organizations: Not  on file    Relationship status: Not on file  Other Topics Concern  . Not on file  Social History Narrative   Exercise:  No regular exercise, active, yardwork    Family History  Problem Relation Age of Onset  . Alzheimer's disease Mother   . Heart attack Father 70  . Diabetes Brother   . Hypertension Brother         X 3  . Heart disease Paternal Grandfather        in 51s  . Coronary artery disease Brother        CBAG  . Cancer Neg Hx   . Stroke Neg Hx   . Colon cancer Neg Hx   . Colon polyps Neg Hx   . Rectal cancer Neg Hx   . Stomach cancer Neg Hx   . Esophageal cancer Neg Hx     Review of Systems  Constitutional: Positive for diaphoresis, fatigue and fever (101).  HENT: Positive for congestion (mild) and sore throat. Negative for ear pain, sinus  pressure and sinus pain.   Respiratory: Positive for cough (productive in past two days) and wheezing. Negative for chest tightness and shortness of breath.   Gastrointestinal: Negative for diarrhea and nausea.  Musculoskeletal: Positive for myalgias.  Neurological: Negative for light-headedness and headaches.       Objective:   Vitals:   02/05/18 1019  BP: (!) 142/74  Pulse: 74  Resp: 16  Temp: 99.4 F (37.4 C)  SpO2: 98%   Filed Weights   02/05/18 1019  Weight: 220 lb 12.8 oz (100.2 kg)   Body mass index is 30.8 kg/m.  Wt Readings from Last 3 Encounters:  02/05/18 220 lb 12.8 oz (100.2 kg)  11/22/17 232 lb (105.2 kg)  11/07/17 224 lb 12.8 oz (102 kg)     Physical Exam GENERAL APPEARANCE: Appears stated age, well appearing, NAD EYES: conjunctiva clear, no icterus HEENT: bilateral tympanic membranes and ear canals normal, oropharynx with mild erythema, no thyromegaly, trachea midline, no cervical or supraclavicular lymphadenopathy LUNGS:  unlabored breathing, good air entry bilaterally, diffuse mild expiratory wheezing, no crackles CARDIOVASCULAR: Normal S1,S2 without murmurs, no edema SKIN: warm, dry        Assessment & Plan:   See Problem List for Assessment and Plan of chronic medical problems.

## 2018-02-05 NOTE — Telephone Encounter (Signed)
Yes, ok 

## 2018-02-05 NOTE — Telephone Encounter (Signed)
This has already been re-sent in.

## 2018-02-05 NOTE — Patient Instructions (Signed)
Take the antibiotic as prescribed - complete the entire course.  Take the steroid as prescribed.  Use the cough syrup as needed.  Use the albuterol inhaler as needed for wheezing and chest tightness.  Continue over the counter cold medication, advil and tylenol.  Increase your fluids and rest.    Call if no improvement

## 2018-02-05 NOTE — Telephone Encounter (Signed)
Copied from [place] [phone]. Topic: Quick Communication - See Telephone Encounter >> Feb 05, 2018  1:02 PM Blase Mess A wrote: CRM for notification. See Telephone encounter for: 02/05/18. Leah from [place] inside of  [employer] requesting a script for Proair generic -replacing PROVENTIL HFA 108 (90 Base) MCG/ACT inhaler [[id number]] Please advise [phone]

## 2018-02-05 NOTE — Assessment & Plan Note (Signed)
Consistent with bacterial bronchitis Has associated wheezing Doxycycline twice daily x10 days Tussionex cough syrup every 12 hours as needed Albuterol inhaler as needed Medrol Dosepak Over-the-counter cold medications as needed Increase rest and fluids Call if no improvement

## 2018-02-28 ENCOUNTER — Other Ambulatory Visit: Payer: Self-pay | Admitting: Internal Medicine

## 2018-03-06 ENCOUNTER — Encounter: Payer: Self-pay | Admitting: Internal Medicine

## 2018-03-07 ENCOUNTER — Other Ambulatory Visit: Payer: Self-pay

## 2018-03-07 MED ORDER — LEVOTHYROXINE SODIUM 125 MCG PO TABS: 125.0000 ug | ORAL_TABLET | Freq: Every day | ORAL | 0 refills | Status: DC

## 2018-03-19 ENCOUNTER — Encounter: Payer: Self-pay | Admitting: Internal Medicine

## 2018-03-31 ENCOUNTER — Other Ambulatory Visit: Payer: Self-pay | Admitting: Internal Medicine

## 2018-04-10 NOTE — Progress Notes (Signed)
Subjective:    Patient ID: [name] [name], male    DOB: May 08, 1950, 68 y.o.   MRN: [id number]  HPI Here for a welcome to medicare wellness exam and an annual physical exam.   I have personally reviewed and have noted 1.The patient's medical and social history 2.Their use of alcohol, tobacco or illicit drugs 3.Their current medications and supplements 4.The patient's functional ability including ADL's, fall risks, home                 safety risk and hearing or visual impairment. 5.Diet and physical activities 6.Evidence for depression or mood disorders 7.Care team reviewed  -  Eye doctor, GI - Dr Fuller Plan    Are there smokers in your home (other than you)? No  Risk Factors Exercise: walking Dietary issues discussed:  Eats in mostly, avoids junk food, eats pretty healthy overall  Vitamin and supplement use:   None  - may restart vitamin d  Opiod use:   none  Cardiac risk factors: advanced age, hyperlipidemia, and obesity.  Depression Screen  Have you felt down, depressed or hopeless? No  Have you felt little interest or pleasure in doing things?  No  Activities of Daily Living In your present state of health, do you have any difficulty performing the following activities?:  Driving? No Managing money?  No Feeding yourself? No Getting from bed to chair? No Climbing a flight of stairs? No Preparing food and eating?: No Bathing or showering? No Getting dressed: No Getting to/using the toilet? No Moving around from place to place: No In the past year have you fallen or had a near fall?: yes - tripped when walking backward carrying something - mild bruising   Do you have more than one partner?  No  Hearing Difficulties: No Do you often ask people to speak up or repeat themselves? No Do you experience ringing or noises in your ears? No Do you have difficulty understanding soft or whispered voices?  No Vision:              Any change in vision: decreased vision - close and far             Up to date with eye exam:   yes  Memory:  Do you feel that you have a problem with memory? No  Do you often misplace items? No  Do you feel safe at home?  Yes  Cognitive Testing  Alert, Orientated? Yes  Normal Appearance? Yes  Recall of three objects?  Yes  Can perform simple calculations? Yes  Displays appropriate judgment? Yes  Can read the correct time from a watch face? Yes   Advanced Directives have been discussed with the patient? Yes     Medications and allergies reviewed with patient and updated if appropriate.  Patient Active Problem List   Diagnosis Date Noted  . Numbness and tingling in right hand 03/20/2017  . Hyperglycemia 11/28/2014  . Elevated glucose 04/08/2013  . Tubular adenoma of colon 04/08/2013  . 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 ELEVATED BLOOD PRESSURE WITHOUT DIAGNOSIS OF HYPERTENSION 08/21/2009  . Hyperlipidemia 07/04/2007  . Hypothyroidism 06/19/2007    Current Outpatient Medications on File Prior to Visit  Medication Sig Dispense Refill  . aspirin EC 81 MG tablet Take 81 mg by mouth at bedtime.    Marland Kitchen levothyroxine (SYNTHROID, LEVOTHROID) 125 MCG tablet Take 1 tablet (125 mcg total) by mouth daily. -- Office visit needed  for further refills 30 tablet 0  . pravastatin (PRAVACHOL) 40 MG tablet TAKE 1 TABLET BY MOUTH EVERY DAY 90 tablet 3   No current facility-administered medications on file prior to visit.     Past Medical History:  Diagnosis Date  . Arthritis   . Hemorrhoids 2014  . Hyperlipidemia   . 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 Thyroid disease 2007   hypothyroidism    Past Surgical History:  Procedure Laterality Date  . COLONOSCOPY  2008   Dr Fuller Plan  . COLONOSCOPY W/ POLYPECTOMY  2014   Dr Fuller Plan  . POLYPECTOMY    . [facility] PLACEMENT  Dec 2012   REMOVED  spring 2014  . TONSILLECTOMY      Social History   Socioeconomic History  . Marital status: Married    Spouse name: Not on file  . Number of children: 2  . Years of education: Not on file  . Highest education level: Not on file  Occupational History  . Occupation: Retired    Comment: Scientist, water quality  . Financial resource strain: Not on file  . Food insecurity:    Worry: Not on file    Inability: Not on file  . Transportation needs:    Medical: Not on file    Non-medical: Not on file  Tobacco Use  . Smoking status: Never Smoker  . Smokeless tobacco: Never Used  . Tobacco comment: NEVER USED TOBACCO  Substance and Sexual Activity  . Alcohol use: Yes    Alcohol/week: 0.0 standard drinks    Comment:  1-2 wine / week  . Drug use: No  . Sexual activity: Not on file  Lifestyle  . Physical activity:    Days per week: Not on file    Minutes per session: Not on file  . Stress: Not on file  Relationships  . Social connections:    Talks on phone: Not on file    Gets together: Not on file    Attends religious service: Not on file    Active member of club or organization: Not on file    Attends meetings of clubs or organizations: Not on file    Relationship status: Not on file  Other Topics Concern  . Not on file  Social History Narrative   Exercise:  No regular exercise, active, yardwork    Family History  Problem Relation Age of Onset  . Alzheimer's disease Mother   . Heart attack Father 41  . Diabetes Brother   . Hypertension Brother         X 3  . Heart disease Paternal Grandfather        in 3s  . Coronary artery disease Brother        CBAG  . Cancer Neg Hx   . Stroke Neg Hx   . Colon cancer Neg Hx   . Colon polyps Neg Hx   . Rectal cancer Neg Hx   . Stomach cancer Neg Hx   . Esophageal cancer Neg Hx     Review of Systems  Constitutional: Negative for chills and fever.  HENT: Negative for hearing loss and tinnitus.   Eyes: Negative for  visual disturbance (worsening of vision related to age).  Respiratory: Negative for cough (residual from recent URI), shortness of breath and wheezing.   Cardiovascular: Positive for leg swelling (ankles at times). Negative for chest pain and palpitations.  Gastrointestinal: Positive for anal bleeding (hemorrhoidal ). Negative for  abdominal pain, blood in stool, constipation, diarrhea and nausea.       Rare gerd  Genitourinary: Positive for frequency. Negative for difficulty urinating, dysuria and hematuria.  Musculoskeletal: Positive for arthralgias (right toes).  Skin: Negative for color change and rash.  Neurological: Positive for light-headedness (occ) and numbness (right hand). Negative for dizziness and headaches.  Psychiatric/Behavioral: Negative for dysphoric mood. The patient is not nervous/anxious.        Objective:   Vitals:   04/11/18 0858  BP: (!) 144/88  Pulse: 76  Resp: 16  Temp: 98.4 F (36.9 C)  SpO2: 97%   Filed Weights   04/11/18 0858  Weight: 216 lb (98 kg)   Body mass index is 30.13 kg/m.  Wt Readings from Last 3 Encounters:  04/11/18 216 lb (98 kg)  02/05/18 220 lb 12.8 oz (100.2 kg)  11/22/17 232 lb (105.2 kg)     Visual Acuity Screening   Right eye Left eye Both eyes  Without correction: 20 25 20  70 20 25  With correction:         Physical Exam Constitutional: He appears well-developed and well-nourished. No distress.  HENT:  Head: Normocephalic and atraumatic.  Right Ear: External ear normal.  Left Ear: External ear normal.  Mouth/Throat: Oropharynx is clear and moist.  Normal ear canals and TM b/l  Eyes: Conjunctivae and EOM are normal.  Neck: Neck supple. No tracheal deviation present. No thyromegaly present. No carotid bruit  Cardiovascular: Normal rate, regular rhythm, normal heart sounds and intact distal pulses.  No murmur heard. Pulmonary/Chest: Effort normal and breath sounds normal. No respiratory distress. He has no wheezes. He  has no rales.  Abdominal: Soft. He exhibits no distension. There is no tenderness.  Genitourinary: deferred  Musculoskeletal: He exhibits no edema.  Lymphadenopathy:   He has no cervical adenopathy.  Skin: Skin is warm and dry. He is not diaphoretic.  Atypical looking mole left side of neck-irregular borders Psychiatric: He has a normal mood and affect. His behavior is normal.         Assessment & Plan:   Wellness Exam: Immunizations  All up to date Colonoscopy   Up to date  Eye exam  Up to date Hearing loss none Memory concerns/difficulties   none Independent of ADLs  - fully independent Stressed the importance of regular exercise EKG normal sinus rhythm at 71 bpm, normal EKG.  No change from prior EKGs   Patient received copy of preventative screening tests/immunizations recommended for the next 5-10 years.    Physical exam: Screening blood work  ordered Immunizations  All up to date Colonoscopy   Up to date  Eye exams  Up to date Exercise walking regularly for exercise Weight    will work on losing weight Skin    no concerns Substance abuse   none  See Problem List for Assessment and Plan of chronic medical problems.   Follow-up annually

## 2018-04-10 NOTE — Patient Instructions (Addendum)
[name] , Thank you for taking time to come for your Medicare Wellness Visit. I appreciate your ongoing commitment to your health goals. Please review the following plan we discussed and let me know if I can assist you in the future.   These are the goals we discussed: Goals   Start walking regularly     This is a list of the screening recommended for you and due dates:  Health Maintenance  Topic Date Due  . Pneumonia vaccines (2 of 2 - PPSV23) 12/17/2018  . Tetanus Vaccine  09/06/2020  . Colon Cancer Screening  11/23/2022  . Flu Shot  Completed  .  Hepatitis C: One time screening is recommended by Center for Disease Control  (CDC) for  adults born from [birth year] through 1963.   Completed  . HIV Screening  Completed      Tests ordered today. Your results will be released to [place] (or called to you) after review, usually within 72hours after test completion. If any changes need to be made, you will be notified at that same time.  All other Health Maintenance issues reviewed.   All recommended immunizations and age-appropriate screenings are up-to-date or discussed.  No immunizations administered today. An EKG was done today  Medications reviewed and updated.  Changes include :   none   A referral was ordered for dermatology  Please followup in one year    Health Maintenance, Male A healthy lifestyle and preventive care is important for your health and wellness. Ask your health care provider about what schedule of regular examinations is right for you. What should I know about weight and diet? Eat a Healthy Diet  Eat plenty of vegetables, fruits, whole grains, low-fat dairy products, and lean protein.  Do not eat a lot of foods high in solid fats, added sugars, or salt.  Maintain a Healthy Weight Regular exercise can help you achieve or maintain a healthy weight. You should:  Do at least 150 minutes of exercise each week. The exercise should increase your heart rate and  make you sweat (moderate-intensity exercise).  Do strength-training exercises at least twice a week. Watch Your Levels of Cholesterol and Blood Lipids  Have your blood tested for lipids and cholesterol every 5 years starting at 66 years of age. If you are at high risk for heart disease, you should start having your blood tested when you are 66 years old. You may need to have your cholesterol levels checked more often if: ? Your lipid or cholesterol levels are high. ? You are older than 66 years of age. ? You are at high risk for heart disease. What should I know about cancer screening? Many types of cancers can be detected early and may often be prevented. Lung Cancer  You should be screened every year for lung cancer if: ? You are a current smoker who has smoked for at least 30 years. ? You are a former smoker who has quit within the past 15 years.  Talk to your health care provider about your screening options, when you should start screening, and how often you should be screened. Colorectal Cancer  Routine colorectal cancer screening usually begins at 66 years of age and should be repeated every 5-10 years until you are 66 years old. You may need to be screened more often if early forms of precancerous polyps or small growths are found. Your health care provider may recommend screening at an earlier age if you have risk factors  for colon cancer.  Your health care provider may recommend using home test kits to check for hidden blood in the stool.  A small camera at the end of a tube can be used to examine your colon (sigmoidoscopy or colonoscopy). This checks for the earliest forms of colorectal cancer. Prostate and Testicular Cancer  Depending on your age and overall health, your health care provider may do certain tests to screen for prostate and testicular cancer.  Talk to your health care provider about any symptoms or concerns you have about testicular or prostate cancer. Skin  Cancer  Check your skin from head to toe regularly.  Tell your health care provider about any new moles or changes in moles, especially if: ? There is a change in a mole's size, shape, or color. ? You have a mole that is larger than a pencil eraser.  Always use sunscreen. Apply sunscreen liberally and repeat throughout the day.  Protect yourself by wearing long sleeves, pants, a wide-brimmed hat, and sunglasses when outside. What should I know about heart disease, diabetes, and high blood pressure?  If you are 78-61 years of age, have your blood pressure checked every 3-5 years. If you are 69 years of age or older, have your blood pressure checked every year. You should have your blood pressure measured twice-once when you are at a hospital or clinic, and once when you are not at a hospital or clinic. Record the average of the two measurements. To check your blood pressure when you are not at a hospital or clinic, you can use: ? An automated blood pressure machine at a pharmacy. ? A home blood pressure monitor.  Talk to your health care provider about your target blood pressure.  If you are between 92-74 years old, ask your health care provider if you should take aspirin to prevent heart disease.  Have regular diabetes screenings by checking your fasting blood sugar level. ? If you are at a normal weight and have a low risk for diabetes, have this test once every three years after the age of 86. ? If you are overweight and have a high risk for diabetes, consider being tested at a younger age or more often.  A one-time screening for abdominal aortic aneurysm (AAA) by ultrasound is recommended for men aged 45-75 years who are current or former smokers. What should I know about preventing infection? Hepatitis B If you have a higher risk for hepatitis B, you should be screened for this virus. Talk with your health care provider to find out if you are at risk for hepatitis B  infection. Hepatitis C Blood testing is recommended for:  Everyone born from [birth year] through 1963.  Anyone with known risk factors for hepatitis C. Sexually Transmitted Diseases (STDs)  You should be screened each year for STDs including gonorrhea and chlamydia if: ? You are sexually active and are younger than 66 years of age. ? You are older than 66 years of age and your health care provider tells you that you are at risk for this type of infection. ? Your sexual activity has changed since you were last screened and you are at an increased risk for chlamydia or gonorrhea. Ask your health care provider if you are at risk.  Talk with your health care provider about whether you are at high risk of being infected with HIV. Your health care provider may recommend a prescription medicine to help prevent HIV infection. What else can I do?  Schedule regular health, dental, and eye exams.  Stay current with your vaccines (immunizations).  Do not use any tobacco products, such as cigarettes, chewing tobacco, and e-cigarettes. If you need help quitting, ask your health care provider.  Limit alcohol intake to no more than 2 drinks per day. One drink equals 12 ounces of beer, 5 ounces of wine, or 1 ounces of hard liquor.  Do not use street drugs.  Do not share needles.  Ask your health care provider for help if you need support or information about quitting drugs.  Tell your health care provider if you often feel depressed.  Tell your health care provider if you have ever been abused or do not feel safe at home. This information is not intended to replace advice given to you by your health care provider. Make sure you discuss any questions you have with your health care provider. Document Released: 09/03/2007 Document Revised: 11/04/2015 Document Reviewed: 12/09/2014 Elsevier Interactive Patient Education  2019 [employer].

## 2018-04-11 ENCOUNTER — Encounter: Payer: Self-pay | Admitting: Internal Medicine

## 2018-04-11 ENCOUNTER — Other Ambulatory Visit (INDEPENDENT_AMBULATORY_CARE_PROVIDER_SITE_OTHER): Payer: PPO

## 2018-04-11 ENCOUNTER — Ambulatory Visit (INDEPENDENT_AMBULATORY_CARE_PROVIDER_SITE_OTHER): Payer: PPO | Admitting: Internal Medicine

## 2018-04-11 VITALS — BP 144/88 | HR 76 | Temp 98.4°F | Resp 16 | Ht 71.0 in | Wt 216.0 lb

## 2018-04-11 DIAGNOSIS — E7849 Other hyperlipidemia: Secondary | ICD-10-CM | POA: Diagnosis not present

## 2018-04-11 DIAGNOSIS — E032 Hypothyroidism due to medicaments and other exogenous substances: Secondary | ICD-10-CM

## 2018-04-11 DIAGNOSIS — Z125 Encounter for screening for malignant neoplasm of prostate: Secondary | ICD-10-CM

## 2018-04-11 DIAGNOSIS — Z0000 Encounter for general adult medical examination without abnormal findings: Secondary | ICD-10-CM

## 2018-04-11 DIAGNOSIS — C8583 Other specified types of non-Hodgkin lymphoma, intra-abdominal lymph nodes: Secondary | ICD-10-CM

## 2018-04-11 DIAGNOSIS — R030 Elevated blood-pressure reading, without diagnosis of hypertension: Secondary | ICD-10-CM | POA: Diagnosis not present

## 2018-04-11 DIAGNOSIS — R202 Paresthesia of skin: Secondary | ICD-10-CM

## 2018-04-11 DIAGNOSIS — R200 Anesthesia of skin: Secondary | ICD-10-CM | POA: Diagnosis not present

## 2018-04-11 DIAGNOSIS — D229 Melanocytic nevi, unspecified: Secondary | ICD-10-CM | POA: Insufficient documentation

## 2018-04-11 DIAGNOSIS — R739 Hyperglycemia, unspecified: Secondary | ICD-10-CM

## 2018-04-11 DIAGNOSIS — Z0001 Encounter for general adult medical examination with abnormal findings: Secondary | ICD-10-CM

## 2018-04-11 LAB — COMPREHENSIVE METABOLIC PANEL
ALT: 12 U/L (ref 0–53)
AST: 20 U/L (ref 0–37)
Albumin: 4.3 g/dL (ref 3.5–5.2)
Alkaline Phosphatase: 54 U/L (ref 39–117)
BUN: 18 mg/dL (ref 6–23)
CO2: 29 mEq/L (ref 19–32)
Calcium: 9.8 mg/dL (ref 8.4–10.5)
Chloride: 100 mEq/L (ref 96–112)
Creatinine, Ser: 1.16 mg/dL (ref 0.40–1.50)
GFR: 63.08 mL/min (ref >60.00)
Glucose, Bld: 92 mg/dL (ref 70–99)
Potassium: 4.4 mEq/L (ref 3.5–5.1)
Sodium: 136 mEq/L (ref 135–145)
Total Bilirubin: 0.7 mg/dL (ref 0.2–1.2)
Total Protein: 7.8 g/dL (ref 6.0–8.3)

## 2018-04-11 LAB — CBC WITH DIFFERENTIAL/PLATELET
Basophils Absolute: 0 10*3/uL (ref 0.0–0.1)
Basophils Relative: 0.5 % (ref 0.0–3.0)
Eosinophils Absolute: 0.1 10*3/uL (ref 0.0–0.7)
Eosinophils Relative: 1.3 % (ref 0.0–5.0)
HCT: 40.9 % (ref 39.0–52.0)
HEMOGLOBIN: 13.6 g/dL (ref 13.0–17.0)
Lymphocytes Relative: 53.1 % — ABNORMAL HIGH (ref 12.0–46.0)
Lymphs Abs: 4.1 10*3/uL — ABNORMAL HIGH (ref 0.7–4.0)
MCHC: 33.3 g/dL (ref 30.0–36.0)
MCV: 89.4 fl (ref 78.0–100.0)
MONOS PCT: 7.4 % (ref 3.0–12.0)
Monocytes Absolute: 0.6 10*3/uL (ref 0.1–1.0)
Neutro Abs: 2.9 10*3/uL (ref 1.4–7.7)
Neutrophils Relative %: 37.7 % — ABNORMAL LOW (ref 43.0–77.0)
Platelets: 168 10*3/uL (ref 150.0–400.0)
RBC: 4.58 Mil/uL (ref 4.22–5.81)
RDW: 13.6 % (ref 11.5–15.5)
WBC: 7.6 10*3/uL (ref 4.0–10.5)

## 2018-04-11 LAB — TSH: TSH: 3.53 u[IU]/mL (ref 0.35–4.50)

## 2018-04-11 LAB — LIPID PANEL
Cholesterol: 140 mg/dL (ref 0–200)
HDL: 39.7 mg/dL (ref >39.00)
LDL CALC: 80 mg/dL (ref 0–99)
NonHDL: 100.42
Total CHOL/HDL Ratio: 4
Triglycerides: 104 mg/dL (ref 0.0–149.0)
VLDL: 20.8 mg/dL (ref 0.0–40.0)

## 2018-04-11 LAB — HEMOGLOBIN A1C: Hgb A1c MFr Bld: 5.5 % (ref 4.6–6.5)

## 2018-04-11 LAB — PSA, MEDICARE: PSA: 2 ng/ml (ref 0.10–4.00)

## 2018-04-11 NOTE — Assessment & Plan Note (Signed)
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

## 2018-04-11 NOTE — Assessment & Plan Note (Signed)
CBC No longer following with oncology No symptoms consistent concerning for lymphoma

## 2018-04-11 NOTE — Assessment & Plan Note (Signed)
Suspicious looking moles left side of neck Advised that he needs to see dermatology and should not wait to make the appointment I will enter referral, but he will call his health insurance and see if they will assist

## 2018-04-11 NOTE — Assessment & Plan Note (Signed)
Clinically euthyroid Check tsh  Titrate med dose if needed  

## 2018-04-11 NOTE — Assessment & Plan Note (Addendum)
Check lipid panel, CMP, TSH Continue daily statin Regular exercise and healthy diet encouraged

## 2018-04-11 NOTE — Assessment & Plan Note (Signed)
Check a1c Low sugar / carb diet Stressed regular exercise   

## 2018-04-11 NOTE — Assessment & Plan Note (Addendum)
BP controlled at home when he checks it, but elevated here He will continue to monitor No change in medications Blood work

## 2018-04-12 ENCOUNTER — Encounter: Payer: Self-pay | Admitting: Internal Medicine

## 2018-04-12 MED ORDER — LEVOTHYROXINE SODIUM 125 MCG PO TABS: 125.0000 ug | ORAL_TABLET | Freq: Every day | ORAL | 3 refills | Status: DC

## 2018-04-16 DIAGNOSIS — L821 Other seborrheic keratosis: Secondary | ICD-10-CM | POA: Diagnosis not present

## 2018-04-16 DIAGNOSIS — D485 Neoplasm of uncertain behavior of skin: Secondary | ICD-10-CM | POA: Diagnosis not present

## 2018-04-16 DIAGNOSIS — D225 Melanocytic nevi of trunk: Secondary | ICD-10-CM | POA: Diagnosis not present

## 2018-04-16 DIAGNOSIS — D034 Melanoma in situ of scalp and neck: Secondary | ICD-10-CM | POA: Diagnosis not present

## 2018-04-26 DIAGNOSIS — D034 Melanoma in situ of scalp and neck: Secondary | ICD-10-CM | POA: Diagnosis not present

## 2018-07-19 ENCOUNTER — Encounter: Payer: Self-pay | Admitting: Internal Medicine

## 2018-07-19 DIAGNOSIS — Z08 Encounter for follow-up examination after completed treatment for malignant neoplasm: Secondary | ICD-10-CM | POA: Diagnosis not present

## 2018-07-19 DIAGNOSIS — Z85820 Personal history of malignant melanoma of skin: Secondary | ICD-10-CM | POA: Diagnosis not present

## 2018-07-19 DIAGNOSIS — X32XXXA Exposure to sunlight, initial encounter: Secondary | ICD-10-CM | POA: Diagnosis not present

## 2018-07-19 DIAGNOSIS — Z1283 Encounter for screening for malignant neoplasm of skin: Secondary | ICD-10-CM | POA: Diagnosis not present

## 2018-07-19 DIAGNOSIS — D485 Neoplasm of uncertain behavior of skin: Secondary | ICD-10-CM | POA: Diagnosis not present

## 2018-07-19 DIAGNOSIS — D225 Melanocytic nevi of trunk: Secondary | ICD-10-CM | POA: Diagnosis not present

## 2018-07-19 DIAGNOSIS — L570 Actinic keratosis: Secondary | ICD-10-CM | POA: Diagnosis not present

## 2018-07-20 ENCOUNTER — Encounter: Payer: Self-pay | Admitting: Internal Medicine

## 2018-07-20 DIAGNOSIS — C439 Malignant melanoma of skin, unspecified: Secondary | ICD-10-CM | POA: Insufficient documentation

## 2018-07-26 ENCOUNTER — Encounter: Payer: Self-pay | Admitting: Internal Medicine

## 2018-07-27 ENCOUNTER — Encounter: Payer: Self-pay | Admitting: Internal Medicine

## 2018-07-27 ENCOUNTER — Ambulatory Visit (INDEPENDENT_AMBULATORY_CARE_PROVIDER_SITE_OTHER): Payer: PPO | Admitting: Internal Medicine

## 2018-07-27 DIAGNOSIS — R509 Fever, unspecified: Secondary | ICD-10-CM | POA: Diagnosis not present

## 2018-07-27 NOTE — Assessment & Plan Note (Signed)
Started having a fever 2 days ago and it has occurred the past 2 nights up to 100.3.  Currently does not have a fever. He has associated body aches, headaches and fatigue with the fever He denies any other concerning symptoms Concern for possible COVID-19.  Other virus is possible No symptoms consistent with a bacterial infection Continue Tylenol.  Advised to avoid ibuprofen at this time Continue increase fluids and rest Discussed getting tested on Monday at the [facility] testing site-number given for him to call and try to make an appointment Stressed that if anything changes over the weekend he should call the office and speak with the on-call doctor

## 2018-07-27 NOTE — Progress Notes (Signed)
Virtual Visit via Video Note  I connected with [name] [name] on 07/27/18 at  3:00 PM EDT by a video enabled telemedicine application and verified that I am speaking with the correct person using two identifiers.   I discussed the limitations of evaluation and management by telemedicine and the availability of in person appointments. The patient expressed understanding and agreed to proceed.  The patient is currently at home and I am in the office.    No referring provider.    History of Present Illness: This visit is for an acute visit for cold symptoms.  His symptoms started two days ago.   He is experiencing low grade fever ( 99-100.3).  He took tylenol and his fever broke overnight.  He felt tired and achy.  The next day he felt ok.  He was active around the house and yesterday afternoon around 5 pm he started feeling bad.  His temp got to 100.3 and it stayed elevated all night.  This am it was 96.4.   Right now it was 96.4.  He also states decreased appetite.   He has tried taking tylenol and ibuprofen.  He has been to the grocery store and wears a mask.  He has been doing social distancing.  He went to his brother's house and dropped off some masks for him - he just stood in the doorway.   His wife is working and she has no symptoms.     Review of Systems  Constitutional: Positive for fever (low grade) and malaise/fatigue (with fever).  HENT: Negative for congestion, ear pain, sinus pain and sore throat.        Normal smell and taste, mild rhinorrhea at times  Respiratory: Negative for cough, shortness of breath and wheezing.   Cardiovascular: Negative for chest pain.  Gastrointestinal: Negative for diarrhea.  Genitourinary: Negative for dysuria.  Musculoskeletal: Positive for myalgias (with fever).  Neurological: Positive for headaches (with fever).      Social History   Socioeconomic History  . Marital status: Married    Spouse name: Not on file  . Number of  children: 2  . Years of education: Not on file  . Highest education level: Not on file  Occupational History  . Occupation: Retired    Comment: Scientist, water quality  . Financial resource strain: Not on file  . Food insecurity:    Worry: Not on file    Inability: Not on file  . Transportation needs:    Medical: Not on file    Non-medical: Not on file  Tobacco Use  . Smoking status: Never Smoker  . Smokeless tobacco: Never Used  . Tobacco comment: NEVER USED TOBACCO  Substance and Sexual Activity  . Alcohol use: Yes    Alcohol/week: 0.0 standard drinks    Comment:  1-2 wine / week  . Drug use: No  . Sexual activity: Not on file  Lifestyle  . Physical activity:    Days per week: Not on file    Minutes per session: Not on file  . Stress: Not on file  Relationships  . Social connections:    Talks on phone: Not on file    Gets together: Not on file    Attends religious service: Not on file    Active member of club or organization: Not on file    Attends meetings of clubs or organizations: Not on file    Relationship status: Not on file  Other Topics Concern  .  Not on file  Social History Narrative   Exercise:  No regular exercise, active, yardwork     Observations/Objective: Appears well in NAD Breathing normally, no evidence of shortness of breath  Assessment and Plan:  See Problem List for Assessment and Plan of chronic medical problems.   Follow Up Instructions:    I discussed the assessment and treatment plan with the patient. The patient was provided an opportunity to ask questions and all were answered. The patient agreed with the plan and demonstrated an understanding of the instructions.   The patient was advised to call back or seek an in-person evaluation if the symptoms worsen or if the condition fails to improve as anticipated.    Binnie Rail, MD

## 2018-07-30 DIAGNOSIS — Z20828 Contact with and (suspected) exposure to other viral communicable diseases: Secondary | ICD-10-CM | POA: Diagnosis not present

## 2018-11-08 DIAGNOSIS — Z08 Encounter for follow-up examination after completed treatment for malignant neoplasm: Secondary | ICD-10-CM | POA: Diagnosis not present

## 2018-11-08 DIAGNOSIS — Z85820 Personal history of malignant melanoma of skin: Secondary | ICD-10-CM | POA: Diagnosis not present

## 2018-11-08 DIAGNOSIS — D225 Melanocytic nevi of trunk: Secondary | ICD-10-CM | POA: Diagnosis not present

## 2018-11-08 DIAGNOSIS — Z1283 Encounter for screening for malignant neoplasm of skin: Secondary | ICD-10-CM | POA: Diagnosis not present

## 2018-11-13 ENCOUNTER — Encounter: Payer: Self-pay | Admitting: Internal Medicine

## 2018-12-04 ENCOUNTER — Encounter: Payer: Self-pay | Admitting: Internal Medicine

## 2019-01-24 ENCOUNTER — Encounter: Payer: Self-pay | Admitting: Internal Medicine

## 2019-01-31 DIAGNOSIS — Z08 Encounter for follow-up examination after completed treatment for malignant neoplasm: Secondary | ICD-10-CM | POA: Diagnosis not present

## 2019-01-31 DIAGNOSIS — X32XXXD Exposure to sunlight, subsequent encounter: Secondary | ICD-10-CM | POA: Diagnosis not present

## 2019-01-31 DIAGNOSIS — Z86006 Personal history of melanoma in-situ: Secondary | ICD-10-CM | POA: Diagnosis not present

## 2019-01-31 DIAGNOSIS — L821 Other seborrheic keratosis: Secondary | ICD-10-CM | POA: Diagnosis not present

## 2019-01-31 DIAGNOSIS — Z1283 Encounter for screening for malignant neoplasm of skin: Secondary | ICD-10-CM | POA: Diagnosis not present

## 2019-01-31 DIAGNOSIS — L570 Actinic keratosis: Secondary | ICD-10-CM | POA: Diagnosis not present

## 2019-03-12 ENCOUNTER — Telehealth: Payer: Self-pay | Admitting: Internal Medicine

## 2019-03-20 MED ORDER — LEVOTHYROXINE SODIUM 125 MCG PO TABS: 125.0000 ug | ORAL_TABLET | Freq: Every day | ORAL | 0 refills | Status: DC

## 2019-03-20 MED ORDER — PRAVASTATIN SODIUM 40 MG PO TABS: 40.0000 mg | ORAL_TABLET | Freq: Every day | ORAL | 0 refills | Status: DC

## 2019-03-20 NOTE — Addendum Note (Signed)
Addended by: Earnstine Regal on: 03/20/2019 01:45 PM   Modules accepted: Orders

## 2019-03-20 NOTE — Telephone Encounter (Signed)
Resent to [facility]../l;mb

## 2019-03-20 NOTE — Telephone Encounter (Signed)
Marjory Lies from Pharmacy called reporting that they accidentally deleted the Rx request, he asked if the office could please resend this.

## 2019-04-18 NOTE — Patient Instructions (Addendum)
Blood work was ordered.    All other Health Maintenance issues reviewed.   All recommended immunizations and age-appropriate screenings are up-to-date or discussed.  Pneumonia immunization administered today.   Medications reviewed and updated.  Changes include :   none  Your prescription(s) have been submitted to your pharmacy. Please take as directed and contact our office if you believe you are having problem(s) with the medication(s).    Please followup in 1 year    Health Maintenance, Male Adopting a healthy lifestyle and getting preventive care are important in promoting health and wellness. Ask your health care provider about:  The right schedule for you to have regular tests and exams.  Things you can do on your own to prevent diseases and keep yourself healthy. What should I know about diet, weight, and exercise? Eat a healthy diet   Eat a diet that includes plenty of vegetables, fruits, low-fat dairy products, and lean protein.  Do not eat a lot of foods that are high in solid fats, added sugars, or sodium. Maintain a healthy weight Body mass index (BMI) is a measurement that can be used to identify possible weight problems. It estimates body fat based on height and weight. Your health care provider can help determine your BMI and help you achieve or maintain a healthy weight. Get regular exercise Get regular exercise. This is one of the most important things you can do for your health. Most adults should:  Exercise for at least 150 minutes each week. The exercise should increase your heart rate and make you sweat (moderate-intensity exercise).  Do strengthening exercises at least twice a week. This is in addition to the moderate-intensity exercise.  Spend less time sitting. Even light physical activity can be beneficial. Watch cholesterol and blood lipids Have your blood tested for lipids and cholesterol at 67 years of age, then have this test every 5 years. You  may need to have your cholesterol levels checked more often if:  Your lipid or cholesterol levels are high.  You are older than 67 years of age.  You are at high risk for heart disease. What should I know about cancer screening? Many types of cancers can be detected early and may often be prevented. Depending on your health history and family history, you may need to have cancer screening at various ages. This may include screening for:  Colorectal cancer.  Prostate cancer.  Skin cancer.  Lung cancer. What should I know about heart disease, diabetes, and high blood pressure? Blood pressure and heart disease  High blood pressure causes heart disease and increases the risk of stroke. This is more likely to develop in people who have high blood pressure readings, are of African descent, or are overweight.  Talk with your health care provider about your target blood pressure readings.  Have your blood pressure checked: ? Every 3-5 years if you are 28-42 years of age. ? Every year if you are 26 years old or older.  If you are between the ages of 65 and 53 and are a current or former smoker, ask your health care provider if you should have a one-time screening for abdominal aortic aneurysm (AAA). Diabetes Have regular diabetes screenings. This checks your fasting blood sugar level. Have the screening done:  Once every three years after age 52 if you are at a normal weight and have a low risk for diabetes.  More often and at a younger age if you are overweight or have a  a high risk for diabetes. What should I know about preventing infection? Hepatitis B If you have a higher risk for hepatitis B, you should be screened for this virus. Talk with your health care provider to find out if you are at risk for hepatitis B infection. Hepatitis C Blood testing is recommended for:  Everyone born from 1943 through 1965.  Anyone with known risk factors for hepatitis C. Sexually transmitted  infections (STIs)  You should be screened each year for STIs, including gonorrhea and chlamydia, if: ? You are sexually active and are younger than 67 years of age. ? You are older than 67 years of age and your health care provider tells you that you are at risk for this type of infection. ? Your sexual activity has changed since you were last screened, and you are at increased risk for chlamydia or gonorrhea. Ask your health care provider if you are at risk.  Ask your health care provider about whether you are at high risk for HIV. Your health care provider may recommend a prescription medicine to help prevent HIV infection. If you choose to take medicine to prevent HIV, you should first get tested for HIV. You should then be tested every 3 months for as long as you are taking the medicine. Follow these instructions at home: Lifestyle  Do not use any products that contain nicotine or tobacco, such as cigarettes, e-cigarettes, and chewing tobacco. If you need help quitting, ask your health care provider.  Do not use street drugs.  Do not share needles.  Ask your health care provider for help if you need support or information about quitting drugs. Alcohol use  Do not drink alcohol if your health care provider tells you not to drink.  If you drink alcohol: ? Limit how much you have to 0-2 drinks a day. ? Be aware of how much alcohol is in your drink. In the [place] one drink equals one 12 oz bottle of beer (355 mL), one 5 oz glass of wine (148 mL), or one 1 oz glass of hard liquor (44 mL). General instructions  Schedule regular health, dental, and eye exams.  Stay current with your vaccines.  Tell your health care provider if: ? You often feel depressed. ? You have ever been abused or do not feel safe at home. Summary  Adopting a healthy lifestyle and getting preventive care are important in promoting health and wellness.  Follow your health care provider's instructions about  healthy diet, exercising, and getting tested or screened for diseases.  Follow your health care provider's instructions on monitoring your cholesterol and blood pressure. This information is not intended to replace advice given to you by your health care provider. Make sure you discuss any questions you have with your health care provider. Document Revised: 02/28/2018 Document Reviewed: 02/28/2018 Elsevier Patient Education  2020 [employer]  

## 2019-04-18 NOTE — Progress Notes (Signed)
Subjective:    Patient ID: [name] [name], male    DOB: 01/28/1951, 69 y.o.   MRN: [id number]  HPI He is here for a physical exam.   He was diagnosed with melanoma after our last visit and did have it removed.  He is following with derm regularly.  He has no major concerns.  Medications and allergies reviewed with patient and updated if appropriate.  Patient Active Problem List   Diagnosis Date Noted  . Fever 07/27/2018  . Melanoma of skin ([place]) 07/20/2018  . Change in mole 04/11/2018  . Numbness and tingling in right hand 03/20/2017  . Hyperglycemia 11/28/2014  . Tubular adenoma of colon 04/08/2013  . 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 ELEVATED BLOOD PRESSURE WITHOUT DIAGNOSIS OF HYPERTENSION 08/21/2009  . Hyperlipidemia 07/04/2007  . Hypothyroidism 06/19/2007    Current Outpatient Medications on File Prior to Visit  Medication Sig Dispense Refill  . aspirin EC 81 MG tablet Take 81 mg by mouth at bedtime.    Marland Kitchen levothyroxine (SYNTHROID) 125 MCG tablet Take 1 tablet (125 mcg total) by mouth daily. Annual appt due in January must see provider for future refills 30 tablet 0  . Multiple Vitamins-Minerals (CENTRUM SILVER 50+MEN PO) Take by mouth.    . pravastatin (PRAVACHOL) 40 MG tablet Take 1 tablet (40 mg total) by mouth daily. Annual appt due in January must see provider for future refills 30 tablet 0   No current facility-administered medications on file prior to visit.    Past Medical History:  Diagnosis Date  . Arthritis   . Hemorrhoids 2014  . Hyperlipidemia   . 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 Thyroid disease 2007   hypothyroidism    Past Surgical History:  Procedure Laterality Date  . COLONOSCOPY  2008   Dr Fuller Plan  . COLONOSCOPY W/ POLYPECTOMY  2014   Dr Fuller Plan  . POLYPECTOMY    . [facility] PLACEMENT  Dec 2012   REMOVED spring 2014  . TONSILLECTOMY      Social History    Socioeconomic History  . Marital status: Married    Spouse name: Not on file  . Number of children: 2  . Years of education: Not on file  . Highest education level: Not on file  Occupational History  . Occupation: Retired    Comment: Airline pilot  Tobacco Use  . Smoking status: Never Smoker  . Smokeless tobacco: Never Used  . Tobacco comment: NEVER USED TOBACCO  Substance and Sexual Activity  . Alcohol use: Yes    Alcohol/week: 0.0 standard drinks    Comment:  1-2 wine / week  . Drug use: No  . Sexual activity: Not on file  Other Topics Concern  . Not on file  Social History Narrative   Exercise:  No regular exercise, active, yardwork   Social Determinants of Health   Financial Resource Strain:   . Difficulty of Paying Living Expenses: Not on file  Food Insecurity:   . Worried About Charity fundraiser in the Last Year: Not on file  . Ran Out of Food in the Last Year: Not on file  Transportation Needs:   . Lack of Transportation (Medical): Not on file  . Lack of Transportation (Non-Medical): Not on file  Physical Activity:   . Days of Exercise per Week: Not on file  . Minutes of Exercise per Session: Not on file  Stress:   .  Feeling of Stress : Not on file  Social Connections:   . Frequency of Communication with Friends and Family: Not on file  . Frequency of Social Gatherings with Friends and Family: Not on file  . Attends Religious Services: Not on file  . Active Member of Clubs or Organizations: Not on file  . Attends Archivist Meetings: Not on file  . Marital Status: Not on file    Family History  Problem Relation Age of Onset  . Alzheimer's disease Mother   . Heart attack Father 14  . Diabetes Brother   . Hypertension Brother         X 3  . Heart disease Paternal Grandfather        in 34s  . Coronary artery disease Brother        CBAG  . Cancer Neg Hx   . Stroke Neg Hx   . Colon cancer Neg Hx   . Colon polyps Neg Hx   . Rectal cancer  Neg Hx   . Stomach cancer Neg Hx   . Esophageal cancer Neg Hx     Review of Systems  Constitutional: Positive for diaphoresis (night sweats on occasion). Negative for chills and fever.       Lower energy  Eyes: Negative for visual disturbance.  Respiratory: Negative for cough, shortness of breath and wheezing.   Cardiovascular: Negative for chest pain, palpitations and leg swelling.  Gastrointestinal: Positive for anal bleeding. Negative for abdominal pain, blood in stool, constipation, diarrhea and nausea.       GERD with certain foods  Genitourinary: Positive for frequency. Negative for difficulty urinating, dysuria and hematuria.  Musculoskeletal: Positive for arthralgias (foot, hand) and neck stiffness.  Skin: Negative for color change and rash.  Neurological: Negative for light-headedness and headaches.  Psychiatric/Behavioral: Negative for dysphoric mood. The patient is not nervous/anxious.        Objective:   Vitals:   04/19/19 0825  BP: 136/80  Pulse: 75  Resp: 16  Temp: 98.7 F (37.1 C)  SpO2: 96%   Filed Weights   04/19/19 0825  Weight: 212 lb 12.8 oz (96.5 kg)   Body mass index is 29.68 kg/m.  BP Readings from Last 3 Encounters:  04/19/19 136/80  04/11/18 (!) 144/88  02/05/18 (!) 142/74    Wt Readings from Last 3 Encounters:  04/19/19 212 lb 12.8 oz (96.5 kg)  04/11/18 216 lb (98 kg)  02/05/18 220 lb 12.8 oz (100.2 kg)     Physical Exam Constitutional: He appears well-developed and well-nourished. No distress.  HENT:  Head: Normocephalic and atraumatic.  Right Ear: External ear normal.  Left Ear: External ear normal.  Mouth/Throat: Oropharynx is clear and moist.  Normal ear canals and TM b/l  Eyes: Conjunctivae and EOM are normal.  Neck: Neck supple. No tracheal deviation present. No thyromegaly present.  No carotid bruit  Cardiovascular: Normal rate, regular rhythm, normal heart sounds and intact distal pulses.   No murmur  heard. Pulmonary/Chest: Effort normal and breath sounds normal. No respiratory distress. He has no wheezes. He has no rales.  Abdominal: Soft. He exhibits no distension. There is no tenderness.  Genitourinary: deferred  Musculoskeletal: He exhibits no edema.  Lymphadenopathy:   He has no cervical adenopathy.  Skin: Skin is warm and dry. He is not diaphoretic.  Psychiatric: He has a normal mood and affect. His behavior is normal.         Assessment & Plan:   Physical  exam: Screening blood work  ordered Immunizations  Pneumovax today, discussed covid Colonoscopy   Up to date  Eye exams   Up to date  Exercise   Not walking regularly Weight  Elevated- has lost weight Substance abuse   none  See Problem List for Assessment and Plan of chronic medical problems.    This visit occurred during the SARS-CoV-2 public health emergency.  Safety protocols were in place, including screening questions prior to the visit, additional usage of staff PPE, and extensive cleaning of exam room while observing appropriate contact time as indicated for disinfecting solutions.

## 2019-04-19 ENCOUNTER — Other Ambulatory Visit: Payer: Self-pay

## 2019-04-19 ENCOUNTER — Encounter: Payer: Self-pay | Admitting: Internal Medicine

## 2019-04-19 ENCOUNTER — Ambulatory Visit (INDEPENDENT_AMBULATORY_CARE_PROVIDER_SITE_OTHER): Payer: PPO | Admitting: Internal Medicine

## 2019-04-19 VITALS — BP 136/80 | HR 75 | Temp 98.7°F | Resp 16 | Ht 71.0 in | Wt 212.8 lb

## 2019-04-19 DIAGNOSIS — E7849 Other hyperlipidemia: Secondary | ICD-10-CM | POA: Diagnosis not present

## 2019-04-19 DIAGNOSIS — R200 Anesthesia of skin: Secondary | ICD-10-CM | POA: Diagnosis not present

## 2019-04-19 DIAGNOSIS — Z0000 Encounter for general adult medical examination without abnormal findings: Secondary | ICD-10-CM | POA: Diagnosis not present

## 2019-04-19 DIAGNOSIS — Z125 Encounter for screening for malignant neoplasm of prostate: Secondary | ICD-10-CM | POA: Diagnosis not present

## 2019-04-19 DIAGNOSIS — R739 Hyperglycemia, unspecified: Secondary | ICD-10-CM | POA: Diagnosis not present

## 2019-04-19 DIAGNOSIS — C439 Malignant melanoma of skin, unspecified: Secondary | ICD-10-CM | POA: Diagnosis not present

## 2019-04-19 DIAGNOSIS — R202 Paresthesia of skin: Secondary | ICD-10-CM

## 2019-04-19 DIAGNOSIS — E032 Hypothyroidism due to medicaments and other exogenous substances: Secondary | ICD-10-CM | POA: Diagnosis not present

## 2019-04-19 DIAGNOSIS — Z23 Encounter for immunization: Secondary | ICD-10-CM

## 2019-04-19 LAB — COMPREHENSIVE METABOLIC PANEL
ALT: 14 U/L (ref 0–53)
AST: 20 U/L (ref 0–37)
Albumin: 3.8 g/dL (ref 3.5–5.2)
Alkaline Phosphatase: 74 U/L (ref 39–117)
BUN: 22 mg/dL (ref 6–23)
CO2: 26 mEq/L (ref 19–32)
Calcium: 9.5 mg/dL (ref 8.4–10.5)
Chloride: 101 mEq/L (ref 96–112)
Creatinine, Ser: 1.05 mg/dL (ref 0.40–1.50)
GFR: 70.55 mL/min (ref >60.00)
Glucose, Bld: 81 mg/dL (ref 70–99)
Potassium: 4.4 mEq/L (ref 3.5–5.1)
Sodium: 135 mEq/L (ref 135–145)
Total Bilirubin: 0.5 mg/dL (ref 0.2–1.2)
Total Protein: 8.5 g/dL — ABNORMAL HIGH (ref 6.0–8.3)

## 2019-04-19 LAB — LIPID PANEL
Cholesterol: 136 mg/dL (ref 0–200)
HDL: 34.9 mg/dL — ABNORMAL LOW (ref >39.00)
LDL Cholesterol: 79 mg/dL (ref 0–99)
NonHDL: 100.95
Total CHOL/HDL Ratio: 4
Triglycerides: 109 mg/dL (ref 0.0–149.0)
VLDL: 21.8 mg/dL (ref 0.0–40.0)

## 2019-04-19 LAB — CBC WITH DIFFERENTIAL/PLATELET
Basophils Absolute: 0.1 10*3/uL (ref 0.0–0.1)
Basophils Relative: 0.5 % (ref 0.0–3.0)
Eosinophils Absolute: 0.2 10*3/uL (ref 0.0–0.7)
Eosinophils Relative: 1.2 % (ref 0.0–5.0)
HCT: 35.6 % — ABNORMAL LOW (ref 39.0–52.0)
Hemoglobin: 12 g/dL — ABNORMAL LOW (ref 13.0–17.0)
Lymphocytes Relative: 77 % — ABNORMAL HIGH (ref 12.0–46.0)
Lymphs Abs: 10.4 10*3/uL — ABNORMAL HIGH (ref 0.7–4.0)
MCHC: 33.6 g/dL (ref 30.0–36.0)
MCV: 86.6 fl (ref 78.0–100.0)
Monocytes Absolute: 0 10*3/uL — ABNORMAL LOW (ref 0.1–1.0)
Monocytes Relative: 0.3 % — ABNORMAL LOW (ref 3.0–12.0)
Neutro Abs: 2.8 10*3/uL (ref 1.4–7.7)
Neutrophils Relative %: 21 % — ABNORMAL LOW (ref 43.0–77.0)
Platelets: 155 10*3/uL (ref 150.0–400.0)
RBC: 4.11 Mil/uL — ABNORMAL LOW (ref 4.22–5.81)
RDW: 13.8 % (ref 11.5–15.5)
WBC: 13.5 10*3/uL — ABNORMAL HIGH (ref 4.0–10.5)

## 2019-04-19 LAB — HEMOGLOBIN A1C: Hgb A1c MFr Bld: 5.7 % (ref 4.6–6.5)

## 2019-04-19 LAB — TSH: TSH: 5.13 u[IU]/mL — ABNORMAL HIGH (ref 0.35–4.50)

## 2019-04-19 LAB — PSA, MEDICARE: PSA: 1.7 ng/ml (ref 0.10–4.00)

## 2019-04-19 NOTE — Assessment & Plan Note (Signed)
Chronic Check a1c Low sugar / carb diet Stressed regular exercise  

## 2019-04-19 NOTE — Assessment & Plan Note (Signed)
Following with derm regularly

## 2019-04-19 NOTE — Assessment & Plan Note (Signed)
Persistent No weakness  Likely CTS Deferred referral at this time, will monitor

## 2019-04-19 NOTE — Assessment & Plan Note (Signed)
Chronic  Clinically euthyroid Check tsh  Titrate med dose if needed  

## 2019-04-19 NOTE — Addendum Note (Signed)
Addended by: Delice Bison E on: 04/19/2019 12:40 PM   Modules accepted: Orders

## 2019-04-19 NOTE — Assessment & Plan Note (Signed)
Chronic Check lipid panel, cmp  Continue daily statin Regular exercise and healthy diet encouraged

## 2019-04-21 ENCOUNTER — Encounter: Payer: Self-pay | Admitting: Internal Medicine

## 2019-04-21 ENCOUNTER — Other Ambulatory Visit: Payer: Self-pay | Admitting: Internal Medicine

## 2019-04-21 DIAGNOSIS — E032 Hypothyroidism due to medicaments and other exogenous substances: Secondary | ICD-10-CM

## 2019-04-21 DIAGNOSIS — D72829 Elevated white blood cell count, unspecified: Secondary | ICD-10-CM

## 2019-04-24 ENCOUNTER — Encounter: Payer: Self-pay | Admitting: Internal Medicine

## 2019-04-24 ENCOUNTER — Other Ambulatory Visit (INDEPENDENT_AMBULATORY_CARE_PROVIDER_SITE_OTHER): Payer: PPO

## 2019-04-24 ENCOUNTER — Other Ambulatory Visit: Payer: Self-pay | Admitting: Internal Medicine

## 2019-04-24 ENCOUNTER — Other Ambulatory Visit: Payer: Self-pay

## 2019-04-24 DIAGNOSIS — Z8579 Personal history of other malignant neoplasms of lymphoid, hematopoietic and related tissues: Secondary | ICD-10-CM | POA: Diagnosis not present

## 2019-04-24 DIAGNOSIS — E032 Hypothyroidism due to medicaments and other exogenous substances: Secondary | ICD-10-CM | POA: Diagnosis not present

## 2019-04-24 DIAGNOSIS — D72829 Elevated white blood cell count, unspecified: Secondary | ICD-10-CM | POA: Diagnosis not present

## 2019-04-24 DIAGNOSIS — R5383 Other fatigue: Secondary | ICD-10-CM

## 2019-04-24 DIAGNOSIS — R61 Generalized hyperhidrosis: Secondary | ICD-10-CM

## 2019-04-24 LAB — CBC WITH DIFFERENTIAL/PLATELET
Basophils Absolute: 0.1 10*3/uL (ref 0.0–0.1)
Basophils Relative: 0.6 % (ref 0.0–3.0)
Eosinophils Absolute: 0.2 10*3/uL (ref 0.0–0.7)
Eosinophils Relative: 1.6 % (ref 0.0–5.0)
HCT: 35.7 % — ABNORMAL LOW (ref 39.0–52.0)
Hemoglobin: 11.8 g/dL — ABNORMAL LOW (ref 13.0–17.0)
Lymphocytes Relative: 46.2 % — ABNORMAL HIGH (ref 12.0–46.0)
Lymphs Abs: 7.1 10*3/uL — ABNORMAL HIGH (ref 0.7–4.0)
MCHC: 33.1 g/dL (ref 30.0–36.0)
MCV: 87.2 fl (ref 78.0–100.0)
Monocytes Absolute: 4.6 10*3/uL — ABNORMAL HIGH (ref 0.1–1.0)
Monocytes Relative: 29.7 % — ABNORMAL HIGH (ref 3.0–12.0)
Neutro Abs: 3.4 10*3/uL (ref 1.4–7.7)
Neutrophils Relative %: 21.9 % — ABNORMAL LOW (ref 43.0–77.0)
Platelets: 171 10*3/uL (ref 150.0–400.0)
RBC: 4.09 Mil/uL — ABNORMAL LOW (ref 4.22–5.81)
RDW: 13.9 % (ref 11.5–15.5)
WBC: 15.4 10*3/uL — ABNORMAL HIGH (ref 4.0–10.5)

## 2019-04-24 LAB — TSH: TSH: 5.34 u[IU]/mL — ABNORMAL HIGH (ref 0.35–4.50)

## 2019-04-24 MED ORDER — LEVOTHYROXINE SODIUM 137 MCG PO TABS: 137.0000 ug | ORAL_TABLET | Freq: Every day | ORAL | 1 refills | Status: DC

## 2019-04-24 NOTE — Addendum Note (Signed)
Addended by: Delice Bison E on: 04/24/2019 08:49 AM   Modules accepted: Orders

## 2019-04-25 LAB — LACTATE DEHYDROGENASE: LDH: 163 U/L (ref 120–250)

## 2019-04-29 ENCOUNTER — Encounter: Payer: Self-pay | Admitting: Hematology & Oncology

## 2019-05-09 ENCOUNTER — Encounter: Payer: Self-pay | Admitting: Hematology & Oncology

## 2019-05-10 ENCOUNTER — Other Ambulatory Visit: Payer: Self-pay | Admitting: *Deleted

## 2019-05-10 DIAGNOSIS — C8583 Other specified types of non-Hodgkin lymphoma, intra-abdominal lymph nodes: Secondary | ICD-10-CM

## 2019-05-10 DIAGNOSIS — C439 Malignant melanoma of skin, unspecified: Secondary | ICD-10-CM

## 2019-05-11 ENCOUNTER — Ambulatory Visit: Payer: PPO | Attending: Internal Medicine

## 2019-05-11 DIAGNOSIS — Z23 Encounter for immunization: Secondary | ICD-10-CM

## 2019-05-11 NOTE — Progress Notes (Signed)
   Covid-19 Vaccination Clinic  Name:  [name] [name]    MRN: [id number] DOB: 1950-10-07  05/11/2019  [name] was observed post Covid-19 immunization for 15 minutes without incidence. He was provided with Vaccine Information Sheet and instruction to access the V-Safe system.   [name] was instructed to call 911 with any severe reactions post vaccine: Marland Kitchen Difficulty breathing  . Swelling of your face and throat  . A fast heartbeat  . A bad rash all over your body  . Dizziness and weakness    Immunizations Administered    Name Date Dose VIS Date Route   Pfizer COVID-19 Vaccine 05/11/2019  8:40 AM 0.3 mL 03/01/2019 Intramuscular   Manufacturer: [place]   Lot: [id number]   [place]: [id number]

## 2019-05-13 ENCOUNTER — Inpatient Hospital Stay: Payer: PPO | Attending: Hematology & Oncology

## 2019-05-13 ENCOUNTER — Encounter: Payer: Self-pay | Admitting: Hematology & Oncology

## 2019-05-13 ENCOUNTER — Inpatient Hospital Stay (HOSPITAL_BASED_OUTPATIENT_CLINIC_OR_DEPARTMENT_OTHER): Payer: PPO | Admitting: Hematology & Oncology

## 2019-05-13 ENCOUNTER — Other Ambulatory Visit: Payer: Self-pay

## 2019-05-13 VITALS — BP 185/88 | HR 89 | Temp 97.3°F | Wt 210.0 lb

## 2019-05-13 DIAGNOSIS — R5383 Other fatigue: Secondary | ICD-10-CM | POA: Diagnosis not present

## 2019-05-13 DIAGNOSIS — C8583 Other specified types of non-Hodgkin lymphoma, intra-abdominal lymph nodes: Secondary | ICD-10-CM

## 2019-05-13 DIAGNOSIS — R110 Nausea: Secondary | ICD-10-CM | POA: Insufficient documentation

## 2019-05-13 DIAGNOSIS — D72829 Elevated white blood cell count, unspecified: Secondary | ICD-10-CM | POA: Diagnosis not present

## 2019-05-13 DIAGNOSIS — Z8572 Personal history of non-Hodgkin lymphomas: Secondary | ICD-10-CM | POA: Insufficient documentation

## 2019-05-13 DIAGNOSIS — C439 Malignant melanoma of skin, unspecified: Secondary | ICD-10-CM

## 2019-05-13 LAB — CMP (CANCER CENTER ONLY)
ALT: 11 U/L (ref 0–44)
AST: 17 U/L (ref 15–41)
Albumin: 3.8 g/dL (ref 3.5–5.0)
Alkaline Phosphatase: 77 U/L (ref 38–126)
Anion gap: 8 (ref 5–15)
BUN: 25 mg/dL — ABNORMAL HIGH (ref 8–23)
CO2: 27 mmol/L (ref 22–32)
Calcium: 10.4 mg/dL — ABNORMAL HIGH (ref 8.9–10.3)
Chloride: 102 mmol/L (ref 98–111)
Creatinine: 1.16 mg/dL (ref 0.61–1.24)
GFR, Est AFR Am: >60 mL/min (ref >60)
GFR, Estimated: >60 mL/min (ref >60)
Glucose, Bld: 125 mg/dL — ABNORMAL HIGH (ref 70–99)
Potassium: 4.2 mmol/L (ref 3.5–5.1)
Sodium: 137 mmol/L (ref 135–145)
Total Bilirubin: 0.3 mg/dL (ref 0.3–1.2)
Total Protein: 9.1 g/dL — ABNORMAL HIGH (ref 6.5–8.1)

## 2019-05-13 LAB — CBC WITH DIFFERENTIAL (CANCER CENTER ONLY)
Abs Immature Granulocytes: 0.02 10*3/uL (ref 0.00–0.07)
Basophils Absolute: 0.1 10*3/uL (ref 0.0–0.1)
Basophils Relative: 1 %
Eosinophils Absolute: 0.2 10*3/uL (ref 0.0–0.5)
Eosinophils Relative: 1 %
HCT: 35.6 % — ABNORMAL LOW (ref 39.0–52.0)
Hemoglobin: 11.6 g/dL — ABNORMAL LOW (ref 13.0–17.0)
Immature Granulocytes: 0 %
Lymphocytes Relative: 67 %
Lymphs Abs: 10.2 10*3/uL — ABNORMAL HIGH (ref 0.7–4.0)
MCH: 28.8 pg (ref 26.0–34.0)
MCHC: 32.6 g/dL (ref 30.0–36.0)
MCV: 88.3 fL (ref 80.0–100.0)
Monocytes Absolute: 1.4 10*3/uL — ABNORMAL HIGH (ref 0.1–1.0)
Monocytes Relative: 9 %
Neutro Abs: 3.3 10*3/uL (ref 1.7–7.7)
Neutrophils Relative %: 22 %
Platelet Count: 168 10*3/uL (ref 150–400)
RBC: 4.03 MIL/uL — ABNORMAL LOW (ref 4.22–5.81)
RDW: 12.9 % (ref 11.5–15.5)
WBC Count: 15.1 10*3/uL — ABNORMAL HIGH (ref 4.0–10.5)
nRBC: 0 % (ref 0.0–0.2)

## 2019-05-13 NOTE — Progress Notes (Signed)
Hematology and Oncology Follow Up Visit  [name] [name] [id number] 1951-01-04 69 y.o. 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  We last saw him back in February 2018.  He had been doing well.  Recently, he had some blood work done by his family doctor which showed an increase in white blood cells.  These were mostly lymphocytes.  His family doctor, Dr. Quay [name], thought that he needed to come back to see [place] as she was worried about the possibility of recurrent disease.  He is felt a little bit of nausea.  He has had no vomiting.  Has had no obvious change in bowel or bladder habits.  He has had a little bit of fatigue.  He has had no fever.  There is been no bleeding.  He has had no leg swelling.  There is no rashes.  He has had no itching.  This would no change in medications.  Since we saw him 3 years ago, he has lost about 25 pounds.  Some of his weight loss is intentional.  Overall, his performance status is ECOG 0.  Medications:  Current Outpatient Medications:  .  aspirin EC 81 MG tablet, Take 81 mg by mouth at bedtime., Disp: , Rfl:  .  levothyroxine (SYNTHROID) 137 MCG tablet, Take 1 tablet (137 mcg total) by mouth daily before breakfast., Disp: 30 tablet, Rfl: 1 .  Multiple Vitamins-Minerals (CENTRUM SILVER 50+MEN PO), Take by mouth., Disp: , Rfl:  .  pravastatin (PRAVACHOL) 40 MG tablet, Take 1 tablet (40 mg total) by mouth daily. Annual appt due in January must see provider for future refills, Disp: 30 tablet, Rfl: 0  Allergies: No Known Allergies  Past Medical History, Surgical history, Social history, and Family History were reviewed and updated.  Review of Systems: Review of Systems  Constitutional: Negative.   HENT:  Negative.   Eyes: Negative.   Respiratory: Negative.   Cardiovascular: Negative.     Gastrointestinal: Positive for abdominal pain and nausea.  Endocrine: Negative.   Genitourinary: Negative.    Musculoskeletal: Negative.   Skin: Negative.   Neurological: Negative.   Hematological: Negative.   Psychiatric/Behavioral: Negative.     Physical Exam:  weight is 210 lb (95.3 kg). His oral temperature is 97.3 F (36.3 C) (abnormal). His blood pressure is 185/88 (abnormal) and his pulse is 89. His oxygen saturation is 100%.   Wt Readings from Last 3 Encounters:  05/13/19 210 lb (95.3 kg)  04/19/19 212 lb 12.8 oz (96.5 kg)  04/11/18 216 lb (98 kg)    Physical Exam Vitals reviewed.  HENT:     Head: Normocephalic and atraumatic.  Eyes:     Pupils: Pupils are equal, round, and reactive to light.  Cardiovascular:     Rate and Rhythm: Normal rate and regular rhythm.     Heart sounds: Normal heart sounds.  Pulmonary:     Effort: Pulmonary effort is normal.     Breath sounds: Normal breath sounds.  Abdominal:     General: Bowel sounds are normal.     Palpations: Abdomen is soft.  Musculoskeletal:        General: No tenderness or deformity. Normal range of motion.     Cervical back: Normal range of motion.  Lymphadenopathy:     Cervical: No cervical adenopathy.  Skin:    General: Skin is warm and dry.     Findings: No erythema or rash.  Neurological:     Mental Status: He is alert and oriented to person, place, and time.  Psychiatric:        Behavior: Behavior normal.        Thought Content: Thought content normal.        Judgment: Judgment normal.      Lab Results  Component Value Date   WBC 15.1 (H) 05/13/2019   HGB 11.6 (L) 05/13/2019   HCT 35.6 (L) 05/13/2019   MCV 88.3 05/13/2019   PLT 168 05/13/2019     Chemistry      Component Value Date/Time   NA 137 05/13/2019 1428   NA 142 04/25/2016 0844   NA 140 10/19/2015 0903   K 4.2 05/13/2019 1428   K 4.1 04/25/2016 0844   K 4.1 10/19/2015 0903   CL 102 05/13/2019 1428   CL 102 04/25/2016 0844    CO2 27 05/13/2019 1428   CO2 29 04/25/2016 0844   CO2 25 10/19/2015 0903   BUN 25 (H) 05/13/2019 1428   BUN 14 04/25/2016 0844   BUN 21.6 10/19/2015 0903   CREATININE 1.16 05/13/2019 1428   CREATININE 1.1 04/25/2016 0844   CREATININE 1.3 10/19/2015 0903      Component Value Date/Time   CALCIUM 10.4 (H) 05/13/2019 1428   CALCIUM 9.6 04/25/2016 0844   CALCIUM 9.4 10/19/2015 0903   ALKPHOS 77 05/13/2019 1428   ALKPHOS 59 04/25/2016 0844   ALKPHOS 68 10/19/2015 0903   AST 17 05/13/2019 1428   AST 21 10/19/2015 0903   ALT 11 05/13/2019 1428   ALT 27 04/25/2016 0844   ALT 19 10/19/2015 0903   BILITOT 0.3 05/13/2019 1428   BILITOT 0.54 10/19/2015 0903      Impression and Plan: [name] [name] is a 69 year old white male.  We treated him 8 years ago.  He had a marginal zone lymphoma.  He went into clinical remission.  When he initially presented, he had splenomegaly.  This resolved.  I suspect that by his blood smear at least, that his lymphoma is back.  I noted the trend with his white blood cell count.  Also noted little bit of a trend with his red cells.  I am sending off his peripheral blood for flow cytometry to see if he has a monoclonal population of lymphocytes.  We will get a CT of his body, including his neck.  I think this will definitely tell us what is going on.  It is possible we might have to consider a lymph node biopsy if one was be available.  It is possible that he may have some kind of transformed lymphoma to a more higher grade.  Hopefully, we will not have to do a bone marrow biopsy on him.  We will see what all the results show from his studies.  We will then get him back so we can figure out how we can proceed.  If he does have recurrent disease, we might be able just put him on a pill with one of the new targeted drugs that we have.  I did spend about 45 minutes with him.   [name], MD 2/22/20213:31 PM

## 2019-05-14 ENCOUNTER — Encounter (HOSPITAL_BASED_OUTPATIENT_CLINIC_OR_DEPARTMENT_OTHER): Payer: Self-pay

## 2019-05-14 ENCOUNTER — Ambulatory Visit (HOSPITAL_BASED_OUTPATIENT_CLINIC_OR_DEPARTMENT_OTHER)
Admission: RE | Admit: 2019-05-14 | Discharge: 2019-05-14 | Disposition: A | Discharge status: Home / Self Care | Payer: PPO | Source: Ambulatory Visit | Attending: Hematology & Oncology | Admitting: Hematology & Oncology

## 2019-05-14 DIAGNOSIS — C8583 Other specified types of non-Hodgkin lymphoma, intra-abdominal lymph nodes: Secondary | ICD-10-CM | POA: Insufficient documentation

## 2019-05-14 DIAGNOSIS — C8590 Non-Hodgkin lymphoma, unspecified, unspecified site: Secondary | ICD-10-CM | POA: Diagnosis not present

## 2019-05-14 LAB — SURGICAL PATHOLOGY

## 2019-05-14 MED ORDER — IOHEXOL 300 MG/ML  SOLN
100.0000 mL | Freq: Once | INTRAMUSCULAR | Status: AC | PRN
  Administered 2019-05-14: 100 mL via INTRAVENOUS

## 2019-05-16 ENCOUNTER — Other Ambulatory Visit: Payer: Self-pay | Admitting: *Deleted

## 2019-05-16 LAB — FLOW CYTOMETRY

## 2019-05-20 ENCOUNTER — Telehealth: Payer: Self-pay | Admitting: Pharmacy Technician

## 2019-05-20 ENCOUNTER — Inpatient Hospital Stay: Payer: PPO | Attending: Hematology & Oncology | Admitting: Hematology & Oncology

## 2019-05-20 ENCOUNTER — Inpatient Hospital Stay: Payer: PPO

## 2019-05-20 ENCOUNTER — Encounter: Payer: Self-pay | Admitting: Hematology & Oncology

## 2019-05-20 ENCOUNTER — Other Ambulatory Visit: Payer: Self-pay | Admitting: Internal Medicine

## 2019-05-20 ENCOUNTER — Other Ambulatory Visit: Payer: Self-pay

## 2019-05-20 VITALS — BP 167/95 | HR 82 | Temp 97.3°F | Resp 16 | Wt 205.0 lb

## 2019-05-20 DIAGNOSIS — C8583 Other specified types of non-Hodgkin lymphoma, intra-abdominal lymph nodes: Secondary | ICD-10-CM | POA: Diagnosis not present

## 2019-05-20 DIAGNOSIS — Z9221 Personal history of antineoplastic chemotherapy: Secondary | ICD-10-CM | POA: Insufficient documentation

## 2019-05-20 DIAGNOSIS — C8308 Small cell B-cell lymphoma, lymph nodes of multiple sites: Secondary | ICD-10-CM | POA: Diagnosis not present

## 2019-05-20 DIAGNOSIS — Z79899 Other long term (current) drug therapy: Secondary | ICD-10-CM | POA: Insufficient documentation

## 2019-05-20 DIAGNOSIS — R161 Splenomegaly, not elsewhere classified: Secondary | ICD-10-CM | POA: Insufficient documentation

## 2019-05-20 MED ORDER — IMBRUVICA 560 MG PO TABS: 560.0000 mg | ORAL_TABLET | Freq: Every day | ORAL | 6 refills | Status: DC

## 2019-05-20 NOTE — Telephone Encounter (Signed)
Oral Oncology Patient Advocate Encounter   Received notification from [employer] that prior authorization for Imbruvica is required.   PA submitted on CoverMyMeds Key [id number] Status is pending   Oral Oncology Clinic will continue to follow.  [place] Patient [place] Phone [phone] Fax [phone] 05/21/2019 8:35 AM

## 2019-05-20 NOTE — Progress Notes (Signed)
Hematology and Oncology Follow Up Visit  [name] [name] [id number] 1950/11/30 69 y.o. 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 is back for follow-up.  Unfortunately, looks like we do have recurrence of his marginal zone lymphoma.  We did a flow cytometry on his peripheral blood.  This was done on 05/13/2019.  The pathology report ([id number]) showed a monoclonal B-cell population that was consistent with his marginal zone lymphoma.  We then did CT scans of his neck down to his pelvis.  He did have splenomegaly.  His spleen size measured 890 cm.  He has some mildly enlarged mediastinal lymph nodes.  There is some slightly enlarged upper abdominal lymph nodes.  He still feels tired.  He is little bit anemic.  I suspect this probably is from his splenomegaly.  Of note, his LDH has not been elevated.  There is been no swollen lymph nodes that he can feel.  He comes in with his wife.  I really think that a good idea for treatment with him is going to be Imbruvica.  I think that single agent Imbruvica at 560 mg daily would be a good idea.  We will get an EKG on him today as a baseline.  He has had no fever.  There is been no change in bowel or bladder habits.  He has had no rashes.  Overall, his performance status is ECOG 1.  Medications:  Current Outpatient Medications:  .  aspirin EC 81 MG tablet, Take 81 mg by mouth at bedtime., Disp: , Rfl:  .  Ibrutinib (IMBRUVICA) 560 MG TABS, Take 560 mg by mouth daily., Disp: 30 tablet, Rfl: 6 .  levothyroxine (SYNTHROID) 137 MCG tablet, Take 1 tablet (137 mcg total) by mouth daily before breakfast., Disp: 30 tablet, Rfl: 1 .  Multiple Vitamins-Minerals (CENTRUM SILVER 50+MEN PO), Take by mouth., Disp: , Rfl:  .  pravastatin (PRAVACHOL) 40 MG tablet, Take 1 tablet (40 mg total)  by mouth daily. Annual appt due in January must see provider for future refills, Disp: 30 tablet, Rfl: 0  Allergies: No Known Allergies  Past Medical History, Surgical history, Social history, and Family History were reviewed and updated.  Review of Systems: Review of Systems  Constitutional: Negative.   HENT:  Negative.   Eyes: Negative.   Respiratory: Negative.   Cardiovascular: Negative.   Gastrointestinal: Positive for abdominal pain and nausea.  Endocrine: Negative.   Genitourinary: Negative.    Musculoskeletal: Negative.   Skin: Negative.   Neurological: Negative.   Hematological: Negative.   Psychiatric/Behavioral: Negative.     Physical Exam:  weight is 205 lb (93 kg). His temporal temperature is 97.3 F (36.3 C) (abnormal). His blood pressure is 167/95 (abnormal) and his pulse is 82. His respiration is 16.   Wt Readings from Last 3 Encounters:  05/20/19 205 lb (93 kg)  05/13/19 210 lb (95.3 kg)  04/19/19 212 lb 12.8 oz (96.5 kg)    Physical Exam Vitals reviewed.  HENT:     Head: Normocephalic and atraumatic.  Eyes:     Pupils: Pupils are equal, round, and reactive to light.  Cardiovascular:     Rate and Rhythm: Normal rate and regular rhythm.     Heart sounds: Normal heart sounds.  Pulmonary:     Effort:  Pulmonary effort is normal.     Breath sounds: Normal breath sounds.  Abdominal:     General: Bowel sounds are normal.     Palpations: Abdomen is soft.  Musculoskeletal:        General: No tenderness or deformity. Normal range of motion.     Cervical back: Normal range of motion.  Lymphadenopathy:     Cervical: No cervical adenopathy.  Skin:    General: Skin is warm and dry.     Findings: No erythema or rash.  Neurological:     Mental Status: He is alert and oriented to person, place, and time.  Psychiatric:        Behavior: Behavior normal.        Thought Content: Thought content normal.        Judgment: Judgment normal.      Lab Results    Component Value Date   WBC 15.1 (H) 05/13/2019   HGB 11.6 (L) 05/13/2019   HCT 35.6 (L) 05/13/2019   MCV 88.3 05/13/2019   PLT 168 05/13/2019     Chemistry      Component Value Date/Time   NA 137 05/13/2019 1428   NA 142 04/25/2016 0844   NA 140 10/19/2015 0903   K 4.2 05/13/2019 1428   K 4.1 04/25/2016 0844   K 4.1 10/19/2015 0903   CL 102 05/13/2019 1428   CL 102 04/25/2016 0844   CO2 27 05/13/2019 1428   CO2 29 04/25/2016 0844   CO2 25 10/19/2015 0903   BUN 25 (H) 05/13/2019 1428   BUN 14 04/25/2016 0844   BUN 21.6 10/19/2015 0903   CREATININE 1.16 05/13/2019 1428   CREATININE 1.1 04/25/2016 0844   CREATININE 1.3 10/19/2015 0903      Component Value Date/Time   CALCIUM 10.4 (H) 05/13/2019 1428   CALCIUM 9.6 04/25/2016 0844   CALCIUM 9.4 10/19/2015 0903   ALKPHOS 77 05/13/2019 1428   ALKPHOS 59 04/25/2016 0844   ALKPHOS 68 10/19/2015 0903   AST 17 05/13/2019 1428   AST 21 10/19/2015 0903   ALT 11 05/13/2019 1428   ALT 27 04/25/2016 0844   ALT 19 10/19/2015 0903   BILITOT 0.3 05/13/2019 1428   BILITOT 0.54 10/19/2015 0903      Impression and Plan: [name] is a 69 year old white male.  We treated him 8 years ago.  He had a marginal zone lymphoma.  He went into clinical remission.  When he initially presented, he had splenomegaly.  This resolved.  I must say that this recurrence is how he presented initially.  He had some lymphadenopathy but he had splenomegaly which really was the key.  Again I think that [name] will be a very good idea for him.  I think he will respond to [place].  I probably would consider full dose Imbruvica for a good 6 months.  Then hopefully if we see a response, the Imbruvica dose will be decreased gradually and then we can get him on a maintenance dose.  If, for some reason, there are any problems, then we probably will have to consider chemotherapy with likely Gazyva and bendamustine.    I did spend about 45 minutes with  him.   [name], MD 3/1/20212:41 PM

## 2019-05-21 ENCOUNTER — Telehealth: Payer: Self-pay | Admitting: Pharmacist

## 2019-05-21 NOTE — Telephone Encounter (Signed)
Oral Oncology Pharmacist Encounter  Received new prescription for Imbruvica (ibrutinib) for the treatment of relapsed marginal zone lymphoma, planned duration until disease progression or unacceptable drug toxicity.  CMP from 05/13/19 assessed, no relevant lab abnormalities. Prescription dose and frequency assessed.   Current medication list in Epic reviewed, no relevant DDIs with ibrutinib identified.  Prescription has been e-scribed to the [facility] for benefits analysis and approval.  Oral Oncology Clinic will continue to follow for insurance authorization, copayment issues, initial counseling and start date.  Darl Pikes, PharmD, BCPS, [facility] Hematology/Oncology Clinical Pharmacist [facility] [place] [facility] [phone]  05/21/2019 4:55 PM

## 2019-05-21 NOTE — Telephone Encounter (Signed)
Oral Oncology Patient Advocate Encounter  Prior Authorization for [name] [name] has been approved.    PA# [id number]  Effective dates: 05/21/2019 through 05/20/2020  Patients co-pay is $2910.33.  Oral Oncology Clinic will continue to follow.   [place] Patient [place] Phone [phone] Fax [phone] 05/21/2019 9:30 AM

## 2019-05-22 ENCOUNTER — Telehealth: Payer: Self-pay | Admitting: Pharmacy Technician

## 2019-05-22 MED FILL — IMBRUVICA 560 MG TAB: 560 | 28 days supply | Qty: 28 | Fill #0

## 2019-05-22 NOTE — Telephone Encounter (Signed)
Oral Oncology Patient Advocate Encounter  Coordinated with patient to complete application for [place] ([employer]) in an effort to reduce patient's out of pocket expense for Imbruvica to $0.    Application completed and faxed to [phone].   [employer] phone number for follow up is [phone].   This encounter will be updated until final determination.   [place] Patient [place] Phone [phone] Fax [phone] 05/27/2019 11:18 AM

## 2019-05-22 NOTE — Telephone Encounter (Signed)
Oral Chemotherapy Pharmacist Encounter  Patient plans on picking up his Imbruvica from [place] this afternoon 05/22/19.  Patient Education I spoke with patient for overview of new oral chemotherapy medication: Imbruvica (ibrutinib) for the treatment of relapsed marginal zone lymphoma, planned duration until disease progression or unacceptable drug toxicity.   Pt is doing well. Counseled patient on administration, dosing, side effects, monitoring, drug-food interactions, safe handling, storage, and disposal. Patient will take 560 mg by mouth daily.  Side effects include but not limited to: decreased wbc, fatigue, N/V, edema, diarrhea.    Reviewed with patient importance of keeping a medication schedule and plan for any missed doses.  [name] voiced understanding and appreciation. All questions answered. Medication handout placed in the mail.  Provided patient with [facility] [place] [facility] phone number. Patient knows to call the office with questions or concerns. Oral Chemotherapy Navigation Clinic will continue to follow.  Darl Pikes, PharmD, BCPS, [facility] Hematology/Oncology Clinical Pharmacist [facility] Oral [place] [facility] [phone]  05/22/2019 10:42 AM

## 2019-05-23 ENCOUNTER — Other Ambulatory Visit: Payer: PPO

## 2019-05-23 ENCOUNTER — Ambulatory Visit: Payer: PPO | Admitting: Hematology & Oncology

## 2019-05-24 NOTE — Telephone Encounter (Signed)
Patient picked up 28 day free trial of Imbruvica from [facility].  Applying for patient assistance since patients income is too high for grant assistance.  [place] Patient [place] Phone [phone] Fax [phone] 05/24/2019 11:17 AM

## 2019-05-30 NOTE — Telephone Encounter (Addendum)
Called [employer] to check status of application.  Application is being processed.    I will continue to follow up on application until final determination.  [place] Patient [place] Phone [phone] Fax [phone] 05/30/2019 12:20 PM

## 2019-06-04 ENCOUNTER — Ambulatory Visit: Payer: PPO | Attending: Internal Medicine

## 2019-06-04 DIAGNOSIS — Z23 Encounter for immunization: Secondary | ICD-10-CM

## 2019-06-04 NOTE — Progress Notes (Signed)
   Covid-19 Vaccination Clinic  Name:  [name] [name]    MRN: [id number] DOB: 03-Sep-1950  06/04/2019  [name] was observed post Covid-19 immunization for 30 minutes based on pre-vaccination screening without incident. He was provided with Vaccine Information Sheet and instruction to access the V-Safe system.   [name] was instructed to call 911 with any severe reactions post vaccine: Marland Kitchen Difficulty breathing  . Swelling of face and throat  . A fast heartbeat  . A bad rash all over body  . Dizziness and weakness   Immunizations Administered    Name Date Dose VIS Date Route   Pfizer COVID-19 Vaccine 06/04/2019 12:07 PM 0.3 mL 03/01/2019 Intramuscular   Manufacturer: [place]   Lot: [id number]   Reading: [address]-4

## 2019-06-05 ENCOUNTER — Other Ambulatory Visit: Payer: Self-pay

## 2019-06-05 ENCOUNTER — Other Ambulatory Visit (INDEPENDENT_AMBULATORY_CARE_PROVIDER_SITE_OTHER): Payer: PPO

## 2019-06-05 ENCOUNTER — Other Ambulatory Visit: Payer: PPO

## 2019-06-05 ENCOUNTER — Encounter: Payer: Self-pay | Admitting: Internal Medicine

## 2019-06-05 DIAGNOSIS — E032 Hypothyroidism due to medicaments and other exogenous substances: Secondary | ICD-10-CM | POA: Diagnosis not present

## 2019-06-05 LAB — TSH: TSH: 6.33 u[IU]/mL — ABNORMAL HIGH (ref 0.35–4.50)

## 2019-06-06 MED ORDER — LEVOTHYROXINE SODIUM 150 MCG PO TABS: 150.0000 ug | ORAL_TABLET | Freq: Every day | ORAL | 1 refills | Status: DC

## 2019-06-06 NOTE — Addendum Note (Signed)
Addended by: Delice Bison E on: 06/06/2019 11:57 AM   Modules accepted: Orders

## 2019-06-06 NOTE — Telephone Encounter (Signed)
Please return call to patient to discuss lab results and recommendation

## 2019-06-07 ENCOUNTER — Other Ambulatory Visit: Payer: Self-pay

## 2019-06-07 ENCOUNTER — Encounter: Payer: Self-pay | Admitting: Internal Medicine

## 2019-06-07 ENCOUNTER — Other Ambulatory Visit: Payer: Self-pay | Admitting: Internal Medicine

## 2019-06-07 DIAGNOSIS — I2510 Atherosclerotic heart disease of native coronary artery without angina pectoris: Secondary | ICD-10-CM

## 2019-06-07 DIAGNOSIS — E7849 Other hyperlipidemia: Secondary | ICD-10-CM

## 2019-06-07 MED ORDER — ROSUVASTATIN CALCIUM 20 MG PO TABS: 20.0000 mg | ORAL_TABLET | Freq: Every day | ORAL | 1 refills | Status: DC

## 2019-06-12 ENCOUNTER — Other Ambulatory Visit: Payer: Self-pay | Admitting: Internal Medicine

## 2019-06-12 NOTE — Progress Notes (Signed)
Cardiology Office Note   Date:  06/13/2019   ID:  [name] [name], DOB 1950-06-29, MRN [id number]  PCP:  [name] Rail, MD  Cardiologist:   [name] Rossi Martinique, MD   Chief Complaint  Patient presents with  . Coronary Artery Disease      History of Present Illness: [name] [name] is a 69 y.o. male who is seen at the request of Dr [name] [name] for evaluation of coronary artery calcification. He has a history of HLD and hypothyroidism. 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 Now on Imbruvica. Recent CT in February 2021 showed calcification in the left main coronary artery.    He retired from the [employer] in 2008. Prior to that he had yearly stress testing done. He has no history of DM, HTN, or tobacco use. Recently pravastatin was changed to Crestor for HLD. He does have a family history of CAD with father dying at age 46 with MI. He denies any symptoms of chest pain, SOB, palpitations.  Past Medical History:  Diagnosis Date  . Arthritis   . Hemorrhoids 2014  . Hyperlipidemia   . 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 Thyroid disease 2007   hypothyroidism    Past Surgical History:  Procedure Laterality Date  . COLONOSCOPY  2008   Dr [name] [name]  . COLONOSCOPY W/ POLYPECTOMY  2014   Dr [name] [name]  . POLYPECTOMY    . [facility] PLACEMENT  Dec 2012   REMOVED spring 2014  . TONSILLECTOMY       Current Outpatient Medications  Medication Sig Dispense Refill  . aspirin EC 81 MG tablet Take 81 mg by mouth at bedtime.    . Ibrutinib (IMBRUVICA) 560 MG TABS Take 560 mg by mouth daily. 30 tablet 6  . levothyroxine (SYNTHROID) 150 MCG tablet Take 1 tablet (150 mcg total) by mouth daily. 30 tablet 1  . Multiple Vitamins-Minerals (CENTRUM SILVER 50+MEN PO) Take by mouth.    . rosuvastatin (CRESTOR) 20 MG tablet Take 1 tablet (20 mg total) by mouth daily. 90 tablet 1   No current facility-administered  medications for this visit.    Allergies:   Patient has no known allergies.    Social History:  The patient  reports that he has never smoked. He has never used smokeless tobacco. He reports current alcohol use. He reports that he does not use drugs.   Family History:  The patient's family history includes Alzheimer's disease in his mother; COPD in his brother; Coronary artery disease in his brother; Dementia in his brother; Diabetes in his brother; Heart attack (age of onset: 25) in his father; Heart disease in his paternal grandfather; Hypertension in his brother; Stroke in his brother.    ROS:  Please see the history of present illness.   Otherwise, review of systems are positive for none.   All other systems are reviewed and negative.    PHYSICAL EXAM: VS:  BP 135/90   Pulse 60   Temp (!) 97 F (36.1 C)   Resp (!) 97   Ht 5\' 11"  (1.803 m)   Wt 209 lb (94.8 kg)   BMI 29.15 kg/m  , BMI Body mass index is 29.15 kg/m. GEN: Well nourished, well developed, in no acute distress  HEENT: normal  Neck: no JVD, carotid bruits, or masses Cardiac: RRR; no murmurs, rubs, or gallops,no edema  Respiratory:  clear to auscultation bilaterally,  normal work of breathing GI: soft, nontender, nondistended, + BS MS: no deformity or atrophy  Skin: warm and dry, no rash Neuro:  Strength and sensation are intact Psych: euthymic mood, full affect   EKG:  EKG is ordered today. The ekg ordered today demonstrates NSR rate 60. Normal. I have personally reviewed and interpreted this study.    Recent Labs: 05/13/2019: ALT 11; BUN 25; Creatinine 1.16; Hemoglobin 11.6; Platelet Count 168; Potassium 4.2; Sodium 137 06/05/2019: TSH 6.33    Lipid Panel    Component Value Date/Time   CHOL 136 04/19/2019 0922   CHOL 161 10/19/2015 0903   TRIG 109.0 04/19/2019 0922   TRIG 175 (H) 10/19/2015 0903   TRIG 106 02/22/2006 1133   HDL 34.90 (L) 04/19/2019 0922   HDL 40 10/19/2015 0903   CHOLHDL 4  04/19/2019 0922   VLDL 21.8 04/19/2019 0922   LDLCALC 79 04/19/2019 0922   LDLCALC 86 10/19/2015 0903      Wt Readings from Last 3 Encounters:  06/13/19 209 lb (94.8 kg)  05/20/19 205 lb (93 kg)  05/13/19 210 lb (95.3 kg)      Other studies Reviewed: Additional studies/ records that were reviewed today include: none. Review of the above records demonstrates: N/A   ASSESSMENT AND [name]:  1.  Coronary calcification. I personally reviewed his CT that does show focal calcification of the left main. Otherwise no significant calcification. He is asymptomatic. Risk factors of hypercholesterolemia and family history of premature CAD. I have recommended an ETT to further assess risk. If normal would just focus on lipid lowering therapy and lifestyle modification 2. Hypercholesterolemia. Recently switched from pravastatin to Crestor. Goal LDL < 70. 3. Marginal zone lymphoma recurrent. Per oncology   Current medicines are reviewed at length with the patient today.  The patient does not have concerns regarding medicines.  The following changes have been made:  no change  Labs/ tests ordered today include:   Orders Placed This Encounter  Procedures  . Exercise Tolerance Test  . EKG 12-Lead     Disposition:   FU TBD  Signed, [name] Bowne Martinique, MD  06/13/2019 12:14 PM    [facility] [address], [place], [place], [address] Phone [phone], Fax [phone]

## 2019-06-13 ENCOUNTER — Other Ambulatory Visit: Payer: Self-pay

## 2019-06-13 ENCOUNTER — Ambulatory Visit: Payer: PPO | Admitting: Cardiology

## 2019-06-13 ENCOUNTER — Encounter: Payer: Self-pay | Admitting: Cardiology

## 2019-06-13 VITALS — BP 135/90 | HR 60 | Temp 97.0°F | Resp 97 | Ht 71.0 in | Wt 209.0 lb

## 2019-06-13 DIAGNOSIS — R931 Abnormal findings on diagnostic imaging of heart and coronary circulation: Secondary | ICD-10-CM | POA: Diagnosis not present

## 2019-06-13 NOTE — Patient Instructions (Signed)
Medication Instructions:  Continue same medications    Lab Work: None ordered    Testing/Procedures: Schedule Treadmill  ( POET )   Follow-Up: At [facility], you and your health needs are our priority.  As part of our continuing mission to provide you with exceptional heart care, we have created designated Provider Care Teams.  These Care Teams include your primary Cardiologist (physician) and Advanced Practice Providers (APPs -  Physician Assistants and Nurse Practitioners) who all work together to provide you with the care you need, when you need it.  We recommend signing up for the patient portal called "MyChart".  Sign up information is provided on this After Visit Summary.  MyChart is used to connect with patients for Virtual Visits (Telemedicine).  Patients are able to view lab/test results, encounter notes, upcoming appointments, etc.  Non-urgent messages can be sent to your provider as well.   To learn more about what you can do with MyChart, go to [email or web address].    Your next appointment:  Will be determined after test  The format for your next appointment:Office     Provider: Dr.Jordan

## 2019-06-13 NOTE — Telephone Encounter (Signed)
Oral Oncology Patient Advocate Encounter  Received notification from [name] [name] ([employer]) that patient has been temporarily approved to receive Imbruvica from the manufacturer at $0 out of pocket until 08/09/2019.  Patient must sign a Medicare D Attestation form and return to [employer] to be completely enrolled in their program.    I called and spoke with patient.  He knows we will have to re-apply.   Specialty Pharmacy that will dispense medication is [facility].  Their phone number is [phone]. Patient received his first shipment on 06/12/19.  Patient knows to call the office with questions or concerns.    Oral Oncology Clinic will continue to follow.  [name] Phone [phone] Fax [phone] 06/13/2019 12:37 PM

## 2019-06-17 DIAGNOSIS — Z1283 Encounter for screening for malignant neoplasm of skin: Secondary | ICD-10-CM | POA: Diagnosis not present

## 2019-06-17 DIAGNOSIS — L570 Actinic keratosis: Secondary | ICD-10-CM | POA: Diagnosis not present

## 2019-06-17 DIAGNOSIS — Z85820 Personal history of malignant melanoma of skin: Secondary | ICD-10-CM | POA: Diagnosis not present

## 2019-06-17 DIAGNOSIS — X32XXXD Exposure to sunlight, subsequent encounter: Secondary | ICD-10-CM | POA: Diagnosis not present

## 2019-06-17 DIAGNOSIS — Z08 Encounter for follow-up examination after completed treatment for malignant neoplasm: Secondary | ICD-10-CM | POA: Diagnosis not present

## 2019-06-19 ENCOUNTER — Other Ambulatory Visit: Payer: Self-pay

## 2019-06-19 ENCOUNTER — Encounter: Payer: Self-pay | Admitting: Hematology & Oncology

## 2019-06-19 ENCOUNTER — Inpatient Hospital Stay (HOSPITAL_BASED_OUTPATIENT_CLINIC_OR_DEPARTMENT_OTHER): Payer: PPO | Admitting: Hematology & Oncology

## 2019-06-19 ENCOUNTER — Telehealth: Payer: Self-pay | Admitting: Hematology & Oncology

## 2019-06-19 ENCOUNTER — Inpatient Hospital Stay: Payer: PPO

## 2019-06-19 VITALS — BP 160/95 | HR 64 | Temp 97.5°F | Resp 20 | Wt 209.1 lb

## 2019-06-19 DIAGNOSIS — C8308 Small cell B-cell lymphoma, lymph nodes of multiple sites: Secondary | ICD-10-CM | POA: Diagnosis not present

## 2019-06-19 DIAGNOSIS — C8583 Other specified types of non-Hodgkin lymphoma, intra-abdominal lymph nodes: Secondary | ICD-10-CM | POA: Diagnosis not present

## 2019-06-19 LAB — CMP (CANCER CENTER ONLY)
ALT: 10 U/L (ref 0–44)
AST: 21 U/L (ref 15–41)
Albumin: 3.8 g/dL (ref 3.5–5.0)
Alkaline Phosphatase: 50 U/L (ref 38–126)
Anion gap: 7 (ref 5–15)
BUN: 22 mg/dL (ref 8–23)
CO2: 29 mmol/L (ref 22–32)
Calcium: 9.9 mg/dL (ref 8.9–10.3)
Chloride: 100 mmol/L (ref 98–111)
Creatinine: 1.45 mg/dL — ABNORMAL HIGH (ref 0.61–1.24)
GFR, Est AFR Am: 58 mL/min — ABNORMAL LOW (ref >60)
GFR, Estimated: 50 mL/min — ABNORMAL LOW (ref >60)
Glucose, Bld: 74 mg/dL (ref 70–99)
Potassium: 4.4 mmol/L (ref 3.5–5.1)
Sodium: 136 mmol/L (ref 135–145)
Total Bilirubin: 0.5 mg/dL (ref 0.3–1.2)
Total Protein: 7.9 g/dL (ref 6.5–8.1)

## 2019-06-19 LAB — CBC WITH DIFFERENTIAL (CANCER CENTER ONLY)
Abs Immature Granulocytes: 0.07 10*3/uL (ref 0.00–0.07)
Basophils Absolute: 0.2 10*3/uL — ABNORMAL HIGH (ref 0.0–0.1)
Basophils Relative: 1 %
Eosinophils Absolute: 0.2 10*3/uL (ref 0.0–0.5)
Eosinophils Relative: 1 %
HCT: 37 % — ABNORMAL LOW (ref 39.0–52.0)
Hemoglobin: 11.6 g/dL — ABNORMAL LOW (ref 13.0–17.0)
Immature Granulocytes: 0 %
Lymphocytes Relative: 84 %
Lymphs Abs: 29.2 10*3/uL — ABNORMAL HIGH (ref 0.7–4.0)
MCH: 28.4 pg (ref 26.0–34.0)
MCHC: 31.4 g/dL (ref 30.0–36.0)
MCV: 90.7 fL (ref 80.0–100.0)
Monocytes Absolute: 1.3 10*3/uL — ABNORMAL HIGH (ref 0.1–1.0)
Monocytes Relative: 4 %
Neutro Abs: 3.5 10*3/uL (ref 1.7–7.7)
Neutrophils Relative %: 10 %
Platelet Count: 119 10*3/uL — ABNORMAL LOW (ref 150–400)
RBC: 4.08 MIL/uL — ABNORMAL LOW (ref 4.22–5.81)
RDW: 13.7 % (ref 11.5–15.5)
WBC Count: 34.3 10*3/uL — ABNORMAL HIGH (ref 4.0–10.5)
nRBC: 0 % (ref 0.0–0.2)

## 2019-06-19 LAB — SAVE SMEAR(SSMR), FOR PROVIDER SLIDE REVIEW

## 2019-06-19 LAB — LACTATE DEHYDROGENASE: LDH: 203 U/L — ABNORMAL HIGH (ref 98–192)

## 2019-06-19 NOTE — Telephone Encounter (Signed)
Appointments scheduled calendar declined due to My Chart Access per 3/31 los

## 2019-06-19 NOTE — Progress Notes (Signed)
Hematology and Oncology Follow Up Visit  [name] [name] [id number] 1950-10-13 69 y.o. 06/19/2019   Principle Diagnosis:   Marginal zone lymphoma-treated with R-CVP in February 2013 -- relapsed  Current Therapy:    Imbruvica 560 mg po q day - start on 05/27/2019     Interim History:  [name] is back for follow-up.  He is started the [place].  He has been on for about 3 weeks.  So far, he is doing okay with it.  He may have little bit of fatigue.  He is not felt any palpitations.  Has a little bit of fullness in his abdomen.  There has been no problems with bowels or bladder.  He has had no problems with diarrhea.  There is been no rashes.  He has had some small hematomas on his skin.  Again I suspect this probably is from the [place].  There has been no problems with fever.  Overall, I would say his performance status is ECOG 1.    Medications:  Current Outpatient Medications:  .  aspirin EC 81 MG tablet, Take 81 mg by mouth at bedtime., Disp: , Rfl:  .  Ibrutinib (IMBRUVICA) 560 MG TABS, Take 560 mg by mouth daily., Disp: 30 tablet, Rfl: 6 .  levothyroxine (SYNTHROID) 150 MCG tablet, Take 1 tablet (150 mcg total) by mouth daily., Disp: 30 tablet, Rfl: 1 .  Multiple Vitamins-Minerals (CENTRUM SILVER 50+MEN PO), Take by mouth., Disp: , Rfl:  .  rosuvastatin (CRESTOR) 20 MG tablet, Take 1 tablet (20 mg total) by mouth daily., Disp: 90 tablet, Rfl: 1  Allergies: No Known Allergies  Past Medical History, Surgical history, Social history, and Family History were reviewed and updated.  Review of Systems: Review of Systems  Constitutional: Negative.   HENT:  Negative.   Eyes: Negative.   Respiratory: Negative.   Cardiovascular: Negative.   Gastrointestinal: Positive for abdominal pain and nausea.  Endocrine: Negative.   Genitourinary: Negative.    Musculoskeletal: Negative.   Skin: Negative.   Neurological: Negative.   Hematological: Negative.     Psychiatric/Behavioral: Negative.     Physical Exam:  weight is 209 lb 1.9 oz (94.9 kg). His temporal temperature is 97.5 F (36.4 C) (abnormal). His blood pressure is 182/90 (abnormal) and his pulse is 64. His respiration is 20 and oxygen saturation is 97%.   Wt Readings from Last 3 Encounters:  06/19/19 209 lb 1.9 oz (94.9 kg)  06/13/19 209 lb (94.8 kg)  05/20/19 205 lb (93 kg)    Physical Exam Vitals reviewed.  HENT:     Head: Normocephalic and atraumatic.  Eyes:     Pupils: Pupils are equal, round, and reactive to light.  Cardiovascular:     Rate and Rhythm: Normal rate and regular rhythm.     Heart sounds: Normal heart sounds.  Pulmonary:     Effort: Pulmonary effort is normal.     Breath sounds: Normal breath sounds.  Abdominal:     General: Bowel sounds are normal.     Palpations: Abdomen is soft.  Musculoskeletal:        General: No tenderness or deformity. Normal range of motion.     Cervical back: Normal range of motion.  Lymphadenopathy:     Cervical: No cervical adenopathy.  Skin:    General: Skin is warm and dry.     Findings: No erythema or rash.  Neurological:     Mental Status: He is alert and oriented to person, place,  and time.  Psychiatric:        Behavior: Behavior normal.        Thought Content: Thought content normal.        Judgment: Judgment normal.      Lab Results  Component Value Date   WBC 15.1 (H) 05/13/2019   HGB 11.6 (L) 05/13/2019   HCT 35.6 (L) 05/13/2019   MCV 88.3 05/13/2019   PLT 168 05/13/2019     Chemistry      Component Value Date/Time   NA 137 05/13/2019 1428   NA 142 04/25/2016 0844   NA 140 10/19/2015 0903   K 4.2 05/13/2019 1428   K 4.1 04/25/2016 0844   K 4.1 10/19/2015 0903   CL 102 05/13/2019 1428   CL 102 04/25/2016 0844   CO2 27 05/13/2019 1428   CO2 29 04/25/2016 0844   CO2 25 10/19/2015 0903   BUN 25 (H) 05/13/2019 1428   BUN 14 04/25/2016 0844   BUN 21.6 10/19/2015 0903   CREATININE 1.16  05/13/2019 1428   CREATININE 1.1 04/25/2016 0844   CREATININE 1.3 10/19/2015 0903      Component Value Date/Time   CALCIUM 10.4 (H) 05/13/2019 1428   CALCIUM 9.6 04/25/2016 0844   CALCIUM 9.4 10/19/2015 0903   ALKPHOS 77 05/13/2019 1428   ALKPHOS 59 04/25/2016 0844   ALKPHOS 68 10/19/2015 0903   AST 17 05/13/2019 1428   AST 21 10/19/2015 0903   ALT 11 05/13/2019 1428   ALT 27 04/25/2016 0844   ALT 19 10/19/2015 0903   BILITOT 0.3 05/13/2019 1428   BILITOT 0.54 10/19/2015 0903      Impression and Plan: [name] is a 69 year old white male.  We treated him 8 years ago.  He had a marginal zone lymphoma.  He went into clinical remission.  When he initially presented, he had splenomegaly.  This resolved.  I am not surprised that the white cell count is up and the lymphocyte percentage is up.  This typically is how Imbruvica works.  I suspect that his blood counts will not normalize probably for another 2 or 3 months.  There is no need to make any changes with his dosing.  We will now plan to get him back in about 6 weeks.  I think this would be reasonable for follow-up.   [name], MD 3/31/20218:38 AM

## 2019-06-19 NOTE — Telephone Encounter (Signed)
Oral Chemotherapy Pharmacist Encounter   Patient called to check on the status of his application. He stated that his wife faxed the Medicare D attestation to [employer] on 06/14/19.  I called [employer] to check on the status. They stated that they received the form and it was added to the patient's account on 06/17/19. It takes them 2-3 business days to update patients to full approval after receiving the form. They will mail a letter to [name] [name] once he is fully approved.   I called [name] [name] back to let him know the information from [employer], no answer. LVM with the information from [employer].  Darl Pikes, PharmD, BCPS, BCOP, CPP Hematology/Oncology Clinical Pharmacist [facility]/HP/AP Oral [place] [facility] [phone]  06/19/2019 4:08 PM

## 2019-06-20 LAB — KAPPA/LAMBDA LIGHT CHAINS
Kappa free light chain: 184.4 mg/L — ABNORMAL HIGH (ref 3.3–19.4)
Kappa, lambda light chain ratio: 5.91 — ABNORMAL HIGH (ref 0.26–1.65)
Lambda free light chains: 31.2 mg/L — ABNORMAL HIGH (ref 5.7–26.3)

## 2019-06-20 LAB — IGG, IGA, IGM
IgA: 227 mg/dL (ref 61–437)
IgG (Immunoglobin G), Serum: 1002 mg/dL (ref 603–1613)
IgM (Immunoglobulin M), Srm: 2507 mg/dL — ABNORMAL HIGH (ref 20–172)

## 2019-06-27 ENCOUNTER — Encounter: Payer: Self-pay | Admitting: Hematology & Oncology

## 2019-06-28 ENCOUNTER — Other Ambulatory Visit: Payer: Self-pay | Admitting: Internal Medicine

## 2019-07-02 ENCOUNTER — Other Ambulatory Visit (HOSPITAL_COMMUNITY): Payer: PPO

## 2019-07-02 ENCOUNTER — Other Ambulatory Visit (HOSPITAL_COMMUNITY)
Admission: RE | Admit: 2019-07-02 | Discharge: 2019-07-02 | Disposition: A | Discharge status: Home / Self Care | Payer: PPO | Source: Ambulatory Visit | Attending: Cardiology | Admitting: Cardiology

## 2019-07-02 DIAGNOSIS — Z20822 Contact with and (suspected) exposure to covid-19: Secondary | ICD-10-CM | POA: Insufficient documentation

## 2019-07-02 DIAGNOSIS — Z01812 Encounter for preprocedural laboratory examination: Secondary | ICD-10-CM | POA: Insufficient documentation

## 2019-07-02 LAB — SARS CORONAVIRUS 2 (TAT 6-24 HRS): SARS Coronavirus 2: NEGATIVE

## 2019-07-03 ENCOUNTER — Telehealth (HOSPITAL_COMMUNITY): Payer: Self-pay

## 2019-07-03 NOTE — Telephone Encounter (Signed)
Encounter complete. 

## 2019-07-03 NOTE — Telephone Encounter (Signed)
Oral Oncology Patient Advocate Encounter  Called to check application status from [place] [facility] [place].  Patient has been successfully enrolled into their program to receive Imbruvica from the manufacturer at $0 out of pocket until 03/20/2020.    I called and spoke with patient.  He knows we will have to re-apply.   Patient knows to call the office with questions or concerns.   Oral Oncology Clinic will continue to follow.  [place] Patient [place] Phone [phone] Fax [phone] 07/03/2019 11:10 AM

## 2019-07-05 ENCOUNTER — Other Ambulatory Visit: Payer: Self-pay

## 2019-07-05 ENCOUNTER — Ambulatory Visit (HOSPITAL_COMMUNITY)
Admission: RE | Admit: 2019-07-05 | Discharge: 2019-07-05 | Disposition: A | Discharge status: Home / Self Care | Payer: PPO | Source: Ambulatory Visit | Attending: Cardiovascular Disease | Admitting: Cardiovascular Disease

## 2019-07-05 DIAGNOSIS — R931 Abnormal findings on diagnostic imaging of heart and coronary circulation: Secondary | ICD-10-CM | POA: Diagnosis not present

## 2019-07-05 LAB — EXERCISE TOLERANCE TEST
Estimated workload: 7 METS
Exercise duration (min): 6 min
Exercise duration (sec): 0 s
MPHR: 154 {beats}/min
Peak HR: 142 {beats}/min
Percent HR: 92 %
RPE: 18
Rest HR: 66 {beats}/min

## 2019-07-08 ENCOUNTER — Telehealth: Payer: Self-pay | Admitting: Cardiology

## 2019-07-08 ENCOUNTER — Encounter: Payer: Self-pay | Admitting: Hematology & Oncology

## 2019-07-08 MED ORDER — AMLODIPINE BESYLATE 5 MG PO TABS: 5.0000 mg | ORAL_TABLET | Freq: Every day | ORAL | 6 refills | Status: DC

## 2019-07-08 NOTE — Telephone Encounter (Signed)
High blood pressure is expected with up to 20% of patients taking imbruvica.  Lets start amlodipine 5mg  daily to control your blood pressure. This medication will not affect your kidneys or cause slow heart rate.   Keep records of BP and HR readings (no more than twice daily) and schedule follow up with HTN clinic in 3-4 weeks for assessment and titration.

## 2019-07-08 NOTE — Telephone Encounter (Signed)
Follow up   The pharmacy to the prescription to per the patient is [facility] [place], [place]

## 2019-07-08 NOTE — Telephone Encounter (Signed)
Called patient no answer.LMTC. 

## 2019-07-08 NOTE — Telephone Encounter (Signed)
Spoke to patient he stated he had a treadmill test done last Friday 4/16.Stated B/P was elevated during test.Stated over the weekend B/P continues to be elevated.Reading listed below.This morning B/P 195/96.Pulse ranging in 60's.Stated he started on Imbruvica 05/27/19. Stated elevated B/P is a side effect.Stated cancer Dr.wants him to take for 1 year.Advised Dr.Jordan is out of office this week.I will send message to our pharmacist for advice.

## 2019-07-08 NOTE — Telephone Encounter (Signed)
New Message  Pt c/o BP issue: STAT if pt c/o blurred vision, one-sided weakness or slurred speech  1. What are your last 5 BP readings? 181/91, 195/96, 180/90, 177/98  2. Are you having any other symptoms (ex. Dizziness, headache, blurred vision, passed out)? Dizziness, confusion,   3. What is your BP issue? Bp elevated.  Patient is taking Imbruvica for lymphoma.

## 2019-07-08 NOTE — Telephone Encounter (Signed)
Spoke to patient [name] [name]'s advice given.Scheduler will call back with HTN clinic appointment.Advised to bring B/P readings to appointment.

## 2019-07-30 ENCOUNTER — Inpatient Hospital Stay (HOSPITAL_BASED_OUTPATIENT_CLINIC_OR_DEPARTMENT_OTHER): Payer: PPO | Admitting: Hematology & Oncology

## 2019-07-30 ENCOUNTER — Other Ambulatory Visit: Payer: Self-pay

## 2019-07-30 ENCOUNTER — Inpatient Hospital Stay: Payer: PPO | Attending: Hematology & Oncology

## 2019-07-30 ENCOUNTER — Encounter: Payer: Self-pay | Admitting: Hematology & Oncology

## 2019-07-30 ENCOUNTER — Telehealth: Payer: Self-pay | Admitting: Hematology & Oncology

## 2019-07-30 VITALS — BP 155/85 | HR 74 | Temp 97.7°F | Resp 18 | Wt 208.0 lb

## 2019-07-30 DIAGNOSIS — C8583 Other specified types of non-Hodgkin lymphoma, intra-abdominal lymph nodes: Secondary | ICD-10-CM

## 2019-07-30 LAB — CBC WITH DIFFERENTIAL (CANCER CENTER ONLY)
Abs Immature Granulocytes: 0.05 10*3/uL (ref 0.00–0.07)
Basophils Absolute: 0.2 10*3/uL — ABNORMAL HIGH (ref 0.0–0.1)
Basophils Relative: 1 %
Eosinophils Absolute: 0.1 10*3/uL (ref 0.0–0.5)
Eosinophils Relative: 0 %
HCT: 40.1 % (ref 39.0–52.0)
Hemoglobin: 12.8 g/dL — ABNORMAL LOW (ref 13.0–17.0)
Immature Granulocytes: 0 %
Lymphocytes Relative: 80 %
Lymphs Abs: 22.2 10*3/uL — ABNORMAL HIGH (ref 0.7–4.0)
MCH: 28.6 pg (ref 26.0–34.0)
MCHC: 31.9 g/dL (ref 30.0–36.0)
MCV: 89.5 fL (ref 80.0–100.0)
Monocytes Absolute: 0.7 10*3/uL (ref 0.1–1.0)
Monocytes Relative: 3 %
Neutro Abs: 4.2 10*3/uL (ref 1.7–7.7)
Neutrophils Relative %: 16 %
Platelet Count: 126 10*3/uL — ABNORMAL LOW (ref 150–400)
RBC: 4.48 MIL/uL (ref 4.22–5.81)
RDW: 14 % (ref 11.5–15.5)
WBC Count: 27.4 10*3/uL — ABNORMAL HIGH (ref 4.0–10.5)
nRBC: 0 % (ref 0.0–0.2)

## 2019-07-30 LAB — CMP (CANCER CENTER ONLY)
ALT: 14 U/L (ref 0–44)
AST: 23 U/L (ref 15–41)
Albumin: 3.8 g/dL (ref 3.5–5.0)
Alkaline Phosphatase: 47 U/L (ref 38–126)
Anion gap: 6 (ref 5–15)
BUN: 23 mg/dL (ref 8–23)
CO2: 30 mmol/L (ref 22–32)
Calcium: 9.9 mg/dL (ref 8.9–10.3)
Chloride: 102 mmol/L (ref 98–111)
Creatinine: 1.44 mg/dL — ABNORMAL HIGH (ref 0.61–1.24)
GFR, Est AFR Am: 58 mL/min — ABNORMAL LOW (ref >60)
GFR, Estimated: 50 mL/min — ABNORMAL LOW (ref >60)
Glucose, Bld: 90 mg/dL (ref 70–99)
Potassium: 4.3 mmol/L (ref 3.5–5.1)
Sodium: 138 mmol/L (ref 135–145)
Total Bilirubin: 0.6 mg/dL (ref 0.3–1.2)
Total Protein: 7.6 g/dL (ref 6.5–8.1)

## 2019-07-30 LAB — SAVE SMEAR(SSMR), FOR PROVIDER SLIDE REVIEW

## 2019-07-30 LAB — LACTATE DEHYDROGENASE: LDH: 201 U/L — ABNORMAL HIGH (ref 98–192)

## 2019-07-30 NOTE — Progress Notes (Signed)
Hematology and Oncology Follow Up Visit  [name] [name] [id number] 25-Aug-1950 69 y.o. 07/30/2019   Principle Diagnosis:   Marginal zone lymphoma-treated with R-CVP in February 2013 -- relapsed  Current Therapy:    Imbruvica 560 mg po q day - start on 05/27/2019     Interim History:  [name] [name] is back for follow-up.  So far, things are going pretty well.  He is doing okay on the [place].  He really has had no problems with toxicity.  There has been no problems with fever.  He has had no cough or shortness of breath.  He did have an episode of some discomfort around the left side.  He has had no bleeding.  He is now on blood pressure medications.  I think he is on amlodipine.  There is been no problems with nausea or vomiting.  He has had no headache.  He has had no mouth sores.  Overall, I think that his performance status is probably ECOG 1.     Medications:  Current Outpatient Medications:  .  amLODipine (NORVASC) 5 MG tablet, Take 1 tablet (5 mg total) by mouth daily., Disp: 30 tablet, Rfl: 6 .  Ibrutinib (IMBRUVICA) 560 MG TABS, Take 560 mg by mouth daily., Disp: 30 tablet, Rfl: 6 .  levothyroxine (SYNTHROID) 150 MCG tablet, TAKE 1 TABLET BY MOUTH EVERY DAY, Disp: 90 tablet, Rfl: 1 .  Multiple Vitamins-Minerals (CENTRUM SILVER 50+MEN PO), Take by mouth., Disp: , Rfl:  .  rosuvastatin (CRESTOR) 20 MG tablet, Take 1 tablet (20 mg total) by mouth daily., Disp: 90 tablet, Rfl: 1  Allergies: No Known Allergies  Past Medical History, Surgical history, Social history, and Family History were reviewed and updated.  Review of Systems: Review of Systems  Constitutional: Negative.   HENT:  Negative.   Eyes: Negative.   Respiratory: Negative.   Cardiovascular: Negative.   Gastrointestinal: Positive for abdominal pain and nausea.  Endocrine: Negative.   Genitourinary: Negative.    Musculoskeletal: Negative.   Skin: Negative.   Neurological: Negative.   Hematological:  Negative.   Psychiatric/Behavioral: Negative.     Physical Exam:  weight is 208 lb (94.3 kg). His other (comment) temperature is 97.7 F (36.5 C). His blood pressure is 155/85 (abnormal) and his pulse is 74. His respiration is 18 and oxygen saturation is 100%.   Wt Readings from Last 3 Encounters:  07/30/19 208 lb (94.3 kg)  06/19/19 209 lb 1.9 oz (94.9 kg)  06/13/19 209 lb (94.8 kg)    Physical Exam Vitals reviewed.  HENT:     Head: Normocephalic and atraumatic.  Eyes:     Pupils: Pupils are equal, round, and reactive to light.  Cardiovascular:     Rate and Rhythm: Normal rate and regular rhythm.     Heart sounds: Normal heart sounds.  Pulmonary:     Effort: Pulmonary effort is normal.     Breath sounds: Normal breath sounds.  Abdominal:     General: Bowel sounds are normal.     Palpations: Abdomen is soft.  Musculoskeletal:        General: No tenderness or deformity. Normal range of motion.     Cervical back: Normal range of motion.  Lymphadenopathy:     Cervical: No cervical adenopathy.  Skin:    General: Skin is warm and dry.     Findings: No erythema or rash.  Neurological:     Mental Status: He is alert and oriented to person, place, and  time.  Psychiatric:        Behavior: Behavior normal.        Thought Content: Thought content normal.        Judgment: Judgment normal.      Lab Results  Component Value Date   WBC 27.4 (H) 07/30/2019   HGB 12.8 (L) 07/30/2019   HCT 40.1 07/30/2019   MCV 89.5 07/30/2019   PLT 126 (L) 07/30/2019     Chemistry      Component Value Date/Time   NA 138 07/30/2019 0835   NA 142 04/25/2016 0844   NA 140 10/19/2015 0903   K 4.3 07/30/2019 0835   K 4.1 04/25/2016 0844   K 4.1 10/19/2015 0903   CL 102 07/30/2019 0835   CL 102 04/25/2016 0844   CO2 30 07/30/2019 0835   CO2 29 04/25/2016 0844   CO2 25 10/19/2015 0903   BUN 23 07/30/2019 0835   BUN 14 04/25/2016 0844   BUN 21.6 10/19/2015 0903   CREATININE 1.44 (H)  07/30/2019 0835   CREATININE 1.1 04/25/2016 0844   CREATININE 1.3 10/19/2015 0903      Component Value Date/Time   CALCIUM 9.9 07/30/2019 0835   CALCIUM 9.6 04/25/2016 0844   CALCIUM 9.4 10/19/2015 0903   ALKPHOS 47 07/30/2019 0835   ALKPHOS 59 04/25/2016 0844   ALKPHOS 68 10/19/2015 0903   AST 23 07/30/2019 0835   AST 21 10/19/2015 0903   ALT 14 07/30/2019 0835   ALT 27 04/25/2016 0844   ALT 19 10/19/2015 0903   BILITOT 0.6 07/30/2019 0835   BILITOT 0.54 10/19/2015 0903      Impression and Plan: [name] [name] is a 69 year old white male.  We treated him 8 years ago.  He had a marginal zone lymphoma.  He went into clinical remission.  When he initially presented, he had splenomegaly.  This resolved.  His white cell count started to come down a little bit.  I had to believe that this is all reflective of the Imbruvica starting to work.   We will still continue to follow him along.  Again, he seems to be improving with each visit.  It is still too soon to do any scans on him.  We will plan to get him back in another 6 weeks.    [name], MD 5/11/20219:43 AM

## 2019-07-30 NOTE — Telephone Encounter (Signed)
Appointments scheduled calendar declined per 5/11 los

## 2019-07-31 LAB — IGG, IGA, IGM
IgA: 223 mg/dL (ref 61–437)
IgG (Immunoglobin G), Serum: 840 mg/dL (ref 603–1613)
IgM (Immunoglobulin M), Srm: 1887 mg/dL — ABNORMAL HIGH (ref 20–172)

## 2019-08-06 ENCOUNTER — Other Ambulatory Visit: Payer: Self-pay

## 2019-08-06 ENCOUNTER — Ambulatory Visit (INDEPENDENT_AMBULATORY_CARE_PROVIDER_SITE_OTHER): Payer: PPO | Admitting: Pharmacist

## 2019-08-06 DIAGNOSIS — I159 Secondary hypertension, unspecified: Secondary | ICD-10-CM

## 2019-08-06 MED ORDER — AMLODIPINE BESYLATE 5 MG PO TABS: 7.5000 mg | ORAL_TABLET | Freq: Every day | ORAL | 3 refills | Status: DC

## 2019-08-06 NOTE — Patient Instructions (Addendum)
Return for a  follow up appointment in 4 weeks  Check your blood pressure at home daily (if able) and keep record of the readings.  Take your BP meds as follows: *Increase amlodipine to 7.5 daily (okay to take 1 and and 1/2 tablet)*   Bring all of your meds, your BP cuff and your record of home blood pressures to your next appointment.  Exercise as you're able, try to walk approximately 30 minutes per day.  Keep salt intake to a minimum, especially watch canned and prepared boxed foods.  Eat more fresh fruits and vegetables and fewer canned items.  Avoid eating in fast food restaurants.    HOW TO TAKE YOUR BLOOD PRESSURE: . Rest 5 minutes before taking your blood pressure. .  Don't smoke or drink caffeinated beverages for at least 30 minutes before. . Take your blood pressure before (not after) you eat. . Sit comfortably with your back supported and both feet on the floor (don't cross your legs). . Elevate your arm to heart level on a table or a desk. . Use the proper sized cuff. It should fit smoothly and snugly around your bare upper arm. There should be enough room to slip a fingertip under the cuff. The bottom edge of the cuff should be 1 inch above the crease of the elbow. . Ideally, take 3 measurements at one sitting and record the average.

## 2019-08-06 NOTE — Progress Notes (Signed)
Patient ID: [name] [name]                 DOB: 1950-09-26                      MRN: [id number]     HPI: [name] [name] is a 69 y.o. male referred by Dr. Martinique to HTN clinic. PMH includes hyperlipidemia, and hypothyroidism. He is also undergoing therapy for lymphoma with Imbruvica. [name] [name] is know to cause HTN in up to 20% of cases. Patient contacted clinic 4 weeks ago to report elevated BP of 195/96 and we started amlodipine 5mg  daily. Patient denies problems with current therapy, swelling,increased fatigue, or headaches.  Current HTN meds:  Amlodipine 5mg  daily  Previously tried: none  BP goal: 130/80  Family History: The patient's family history includes Alzheimer's disease in his mother; COPD in his brother; Coronary artery disease in his brother; Dementia in his brother; Diabetes in his brother; Heart attack (age of onset: 55) in his father; Heart disease in his paternal grandfather; Hypertension in his brother; Stroke in his brother.   Social History: The patient  reports that he has never smoked. He has never used smokeless tobacco. He reports current alcohol use. He reports that he does not use drugs  Diet: avoid added salt but working on decreasing sodium in diet and eating health food  Exercise: activities of daily living  Home BP readings:  14 readings in the last 8 days; average 164/82 (range 148/83-185/88)   *Home BP cuff is accurate for his systolic within 95AOZH from manual reading. Noted  machine stopped diastolic reading about 08MVHQ higher than manual reading (patinet has a small paused in manual pulse no symptoms and never noted before*  Wt Readings from Last 3 Encounters:  08/06/19 211 lb 9.6 oz (96 kg)  07/30/19 208 lb (94.3 kg)  06/19/19 209 lb 1.9 oz (94.9 kg)   BP Readings from Last 3 Encounters:  08/06/19 (!) 144/72  07/30/19 (!) 155/85  06/19/19 (!) 160/95   Pulse Readings from Last 3 Encounters:  08/06/19 68  07/30/19 74  06/19/19 64    Renal  function: Estimated Creatinine Clearance: 59.7 mL/min (A) (by C-G formula based on SCr of 1.44 mg/dL (H)).  Past Medical History:  Diagnosis Date  . Arthritis   . Hemorrhoids 2014  . Hyperlipidemia   . Hypertension 08/09/2019  . 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 Thyroid disease 2007   hypothyroidism    Current Outpatient Medications on File Prior to Visit  Medication Sig Dispense Refill  . Ibrutinib (IMBRUVICA) 560 MG TABS Take 560 mg by mouth daily. 30 tablet 6  . levothyroxine (SYNTHROID) 150 MCG tablet TAKE 1 TABLET BY MOUTH EVERY DAY 90 tablet 1  . Multiple Vitamins-Minerals (CENTRUM SILVER 50+MEN PO) Take by mouth.    . rosuvastatin (CRESTOR) 20 MG tablet Take 1 tablet (20 mg total) by mouth daily. 90 tablet 1   No current facility-administered medications on file prior to visit.    No Known Allergies  Blood pressure (!) 144/72, pulse 68, height 5\' 11"  (1.803 m), weight 211 lb 9.6 oz (96 kg), SpO2 98 %.  Hypertension Blood pressure remains above goal during office visit. Patient developed elevated BP d/t Imbruvica therapy for lymphoma. He is tolerating therapy with amlodipine 5mg  with some improvement in BP readings. Need to avoid medication that may worsen renal function d/t current chemotherapy.  Will increase amlodipine to  7.5mg  today and monitor LEE. Plan to follow up in 4 weeks and add hydralazine 25mg  BID to therapy if additional BP control needed.    Freada Twersky Rodriguez-Guzman PharmD, BCPS, [place] [id number] 08/09/2019 2:46 PM

## 2019-08-07 ENCOUNTER — Other Ambulatory Visit (INDEPENDENT_AMBULATORY_CARE_PROVIDER_SITE_OTHER): Payer: PPO

## 2019-08-07 ENCOUNTER — Encounter: Payer: Self-pay | Admitting: Internal Medicine

## 2019-08-07 DIAGNOSIS — E032 Hypothyroidism due to medicaments and other exogenous substances: Secondary | ICD-10-CM | POA: Diagnosis not present

## 2019-08-07 DIAGNOSIS — E7849 Other hyperlipidemia: Secondary | ICD-10-CM

## 2019-08-07 LAB — HEPATIC FUNCTION PANEL
ALT: 14 U/L (ref 0–53)
AST: 23 U/L (ref 0–37)
Albumin: 3.5 g/dL (ref 3.5–5.2)
Alkaline Phosphatase: 52 U/L (ref 39–117)
Bilirubin, Direct: 0.2 mg/dL (ref 0.0–0.3)
Total Bilirubin: 0.5 mg/dL (ref 0.2–1.2)
Total Protein: 7.3 g/dL (ref 6.0–8.3)

## 2019-08-07 LAB — LIPID PANEL
Cholesterol: 140 mg/dL (ref 0–200)
HDL: 47.8 mg/dL (ref >39.00)
LDL Cholesterol: 71 mg/dL (ref 0–99)
NonHDL: 92.5
Total CHOL/HDL Ratio: 3
Triglycerides: 108 mg/dL (ref 0.0–149.0)
VLDL: 21.6 mg/dL (ref 0.0–40.0)

## 2019-08-07 LAB — TSH: TSH: 3.24 u[IU]/mL (ref 0.35–4.50)

## 2019-08-09 ENCOUNTER — Encounter: Payer: Self-pay | Admitting: Pharmacist

## 2019-08-09 DIAGNOSIS — I10 Essential (primary) hypertension: Secondary | ICD-10-CM

## 2019-08-09 HISTORY — DX: Essential (primary) hypertension: I10

## 2019-08-09 NOTE — Assessment & Plan Note (Signed)
Blood pressure remains above goal during office visit. Patient developed elevated BP d/t Imbruvica therapy for lymphoma. He is tolerating therapy with amlodipine 5mg  with some improvement in BP readings. Need to avoid medication that may worsen renal function d/t current chemotherapy.  Will increase amlodipine to 7.5mg  today and monitor LEE. Plan to follow up in 4 weeks and add hydralazine 25mg  BID to therapy if additional BP control needed.

## 2019-09-05 DIAGNOSIS — Z08 Encounter for follow-up examination after completed treatment for malignant neoplasm: Secondary | ICD-10-CM | POA: Diagnosis not present

## 2019-09-05 DIAGNOSIS — Z85820 Personal history of malignant melanoma of skin: Secondary | ICD-10-CM | POA: Diagnosis not present

## 2019-09-05 DIAGNOSIS — Z1283 Encounter for screening for malignant neoplasm of skin: Secondary | ICD-10-CM | POA: Diagnosis not present

## 2019-09-10 ENCOUNTER — Ambulatory Visit (INDEPENDENT_AMBULATORY_CARE_PROVIDER_SITE_OTHER): Payer: PPO | Admitting: Pharmacist

## 2019-09-10 ENCOUNTER — Other Ambulatory Visit: Payer: Self-pay

## 2019-09-10 VITALS — BP 134/82 | Wt 215.6 lb

## 2019-09-10 DIAGNOSIS — I159 Secondary hypertension, unspecified: Secondary | ICD-10-CM | POA: Diagnosis not present

## 2019-09-10 MED ORDER — AMLODIPINE BESYLATE 5 MG PO TABS: 5.0000 mg | ORAL_TABLET | Freq: Every day | ORAL | 3 refills | Status: DC

## 2019-09-10 MED ORDER — HYDRALAZINE HCL 25 MG PO TABS: 25.0000 mg | ORAL_TABLET | Freq: Two times a day (BID) | ORAL | 1 refills | Status: DC

## 2019-09-10 MED ORDER — HYDRALAZINE HCL 25 MG PO TABS
25.0000 mg | ORAL_TABLET | Freq: Two times a day (BID) | ORAL | 1 refills | Status: DC
Start: 2019-09-10 — End: 2019-09-10

## 2019-09-10 NOTE — Patient Instructions (Addendum)
Return for a  follow up appointment in 4-5 weeks  Check your blood pressure at home daily (if able) and keep record of the readings.  Take your BP meds as follows: *DECREASE Amlodipine back to 5mg  daily* *START taking Hydralazine 25mg  twice daily*  Bring all of your meds, your BP cuff and your record of home blood pressures to your next appointment.  Exercise as you're able, try to walk approximately 30 minutes per day.  Keep salt intake to a minimum, especially watch canned and prepared boxed foods.  Eat more fresh fruits and vegetables and fewer canned items.  Avoid eating in fast food restaurants.    HOW TO TAKE YOUR BLOOD PRESSURE: . Rest 5 minutes before taking your blood pressure. .  Don't smoke or drink caffeinated beverages for at least 30 minutes before. . Take your blood pressure before (not after) you eat. . Sit comfortably with your back supported and both feet on the floor (don't cross your legs). . Elevate your arm to heart level on a table or a desk. . Use the proper sized cuff. It should fit smoothly and snugly around your bare upper arm. There should be enough room to slip a fingertip under the cuff. The bottom edge of the cuff should be 1 inch above the crease of the elbow. . Ideally, take 3 measurements at one sitting and record the average.

## 2019-09-10 NOTE — Progress Notes (Signed)
Patient ID: [name] [name]                 DOB: 05/17/1950                      MRN: [id number]     HPI: [name] [name] is a 69 y.o. male referred by Dr. Martinique to HTN clinic. PMH includes hyperlipidemia, and hypothyroidism. He is also undergoing therapy for lymphoma with Imbruvica. [name] [name] is know to cause HTN in up to 20% of cases.I increased his amlodipine to 7.5mg  daily during last OV. Patient presents today to clinic for follow up and report improvement in BP readings. He noticed increased LEE and  increased fatigue. Denies SOB, headaches, chest pain, or blurry vision. Stable on current chemotherapy regimen and plan to continue until the end of the year.  Current HTN meds:  Amlodipine 7.5mg  daily  Previously tried: none  BP goal: 130/80  Family History: The patient's family history includes Alzheimer's disease in his mother; COPD in his brother; Coronary artery disease in his brother; Dementia in his brother; Diabetes in his brother; Heart attack (age of onset: 30) in his father; Heart disease in his paternal grandfather; Hypertension in his brother; Stroke in his brother.   Social History: The patient  reports that he has never smoked. He has never used smokeless tobacco. He reports current alcohol use. He reports that he does not use drugs  Diet: avoid added salt but working on decreasing sodium in diet and eating health food  Exercise: activities of daily living  Home BP readings:  27 readings; range 127/76 to 162/82. HR range 65 to 73bpm  *Home BP cuff is accurate for his systolic within 41YSAY from manual reading. Noted  machine stopped diastolic reading about 30ZSWF higher than manual reading (patinet has a small paused in manual pulse no symptoms and never noted before*  Wt Readings from Last 3 Encounters:  09/10/19 215 lb 9.6 oz (97.8 kg)  08/06/19 211 lb 9.6 oz (96 kg)  07/30/19 208 lb (94.3 kg)   BP Readings from Last 3 Encounters:  09/10/19 134/82  08/06/19 (!)  144/72  07/30/19 (!) 155/85   Pulse Readings from Last 3 Encounters:  08/06/19 68  07/30/19 74  06/19/19 64     Past Medical History:  Diagnosis Date  . Arthritis   . Hemorrhoids 2014  . Hyperlipidemia   . Hypertension 08/09/2019  . 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 Thyroid disease 2007   hypothyroidism    Current Outpatient Medications on File Prior to Visit  Medication Sig Dispense Refill  . Ibrutinib (IMBRUVICA) 560 MG TABS Take 560 mg by mouth daily. 30 tablet 6  . levothyroxine (SYNTHROID) 150 MCG tablet TAKE 1 TABLET BY MOUTH EVERY DAY 90 tablet 1  . Multiple Vitamins-Minerals (CENTRUM SILVER 50+MEN PO) Take by mouth.    . rosuvastatin (CRESTOR) 20 MG tablet Take 1 tablet (20 mg total) by mouth daily. 90 tablet 1   No current facility-administered medications on file prior to visit.    No Known Allergies  Blood pressure 134/82, weight 215 lb 9.6 oz (97.8 kg).  Hypertension Blood pressure ,but remains above desired goal of 130/80. LEE worsen on current therapy as well. Will decrease amlodipine back to 5mg  daily and add hydralazine 25mg  BID to current regimen. We will try to avoid medication that may affect renal function. Patient's current chemotherapy needs close monitoring of renal function and  possible AKI. Plan to follow up in 4 weeks and adjust hydralazine dose as needed.   Ruhani Umland Rodriguez-Guzman PharmD, BCPS, [place] [id number] 09/11/2019 5:06 PM

## 2019-09-11 ENCOUNTER — Encounter: Payer: Self-pay | Admitting: Pharmacist

## 2019-09-11 NOTE — Assessment & Plan Note (Addendum)
Blood pressure ,but remains above desired goal of 130/80. LEE worsen on current therapy as well. Will decrease amlodipine back to 5mg  daily and add hydralazine 25mg  BID to current regimen. We will try to avoid medication that may affect renal function. Patient's current chemotherapy needs close monitoring of renal function and possible AKI. Plan to follow up in 4 weeks and adjust hydralazine dose as needed.

## 2019-09-16 ENCOUNTER — Other Ambulatory Visit: Payer: Self-pay

## 2019-09-16 ENCOUNTER — Inpatient Hospital Stay: Payer: PPO | Attending: Hematology & Oncology

## 2019-09-16 ENCOUNTER — Telehealth: Payer: Self-pay | Admitting: Hematology & Oncology

## 2019-09-16 ENCOUNTER — Inpatient Hospital Stay (HOSPITAL_BASED_OUTPATIENT_CLINIC_OR_DEPARTMENT_OTHER): Payer: PPO | Admitting: Hematology & Oncology

## 2019-09-16 VITALS — BP 151/73 | HR 70 | Temp 97.1°F | Resp 18 | Wt 214.8 lb

## 2019-09-16 DIAGNOSIS — C8583 Other specified types of non-Hodgkin lymphoma, intra-abdominal lymph nodes: Secondary | ICD-10-CM

## 2019-09-16 DIAGNOSIS — M7989 Other specified soft tissue disorders: Secondary | ICD-10-CM | POA: Diagnosis not present

## 2019-09-16 DIAGNOSIS — I10 Essential (primary) hypertension: Secondary | ICD-10-CM | POA: Insufficient documentation

## 2019-09-16 LAB — LACTATE DEHYDROGENASE: LDH: 246 U/L — ABNORMAL HIGH (ref 98–192)

## 2019-09-16 LAB — CMP (CANCER CENTER ONLY)
ALT: 14 U/L (ref 0–44)
AST: 24 U/L (ref 15–41)
Albumin: 3.7 g/dL (ref 3.5–5.0)
Alkaline Phosphatase: 46 U/L (ref 38–126)
Anion gap: 5 (ref 5–15)
BUN: 23 mg/dL (ref 8–23)
CO2: 30 mmol/L (ref 22–32)
Calcium: 9.7 mg/dL (ref 8.9–10.3)
Chloride: 102 mmol/L (ref 98–111)
Creatinine: 1.37 mg/dL — ABNORMAL HIGH (ref 0.61–1.24)
GFR, Est AFR Am: >60 mL/min (ref >60)
GFR, Estimated: 53 mL/min — ABNORMAL LOW (ref >60)
Glucose, Bld: 83 mg/dL (ref 70–99)
Potassium: 4.4 mmol/L (ref 3.5–5.1)
Sodium: 137 mmol/L (ref 135–145)
Total Bilirubin: 0.6 mg/dL (ref 0.3–1.2)
Total Protein: 7.4 g/dL (ref 6.5–8.1)

## 2019-09-16 LAB — CBC WITH DIFFERENTIAL (CANCER CENTER ONLY)
Abs Immature Granulocytes: 0.11 10*3/uL — ABNORMAL HIGH (ref 0.00–0.07)
Basophils Absolute: 0.2 10*3/uL — ABNORMAL HIGH (ref 0.0–0.1)
Basophils Relative: 1 %
Eosinophils Absolute: 0.1 10*3/uL (ref 0.0–0.5)
Eosinophils Relative: 1 %
HCT: 40.1 % (ref 39.0–52.0)
Hemoglobin: 12.9 g/dL — ABNORMAL LOW (ref 13.0–17.0)
Immature Granulocytes: 1 %
Lymphocytes Relative: 74 %
Lymphs Abs: 15.3 10*3/uL — ABNORMAL HIGH (ref 0.7–4.0)
MCH: 29.6 pg (ref 26.0–34.0)
MCHC: 32.2 g/dL (ref 30.0–36.0)
MCV: 92 fL (ref 80.0–100.0)
Monocytes Absolute: 0.7 10*3/uL (ref 0.1–1.0)
Monocytes Relative: 4 %
Neutro Abs: 3.9 10*3/uL (ref 1.7–7.7)
Neutrophils Relative %: 19 %
Platelet Count: 110 10*3/uL — ABNORMAL LOW (ref 150–400)
RBC: 4.36 MIL/uL (ref 4.22–5.81)
RDW: 13.7 % (ref 11.5–15.5)
WBC Count: 20.3 10*3/uL — ABNORMAL HIGH (ref 4.0–10.5)
nRBC: 0 % (ref 0.0–0.2)

## 2019-09-16 LAB — SAVE SMEAR(SSMR), FOR PROVIDER SLIDE REVIEW

## 2019-09-16 NOTE — Telephone Encounter (Signed)
Appointments scheduled calendar printed & mailed per 6/28 los

## 2019-09-16 NOTE — Progress Notes (Signed)
Hematology and Oncology Follow Up Visit  TAWFIQ FAVILA [id number] 1950/12/18 69 y.o. 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 is back for follow-up.  Overall, things are going pretty well for him.  The one problem that has is high blood pressure.  The hypertension clinic that he goes to feels it is the [place].  I suppose this probably is the case.  He now is on hydralazine.  He says little bit of swelling in the legs.  I am sure this is probably from the amlodipine that he is taking.  His white cell count is coming down quite nicely.  He has had no problems with nausea or vomiting.  He does have some bruising.  There has been no fever.  He has had no cough or shortness of breath.  He has had no change in bowel or bladder habits.    Overall, I think that his performance status is probably ECOG 1.     Medications:  Current Outpatient Medications:  .  amLODipine (NORVASC) 5 MG tablet, Take 1 tablet (5 mg total) by mouth daily., Disp: 30 tablet, Rfl: 3 .  hydrALAZINE (APRESOLINE) 25 MG tablet, Take 1 tablet (25 mg total) by mouth in the morning and at bedtime., Disp: 60 tablet, Rfl: 1 .  Ibrutinib (IMBRUVICA) 560 MG TABS, Take 560 mg by mouth daily., Disp: 30 tablet, Rfl: 6 .  levothyroxine (SYNTHROID) 150 MCG tablet, TAKE 1 TABLET BY MOUTH EVERY DAY, Disp: 90 tablet, Rfl: 1 .  Multiple Vitamins-Minerals (CENTRUM SILVER 50+MEN PO), Take by mouth., Disp: , Rfl:  .  rosuvastatin (CRESTOR) 20 MG tablet, Take 1 tablet (20 mg total) by mouth daily., Disp: 90 tablet, Rfl: 1  Allergies: No Known Allergies  Past Medical History, Surgical history, Social history, and Family History were reviewed and updated.  Review of Systems: Review of Systems  Constitutional: Negative.   HENT:  Negative.   Eyes: Negative.     Respiratory: Negative.   Cardiovascular: Negative.   Gastrointestinal: Positive for abdominal pain and nausea.  Endocrine: Negative.   Genitourinary: Negative.    Musculoskeletal: Negative.   Skin: Negative.   Neurological: Negative.   Hematological: Negative.   Psychiatric/Behavioral: Negative.     Physical Exam:  weight is 214 lb 12 oz (97.4 kg). His temporal temperature is 97.1 F (36.2 C) (abnormal). His blood pressure is 151/73 (abnormal) and his pulse is 70. His respiration is 18 and oxygen saturation is 98%.   Wt Readings from Last 3 Encounters:  09/16/19 214 lb 12 oz (97.4 kg)  09/10/19 215 lb 9.6 oz (97.8 kg)  08/06/19 211 lb 9.6 oz (96 kg)    Physical Exam Vitals reviewed.  HENT:     Head: Normocephalic and atraumatic.  Eyes:     Pupils: Pupils are equal, round, and reactive to light.  Cardiovascular:     Rate and Rhythm: Normal rate and regular rhythm.     Heart sounds: Normal heart sounds.  Pulmonary:     Effort: Pulmonary effort is normal.     Breath sounds: Normal breath sounds.  Abdominal:     General: Bowel sounds are normal.     Palpations: Abdomen is soft.  Musculoskeletal:        General: No tenderness or deformity. Normal range of  motion.     Cervical back: Normal range of motion.  Lymphadenopathy:     Cervical: No cervical adenopathy.  Skin:    General: Skin is warm and dry.     Findings: No erythema or rash.  Neurological:     Mental Status: He is alert and oriented to person, place, and time.  Psychiatric:        Behavior: Behavior normal.        Thought Content: Thought content normal.        Judgment: Judgment normal.      Lab Results  Component Value Date   WBC 20.3 (H) 09/16/2019   HGB 12.9 (L) 09/16/2019   HCT 40.1 09/16/2019   MCV 92.0 09/16/2019   PLT 110 (L) 09/16/2019     Chemistry      Component Value Date/Time   NA 138 07/30/2019 0835   NA 142 04/25/2016 0844   NA 140 10/19/2015 0903   K 4.3 07/30/2019 0835   K  4.1 04/25/2016 0844   K 4.1 10/19/2015 0903   CL 102 07/30/2019 0835   CL 102 04/25/2016 0844   CO2 30 07/30/2019 0835   CO2 29 04/25/2016 0844   CO2 25 10/19/2015 0903   BUN 23 07/30/2019 0835   BUN 14 04/25/2016 0844   BUN 21.6 10/19/2015 0903   CREATININE 1.44 (H) 07/30/2019 0835   CREATININE 1.1 04/25/2016 0844   CREATININE 1.3 10/19/2015 0903      Component Value Date/Time   CALCIUM 9.9 07/30/2019 0835   CALCIUM 9.6 04/25/2016 0844   CALCIUM 9.4 10/19/2015 0903   ALKPHOS 52 08/07/2019 0857   ALKPHOS 59 04/25/2016 0844   ALKPHOS 68 10/19/2015 0903   AST 23 08/07/2019 0857   AST 23 07/30/2019 0835   AST 21 10/19/2015 0903   ALT 14 08/07/2019 0857   ALT 14 07/30/2019 0835   ALT 27 04/25/2016 0844   ALT 19 10/19/2015 0903   BILITOT 0.5 08/07/2019 0857   BILITOT 0.6 07/30/2019 0835   BILITOT 0.54 10/19/2015 0903      Impression and Plan: [name] is a 69 year old white male.  We treated him 8 years ago.  He had a marginal zone lymphoma.  He went into clinical remission.  When he initially presented, he had splenomegaly.  This resolved.  For now, we will go ahead and get him set up with some scans.  We will do the scans probably in August before we see him back.  Had believe that everything will continue to improve.  Hopefully, the leg swelling will improve.  He may need a diuretic.  I told him to try some compression stockings but I know this is tough during the summertime.    We will plan to get him back in another 6 weeks.    [name], MD 6/28/20218:47 AM

## 2019-09-17 ENCOUNTER — Other Ambulatory Visit: Payer: Self-pay | Admitting: Internal Medicine

## 2019-09-24 ENCOUNTER — Ambulatory Visit (INDEPENDENT_AMBULATORY_CARE_PROVIDER_SITE_OTHER): Payer: PPO

## 2019-09-24 DIAGNOSIS — Z0000 Encounter for general adult medical examination without abnormal findings: Secondary | ICD-10-CM

## 2019-09-24 NOTE — Patient Instructions (Addendum)
[name] , Thank you for taking time to come for your Medicare Wellness Visit. I appreciate your ongoing commitment to your health goals. Please review the following plan we discussed and let me know if I can assist you in the future.   Screening recommendations/referrals: Colonoscopy: 11/22/2017; due every 5 years Recommended yearly ophthalmology/optometry visit for glaucoma screening and checkup Recommended yearly dental visit for hygiene and checkup  Vaccinations: Influenza vaccine: 12/02/2018 Pneumococcal vaccine: completed Tdap vaccine: 09/07/2010; due every 10 years Shingles vaccine: completed   Covid-19: completed  Advanced directives: Advance directive discussed with you today. I have provided a copy for you to complete at home and have notarized. Once this is complete please bring a copy in to our office so we can scan it into your chart.  Conditions/risks identified: Please continue to do your personal lifestyle choices by: daily care of teeth and gums, regular physical activity (goal should be 5 days a week for 30 minutes), eat a healthy diet, avoid tobacco and drug use, limiting any alcohol intake, taking a low-dose aspirin (if not allergic or have been advised by your provider otherwise) and taking vitamins and minerals as recommended by your provider. Continue doing brain stimulating activities (puzzles, reading, adult coloring books, staying active) to keep memory sharp. Continue to eat heart healthy diet (full of fruits, vegetables, whole grains, lean protein, water--limit salt, fat, and sugar intake) and increase physical activity as tolerated.  Next appointment: Please schedule your next Medicare Wellness Visit with your Nurse Health Advisor in 1 year.  Preventive Care 67 Years and Older, Male Preventive care refers to lifestyle choices and visits with your health care provider that can promote health and wellness. What does preventive care include?  A yearly physical exam.  This is also called an annual well check.  Dental exams once or twice a year.  Routine eye exams. Ask your health care provider how often you should have your eyes checked.  Personal lifestyle choices, including:  Daily care of your teeth and gums.  Regular physical activity.  Eating a healthy diet.  Avoiding tobacco and drug use.  Limiting alcohol use.  Practicing safe sex.  Taking low doses of aspirin every day.  Taking vitamin and mineral supplements as recommended by your health care provider. What happens during an annual well check? The services and screenings done by your health care provider during your annual well check will depend on your age, overall health, lifestyle risk factors, and family history of disease. Counseling  Your health care provider may ask you questions about your:  Alcohol use.  Tobacco use.  Drug use.  Emotional well-being.  Home and relationship well-being.  Sexual activity.  Eating habits.  History of falls.  Memory and ability to understand (cognition).  Work and work Statistician. Screening  You may have the following tests or measurements:  Height, weight, and BMI.  Blood pressure.  Lipid and cholesterol levels. These may be checked every 5 years, or more frequently if you are over 60 years old.  Skin check.  Lung cancer screening. You may have this screening every year starting at age 67 if you have a 30-pack-year history of smoking and currently smoke or have quit within the past 15 years.  Fecal occult blood test (FOBT) of the stool. You may have this test every year starting at age 67.  Flexible sigmoidoscopy or colonoscopy. You may have a sigmoidoscopy every 5 years or a colonoscopy every 10 years starting at age 67.  Prostate cancer screening. Recommendations will vary depending on your family history and other risks.  Hepatitis C blood test.  Hepatitis B blood test.  Sexually transmitted disease (STD)  testing.  Diabetes screening. This is done by checking your blood sugar (glucose) after you have not eaten for a while (fasting). You may have this done every 1-3 years.  Abdominal aortic aneurysm (AAA) screening. You may need this if you are a current or former smoker.  Osteoporosis. You may be screened starting at age 67 if you are at high risk. Talk with your health care provider about your test results, treatment options, and if necessary, the need for more tests. Vaccines  Your health care provider may recommend certain vaccines, such as:  Influenza vaccine. This is recommended every year.  Tetanus, diphtheria, and acellular pertussis (Tdap, Td) vaccine. You may need a Td booster every 10 years.  Zoster vaccine. You may need this after age 67.  Pneumococcal 13-valent conjugate (PCV13) vaccine. One dose is recommended after age 67.  Pneumococcal polysaccharide (PPSV23) vaccine. One dose is recommended after age 67. Talk to your health care provider about which screenings and vaccines you need and how often you need them. This information is not intended to replace advice given to you by your health care provider. Make sure you discuss any questions you have with your health care provider. Document Released: 04/03/2015 Document Revised: 11/25/2015 Document Reviewed: 01/06/2015 Elsevier Interactive Patient Education  2017 [employer] Prevention in the Home Falls can cause injuries. They can happen to people of all ages. There are many things you can do to make your home safe and to help prevent falls. What can I do on the outside of my home?  Regularly fix the edges of walkways and driveways and fix any cracks.  Remove anything that might make you trip as you walk through a door, such as a raised step or threshold.  Trim any bushes or trees on the path to your home.  Use bright outdoor lighting.  Clear any walking paths of anything that might make someone trip, such as  rocks or tools.  Regularly check to see if handrails are loose or broken. Make sure that both sides of any steps have handrails.  Any raised decks and porches should have guardrails on the edges.  Have any leaves, snow, or ice cleared regularly.  Use sand or salt on walking paths during winter.  Clean up any spills in your garage right away. This includes oil or grease spills. What can I do in the bathroom?  Use night lights.  Install grab bars by the toilet and in the tub and shower. Do not use towel bars as grab bars.  Use non-skid mats or decals in the tub or shower.  If you need to sit down in the shower, use a plastic, non-slip stool.  Keep the floor dry. Clean up any water that spills on the floor as soon as it happens.  Remove soap buildup in the tub or shower regularly.  Attach bath mats securely with double-sided non-slip rug tape.  Do not have throw rugs and other things on the floor that can make you trip. What can I do in the bedroom?  Use night lights.  Make sure that you have a light by your bed that is easy to reach.  Do not use any sheets or blankets that are too big for your bed. They should not hang down onto the floor.  Have  a firm chair that has side arms. You can use this for support while you get dressed.  Do not have throw rugs and other things on the floor that can make you trip. What can I do in the kitchen?  Clean up any spills right away.  Avoid walking on wet floors.  Keep items that you use a lot in easy-to-reach places.  If you need to reach something above you, use a strong step stool that has a grab bar.  Keep electrical cords out of the way.  Do not use floor polish or wax that makes floors slippery. If you must use wax, use non-skid floor wax.  Do not have throw rugs and other things on the floor that can make you trip. What can I do with my stairs?  Do not leave any items on the stairs.  Make sure that there are handrails on  both sides of the stairs and use them. Fix handrails that are broken or loose. Make sure that handrails are as long as the stairways.  Check any carpeting to make sure that it is firmly attached to the stairs. Fix any carpet that is loose or worn.  Avoid having throw rugs at the top or bottom of the stairs. If you do have throw rugs, attach them to the floor with carpet tape.  Make sure that you have a light switch at the top of the stairs and the bottom of the stairs. If you do not have them, ask someone to add them for you. What else can I do to help prevent falls?  Wear shoes that:  Do not have high heels.  Have rubber bottoms.  Are comfortable and fit you well.  Are closed at the toe. Do not wear sandals.  If you use a stepladder:  Make sure that it is fully opened. Do not climb a closed stepladder.  Make sure that both sides of the stepladder are locked into place.  Ask someone to hold it for you, if possible.  Clearly mark and make sure that you can see:  Any grab bars or handrails.  First and last steps.  Where the edge of each step is.  Use tools that help you move around (mobility aids) if they are needed. These include:  Canes.  Walkers.  Scooters.  Crutches.  Turn on the lights when you go into a dark area. Replace any light bulbs as soon as they burn out.  Set up your furniture so you have a clear path. Avoid moving your furniture around.  If any of your floors are uneven, fix them.  If there are any pets around you, be aware of where they are.  Review your medicines with your doctor. Some medicines can make you feel dizzy. This can increase your chance of falling. Ask your doctor what other things that you can do to help prevent falls. This information is not intended to replace advice given to you by your health care provider. Make sure you discuss any questions you have with your health care provider. Document Released: 01/01/2009 Document  Revised: 08/13/2015 Document Reviewed: 04/11/2014 Elsevier Interactive Patient Education  2017 [employer].

## 2019-09-24 NOTE — Progress Notes (Signed)
I connected with [name] today by telephone and verified that I am speaking with the correct person using two identifiers. Location patient: home Location provider: work Persons participating in the virtual visit: [name] and Jule Ser. Reah Justo, LPN  I discussed the limitations, risks, security and privacy concerns of performing an evaluation and management service by telephone and the availability of in person appointments. I also discussed with the patient that there may be a patient responsible charge related to this service. The patient expressed understanding and verbally consented to this telephonic visit.    Interactive audio and video telecommunications were attempted between this provider and patient, however failed, due to patient having technical difficulties OR patient did not have access to video capability.  We continued and completed visit with audio only.  Some vital signs may be absent or patient reported.   Time Spent with patient on telephone encounter: 30 minutes  Subjective:   IZAIHA LO is a 69 y.o. male who presents for Medicare Annual/Subsequent preventive examination.  Review of Systems    No ROS. Medicare Wellness Virtual Visit Cardiac Risk Factors include: advanced age (>38men, >73 women);dyslipidemia;family history of premature cardiovascular disease;hypertension;male gender     Objective:    Today's Vitals   09/24/19 1252  PainSc: 0-No pain   There is no height or weight on file to calculate BMI.  Advanced Directives 09/24/2019 09/16/2019 07/30/2019 06/19/2019 05/20/2019 05/13/2019 07/28/2017  Does Patient Have a Medical Advance Directive? No No No No No No No  Would patient like information on creating a medical advance directive? Yes (MAU/Ambulatory/Procedural Areas - Information given) No - Patient declined No - Patient declined No - Patient declined No - Patient declined No - Patient declined -    Current Medications  (verified) Outpatient Encounter Medications as of 09/24/2019  Medication Sig  . amLODipine (NORVASC) 5 MG tablet Take 1 tablet (5 mg total) by mouth daily.  . hydrALAZINE (APRESOLINE) 25 MG tablet Take 1 tablet (25 mg total) by mouth in the morning and at bedtime.  . Ibrutinib (IMBRUVICA) 560 MG TABS Take 560 mg by mouth daily.  Marland Kitchen levothyroxine (SYNTHROID) 150 MCG tablet TAKE 1 TABLET BY MOUTH EVERY DAY  . Multiple Vitamins-Minerals (CENTRUM SILVER 50+MEN PO) Take by mouth.  . rosuvastatin (CRESTOR) 20 MG tablet Take 1 tablet (20 mg total) by mouth daily.   No facility-administered encounter medications on file as of 09/24/2019.    Allergies (verified) Patient has no known allergies.   History: Past Medical History:  Diagnosis Date  . Arthritis   . Hemorrhoids 2014  . Hyperlipidemia   . Hypertension 08/09/2019  . 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 Thyroid disease 2007   hypothyroidism   Past Surgical History:  Procedure Laterality Date  . COLONOSCOPY  2008   Dr Fuller Plan  . COLONOSCOPY W/ POLYPECTOMY  2014   Dr Fuller Plan  . POLYPECTOMY    . [facility] PLACEMENT  Dec 2012   REMOVED spring 2014  . TONSILLECTOMY     Family History  Problem Relation Age of Onset  . Alzheimer's disease Mother   . Heart attack Father 63  . Diabetes Brother   . COPD Brother   . Dementia Brother   . Stroke Brother   . Hypertension Brother         X 3  . Heart disease Paternal Grandfather        in 36s  . Coronary artery disease  Brother        CBAG/vavlve replacement  . Cancer Neg Hx   . Colon cancer Neg Hx   . Colon polyps Neg Hx   . Rectal cancer Neg Hx   . Stomach cancer Neg Hx   . Esophageal cancer Neg Hx    Social History   Socioeconomic History  . Marital status: Married    Spouse name: Not on file  . Number of children: 2  . Years of education: Not on file  . Highest education level: Not on file  Occupational History  . Occupation: Retired     Comment: Airline pilot  Tobacco Use  . Smoking status: Never Smoker  . Smokeless tobacco: Never Used  . Tobacco comment: NEVER USED TOBACCO  Substance and Sexual Activity  . Alcohol use: Yes    Alcohol/week: 0.0 standard drinks    Comment:  1-2 wine / week  . Drug use: No  . Sexual activity: Not on file  Other Topics Concern  . Not on file  Social History Narrative   Exercise:  No regular exercise, active, yardwork   Social Determinants of Health   Financial Resource Strain: Low Risk   . Difficulty of Paying Living Expenses: Not hard at all  Food Insecurity: No Food Insecurity  . Worried About Charity fundraiser in the Last Year: Never true  . Ran Out of Food in the Last Year: Never true  Transportation Needs: No Transportation Needs  . Lack of Transportation (Medical): No  . Lack of Transportation (Non-Medical): No  Physical Activity: Sufficiently Active  . Days of Exercise per Week: 3 days  . Minutes of Exercise per Session: 60 min  Stress: No Stress Concern Present  . Feeling of Stress : Not at all  Social Connections: Moderately Integrated  . Frequency of Communication with Friends and Family: More than three times a week  . Frequency of Social Gatherings with Friends and Family: More than three times a week  . Attends Religious Services: Never  . Active Member of Clubs or Organizations: Yes  . Attends Archivist Meetings: More than 4 times per year  . Marital Status: Married    Tobacco Counseling Counseling given: Not Answered Comment: NEVER USED TOBACCO   Clinical Intake:  Pre-visit preparation completed: Yes  Pain : No/denies pain Pain Score: 0-No pain     Nutritional Risks: None Diabetes: No  How often do you need to have someone help you when you read instructions, pamphlets, or other written materials from your doctor or pharmacy?: 1 - Never What is the last grade level you completed in school?: HSG; 1 years of community  college  Diabetic? no  Interpreter Needed?: No  Information entered by :: Jordanny Waddington N. Rie Mcneil, LPN   Activities of Daily Living In your present state of health, do you have any difficulty performing the following activities: 09/24/2019  Hearing? N  Vision? N  Difficulty concentrating or making decisions? N  Walking or climbing stairs? N  Dressing or bathing? N  Doing errands, shopping? N  Preparing Food and eating ? N  Using the Toilet? N  In the past six months, have you accidently leaked urine? Y  Managing your Medications? N  Managing your Finances? N  Housekeeping or managing your Housekeeping? N  Some recent data might be hidden    Patient Care Team: Binnie Rail, MD as PCP - General (Internal Medicine) Marin Olp Rudell Cobb, MD as Consulting Physician (Oncology)  Indicate any  recent Medical Services you may have received from other than [facility] providers in the past year (date may be approximate).     Assessment:   This is a routine wellness examination for Rosita.  Hearing/Vision screen No exam data present  Dietary issues and exercise activities discussed: Current Exercise Habits: Home exercise routine, Type of exercise: walking, Time (Minutes): 60, Frequency (Times/Week): 3, Weekly Exercise (Minutes/Week): 180, Intensity: Moderate, Exercise limited by: None identified  Goals    .  Blood Pressure < 130/80    .  Patient Stated (pt-stated)      To get through this lymphoma and hope to change the dosage on the chemo drugs.       Depression Screen PHQ 2/9 Scores 09/24/2019 04/19/2019 04/11/2018 03/20/2017 02/25/2011  PHQ - 2 Score 0 0 0 0 0    Fall Risk Fall Risk  09/24/2019 04/19/2019 04/11/2018 03/20/2017 10/19/2015  Falls in the past year? 0 0 1 Yes No  Number falls in past yr: 0 0 0 1 -  Injury with Fall? 0 - 0 No -  Risk for fall due to : No Fall Risks - - History of fall(s) -  Follow up Falls evaluation completed - - Falls prevention discussed -    Any stairs in  or around the home? Yes  If so, are there any without handrails? No Home free of loose throw rugs in walkways, pet beds, electrical cords, etc? Yes  Adequate lighting in your home to reduce risk of falls? Yes   ASSISTIVE DEVICES UTILIZED TO PREVENT FALLS:  Life alert? No  Use of a cane, walker or w/c? No  Grab bars in the bathroom? No  Shower chair or bench in shower? No  Elevated toilet seat or a handicapped toilet? No   TIMED UP AND GO:  Was the test performed? No .  Length of time to ambulate 10 feet: 0 sec.   Gait steady and fast without use of assistive device  Cognitive Function:        Immunizations Immunization History  Administered Date(s) Administered  . Fluad Quad(high Dose 65+) 12/02/2018  . Influenza Inj Mdck Quad Pf 12/21/2016  . Influenza Whole 01/05/2010  . Influenza, High Dose Seasonal PF 12/02/2018  . Influenza,inj,Quad PF,6+ Mos 12/19/2012  . Influenza-Unspecified 11/25/2013, 12/17/2014, 12/10/2015, 12/21/2016, 11/07/2017, 12/02/2018  . PFIZER SARS-COV-2 Vaccination 05/11/2019, 06/04/2019  . Pneumococcal Conjugate-13 11/28/2014  . Pneumococcal Polysaccharide-23 12/16/2013, 04/19/2019  . Tdap 09/07/2010  . Zoster Recombinat (Shingrix) 12/21/2016, 05/19/2017    TDAP status: Up to date Flu Vaccine status: Up to date Pneumococcal vaccine status: Up to date Covid-19 vaccine status: Completed vaccines  Qualifies for Shingles Vaccine? Yes   Zostavax completed No   Shingrix Completed?: Yes  Screening Tests Health Maintenance  Topic Date Due  . INFLUENZA VACCINE  10/20/2019  . TETANUS/TDAP  09/06/2020  . COLONOSCOPY  11/23/2022  . COVID-19 Vaccine  Completed  . Hepatitis C Screening  Completed  . PNA vac Low Risk Adult  Completed    Health Maintenance  There are no preventive care reminders to display for this patient.  Colorectal cancer screening: Completed 11/22/2017. Repeat every 5 years  Lung Cancer Screening: (Low Dose CT Chest  recommended if Age 79-80 years, 30 pack-year currently smoking OR have quit w/in 15years.) does not qualify.   Lung Cancer Screening Referral: no  Additional Screening:  Hepatitis C Screening: does qualify; Completed yes  Vision Screening: Recommended annual ophthalmology exams for early detection of glaucoma and  other disorders of the eye. Is the patient up to date with their annual eye exam?  Yes  Who is the provider or what is the name of the office in which the patient attends annual eye exams? [facility] If pt is not established with a provider, would they like to be referred to a provider to establish care? No .   Dental Screening: Recommended annual dental exams for proper oral hygiene  Community Resource Referral / Chronic Care Management: CRR required this visit?  No   CCM required this visit?  No      Plan:     I have personally reviewed and noted the following in the patient's chart:   . Medical and social history . Use of alcohol, tobacco or illicit drugs  . Current medications and supplements . Functional ability and status . Nutritional status . Physical activity . Advanced directives . List of other physicians . Hospitalizations, surgeries, and ER visits in previous 12 months . Vitals . Screenings to include cognitive, depression, and falls . Referrals and appointments  In addition, I have reviewed and discussed with patient certain preventive protocols, quality metrics, and best practice recommendations. A written personalized care plan for preventive services as well as general preventive health recommendations were provided to patient.     Sheral Flow, LPN   06/20/7060   Nurse Notes:  Patient is cogitatively intact. There were no vitals filed for this visit. There is no height or weight on file to calculate BMI. Patient stated that he has no issues with gait or balance; does not use any assistive devices.

## 2019-10-02 ENCOUNTER — Other Ambulatory Visit: Payer: Self-pay | Admitting: Cardiology

## 2019-10-03 ENCOUNTER — Other Ambulatory Visit: Payer: Self-pay | Admitting: Internal Medicine

## 2019-10-10 ENCOUNTER — Ambulatory Visit (INDEPENDENT_AMBULATORY_CARE_PROVIDER_SITE_OTHER): Payer: PPO | Admitting: Pharmacist

## 2019-10-10 ENCOUNTER — Other Ambulatory Visit: Payer: Self-pay

## 2019-10-10 VITALS — BP 138/70 | HR 75 | Resp 16 | Ht 71.0 in | Wt 212.0 lb

## 2019-10-10 DIAGNOSIS — I159 Secondary hypertension, unspecified: Secondary | ICD-10-CM

## 2019-10-10 MED ORDER — HYDRALAZINE HCL 25 MG PO TABS: 25.0000 mg | ORAL_TABLET | Freq: Two times a day (BID) | ORAL | 3 refills | Status: DC

## 2019-10-10 MED ORDER — HYDRALAZINE HCL 25 MG PO TABS: 50.0000 mg | ORAL_TABLET | Freq: Two times a day (BID) | ORAL | 3 refills | Status: DC

## 2019-10-10 NOTE — Patient Instructions (Addendum)
Return for a  follow up appointment in 4-6 weeks in office (3 weeks by phone)  Check your blood pressure at home daily (if able) and keep record of the readings.  Take your BP meds as follows: *INCREASE hydralazine to 50mg  twice daily   Exercise as you're able, try to walk approximately 30 minutes per day.  Keep salt intake to a minimum, especially watch canned and prepared boxed foods.  Eat more fresh fruits and vegetables and fewer canned items.  Avoid eating in fast food restaurants.    HOW TO TAKE YOUR BLOOD PRESSURE: . Rest 5 minutes before taking your blood pressure. .  Don't smoke or drink caffeinated beverages for at least 30 minutes before. . Take your blood pressure before (not after) you eat. . Sit comfortably with your back supported and both feet on the floor (don't cross your legs). . Elevate your arm to heart level on a table or a desk. . Use the proper sized cuff. It should fit smoothly and snugly around your bare upper arm. There should be enough room to slip a fingertip under the cuff. The bottom edge of the cuff should be 1 inch above the crease of the elbow. . Ideally, take 3 measurements at one sitting and record the average.

## 2019-10-10 NOTE — Progress Notes (Signed)
Patient ID: [name] [name]                 DOB: 05-05-1950                      MRN: [id number]     HPI: [name] [name] is a 69 y.o. male referred by Dr. Martinique to HTN clinic. PMH includes hyperlipidemia, and hypothyroidism. He is also undergoing therapy for lymphoma with Imbruvica. [name] [name] is know to cause HTN in up to 20% of cases. Hydralazine was added to to his medication list during last OV. He experienced worsening LEE with amlodipine 7.5mg , somehow improved since decreasing dose back to 5mg  daily. His chemotherapy is know to cause LEE as well, but patient tolerating so far.  Patient presents today to clinic for follow up and denies increased fatigue, dizziness, SOB, headaches, chest pain, or blurry vision. Stable on current chemotherapy regimen and plan to continue until the end of the year.  Current HTN meds:  Amlodipine 5mg  daily  Hydralazine 25mg  BID  Previously tried: none  BP goal: 130/80  Family History: The patient's family history includes Alzheimer's disease in his mother; COPD in his brother; Coronary artery disease in his brother; Dementia in his brother; Diabetes in his brother; Heart attack (age of onset: 65) in his father; Heart disease in his paternal grandfather; Hypertension in his brother; Stroke in his brother.   Social History: The patient  reports that he has never smoked. He has never used smokeless tobacco. He reports current alcohol use. He reports that he does not use drugs  Diet: avoid added salt but working on decreasing sodium in diet and eating health food  Exercise: activities of daily living  Home BP readings:  24 readings; range 128/79 to 165/70 (average 150/76). HR range 64 to 76bpm  *Home BP cuff is accurate for his systolic within 26STMH from manual reading using correct technique*  Wt Readings from Last 3 Encounters:  10/10/19 (!) 212 lb (96.2 kg)  09/16/19 214 lb 12 oz (97.4 kg)  09/10/19 215 lb 9.6 oz (97.8 kg)   BP Readings from Last 3  Encounters:  10/10/19 (!) 138/70  09/16/19 (!) 151/73  09/10/19 134/82   Pulse Readings from Last 3 Encounters:  10/10/19 75  09/16/19 70  08/06/19 68     Past Medical History:  Diagnosis Date  . Arthritis   . Hemorrhoids 2014  . Hyperlipidemia   . Hypertension 08/09/2019  . 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 Thyroid disease 2007   hypothyroidism    Current Outpatient Medications on File Prior to Visit  Medication Sig Dispense Refill  . amLODipine (NORVASC) 5 MG tablet Take 1 tablet (5 mg total) by mouth daily. 30 tablet 3  . Ibrutinib (IMBRUVICA) 560 MG TABS Take 560 mg by mouth daily. 30 tablet 6  . levothyroxine (SYNTHROID) 150 MCG tablet TAKE 1 TABLET BY MOUTH EVERY DAY 90 tablet 1  . Multiple Vitamins-Minerals (CENTRUM SILVER 50+MEN PO) Take by mouth.    . rosuvastatin (CRESTOR) 20 MG tablet Take 1 tablet (20 mg total) by mouth daily. 90 tablet 1   No current facility-administered medications on file prior to visit.    No Known Allergies  Blood pressure (!) 138/70, pulse 75, resp. rate 16, height 5\' 11"  (1.803 m), weight (!) 212 lb (96.2 kg), SpO2 95 %.  Hypertension Blood pressure better controlled but remains above goal. Patient currently tolerating therapy. Will  increase hydralazine to 50mg  twice daily and continue amlodipine 5mg  daily. Plan to change amlodipine to candesartan if LEE worsen. May continue o titrate hydralazine during next OV if additional BP control needed and LEE remains under control.    Jozie Wulf Rodriguez-Guzman PharmD, BCPS, [place] [address] [place] [id number] 10/16/2019 10:50 AM

## 2019-10-11 ENCOUNTER — Telehealth: Payer: Self-pay | Admitting: *Deleted

## 2019-10-11 ENCOUNTER — Ambulatory Visit (HOSPITAL_BASED_OUTPATIENT_CLINIC_OR_DEPARTMENT_OTHER)
Admission: RE | Admit: 2019-10-11 | Discharge: 2019-10-11 | Disposition: A | Discharge status: Home / Self Care | Payer: PPO | Source: Ambulatory Visit | Attending: Hematology & Oncology | Admitting: Hematology & Oncology

## 2019-10-11 ENCOUNTER — Encounter (HOSPITAL_BASED_OUTPATIENT_CLINIC_OR_DEPARTMENT_OTHER): Payer: Self-pay

## 2019-10-11 DIAGNOSIS — C8583 Other specified types of non-Hodgkin lymphoma, intra-abdominal lymph nodes: Secondary | ICD-10-CM | POA: Diagnosis not present

## 2019-10-11 DIAGNOSIS — K5730 Diverticulosis of large intestine without perforation or abscess without bleeding: Secondary | ICD-10-CM | POA: Diagnosis not present

## 2019-10-11 DIAGNOSIS — C8590 Non-Hodgkin lymphoma, unspecified, unspecified site: Secondary | ICD-10-CM | POA: Diagnosis not present

## 2019-10-11 DIAGNOSIS — K449 Diaphragmatic hernia without obstruction or gangrene: Secondary | ICD-10-CM | POA: Diagnosis not present

## 2019-10-11 DIAGNOSIS — J479 Bronchiectasis, uncomplicated: Secondary | ICD-10-CM | POA: Diagnosis not present

## 2019-10-11 MED ORDER — IOHEXOL 300 MG/ML  SOLN
100.0000 mL | Freq: Once | INTRAMUSCULAR | Status: AC | PRN
  Administered 2019-10-11: 100 mL via INTRAVENOUS

## 2019-10-11 NOTE — Telephone Encounter (Signed)
-----   Message from [name], MD sent at 10/11/2019  3:23 PM EDT ----- Call - NO abnormal lymph nodes!!  No enlarged spleen!!  Great job!!  Laurey Arrow

## 2019-10-11 NOTE — Telephone Encounter (Signed)
Message left on patients private cell phone to notify him per order of Dr. Marin Olp that there are "NO abnormal lymph nodes!!  No enlarged spleen!!  Great job!! [name] [name]"  Instructed pt to call office back with any questions or concerns.

## 2019-10-16 NOTE — Assessment & Plan Note (Signed)
Blood pressure better controlled but remains above goal. Patient currently tolerating therapy. Will increase hydralazine to 50mg  twice daily and continue amlodipine 5mg  daily. Plan to change amlodipine to candesartan if LEE worsen. May continue o titrate hydralazine during next OV if additional BP control needed and LEE remains under control.

## 2019-10-29 ENCOUNTER — Inpatient Hospital Stay: Payer: PPO | Attending: Hematology & Oncology

## 2019-10-29 ENCOUNTER — Encounter: Payer: Self-pay | Admitting: Hematology & Oncology

## 2019-10-29 ENCOUNTER — Other Ambulatory Visit: Payer: Self-pay

## 2019-10-29 ENCOUNTER — Inpatient Hospital Stay (HOSPITAL_BASED_OUTPATIENT_CLINIC_OR_DEPARTMENT_OTHER): Payer: PPO | Admitting: Hematology & Oncology

## 2019-10-29 VITALS — BP 140/75 | HR 73 | Temp 98.4°F | Resp 18 | Ht 71.0 in | Wt 215.8 lb

## 2019-10-29 DIAGNOSIS — C8583 Other specified types of non-Hodgkin lymphoma, intra-abdominal lymph nodes: Secondary | ICD-10-CM

## 2019-10-29 DIAGNOSIS — M62838 Other muscle spasm: Secondary | ICD-10-CM | POA: Insufficient documentation

## 2019-10-29 DIAGNOSIS — M7989 Other specified soft tissue disorders: Secondary | ICD-10-CM | POA: Diagnosis not present

## 2019-10-29 LAB — CMP (CANCER CENTER ONLY)
ALT: 14 U/L (ref 0–44)
AST: 24 U/L (ref 15–41)
Albumin: 3.9 g/dL (ref 3.5–5.0)
Alkaline Phosphatase: 53 U/L (ref 38–126)
Anion gap: 6 (ref 5–15)
BUN: 24 mg/dL — ABNORMAL HIGH (ref 8–23)
CO2: 28 mmol/L (ref 22–32)
Calcium: 9.7 mg/dL (ref 8.9–10.3)
Chloride: 104 mmol/L (ref 98–111)
Creatinine: 1.49 mg/dL — ABNORMAL HIGH (ref 0.61–1.24)
GFR, Est AFR Am: 55 mL/min — ABNORMAL LOW (ref >60)
GFR, Estimated: 48 mL/min — ABNORMAL LOW (ref >60)
Glucose, Bld: 94 mg/dL (ref 70–99)
Potassium: 4.2 mmol/L (ref 3.5–5.1)
Sodium: 138 mmol/L (ref 135–145)
Total Bilirubin: 0.6 mg/dL (ref 0.3–1.2)
Total Protein: 7.1 g/dL (ref 6.5–8.1)

## 2019-10-29 LAB — CBC WITH DIFFERENTIAL (CANCER CENTER ONLY)
Abs Immature Granulocytes: 0.16 10*3/uL — ABNORMAL HIGH (ref 0.00–0.07)
Basophils Absolute: 0.2 10*3/uL — ABNORMAL HIGH (ref 0.0–0.1)
Basophils Relative: 1 %
Eosinophils Absolute: 0.1 10*3/uL (ref 0.0–0.5)
Eosinophils Relative: 1 %
HCT: 40.4 % (ref 39.0–52.0)
Hemoglobin: 12.9 g/dL — ABNORMAL LOW (ref 13.0–17.0)
Immature Granulocytes: 1 %
Lymphocytes Relative: 71 %
Lymphs Abs: 12.5 10*3/uL — ABNORMAL HIGH (ref 0.7–4.0)
MCH: 29.5 pg (ref 26.0–34.0)
MCHC: 31.9 g/dL (ref 30.0–36.0)
MCV: 92.2 fL (ref 80.0–100.0)
Monocytes Absolute: 0.7 10*3/uL (ref 0.1–1.0)
Monocytes Relative: 4 %
Neutro Abs: 3.8 10*3/uL (ref 1.7–7.7)
Neutrophils Relative %: 22 %
Platelet Count: 103 10*3/uL — ABNORMAL LOW (ref 150–400)
RBC: 4.38 MIL/uL (ref 4.22–5.81)
RDW: 13.3 % (ref 11.5–15.5)
WBC Count: 17.4 10*3/uL — ABNORMAL HIGH (ref 4.0–10.5)
nRBC: 0 % (ref 0.0–0.2)

## 2019-10-29 LAB — LACTATE DEHYDROGENASE: LDH: 355 U/L — ABNORMAL HIGH (ref 98–192)

## 2019-10-29 MED ORDER — CYCLOBENZAPRINE HCL 10 MG PO TABS: 10.0000 mg | ORAL_TABLET | Freq: Three times a day (TID) | ORAL | 2 refills | Status: DC | PRN

## 2019-10-29 NOTE — Progress Notes (Signed)
Hematology and Oncology Follow Up Visit  [name] [name] [id number] Jan 29, 1951 69 y.o. 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  He is doing quite well.  The big problem right now that he has been having some muscle spasms.  Starting he had a bad and spasm on the inner left thigh.  He was just sitting down when this happened.  It was quite significant.  I will send in some Flexeril (10 mg p.o. every 8 hours as needed) and see if this can help if he does have a spasm.  Provide also told him to make sure he stays well-hydrated.  If he wants to take some magnesium oxide at nighttime this may also help a little bit.  We did go ahead and do scans on him.  The CT scans show that he has had a very nice response.  There is no significant adenopathy that was noted on his scans.  He is doing well with the Imbruvica.  It is hard to say if the spasms were from the [place] but certainly could be.  He has had his white cell count come down nicely.  Hopefully, we will be able to decrease his dose of Imbruvica in the future.  He has had no fever.  He has had some bleeding.  He says he gets these little lesions on his arms and they tend open up on him.  He has had no change in bowel or bladder habits.  He has had no cough.  He has had no chest wall pain or shortness of breath.  He does have some swelling in the lower legs.  This might be from amlodipine that he takes for blood pressure.  Overall, his performance status is ECOG 0.    Medications:  Current Outpatient Medications:  .  amLODipine (NORVASC) 5 MG tablet, Take 1 tablet (5 mg total) by mouth daily., Disp: 30 tablet, Rfl: 3 .  hydrALAZINE (APRESOLINE) 25 MG tablet, Take 2 tablets (50 mg total) by mouth in the morning and at bedtime., Disp: 180  tablet, Rfl: 3 .  Ibrutinib (IMBRUVICA) 560 MG TABS, Take 560 mg by mouth daily., Disp: 30 tablet, Rfl: 6 .  levothyroxine (SYNTHROID) 150 MCG tablet, TAKE 1 TABLET BY MOUTH EVERY DAY, Disp: 90 tablet, Rfl: 1 .  Multiple Vitamins-Minerals (CENTRUM SILVER 50+MEN PO), Take by mouth., Disp: , Rfl:  .  rosuvastatin (CRESTOR) 20 MG tablet, Take 1 tablet (20 mg total) by mouth daily., Disp: 90 tablet, Rfl: 1  Allergies: No Known Allergies  Past Medical History, Surgical history, Social history, and Family History were reviewed and updated.  Review of Systems: Review of Systems  Constitutional: Negative.   HENT:  Negative.   Eyes: Negative.   Respiratory: Negative.   Cardiovascular: Negative.   Gastrointestinal: Positive for abdominal pain and nausea.  Endocrine: Negative.   Genitourinary: Negative.    Musculoskeletal: Negative.   Skin: Negative.   Neurological: Negative.   Hematological: Negative.   Psychiatric/Behavioral: Negative.     Physical Exam:  height is 5\' 11"  (1.803 m) and weight is 215 lb 12.8 oz (97.9 kg). His oral temperature is 98.4 F (36.9 C). His blood pressure is 140/75 and his pulse is 73. His respiration is 18 and oxygen saturation is  98%.   Wt Readings from Last 3 Encounters:  10/29/19 215 lb 12.8 oz (97.9 kg)  10/10/19 (!) 212 lb (96.2 kg)  09/16/19 214 lb 12 oz (97.4 kg)    Physical Exam Vitals reviewed.  HENT:     Head: Normocephalic and atraumatic.  Eyes:     Pupils: Pupils are equal, round, and reactive to light.  Cardiovascular:     Rate and Rhythm: Normal rate and regular rhythm.     Heart sounds: Normal heart sounds.  Pulmonary:     Effort: Pulmonary effort is normal.     Breath sounds: Normal breath sounds.  Abdominal:     General: Bowel sounds are normal.     Palpations: Abdomen is soft.  Musculoskeletal:        General: No tenderness or deformity. Normal range of motion.     Cervical back: Normal range of motion.  Lymphadenopathy:      Cervical: No cervical adenopathy.  Skin:    General: Skin is warm and dry.     Findings: No erythema or rash.  Neurological:     Mental Status: He is alert and oriented to person, place, and time.  Psychiatric:        Behavior: Behavior normal.        Thought Content: Thought content normal.        Judgment: Judgment normal.      Lab Results  Component Value Date   WBC 17.4 (H) 10/29/2019   HGB 12.9 (L) 10/29/2019   HCT 40.4 10/29/2019   MCV 92.2 10/29/2019   PLT 103 (L) 10/29/2019     Chemistry      Component Value Date/Time   NA 138 10/29/2019 1030   NA 142 04/25/2016 0844   NA 140 10/19/2015 0903   K 4.2 10/29/2019 1030   K 4.1 04/25/2016 0844   K 4.1 10/19/2015 0903   CL 104 10/29/2019 1030   CL 102 04/25/2016 0844   CO2 28 10/29/2019 1030   CO2 29 04/25/2016 0844   CO2 25 10/19/2015 0903   BUN 24 (H) 10/29/2019 1030   BUN 14 04/25/2016 0844   BUN 21.6 10/19/2015 0903   CREATININE 1.49 (H) 10/29/2019 1030   CREATININE 1.1 04/25/2016 0844   CREATININE 1.3 10/19/2015 0903      Component Value Date/Time   CALCIUM 9.7 10/29/2019 1030   CALCIUM 9.6 04/25/2016 0844   CALCIUM 9.4 10/19/2015 0903   ALKPHOS 53 10/29/2019 1030   ALKPHOS 59 04/25/2016 0844   ALKPHOS 68 10/19/2015 0903   AST 24 10/29/2019 1030   AST 21 10/19/2015 0903   ALT 14 10/29/2019 1030   ALT 27 04/25/2016 0844   ALT 19 10/19/2015 0903   BILITOT 0.6 10/29/2019 1030   BILITOT 0.54 10/19/2015 0903      Impression and Plan: [name] [name] is a 69 year old white male.  We treated him 8 years ago.  He had a marginal zone lymphoma.  He went into clinical remission.  When he initially presented, he had splenomegaly.  This resolved.  He is wife will be going to the beach in September.  I told him to would make sure that they stay well-hydrated and put on a lot of high level sunscreen.  They will have very fair skin.  I do not think we have to do any scans on him probably until December.  If we find  that his white cell count continues to improve we see him back in 2  months, then we will decrease his dose of Imbruvica down to 420 mg a day.      [name], MD 8/10/202111:21 AM

## 2019-11-01 ENCOUNTER — Telehealth: Payer: Self-pay | Admitting: Pharmacist

## 2019-11-01 NOTE — Telephone Encounter (Signed)
LMOM; patient to call back and let us know if tolerating hydralazine dose increase. Noted f/u HTN clinic on 11/21/2019

## 2019-11-04 ENCOUNTER — Telehealth: Payer: Self-pay

## 2019-11-04 MED ORDER — HYDRALAZINE HCL 50 MG PO TABS: 50.0000 mg | ORAL_TABLET | Freq: Three times a day (TID) | ORAL | 1 refills | Status: DC

## 2019-11-04 NOTE — Telephone Encounter (Signed)
Pt calling to speak w/raquel regarding medications. I instructed the pt that she would call him back by the end of the clinic day. Will route to raquel pharmd.

## 2019-11-04 NOTE — Telephone Encounter (Signed)
BP remains above the same , LEE stable.  Will increase hydralazine to 50mg  TID until next f/u in 2 weeks

## 2019-11-05 ENCOUNTER — Other Ambulatory Visit: Payer: Self-pay | Admitting: *Deleted

## 2019-11-05 ENCOUNTER — Encounter: Payer: Self-pay | Admitting: Hematology & Oncology

## 2019-11-05 MED ORDER — PROCHLORPERAZINE MALEATE 10 MG PO TABS
10.0000 mg | ORAL_TABLET | Freq: Four times a day (QID) | ORAL | 1 refills | Status: DC | PRN
Start: 2019-11-05 — End: 2021-08-03

## 2019-11-08 ENCOUNTER — Other Ambulatory Visit: Payer: Self-pay | Admitting: *Deleted

## 2019-11-08 MED ORDER — IMBRUVICA 560 MG PO TABS: 560.0000 mg | ORAL_TABLET | Freq: Every day | ORAL | 11 refills | Status: DC

## 2019-11-13 ENCOUNTER — Encounter: Payer: Self-pay | Admitting: Hematology & Oncology

## 2019-11-17 ENCOUNTER — Other Ambulatory Visit: Payer: Self-pay | Admitting: Internal Medicine

## 2019-11-18 ENCOUNTER — Other Ambulatory Visit: Payer: Self-pay | Admitting: Pharmacist

## 2019-11-18 ENCOUNTER — Other Ambulatory Visit: Payer: Self-pay

## 2019-11-18 MED ORDER — HYDRALAZINE HCL 50 MG PO TABS
50.0000 mg | ORAL_TABLET | Freq: Three times a day (TID) | ORAL | 1 refills | Status: DC
Start: 2019-11-18 — End: 2020-02-11

## 2019-11-20 ENCOUNTER — Other Ambulatory Visit: Payer: Self-pay

## 2019-11-20 DIAGNOSIS — C8583 Other specified types of non-Hodgkin lymphoma, intra-abdominal lymph nodes: Secondary | ICD-10-CM

## 2019-11-20 MED ORDER — IMBRUVICA 560 MG PO TABS: 560.0000 mg | ORAL_TABLET | Freq: Every day | ORAL | 11 refills | Status: DC

## 2019-11-21 ENCOUNTER — Ambulatory Visit (INDEPENDENT_AMBULATORY_CARE_PROVIDER_SITE_OTHER): Payer: PPO | Admitting: Pharmacist Clinician (PhC)/ Clinical Pharmacy Specialist

## 2019-11-21 ENCOUNTER — Other Ambulatory Visit: Payer: Self-pay

## 2019-11-21 VITALS — BP 152/78 | HR 82 | Resp 16 | Ht 71.0 in | Wt 214.0 lb

## 2019-11-21 DIAGNOSIS — I159 Secondary hypertension, unspecified: Secondary | ICD-10-CM | POA: Diagnosis not present

## 2019-11-21 MED ORDER — VALSARTAN 160 MG PO TABS: 160.0000 mg | ORAL_TABLET | Freq: Every day | ORAL | 5 refills | Status: DC

## 2019-11-21 NOTE — Progress Notes (Signed)
Patient ID: [name] [name]                 DOB: Aug 04, 1950                      MRN: [id number]     HPI: [name] [name] is a 69 y.o. male referred by Dr. Martinique to HTN clinic. PMH includes hyperlipidemia, and hypothyroidism. He is also undergoing therapy for lymphoma with Imbruvica. [name] is know to cause HTN in up to 20% of cases. Hydralazine was added to to his medication list during last OV. He experienced worsening LEE with amlodipine 7.5mg , somehow improved since decreasing dose back to 5mg  daily. His chemotherapy is know to cause LEE as well, but patient tolerating so far.  Patient presents today to clinic for follow up and denies increased fatigue, dizziness, SOB, headaches, chest pain, or blurry vision. Stable on current chemotherapy regimen and plan to continue at least thru the end of the year.    Imbruvica thru 2021  Current HTN meds:  Amlodipine 5mg  daily PM Hydralazine 50 mg TID  Previously tried: none  BP goal: 130/80  Family History: The patient's family history includes Alzheimer's disease in his mother; COPD in his brother; Coronary artery disease in his brother; Dementia in his brother; Diabetes in his brother; Heart attack (age of onset: 11) in his father; Heart disease in his paternal grandfather; Hypertension in his brother; Stroke in his brother.   Social History: The patient  reports that he has never smoked. He has never used smokeless tobacco. He reports current alcohol use. He reports that he does not use drugs  Diet: avoid added salt but working on decreasing sodium in diet and eating health food  Exercise: activities of daily living  Home BP readings:  28 readings range 138-158/70-80  (average 149/75).  HR range 64-77 bpm 24 readings; range 128/79 to 165/70 (average 150/76). HR range 64 to 76bpm  *Home BP cuff is accurate for his systolic within 85IOEV from manual reading using correct technique*  Wt Readings from Last 3 Encounters:  11/21/19 214 lb  (97.1 kg)  10/29/19 215 lb 12.8 oz (97.9 kg)  10/10/19 (!) 212 lb (96.2 kg)   BP Readings from Last 3 Encounters:  11/21/19 (!) 152/78  10/29/19 140/75  10/10/19 (!) 138/70   Pulse Readings from Last 3 Encounters:  11/21/19 82  10/29/19 73  10/10/19 75     Past Medical History:  Diagnosis Date  . Arthritis   . Hemorrhoids 2014  . Hyperlipidemia   . Hypertension 08/09/2019  . 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 Thyroid disease 2007   hypothyroidism    Current Outpatient Medications on File Prior to Visit  Medication Sig Dispense Refill  . amLODipine (NORVASC) 5 MG tablet Take 1 tablet (5 mg total) by mouth daily. 30 tablet 3  . cyclobenzaprine (FLEXERIL) 10 MG tablet Take 1 tablet (10 mg total) by mouth 3 (three) times daily as needed for muscle spasms. 30 tablet 2  . hydrALAZINE (APRESOLINE) 50 MG tablet Take 1 tablet (50 mg total) by mouth 3 (three) times daily. 270 tablet 1  . Ibrutinib (IMBRUVICA) 560 MG TABS Take 560 mg by mouth daily. 30 tablet 11  . levothyroxine (SYNTHROID) 150 MCG tablet TAKE 1 TABLET BY MOUTH EVERY DAY 90 tablet 1  . Multiple Vitamins-Minerals (CENTRUM SILVER 50+MEN PO) Take by mouth.    . prochlorperazine (COMPAZINE) 10 MG tablet  Take 1 tablet (10 mg total) by mouth every 6 (six) hours as needed for nausea or vomiting. 30 tablet 1  . rosuvastatin (CRESTOR) 20 MG tablet TAKE 1 TABLET BY MOUTH EVERY DAY 90 tablet 1   No current facility-administered medications on file prior to visit.    No Known Allergies  Blood pressure (!) 152/78, pulse 82, resp. rate 16, height 5\' 11"  (1.803 m), weight 214 lb (97.1 kg), SpO2 97 %.  Hypertension Patient with chemotherapy (Imbruvica) induced hypertension, still running higher that would like.  Patient expects will be on Imbruvica thru at least the end of 2020.  Will add valsartan 80 mg daily to his current regimen and repeat BMP in 2 weeks to be sure no bump in SCr.  Current  SCr at 1.49 with GFR at 48.  He is to continue with regular home BP monitoring and we will see him back in 4 weeks.     [name] PharmD CPP [facility] [place] [id number] 11/22/2019 7:27 AM

## 2019-11-21 NOTE — Patient Instructions (Signed)
   Go to the lab in 2 weeks to check kidney function (Around Sept 14-17)   Check your blood pressure at home daily and keep record of the readings.  Take your BP meds as follows:   Start valsartan 160 mg once daily in the mornings  Continue with all other medications  Bring all of your meds, your BP cuff and your record of home blood pressures to your next appointment.  Exercise as you're able, try to walk approximately 30 minutes per day.  Keep salt intake to a minimum, especially watch canned and prepared boxed foods.  Eat more fresh fruits and vegetables and fewer canned items.  Avoid eating in fast food restaurants.    HOW TO TAKE YOUR BLOOD PRESSURE: . Rest 5 minutes before taking your blood pressure. .  Don't smoke or drink caffeinated beverages for at least 30 minutes before. . Take your blood pressure before (not after) you eat. . Sit comfortably with your back supported and both feet on the floor (don't cross your legs). . Elevate your arm to heart level on a table or a desk. . Use the proper sized cuff. It should fit smoothly and snugly around your bare upper arm. There should be enough room to slip a fingertip under the cuff. The bottom edge of the cuff should be 1 inch above the crease of the elbow. . Ideally, take 3 measurements at one sitting and record the average.

## 2019-11-22 ENCOUNTER — Encounter: Payer: Self-pay | Admitting: Pharmacist Clinician (PhC)/ Clinical Pharmacy Specialist

## 2019-11-22 NOTE — Assessment & Plan Note (Signed)
Patient with chemotherapy (Imbruvica) induced hypertension, still running higher that would like.  Patient expects will be on Imbruvica thru at least the end of 2020.  Will add valsartan 80 mg daily to his current regimen and repeat BMP in 2 weeks to be sure no bump in SCr.  Current SCr at 1.49 with GFR at 48.  He is to continue with regular home BP monitoring and we will see him back in 4 weeks.

## 2019-12-05 DIAGNOSIS — Z85820 Personal history of malignant melanoma of skin: Secondary | ICD-10-CM | POA: Diagnosis not present

## 2019-12-05 DIAGNOSIS — Z08 Encounter for follow-up examination after completed treatment for malignant neoplasm: Secondary | ICD-10-CM | POA: Diagnosis not present

## 2019-12-05 DIAGNOSIS — Z1283 Encounter for screening for malignant neoplasm of skin: Secondary | ICD-10-CM | POA: Diagnosis not present

## 2019-12-06 DIAGNOSIS — I159 Secondary hypertension, unspecified: Secondary | ICD-10-CM | POA: Diagnosis not present

## 2019-12-07 LAB — BASIC METABOLIC PANEL
BUN/Creatinine Ratio: 13 (ref 10–24)
BUN: 20 mg/dL (ref 8–27)
CO2: 24 mmol/L (ref 20–29)
Calcium: 9.2 mg/dL (ref 8.6–10.2)
Chloride: 101 mmol/L (ref 96–106)
Creatinine, Ser: 1.52 mg/dL — ABNORMAL HIGH (ref 0.76–1.27)
GFR calc Af Amer: 54 mL/min/{1.73_m2} — ABNORMAL LOW (ref >59)
GFR calc non Af Amer: 47 mL/min/{1.73_m2} — ABNORMAL LOW (ref >59)
Glucose: 103 mg/dL — ABNORMAL HIGH (ref 65–99)
Potassium: 4.4 mmol/L (ref 3.5–5.2)
Sodium: 138 mmol/L (ref 134–144)

## 2019-12-19 ENCOUNTER — Other Ambulatory Visit: Payer: Self-pay

## 2019-12-19 ENCOUNTER — Ambulatory Visit (INDEPENDENT_AMBULATORY_CARE_PROVIDER_SITE_OTHER): Payer: PPO | Admitting: Pharmacist

## 2019-12-19 VITALS — BP 140/70 | HR 82 | Resp 14 | Ht 71.0 in | Wt 212.8 lb

## 2019-12-19 DIAGNOSIS — I159 Secondary hypertension, unspecified: Secondary | ICD-10-CM

## 2019-12-19 NOTE — Patient Instructions (Addendum)
Return for a  follow up appointment in 2 months  Check your blood pressure at home daily (if able) and keep record of the readings.  Take your BP meds as follows: *NO  MEDICATION CHANGES*  Bring all of your meds, your BP cuff and your record of home blood pressures to your next appointment.  Exercise as you're able, try to walk approximately 30 minutes per day.  Keep salt intake to a minimum, especially watch canned and prepared boxed foods.  Eat more fresh fruits and vegetables and fewer canned items.  Avoid eating in fast food restaurants.    HOW TO TAKE YOUR BLOOD PRESSURE: . Rest 5 minutes before taking your blood pressure. .  Don't smoke or drink caffeinated beverages for at least 30 minutes before. . Take your blood pressure before (not after) you eat. . Sit comfortably with your back supported and both feet on the floor (don't cross your legs). . Elevate your arm to heart level on a table or a desk. . Use the proper sized cuff. It should fit smoothly and snugly around your bare upper arm. There should be enough room to slip a fingertip under the cuff. The bottom edge of the cuff should be 1 inch above the crease of the elbow. . Ideally, take 3 measurements at one sitting and record the average.

## 2019-12-19 NOTE — Progress Notes (Signed)
Patient ID: NOLON YELLIN                 DOB: 03/24/1950                      MRN: [id number]     HPI: [name] [name] is a 69 y.o. male referred by Dr. Martinique to HTN clinic. PMH includes hyperlipidemia, and hypothyroidism. He is also undergoing therapy for lymphoma with Imbruvica. [name] is know to cause HTN in up to 20% of cases. Hydralazine was added to to his medication list during last OV. He experienced worsening LEE with amlodipine 7.5mg , somehow improved since decreasing dose back to 5mg  daily. His chemotherapy is know to cause LEE as well, but patient tolerating so far. Valsartan 160mg  was added to his regimen during last OV with clinical pharmacist and repeat BMET shows stable renal function.  Patient presents today to clinic for follow up and denies increased fatigue, dizziness, SOB, headaches, chest pain, or blurry vision. Stable on current chemotherapy regimen and plan to continue at least thru the end of the year.    Current HTN meds:  Amlodipine 5mg  daily PM Hydralazine 50 mg TID Valsartan 160mg  daily  Previously tried: none  BP goal: 130/80  Family History: The patient's family history includes Alzheimer's disease in his mother; COPD in his brother; Coronary artery disease in his brother; Dementia in his brother; Diabetes in his brother; Heart attack (age of onset: 38) in his father; Heart disease in his paternal grandfather; Hypertension in his brother; Stroke in his brother.   Social History: The patient  reports that he has never smoked. He has never used smokeless tobacco. He reports current alcohol use. He reports that he does not use drugs  Diet: avoid added salt but working on decreasing sodium in diet and eating health food  Exercise: activities of daily living  Home BP readings:  12 morning readings: average 142/71, HR range 67-80bpm 7 evening readings: average 145/68, HR range 69-76bpm  *Home BP cuff is accurate for his systolic within 32TFTD from manual  reading using correct technique*  Wt Readings from Last 3 Encounters:  12/19/19 212 lb 12.8 oz (96.5 kg)  11/21/19 214 lb (97.1 kg)  10/29/19 215 lb 12.8 oz (97.9 kg)   BP Readings from Last 3 Encounters:  12/19/19 140/70  11/21/19 (!) 152/78  10/29/19 140/75   Pulse Readings from Last 3 Encounters:  12/19/19 82  11/21/19 82  10/29/19 73     Past Medical History:  Diagnosis Date  . Arthritis   . Hemorrhoids 2014  . Hyperlipidemia   . Hypertension 08/09/2019  . 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 Thyroid disease 2007   hypothyroidism    Current Outpatient Medications on File Prior to Visit  Medication Sig Dispense Refill  . amLODipine (NORVASC) 5 MG tablet Take 1 tablet (5 mg total) by mouth daily. 30 tablet 3  . cyclobenzaprine (FLEXERIL) 10 MG tablet Take 1 tablet (10 mg total) by mouth 3 (three) times daily as needed for muscle spasms. 30 tablet 2  . hydrALAZINE (APRESOLINE) 50 MG tablet Take 1 tablet (50 mg total) by mouth 3 (three) times daily. 270 tablet 1  . Ibrutinib (IMBRUVICA) 560 MG TABS Take 560 mg by mouth daily. 30 tablet 11  . levothyroxine (SYNTHROID) 150 MCG tablet TAKE 1 TABLET BY MOUTH EVERY DAY 90 tablet 1  . Multiple Vitamins-Minerals (CENTRUM SILVER 50+MEN PO) Take by  mouth.    . prochlorperazine (COMPAZINE) 10 MG tablet Take 1 tablet (10 mg total) by mouth every 6 (six) hours as needed for nausea or vomiting. 30 tablet 1  . rosuvastatin (CRESTOR) 20 MG tablet TAKE 1 TABLET BY MOUTH EVERY DAY 90 tablet 1  . valsartan (DIOVAN) 160 MG tablet Take 1 tablet (160 mg total) by mouth daily. 30 tablet 5   No current facility-administered medications on file prior to visit.    No Known Allergies  Blood pressure 140/70, pulse 82, resp. rate 14, height 5\' 11"  (1.803 m), weight 212 lb 12.8 oz (96.5 kg), SpO2 97 %.  Hypertension Blood pressure remains slightly above goal, but improved since initiating valsartan. LEE stable  and serum creatinine slightly increased but stable as well. Noted his blood pressure remains between 130s and 140 most of the time with an elevation to 150s for a 48 hours time period. Patient is unable to recall any changes during that time, but is possible that the elavated BP was after sodium indiscretion.   Will continue all medication without changes, continue to monitor BP twice daily, and follow up in 8 weeks. Patient will keep more detail records to see is we can identify trigger for pick BP readings.  Emmy Keng Rodriguez-Guzman PharmD, BCPS, [place] [address] [place] [address] 12/25/2019 1:45 PM

## 2019-12-25 ENCOUNTER — Encounter: Payer: Self-pay | Admitting: Pharmacist

## 2019-12-25 NOTE — Assessment & Plan Note (Addendum)
Blood pressure remains slightly above goal, but improved since initiating valsartan. LEE stable and serum creatinine slightly increased but stable as well. Noted his blood pressure remains between 130s and 140 most of the time with an elevation to 150s for a 48 hours time period. Patient is unable to recall any changes during that time, but is possible that the elavated BP was after sodium indiscretion.   Will continue all medication without changes, continue to monitor BP twice daily, and follow up in 8 weeks. Patient will keep more detail records to see is we can identify trigger for pick BP readings.

## 2019-12-28 ENCOUNTER — Encounter: Payer: Self-pay | Admitting: Internal Medicine

## 2020-01-02 ENCOUNTER — Encounter: Payer: Self-pay | Admitting: Hematology & Oncology

## 2020-01-02 ENCOUNTER — Inpatient Hospital Stay: Payer: PPO | Attending: Hematology & Oncology | Admitting: Hematology & Oncology

## 2020-01-02 ENCOUNTER — Other Ambulatory Visit: Payer: Self-pay

## 2020-01-02 ENCOUNTER — Inpatient Hospital Stay: Payer: PPO

## 2020-01-02 ENCOUNTER — Telehealth: Payer: Self-pay | Admitting: Hematology & Oncology

## 2020-01-02 VITALS — BP 135/62 | HR 76 | Temp 98.3°F | Resp 18 | Ht 71.0 in | Wt 212.4 lb

## 2020-01-02 DIAGNOSIS — C8583 Other specified types of non-Hodgkin lymphoma, intra-abdominal lymph nodes: Secondary | ICD-10-CM

## 2020-01-02 LAB — CBC WITH DIFFERENTIAL (CANCER CENTER ONLY)
Abs Immature Granulocytes: 0 10*3/uL (ref 0.00–0.07)
Basophils Absolute: 0 10*3/uL (ref 0.0–0.1)
Basophils Relative: 0 %
Eosinophils Absolute: 0.1 10*3/uL (ref 0.0–0.5)
Eosinophils Relative: 1 %
HCT: 38.6 % — ABNORMAL LOW (ref 39.0–52.0)
Hemoglobin: 12.4 g/dL — ABNORMAL LOW (ref 13.0–17.0)
Lymphocytes Relative: 80 %
Lymphs Abs: 9.4 10*3/uL — ABNORMAL HIGH (ref 0.7–4.0)
MCH: 29.2 pg (ref 26.0–34.0)
MCHC: 32.1 g/dL (ref 30.0–36.0)
MCV: 91 fL (ref 80.0–100.0)
Monocytes Absolute: 0.7 10*3/uL (ref 0.1–1.0)
Monocytes Relative: 6 %
Neutro Abs: 1.5 10*3/uL — ABNORMAL LOW (ref 1.7–7.7)
Neutrophils Relative %: 13 %
Platelet Count: 154 10*3/uL (ref 150–400)
RBC: 4.24 MIL/uL (ref 4.22–5.81)
RDW: 13.3 % (ref 11.5–15.5)
WBC Count: 11.8 10*3/uL — ABNORMAL HIGH (ref 4.0–10.5)
nRBC: 0 % (ref 0.0–0.2)

## 2020-01-02 LAB — CMP (CANCER CENTER ONLY)
ALT: 15 U/L (ref 0–44)
AST: 23 U/L (ref 15–41)
Albumin: 4.1 g/dL (ref 3.5–5.0)
Alkaline Phosphatase: 43 U/L (ref 38–126)
Anion gap: 8 (ref 5–15)
BUN: 22 mg/dL (ref 8–23)
CO2: 28 mmol/L (ref 22–32)
Calcium: 10.1 mg/dL (ref 8.9–10.3)
Chloride: 102 mmol/L (ref 98–111)
Creatinine: 1.63 mg/dL — ABNORMAL HIGH (ref 0.61–1.24)
GFR, Estimated: 43 mL/min — ABNORMAL LOW (ref >60)
Glucose, Bld: 86 mg/dL (ref 70–99)
Potassium: 4.7 mmol/L (ref 3.5–5.1)
Sodium: 138 mmol/L (ref 135–145)
Total Bilirubin: 0.6 mg/dL (ref 0.3–1.2)
Total Protein: 7 g/dL (ref 6.5–8.1)

## 2020-01-02 LAB — LACTATE DEHYDROGENASE: LDH: 206 U/L — ABNORMAL HIGH (ref 98–192)

## 2020-01-02 MED ORDER — IMBRUVICA 560 MG PO TABS: 420.0000 mg | ORAL_TABLET | Freq: Every day | ORAL | 11 refills | Status: DC

## 2020-01-02 NOTE — Progress Notes (Signed)
Hematology and Oncology Follow Up Visit  [name] [name] [id number] 06-06-1950 69 y.o. 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  Overall, he is doing pretty well.  He is tolerating the Imbruvica nicely.  He still having some occasional muscle spasms.  We did put him on some Flexeril which helps but does cause some drowsiness.  His blood pressure is doing better.  He is on I think 3 different blood pressure medications.  He has little bit of swelling in the legs.  This might be from the Norvasc that he takes.  He is not noted any swollen lymph nodes.  He has had no rashes.  There is been no issues with bowels or bladder.  He had no cough or shortness of breath.  Overall, his performance status is ECOG 0.    Medications:  Current Outpatient Medications:  .  amLODipine (NORVASC) 5 MG tablet, Take 1 tablet (5 mg total) by mouth daily., Disp: 30 tablet, Rfl: 3 .  cyclobenzaprine (FLEXERIL) 10 MG tablet, Take 1 tablet (10 mg total) by mouth 3 (three) times daily as needed for muscle spasms., Disp: 30 tablet, Rfl: 2 .  hydrALAZINE (APRESOLINE) 50 MG tablet, Take 1 tablet (50 mg total) by mouth 3 (three) times daily., Disp: 270 tablet, Rfl: 1 .  Ibrutinib (IMBRUVICA) 560 MG TABS, Take 560 mg by mouth daily., Disp: 30 tablet, Rfl: 11 .  levothyroxine (SYNTHROID) 150 MCG tablet, TAKE 1 TABLET BY MOUTH EVERY DAY, Disp: 90 tablet, Rfl: 1 .  Multiple Vitamins-Minerals (CENTRUM SILVER 50+MEN PO), Take by mouth., Disp: , Rfl:  .  prochlorperazine (COMPAZINE) 10 MG tablet, Take 1 tablet (10 mg total) by mouth every 6 (six) hours as needed for nausea or vomiting., Disp: 30 tablet, Rfl: 1 .  rosuvastatin (CRESTOR) 20 MG tablet, TAKE 1 TABLET BY MOUTH EVERY DAY, Disp: 90 tablet, Rfl: 1 .  valsartan (DIOVAN)  160 MG tablet, Take 1 tablet (160 mg total) by mouth daily., Disp: 30 tablet, Rfl: 5  Allergies: No Known Allergies  Past Medical History, Surgical history, Social history, and Family History were reviewed and updated.  Review of Systems: Review of Systems  Constitutional: Negative.   HENT:  Negative.   Eyes: Negative.   Respiratory: Negative.   Cardiovascular: Negative.   Gastrointestinal: Positive for abdominal pain and nausea.  Endocrine: Negative.   Genitourinary: Negative.    Musculoskeletal: Negative.   Skin: Negative.   Neurological: Negative.   Hematological: Negative.   Psychiatric/Behavioral: Negative.     Physical Exam:  height is 5\' 11"  (1.803 m) and weight is 212 lb 6.4 oz (96.3 kg). His oral temperature is 98.3 F (36.8 C). His blood pressure is 135/62 and his pulse is 76. His respiration is 18 and oxygen saturation is 99%.   Wt Readings from Last 3 Encounters:  01/02/20 212 lb 6.4 oz (96.3 kg)  12/19/19 212 lb 12.8 oz (96.5 kg)  11/21/19 214 lb (97.1 kg)    Physical Exam Vitals reviewed.  HENT:     Head: Normocephalic and atraumatic.  Eyes:     Pupils: Pupils are equal, round, and reactive to light.  Cardiovascular:     Rate and Rhythm: Normal rate and regular rhythm.     Heart sounds: Normal heart  sounds.  Pulmonary:     Effort: Pulmonary effort is normal.     Breath sounds: Normal breath sounds.  Abdominal:     General: Bowel sounds are normal.     Palpations: Abdomen is soft.  Musculoskeletal:        General: No tenderness or deformity. Normal range of motion.     Cervical back: Normal range of motion.  Lymphadenopathy:     Cervical: No cervical adenopathy.  Skin:    General: Skin is warm and dry.     Findings: No erythema or rash.  Neurological:     Mental Status: He is alert and oriented to person, place, and time.  Psychiatric:        Behavior: Behavior normal.        Thought Content: Thought content normal.        Judgment: Judgment  normal.      Lab Results  Component Value Date   WBC 11.8 (H) 01/02/2020   HGB 12.4 (L) 01/02/2020   HCT 38.6 (L) 01/02/2020   MCV 91.0 01/02/2020   PLT 154 01/02/2020     Chemistry      Component Value Date/Time   NA 138 01/02/2020 0908   NA 138 12/06/2019 1501   NA 142 04/25/2016 0844   NA 140 10/19/2015 0903   K 4.7 01/02/2020 0908   K 4.1 04/25/2016 0844   K 4.1 10/19/2015 0903   CL 102 01/02/2020 0908   CL 102 04/25/2016 0844   CO2 28 01/02/2020 0908   CO2 29 04/25/2016 0844   CO2 25 10/19/2015 0903   BUN 22 01/02/2020 0908   BUN 20 12/06/2019 1501   BUN 14 04/25/2016 0844   BUN 21.6 10/19/2015 0903   CREATININE 1.63 (H) 01/02/2020 0908   CREATININE 1.1 04/25/2016 0844   CREATININE 1.3 10/19/2015 0903      Component Value Date/Time   CALCIUM 10.1 01/02/2020 0908   CALCIUM 9.6 04/25/2016 0844   CALCIUM 9.4 10/19/2015 0903   ALKPHOS 43 01/02/2020 0908   ALKPHOS 59 04/25/2016 0844   ALKPHOS 68 10/19/2015 0903   AST 23 01/02/2020 0908   AST 21 10/19/2015 0903   ALT 15 01/02/2020 0908   ALT 27 04/25/2016 0844   ALT 19 10/19/2015 0903   BILITOT 0.6 01/02/2020 0908   BILITOT 0.54 10/19/2015 0903      Impression and Plan: [name] [name] is a 69 year old white male.  We treated him 8 years ago.  He had a marginal zone lymphoma.  He went into clinical remission.  When he initially presented, he had splenomegaly.  This resolved.  I think that we can now decrease the Imbruvica dose down to 420 mg a day.  His white cell counts come down quite nicely.  We will probably plan for a follow-up scan in December.  I would like to think that everything will still be holding steady.        [name], MD 10/14/202110:35 AM

## 2020-01-02 NOTE — Telephone Encounter (Signed)
Appointments scheduled after patient left the office and he did not get contrast before he left the office.  Secure chat sent to Imaging Department to schedule dfor same date as MD viist in December/ per 10/14 los

## 2020-01-03 ENCOUNTER — Other Ambulatory Visit: Payer: Self-pay | Admitting: *Deleted

## 2020-01-03 DIAGNOSIS — C8583 Other specified types of non-Hodgkin lymphoma, intra-abdominal lymph nodes: Secondary | ICD-10-CM

## 2020-01-03 LAB — IGG, IGA, IGM
IgA: 184 mg/dL (ref 61–437)
IgG (Immunoglobin G), Serum: 654 mg/dL (ref 603–1613)
IgM (Immunoglobulin M), Srm: 1132 mg/dL — ABNORMAL HIGH (ref 20–172)

## 2020-01-03 MED ORDER — IMBRUVICA 420 MG PO TABS: 420.0000 mg | ORAL_TABLET | Freq: Every day | ORAL | 11 refills | Status: DC

## 2020-01-10 ENCOUNTER — Other Ambulatory Visit: Payer: Self-pay

## 2020-01-10 ENCOUNTER — Encounter: Payer: Self-pay | Admitting: Hematology & Oncology

## 2020-01-10 DIAGNOSIS — C8583 Other specified types of non-Hodgkin lymphoma, intra-abdominal lymph nodes: Secondary | ICD-10-CM

## 2020-01-10 MED ORDER — IMBRUVICA 420 MG PO TABS: 420.0000 mg | ORAL_TABLET | Freq: Every day | ORAL | 11 refills | Status: DC

## 2020-01-27 ENCOUNTER — Telehealth: Payer: Self-pay | Admitting: Pharmacy Technician

## 2020-01-27 NOTE — Telephone Encounter (Signed)
Oral Oncology Patient Advocate Encounter  Received fax notification on 01/05/2020 from [employer] ([employer]) that Mr [name] [name]'s assistance for [name] will be extended until 03/20/2021.    [name] Phone [phone] Fax [phone] 01/27/2020 10:51 AM

## 2020-01-30 DIAGNOSIS — Z1283 Encounter for screening for malignant neoplasm of skin: Secondary | ICD-10-CM | POA: Diagnosis not present

## 2020-01-30 DIAGNOSIS — Z86006 Personal history of melanoma in-situ: Secondary | ICD-10-CM | POA: Diagnosis not present

## 2020-01-30 DIAGNOSIS — Z08 Encounter for follow-up examination after completed treatment for malignant neoplasm: Secondary | ICD-10-CM | POA: Diagnosis not present

## 2020-01-30 DIAGNOSIS — L821 Other seborrheic keratosis: Secondary | ICD-10-CM | POA: Diagnosis not present

## 2020-01-30 DIAGNOSIS — L570 Actinic keratosis: Secondary | ICD-10-CM | POA: Diagnosis not present

## 2020-01-30 DIAGNOSIS — X32XXXD Exposure to sunlight, subsequent encounter: Secondary | ICD-10-CM | POA: Diagnosis not present

## 2020-02-11 ENCOUNTER — Ambulatory Visit (INDEPENDENT_AMBULATORY_CARE_PROVIDER_SITE_OTHER): Payer: PPO | Admitting: Pharmacist Clinician (PhC)/ Clinical Pharmacy Specialist

## 2020-02-11 ENCOUNTER — Other Ambulatory Visit: Payer: Self-pay

## 2020-02-11 DIAGNOSIS — I159 Secondary hypertension, unspecified: Secondary | ICD-10-CM

## 2020-02-11 MED ORDER — HYDRALAZINE HCL 100 MG PO TABS: 100.0000 mg | ORAL_TABLET | Freq: Two times a day (BID) | ORAL | 3 refills | Status: DC

## 2020-02-11 MED ORDER — VALSARTAN 320 MG PO TABS: 320.0000 mg | ORAL_TABLET | Freq: Every day | ORAL | 6 refills | Status: DC

## 2020-02-11 NOTE — Patient Instructions (Addendum)
Return for a a follow up appointment January 4 at 9:30 am  Check your blood pressure at home daily (if able) and keep record of the readings.  If you can, look for [employer] brand   If you have any problems, please call Vishwa Dais/Raquel at [phone]  Take your BP meds as follows:  Stop amlodipine.  Increase hydralazine to 100 mg three times daily.  Take 2 of the 50 mg tablets for each dose until gone, then start with the 100 mg tablets  Increase valsartan to 320 mg once daily.  Take 2 of the 160 mg tablets for each dose until gone, then start with the 320 mg tablets.   Bring all of your meds, your BP cuff and your record of home blood pressures to your next appointment.  Exercise as you're able, try to walk approximately 30 minutes per day.  Keep salt intake to a minimum, especially watch canned and prepared boxed foods.  Eat more fresh fruits and vegetables and fewer canned items.  Avoid eating in fast food restaurants.    HOW TO TAKE YOUR BLOOD PRESSURE: . Rest 5 minutes before taking your blood pressure. .  Don't smoke or drink caffeinated beverages for at least 30 minutes before. . Take your blood pressure before (not after) you eat. . Sit comfortably with your back supported and both feet on the floor (don't cross your legs). . Elevate your arm to heart level on a table or a desk. . Use the proper sized cuff. It should fit smoothly and snugly around your bare upper arm. There should be enough room to slip a fingertip under the cuff. The bottom edge of the cuff should be 1 inch above the crease of the elbow. . Ideally, take 3 measurements at one sitting and record the average.

## 2020-02-11 NOTE — Progress Notes (Signed)
Patient ID: [name]                 DOB: 09-29-1950                      MRN: [id number]     HPI: [name] [name] is a 69 y.o. male referred by Dr. Martinique to HTN clinic. PMH includes hyperlipidemia, and hypothyroidism. He is also undergoing therapy for lymphoma with Imbruvica. [name] [name] is know to cause HTN in up to 20% of cases. Hydralazine was added to to his medication list during last OV. He experienced worsening LEE with amlodipine 7.5mg , somehow improved since decreasing dose back to 5mg  daily. His chemotherapy is know to cause LEE as well, but patient tolerating so far. Valsartan 160mg  was added to his regimen during earlier OV with clinical pharmacist and repeat BMET shows stable renal function.  At his last visit his pressure was 14/70, but was seeing some lower numbers at home and no changes were made.   Patient presents today to clinic for follow up.  He feels well and only notes ongoing edema.  Today his ankles are rather swollen, but he states is usually worse towards the end of day.  Both Imbruvica and amlodipine can lead to edema.  His Imbruvica dose was decreased about 3 weeks ago, from 560 mg to 420 mg daily.  There was not any notable drop in his blood pressure (or changes in edema) from the dose change.  He is currently scheduled to have a CT scan and labs on December 9 at the [facility].    Current HTN meds:  Amlodipine 5mg  daily PM Hydralazine 50 mg TID Valsartan 160mg  daily  Previously tried: none  BP goal: 130/80  Family History: The patient's family history includes Alzheimer's disease in his mother; COPD in his brother; Coronary artery disease in his brother; Dementia in his brother; Diabetes in his brother; Heart attack (age of onset: 59) in his father; Heart disease in his paternal grandfather; Hypertension in his brother; Stroke in his brother.   Social History: The patient  reports that he has never smoked. He has never used smokeless tobacco. He reports  current alcohol use. He reports that he does not use drugs  Diet: avoid added salt but working on decreasing sodium in diet and eating health food  Exercise: activities of daily living; was able to [place] without stopping; feels as if more energy  Home BP readings:  14 morning readings: average 144/70  (previously 142/71) 9 evening readings: average 146/72  (previously 145/68)  *Home BP cuff is accurate for his systolic within 69SWNI from manual reading using correct technique*  Wt Readings from Last 3 Encounters:  02/11/20 215 lb 12.8 oz (97.9 kg)  01/02/20 212 lb 6.4 oz (96.3 kg)  12/19/19 212 lb 12.8 oz (96.5 kg)   BP Readings from Last 3 Encounters:  02/11/20 140/72  01/02/20 135/62  12/19/19 140/70   Pulse Readings from Last 3 Encounters:  02/11/20 82  01/02/20 76  12/19/19 82     Past Medical History:  Diagnosis Date  . Arthritis   . Hemorrhoids 2014  . Hyperlipidemia   . Hypertension 08/09/2019  . 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 Thyroid disease 2007   hypothyroidism    Current Outpatient Medications on File Prior to Visit  Medication Sig Dispense Refill  . cyclobenzaprine (FLEXERIL) 10 MG tablet Take 1 tablet (10 mg total)  by mouth 3 (three) times daily as needed for muscle spasms. 30 tablet 2  . Ibrutinib (IMBRUVICA) 420 MG TABS Take 420 mg by mouth daily. 28 tablet 11  . levothyroxine (SYNTHROID) 150 MCG tablet TAKE 1 TABLET BY MOUTH EVERY DAY 90 tablet 1  . Multiple Vitamins-Minerals (CENTRUM SILVER 50+MEN PO) Take by mouth.    . prochlorperazine (COMPAZINE) 10 MG tablet Take 1 tablet (10 mg total) by mouth every 6 (six) hours as needed for nausea or vomiting. 30 tablet 1  . rosuvastatin (CRESTOR) 20 MG tablet TAKE 1 TABLET BY MOUTH EVERY DAY 90 tablet 1   No current facility-administered medications on file prior to visit.    No Known Allergies  Blood pressure 140/72, pulse 82, resp. rate 15, height 5\' 11"   (1.803 m), weight 215 lb 12.8 oz (97.9 kg), SpO2 98 %.  Hypertension Patient with secondary hypertension, due to current chemotherapy regimen (Imbruvica) for lymphoma.  Home readings still remain above goal.  Because of his ongoing edema, will stop the amlodipine to see if we can reduce this.  To compensate, will increase the valsartan to 320 mg daily and increase the hydralazine to 100 mg tid.  He is scheduled to have repeat metabolic panel drawn on December 9, so if at that time there is any significant change to kidney function, we may have to make further changes.  He is scheduled to return to [facility] in 6-8 weeks for follow up.    [name] [name] PharmD CPP [facility] [address] [place] [address] 02/11/2020 1:48 PM

## 2020-02-11 NOTE — Assessment & Plan Note (Signed)
Patient with secondary hypertension, due to current chemotherapy regimen (Imbruvica) for lymphoma.  Home readings still remain above goal.  Because of his ongoing edema, will stop the amlodipine to see if we can reduce this.  To compensate, will increase the valsartan to 320 mg daily and increase the hydralazine to 100 mg tid.  He is scheduled to have repeat metabolic panel drawn on December 9, so if at that time there is any significant change to kidney function, we may have to make further changes.  He is scheduled to return to [facility] in 6-8 weeks for follow up.

## 2020-02-27 ENCOUNTER — Encounter: Payer: Self-pay | Admitting: Hematology & Oncology

## 2020-02-27 ENCOUNTER — Telehealth: Payer: Self-pay

## 2020-02-27 ENCOUNTER — Encounter (HOSPITAL_BASED_OUTPATIENT_CLINIC_OR_DEPARTMENT_OTHER): Payer: Self-pay

## 2020-02-27 ENCOUNTER — Ambulatory Visit (HOSPITAL_BASED_OUTPATIENT_CLINIC_OR_DEPARTMENT_OTHER)
Admission: RE | Admit: 2020-02-27 | Discharge: 2020-02-27 | Disposition: A | Discharge status: Home / Self Care | Payer: PPO | Source: Ambulatory Visit | Attending: Hematology & Oncology | Admitting: Hematology & Oncology

## 2020-02-27 ENCOUNTER — Inpatient Hospital Stay (HOSPITAL_BASED_OUTPATIENT_CLINIC_OR_DEPARTMENT_OTHER): Payer: PPO | Admitting: Hematology & Oncology

## 2020-02-27 ENCOUNTER — Other Ambulatory Visit: Payer: Self-pay

## 2020-02-27 ENCOUNTER — Inpatient Hospital Stay: Payer: PPO | Attending: Hematology & Oncology

## 2020-02-27 ENCOUNTER — Other Ambulatory Visit: Payer: Self-pay | Admitting: Pharmacist Clinician (PhC)/ Clinical Pharmacy Specialist

## 2020-02-27 VITALS — BP 169/73 | HR 9 | Temp 97.5°F | Resp 18 | Wt 217.0 lb

## 2020-02-27 DIAGNOSIS — M7989 Other specified soft tissue disorders: Secondary | ICD-10-CM | POA: Diagnosis not present

## 2020-02-27 DIAGNOSIS — C8583 Other specified types of non-Hodgkin lymphoma, intra-abdominal lymph nodes: Secondary | ICD-10-CM

## 2020-02-27 DIAGNOSIS — K449 Diaphragmatic hernia without obstruction or gangrene: Secondary | ICD-10-CM | POA: Diagnosis not present

## 2020-02-27 DIAGNOSIS — J439 Emphysema, unspecified: Secondary | ICD-10-CM | POA: Diagnosis not present

## 2020-02-27 DIAGNOSIS — I2510 Atherosclerotic heart disease of native coronary artery without angina pectoris: Secondary | ICD-10-CM | POA: Diagnosis not present

## 2020-02-27 DIAGNOSIS — C884 Extranodal marginal zone B-cell lymphoma of mucosa-associated lymphoid tissue [MALT-lymphoma]: Secondary | ICD-10-CM | POA: Diagnosis not present

## 2020-02-27 DIAGNOSIS — I708 Atherosclerosis of other arteries: Secondary | ICD-10-CM | POA: Diagnosis not present

## 2020-02-27 DIAGNOSIS — K5730 Diverticulosis of large intestine without perforation or abscess without bleeding: Secondary | ICD-10-CM | POA: Diagnosis not present

## 2020-02-27 LAB — CBC WITH DIFFERENTIAL (CANCER CENTER ONLY)
Abs Immature Granulocytes: 0.05 10*3/uL (ref 0.00–0.07)
Basophils Absolute: 0.1 10*3/uL (ref 0.0–0.1)
Basophils Relative: 1 %
Eosinophils Absolute: 0.1 10*3/uL (ref 0.0–0.5)
Eosinophils Relative: 0 %
HCT: 38.9 % — ABNORMAL LOW (ref 39.0–52.0)
Hemoglobin: 12.4 g/dL — ABNORMAL LOW (ref 13.0–17.0)
Immature Granulocytes: 0 %
Lymphocytes Relative: 66 %
Lymphs Abs: 8.8 10*3/uL — ABNORMAL HIGH (ref 0.7–4.0)
MCH: 29.6 pg (ref 26.0–34.0)
MCHC: 31.9 g/dL (ref 30.0–36.0)
MCV: 92.8 fL (ref 80.0–100.0)
Monocytes Absolute: 0.7 10*3/uL (ref 0.1–1.0)
Monocytes Relative: 5 %
Neutro Abs: 3.8 10*3/uL (ref 1.7–7.7)
Neutrophils Relative %: 28 %
Platelet Count: 115 10*3/uL — ABNORMAL LOW (ref 150–400)
RBC: 4.19 MIL/uL — ABNORMAL LOW (ref 4.22–5.81)
RDW: 13.8 % (ref 11.5–15.5)
WBC Count: 13.4 10*3/uL — ABNORMAL HIGH (ref 4.0–10.5)
nRBC: 0 % (ref 0.0–0.2)

## 2020-02-27 LAB — CMP (CANCER CENTER ONLY)
ALT: 16 U/L (ref 0–44)
AST: 24 U/L (ref 15–41)
Albumin: 4.3 g/dL (ref 3.5–5.0)
Alkaline Phosphatase: 39 U/L (ref 38–126)
Anion gap: 6 (ref 5–15)
BUN: 24 mg/dL — ABNORMAL HIGH (ref 8–23)
CO2: 29 mmol/L (ref 22–32)
Calcium: 10.2 mg/dL (ref 8.9–10.3)
Chloride: 102 mmol/L (ref 98–111)
Creatinine: 1.52 mg/dL — ABNORMAL HIGH (ref 0.61–1.24)
GFR, Estimated: 50 mL/min — ABNORMAL LOW (ref >60)
Glucose, Bld: 82 mg/dL (ref 70–99)
Potassium: 4.6 mmol/L (ref 3.5–5.1)
Sodium: 137 mmol/L (ref 135–145)
Total Bilirubin: 0.6 mg/dL (ref 0.3–1.2)
Total Protein: 7.4 g/dL (ref 6.5–8.1)

## 2020-02-27 LAB — LACTATE DEHYDROGENASE: LDH: 192 U/L (ref 98–192)

## 2020-02-27 LAB — SAVE SMEAR(SSMR), FOR PROVIDER SLIDE REVIEW

## 2020-02-27 MED ORDER — IOHEXOL 300 MG/ML  SOLN
100.0000 mL | Freq: Once | INTRAMUSCULAR | Status: AC | PRN
  Administered 2020-02-27: 100 mL via INTRAVENOUS

## 2020-02-27 MED ORDER — HYDRALAZINE HCL 100 MG PO TABS: 100.0000 mg | ORAL_TABLET | Freq: Three times a day (TID) | ORAL | 6 refills | Status: DC

## 2020-02-27 MED ORDER — TRIAMTERENE-HCTZ 75-50 MG PO TABS: 1.0000 | ORAL_TABLET | Freq: Every day | ORAL | 5 refills | Status: DC

## 2020-02-27 NOTE — Progress Notes (Signed)
Hematology and Oncology Follow Up Visit  [name] [name] [id number] Nov 10, 1950 69 y.o. 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  We did go ahead and do a CT scan on him today.  Thankfully, the CT scan did not show any evidence of recurrent or progressive marginal zone lymphoma.  As such, the [name] [name] is doing quite nicely.  He has swelling in his legs.  He is now off Norvasc.  He still has swelling.  I think that a diuretic would be reasonable.  We will try him on Maxide.  He has had no fever.  He has had no nausea or vomiting.  He has had no change in bowel or bladder habits.  I think he does have some hemorrhoids.  He does have muscle cramps.  He is on Flexeril which seems to help but he does have some drowsiness afterwards.  He had a nice Thanksgiving.  He was with family.  He will have a nice Christmas also with family.  He is having some blood pressure issues.  His family doctor is trying to monitor this and manage this.  There is no rashes.  Overall, I would say his performance status is [name] [name].    Medications:  Current Outpatient Medications:  .  cyclobenzaprine (FLEXERIL) 10 MG tablet, Take [name] tablet (10 mg total) by mouth 3 (three) times daily as needed for muscle spasms., Disp: 30 tablet, Rfl: 2 .  hydrALAZINE (APRESOLINE) 100 MG tablet, Take [name] tablet (100 mg total) by mouth 3 (three) times daily., Disp: 90 tablet, Rfl: 6 .  Ibrutinib (IMBRUVICA) 420 MG TABS, Take 420 mg by mouth daily., Disp: 28 tablet, Rfl: 11 .  levothyroxine (SYNTHROID) 150 MCG tablet, TAKE [name] TABLET BY MOUTH EVERY DAY, Disp: 90 tablet, Rfl: [name] .  Multiple Vitamins-Minerals (CENTRUM SILVER 50+MEN PO), Take by mouth., Disp: , Rfl:  .  prochlorperazine (COMPAZINE) 10 MG tablet, Take [name] tablet (10 mg total) by mouth  every 6 (six) hours as needed for nausea or vomiting., Disp: 30 tablet, Rfl: [name] .  rosuvastatin (CRESTOR) 20 MG tablet, TAKE [name] TABLET BY MOUTH EVERY DAY, Disp: 90 tablet, Rfl: [name] .  valsartan (DIOVAN) 320 MG tablet, Take [name] tablet (320 mg total) by mouth daily., Disp: 30 tablet, Rfl: 6  Allergies: No Known Allergies  Past Medical History, Surgical history, Social history, and Family History were reviewed and updated.  Review of Systems: Review of Systems  Constitutional: Negative.   HENT:  Negative.   Eyes: Negative.   Respiratory: Negative.   Cardiovascular: Negative.   Gastrointestinal: Positive for abdominal pain and nausea.  Endocrine: Negative.   Genitourinary: Negative.    Musculoskeletal: Negative.   Skin: Negative.   Neurological: Negative.   Hematological: Negative.   Psychiatric/Behavioral: Negative.     Physical Exam:  weight is 217 lb (98.4 kg). His oral temperature is 97.5 F (36.4 C) (abnormal). His blood pressure is 169/73 (abnormal) and his pulse is 9 (abnormal). His respiration is 18 and oxygen saturation is 100%.   Wt Readings from Last 3 Encounters:  02/27/20 217 lb (98.4 kg)  02/11/20 215 lb 12.8 oz (97.9 kg)  01/02/20 212 lb 6.4 oz (96.3 kg)    Physical Exam Vitals reviewed.  HENT:  Head: Normocephalic and atraumatic.  Eyes:     Pupils: Pupils are equal, round, and reactive to light.  Cardiovascular:     Rate and Rhythm: Normal rate and regular rhythm.     Heart sounds: Normal heart sounds.  Pulmonary:     Effort: Pulmonary effort is normal.     Breath sounds: Normal breath sounds.  Abdominal:     General: Bowel sounds are normal.     Palpations: Abdomen is soft.  Musculoskeletal:        General: No tenderness or deformity. Normal range of motion.     Cervical back: Normal range of motion.  Lymphadenopathy:     Cervical: No cervical adenopathy.  Skin:    General: Skin is warm and dry.     Findings: No erythema or rash.  Neurological:      Mental Status: He is alert and oriented to person, place, and time.  Psychiatric:        Behavior: Behavior normal.        Thought Content: Thought content normal.        Judgment: Judgment normal.      Lab Results  Component Value Date   WBC 13.4 (H) 02/27/2020   HGB 12.4 (L) 02/27/2020   HCT 38.9 (L) 02/27/2020   MCV 92.8 02/27/2020   PLT 115 (L) 02/27/2020     Chemistry      Component Value Date/Time   NA 137 02/27/2020 0813   NA 138 12/06/2019 1501   NA 142 04/25/2016 0844   NA 140 10/19/2015 0903   K 4.6 02/27/2020 0813   K 4.[name] 04/25/2016 0844   K 4.[name] 10/19/2015 0903   CL 102 02/27/2020 0813   CL 102 04/25/2016 0844   CO2 29 02/27/2020 0813   CO2 29 04/25/2016 0844   CO2 25 10/19/2015 0903   BUN 24 (H) 02/27/2020 0813   BUN 20 12/06/2019 1501   BUN 14 04/25/2016 0844   BUN 21.6 10/19/2015 0903   CREATININE [name].52 (H) 02/27/2020 0813   CREATININE [name].[name] 04/25/2016 0844   CREATININE [name].3 10/19/2015 0903      Component Value Date/Time   CALCIUM 10.2 02/27/2020 0813   CALCIUM 9.6 04/25/2016 0844   CALCIUM 9.4 10/19/2015 0903   ALKPHOS 39 02/27/2020 0813   ALKPHOS 59 04/25/2016 0844   ALKPHOS 68 10/19/2015 0903   AST 24 02/27/2020 0813   AST 21 10/19/2015 0903   ALT 16 02/27/2020 0813   ALT 27 04/25/2016 0844   ALT 19 10/19/2015 0903   BILITOT 0.6 02/27/2020 0813   BILITOT 0.54 10/19/2015 0903      Impression and Plan: [name] [name] is a 69 year old white male.  We treated him 8 years ago.  He had a marginal zone lymphoma.  He went into clinical remission.  When he initially presented, he had splenomegaly.  This resolved.  I think that we maintain the dose of Imbruvica.  I do think on occasion that we might want to add venetoclax.  This might be reasonable so that we do not have to do continual treatment on him.  I will have to think about this.  Hopefully, the diuretic will help with his leg swelling.  We will plan to get him back in 6 weeks.  I do not think we  have to do another scan probably until March or April.       [name], MD 12/9/202111:23 AM

## 2020-02-27 NOTE — Telephone Encounter (Signed)
appts made and printed per 02/27/20 los... AOM

## 2020-02-28 LAB — BETA 2 MICROGLOBULIN, SERUM: Beta-2 Microglobulin: 2.2 mg/L (ref 0.6–2.4)

## 2020-03-12 ENCOUNTER — Other Ambulatory Visit: Payer: Self-pay | Admitting: Cardiology

## 2020-03-19 ENCOUNTER — Encounter: Payer: Self-pay | Admitting: Hematology & Oncology

## 2020-03-24 ENCOUNTER — Ambulatory Visit: Payer: PPO

## 2020-03-29 ENCOUNTER — Encounter: Payer: Self-pay | Admitting: Internal Medicine

## 2020-03-29 ENCOUNTER — Encounter: Payer: Self-pay | Admitting: Hematology & Oncology

## 2020-03-31 ENCOUNTER — Other Ambulatory Visit: Payer: Self-pay | Admitting: Internal Medicine

## 2020-03-31 ENCOUNTER — Telehealth: Payer: Self-pay

## 2020-03-31 NOTE — Telephone Encounter (Signed)
Contacted pt. In regard to MAB infusion treatment for COVID 19. Pt. Reports he has symptoms that started 03/18/20. Cough, fatigue, weight loss. Pt. Is out of 7 day window for treatment. Instructed to follow up with his PCP and given Post COVID care clinic number.

## 2020-04-03 ENCOUNTER — Encounter: Payer: Self-pay | Admitting: Hematology & Oncology

## 2020-04-03 ENCOUNTER — Telehealth (INDEPENDENT_AMBULATORY_CARE_PROVIDER_SITE_OTHER): Payer: PPO | Admitting: Nurse Practitioner

## 2020-04-03 DIAGNOSIS — U071 COVID-19: Secondary | ICD-10-CM | POA: Diagnosis not present

## 2020-04-03 MED ORDER — AZITHROMYCIN 250 MG PO TABS: ORAL_TABLET | ORAL | 0 refills | Status: DC

## 2020-04-03 MED ORDER — PREDNISONE 20 MG PO TABS: 20.0000 mg | ORAL_TABLET | Freq: Every day | ORAL | 0 refills | Status: AC

## 2020-04-03 NOTE — Progress Notes (Signed)
Virtual Visit via Telephone Note  I connected with [name] [name] on 04/03/20 at  3:00 PM EST by telephone and verified that I am speaking with the correct person using two identifiers.  Location: Patient: home Provider: remote   I discussed the limitations, risks, security and privacy concerns of performing an evaluation and management service by telephone and the availability of in person appointments. I also discussed with the patient that there may be a patient responsible charge related to this service. The patient expressed understanding and agreed to proceed.   Chief Complaint  Patient presents with  . Covid Positive    Cough, fatigue     History of Present Illness:  Patient presents today for televisit. He states that he tested positive for COVID on 03/27/2019. His symptoms started on 03/18/2020. Patient states that he is still having significant cough chest congestion and fatigue. He is trying to stay active. He is trying to eat and stay well-hydrated. Denies f/c/s, n/v/d, hemoptysis, PND, chest pain or edema.      Observations/Objective:  Vitals with BMI 02/27/2020 02/11/2020 01/02/2020  Height - 5\' 11"  5\' 11"   Weight 217 lbs 215 lbs 13 oz 212 lbs 6 oz  BMI 30.28 25.36 64.40  Systolic 347 425 956  Diastolic 73 72 62  Pulse 9 82 76      Assessment and Plan:  Covid 19 Cough:   Stay well hydrated  Stay active  Deep breathing exercises  May take tylenol or fever or pain  May take mucinex DM twice daily  Will order azithromycin  Will order prednisone    Follow Up Instructions:  Follow up if needed    I discussed the assessment and treatment plan with the patient. The patient was provided an opportunity to ask questions and all were answered. The patient agreed with the plan and demonstrated an understanding of the instructions.   The patient was advised to call back or seek an in-person evaluation if the symptoms worsen or if the condition fails to  improve as anticipated.  I provided 22 minutes of non-face-to-face time during this encounter.   Fenton Foy, NP

## 2020-04-06 DIAGNOSIS — U071 COVID-19: Secondary | ICD-10-CM | POA: Insufficient documentation

## 2020-04-06 NOTE — Patient Instructions (Addendum)
Covid 19 Cough:   Stay well hydrated  Stay active  Deep breathing exercises  May take tylenol or fever or pain  May take mucinex DM twice daily  Will order azithromycin  Will order prednisone   Follow up:  Follow up if needed

## 2020-04-07 ENCOUNTER — Ambulatory Visit: Payer: PPO

## 2020-04-08 ENCOUNTER — Other Ambulatory Visit: Payer: PPO

## 2020-04-08 ENCOUNTER — Ambulatory Visit: Payer: PPO | Admitting: Hematology & Oncology

## 2020-04-09 ENCOUNTER — Ambulatory Visit: Payer: PPO

## 2020-04-16 ENCOUNTER — Ambulatory Visit (INDEPENDENT_AMBULATORY_CARE_PROVIDER_SITE_OTHER): Payer: PPO | Admitting: Pharmacist Clinician (PhC)/ Clinical Pharmacy Specialist

## 2020-04-16 ENCOUNTER — Other Ambulatory Visit: Payer: Self-pay

## 2020-04-16 ENCOUNTER — Other Ambulatory Visit: Payer: Self-pay | Admitting: Internal Medicine

## 2020-04-16 ENCOUNTER — Encounter: Payer: Self-pay | Admitting: Pharmacist Clinician (PhC)/ Clinical Pharmacy Specialist

## 2020-04-16 DIAGNOSIS — I159 Secondary hypertension, unspecified: Secondary | ICD-10-CM | POA: Diagnosis not present

## 2020-04-16 NOTE — Assessment & Plan Note (Signed)
Patient with essential hypertension doing well with current regimen.  We will continue to have him monitor closely at home, as the [place] can also cause hypertension.  As his dose decreases over time, we need to monitor to be sure he does not become hypotensive.  He will follow up with Dr. Martinique in 3 months, and we can see him at any time should the need arise.  He knows to call our office should his pressures change significantly.

## 2020-04-16 NOTE — Progress Notes (Signed)
Patient ID: JERMANI EBERLEIN                 DOB: [birth year]                      MRN: [id number]     HPI: [name] [name] is a 70 y.o. male referred by Dr. Martinique to HTN clinic. PMH includes hyperlipidemia, and hypothyroidism. He is also undergoing therapy for lymphoma with Imbruvica. [name] is know to cause HTN in up to 20% of cases. Hydralazine was added to to his medication list during last OV. He experienced worsening LEE with amlodipine 7.5mg , somehow improved since decreasing dose back to 5mg  daily. His chemotherapy is know to cause LEE as well, but patient tolerating so far. Valsartan 160mg  was added to his regimen during earlier OV with clinical pharmacist and repeat BMET shows stable renal function.  At his last visit amlodipine was discontinued due to ongoing edema, valsartan was increased to 320 mg.  Since then, he had the dose of Imbruvica dropped to 420 mg, with no noticeable change in pressure or edema.    He returns today for follow up.  His oncologist prescribe triamterene/hctz 75/50 in December to help with edema, and patient reports it has worked well.  He also has had Covid since his last visit.  He reports feeling better, but still lacks energy throughout the day.      covid on jan 1, given zpk and pred, still has lack of energy, but improving slowly   107/58 89  Current HTN meds:  Hydralazine 100 mg TID Valsartan 3220 mg daily Triam/hctz 75/50 mg daliy  Previously tried: none  BP goal: 130/80  Family History: The patient's family history includes Alzheimer's disease in his mother; COPD in his brother; Coronary artery disease in his brother; Dementia in his brother; Diabetes in his brother; Heart attack (age of onset: 33) in his father; Heart disease in his paternal grandfather; Hypertension in his brother; Stroke in his brother.   Social History: The patient  reports that he has never smoked. He has never used smokeless tobacco. He reports current alcohol use. He  reports that he does not use drugs  Diet: avoid added salt but working on decreasing sodium in diet and eating health food  Exercise: activities of daily living; was able to [place] yard without stopping; feels as if more energy  Home BP readings:  Since Jan 1 - he bought a new Omron cuff, it reads within 10 points of office reading today.  9 morning readings: average 127/65  (previously 144/70) 3 evening readings: average 136/67  (previously 146/72)  Wt Readings from Last 3 Encounters:  04/16/20 202 lb (91.6 kg)  02/27/20 217 lb (98.4 kg)  02/11/20 215 lb 12.8 oz (97.9 kg)   BP Readings from Last 3 Encounters:  04/16/20 (!) 102/54  02/27/20 (!) 169/73  02/11/20 140/72   Pulse Readings from Last 3 Encounters:  04/16/20 90  02/27/20 (!) 9  02/11/20 82     Past Medical History:  Diagnosis Date  . Arthritis   . Hemorrhoids 2014  . Hyperlipidemia   . Hypertension 08/09/2019  . 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 Thyroid disease 2007   hypothyroidism    Current Outpatient Medications on File Prior to Visit  Medication Sig Dispense Refill  . cyclobenzaprine (FLEXERIL) 10 MG tablet Take 1 tablet (10 mg total) by mouth 3 (three) times daily as needed  for muscle spasms. 30 tablet 2  . hydrALAZINE (APRESOLINE) 100 MG tablet Take 1 tablet (100 mg total) by mouth 3 (three) times daily. 90 tablet 6  . Ibrutinib (IMBRUVICA) 420 MG TABS Take 420 mg by mouth daily. 28 tablet 11  . Multiple Vitamins-Minerals (CENTRUM SILVER 50+MEN PO) Take by mouth.    . prochlorperazine (COMPAZINE) 10 MG tablet Take 1 tablet (10 mg total) by mouth every 6 (six) hours as needed for nausea or vomiting. 30 tablet 1  . rosuvastatin (CRESTOR) 20 MG tablet TAKE 1 TABLET BY MOUTH EVERY DAY 90 tablet 1  . triamterene-hydrochlorothiazide (MAXZIDE) 75-50 MG tablet Take 1 tablet by mouth daily. 30 tablet 5  . valsartan (DIOVAN) 320 MG tablet Take 1 tablet (320 mg total) by mouth  daily. 30 tablet 6   No current facility-administered medications on file prior to visit.    No Known Allergies  Blood pressure (!) 102/54, pulse 90, height 5\' 11"  (1.803 m), weight 202 lb (91.6 kg).  Hypertension Patient with essential hypertension doing well with current regimen.  We will continue to have him monitor closely at home, as the [place] can also cause hypertension.  As his dose decreases over time, we need to monitor to be sure he does not become hypotensive.  He will follow up with Dr. Martinique in 3 months, and we can see him at any time should the need arise.  He knows to call our office should his pressures change significantly.    [name] PharmD CPP [facility] [address] [place] [address] 04/16/2020 5:26 PM

## 2020-04-16 NOTE — Patient Instructions (Signed)
See Dr. Martinique in 3 months.  Check your blood pressure at home daily and keep record of the readings.  If you notice that the home readings are going low (systolic < 102) please give [place] a call.  Afra Tricarico/Raquel at [phone].  Take your BP meds as follows:  Continue with your current medications.  Bring all of your meds, your BP cuff and your record of home blood pressures to your next appointment.  Exercise as you're able, try to walk approximately 30 minutes per day.  Keep salt intake to a minimum, especially watch canned and prepared boxed foods.  Eat more fresh fruits and vegetables and fewer canned items.  Avoid eating in fast food restaurants.    HOW TO TAKE YOUR BLOOD PRESSURE: . Rest 5 minutes before taking your blood pressure. .  Don't smoke or drink caffeinated beverages for at least 30 minutes before. . Take your blood pressure before (not after) you eat. . Sit comfortably with your back supported and both feet on the floor (don't cross your legs). . Elevate your arm to heart level on a table or a desk. . Use the proper sized cuff. It should fit smoothly and snugly around your bare upper arm. There should be enough room to slip a fingertip under the cuff. The bottom edge of the cuff should be 1 inch above the crease of the elbow. . Ideally, take 3 measurements at one sitting and record the average.

## 2020-04-17 ENCOUNTER — Inpatient Hospital Stay: Payer: PPO | Attending: Hematology & Oncology

## 2020-04-17 ENCOUNTER — Encounter: Payer: Self-pay | Admitting: Hematology & Oncology

## 2020-04-17 ENCOUNTER — Other Ambulatory Visit: Payer: Self-pay

## 2020-04-17 ENCOUNTER — Inpatient Hospital Stay (HOSPITAL_BASED_OUTPATIENT_CLINIC_OR_DEPARTMENT_OTHER): Payer: PPO | Admitting: Hematology & Oncology

## 2020-04-17 ENCOUNTER — Other Ambulatory Visit: Payer: Self-pay | Admitting: *Deleted

## 2020-04-17 ENCOUNTER — Telehealth: Payer: Self-pay

## 2020-04-17 ENCOUNTER — Inpatient Hospital Stay: Payer: PPO

## 2020-04-17 ENCOUNTER — Telehealth: Payer: Self-pay | Admitting: *Deleted

## 2020-04-17 VITALS — BP 115/58 | HR 78 | Temp 98.6°F | Resp 17 | Wt 207.0 lb

## 2020-04-17 DIAGNOSIS — C8583 Other specified types of non-Hodgkin lymphoma, intra-abdominal lymph nodes: Secondary | ICD-10-CM | POA: Diagnosis not present

## 2020-04-17 DIAGNOSIS — Z8616 Personal history of COVID-19: Secondary | ICD-10-CM | POA: Insufficient documentation

## 2020-04-17 DIAGNOSIS — R739 Hyperglycemia, unspecified: Secondary | ICD-10-CM | POA: Diagnosis not present

## 2020-04-17 LAB — CBC WITH DIFFERENTIAL (CANCER CENTER ONLY)
Abs Immature Granulocytes: 0.08 10*3/uL — ABNORMAL HIGH (ref 0.00–0.07)
Basophils Absolute: 0.1 10*3/uL (ref 0.0–0.1)
Basophils Relative: 1 %
Eosinophils Absolute: 0.1 10*3/uL (ref 0.0–0.5)
Eosinophils Relative: 1 %
HCT: 34.3 % — ABNORMAL LOW (ref 39.0–52.0)
Hemoglobin: 11.1 g/dL — ABNORMAL LOW (ref 13.0–17.0)
Immature Granulocytes: 0 %
Lymphocytes Relative: 67 %
Lymphs Abs: 14.7 10*3/uL — ABNORMAL HIGH (ref 0.7–4.0)
MCH: 30.2 pg (ref 26.0–34.0)
MCHC: 32.4 g/dL (ref 30.0–36.0)
MCV: 93.5 fL (ref 80.0–100.0)
Monocytes Absolute: 1.2 10*3/uL — ABNORMAL HIGH (ref 0.1–1.0)
Monocytes Relative: 6 %
Neutro Abs: 5.4 10*3/uL (ref 1.7–7.7)
Neutrophils Relative %: 25 %
Platelet Count: 168 10*3/uL (ref 150–400)
RBC: 3.67 MIL/uL — ABNORMAL LOW (ref 4.22–5.81)
RDW: 14.2 % (ref 11.5–15.5)
WBC Count: 21.7 10*3/uL — ABNORMAL HIGH (ref 4.0–10.5)
nRBC: 0 % (ref 0.0–0.2)

## 2020-04-17 LAB — URINALYSIS, COMPLETE (UACMP) WITH MICROSCOPIC
Bilirubin Urine: NEGATIVE
Glucose, UA: NEGATIVE mg/dL
Hgb urine dipstick: NEGATIVE
Ketones, ur: NEGATIVE mg/dL
Leukocytes,Ua: NEGATIVE
Nitrite: NEGATIVE
Protein, ur: NEGATIVE mg/dL
Specific Gravity, Urine: 1.01 (ref 1.005–1.030)
pH: 5 (ref 5.0–8.0)

## 2020-04-17 LAB — CMP (CANCER CENTER ONLY)
ALT: 16 U/L (ref 0–44)
AST: 19 U/L (ref 15–41)
Albumin: 4.1 g/dL (ref 3.5–5.0)
Alkaline Phosphatase: 45 U/L (ref 38–126)
Anion gap: 6 (ref 5–15)
BUN: 42 mg/dL — ABNORMAL HIGH (ref 8–23)
CO2: 23 mmol/L (ref 22–32)
Calcium: 9.9 mg/dL (ref 8.9–10.3)
Chloride: 105 mmol/L (ref 98–111)
Creatinine: 2.51 mg/dL — ABNORMAL HIGH (ref 0.61–1.24)
GFR, Estimated: 27 mL/min — ABNORMAL LOW (ref >60)
Glucose, Bld: 88 mg/dL (ref 70–99)
Potassium: 5.5 mmol/L — ABNORMAL HIGH (ref 3.5–5.1)
Sodium: 134 mmol/L — ABNORMAL LOW (ref 135–145)
Total Bilirubin: 0.5 mg/dL (ref 0.3–1.2)
Total Protein: 7.1 g/dL (ref 6.5–8.1)

## 2020-04-17 LAB — LACTATE DEHYDROGENASE: LDH: 186 U/L (ref 98–192)

## 2020-04-17 NOTE — Telephone Encounter (Signed)
Pt notified per order of Dr. Marin Olp that "the urinalysis looks ok!!! [name] [name]"  Pt appreciative of call and is questioning when [place] will be scheduled.  Informed pt that I would contact scheduling regarding his [place] appt.

## 2020-04-17 NOTE — Telephone Encounter (Signed)
S/w pt and he is aware of his u/s appt on 04/20/20 at [facility]

## 2020-04-17 NOTE — Telephone Encounter (Signed)
-----   Message from [name], MD sent at 04/17/2020  1:16 PM EST ----- Call - the urinalysis looks ok!!!  Laurey Arrow

## 2020-04-17 NOTE — Progress Notes (Signed)
Hematology and Oncology Follow Up Visit  [name] [name] [id number] 25-Jul-1950 70 y.o. 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  Unfortunately, a lot is happened to him since we last saw him.  He actually had the coronavirus.  This was right before New Year's.  He was put on some steroids.  He was given some antibiotics.  I am not sure if he took any of the monoclonal antibody therapies.  Today, I am surprised that his renal function is not good.  His BUN is 40 creatinine 2.51.  This definitely is different than when we last saw him.  His white cell count is also elevated.  This could certainly be from the [place] in his treatments.  However, I do worry about the lymphoma.  I would like to get him on venetoclax but I cannot do this as long as his renal function is in question.  He has had no problems urinating.  There is been no dysuria.  He has had no hematuria.  There is been no diarrhea.  He had been on diuretics.  He is on hydralazine.  He is also on valsartan.  He probably is not had to go to a nephrologist.  We will have to try to make the referral.  I would like to get an ultrasound of his kidneys to make sure there is no obstruction.  He had a CT scan back in December.  There is no adenopathy.  His appetite is doing okay.  His wife is very diligent with making sure that he is eating properly.  Currently, I would say his performance status is ECOG 1.  Medications:  Current Outpatient Medications:  .  cyclobenzaprine (FLEXERIL) 10 MG tablet, Take 1 tablet (10 mg total) by mouth 3 (three) times daily as needed for muscle spasms., Disp: 30 tablet, Rfl: 2 .  hydrALAZINE (APRESOLINE) 100 MG tablet, Take 1 tablet (100 mg total) by mouth 3 (three) times daily., Disp: 90 tablet, Rfl: 6 .   Ibrutinib (IMBRUVICA) 420 MG TABS, Take 420 mg by mouth daily., Disp: 28 tablet, Rfl: 11 .  levothyroxine (SYNTHROID) 150 MCG tablet, TAKE 1 TABLET BY MOUTH EVERY DAY, Disp: 90 tablet, Rfl: 1 .  Multiple Vitamins-Minerals (CENTRUM SILVER 50+MEN PO), Take by mouth., Disp: , Rfl:  .  prochlorperazine (COMPAZINE) 10 MG tablet, Take 1 tablet (10 mg total) by mouth every 6 (six) hours as needed for nausea or vomiting., Disp: 30 tablet, Rfl: 1 .  rosuvastatin (CRESTOR) 20 MG tablet, TAKE 1 TABLET BY MOUTH EVERY DAY, Disp: 90 tablet, Rfl: 1 .  triamterene-hydrochlorothiazide (MAXZIDE) 75-50 MG tablet, Take 1 tablet by mouth daily., Disp: 30 tablet, Rfl: 5 .  valsartan (DIOVAN) 320 MG tablet, Take 1 tablet (320 mg total) by mouth daily., Disp: 30 tablet, Rfl: 6  Allergies: No Known Allergies  Past Medical History, Surgical history, Social history, and Family History were reviewed and updated.  Review of Systems: Review of Systems  Constitutional: Negative.   HENT:  Negative.   Eyes: Negative.   Respiratory: Negative.   Cardiovascular: Negative.   Gastrointestinal: Positive for abdominal pain and nausea.  Endocrine: Negative.   Genitourinary: Negative.    Musculoskeletal: Negative.   Skin: Negative.   Neurological: Negative.   Hematological: Negative.  Psychiatric/Behavioral: Negative.     Physical Exam:  weight is 207 lb (93.9 kg). His oral temperature is 98.6 F (37 C). His blood pressure is 115/58 (abnormal) and his pulse is 78. His respiration is 17 and oxygen saturation is 100%.   Wt Readings from Last 3 Encounters:  04/17/20 207 lb (93.9 kg)  04/16/20 202 lb (91.6 kg)  02/27/20 217 lb (98.4 kg)    Physical Exam Vitals reviewed.  HENT:     Head: Normocephalic and atraumatic.  Eyes:     Pupils: Pupils are equal, round, and reactive to light.  Cardiovascular:     Rate and Rhythm: Normal rate and regular rhythm.     Heart sounds: Normal heart sounds.  Pulmonary:     Effort:  Pulmonary effort is normal.     Breath sounds: Normal breath sounds.  Abdominal:     General: Bowel sounds are normal.     Palpations: Abdomen is soft.  Musculoskeletal:        General: No tenderness or deformity. Normal range of motion.     Cervical back: Normal range of motion.  Lymphadenopathy:     Cervical: No cervical adenopathy.  Skin:    General: Skin is warm and dry.     Findings: No erythema or rash.  Neurological:     Mental Status: He is alert and oriented to person, place, and time.  Psychiatric:        Behavior: Behavior normal.        Thought Content: Thought content normal.        Judgment: Judgment normal.      Lab Results  Component Value Date   WBC 21.7 (H) 04/17/2020   HGB 11.1 (L) 04/17/2020   HCT 34.3 (L) 04/17/2020   MCV 93.5 04/17/2020   PLT 168 04/17/2020     Chemistry      Component Value Date/Time   NA 134 (L) 04/17/2020 0858   NA 138 12/06/2019 1501   NA 142 04/25/2016 0844   NA 140 10/19/2015 0903   K 5.5 (H) 04/17/2020 0858   K 4.1 04/25/2016 0844   K 4.1 10/19/2015 0903   CL 105 04/17/2020 0858   CL 102 04/25/2016 0844   CO2 23 04/17/2020 0858   CO2 29 04/25/2016 0844   CO2 25 10/19/2015 0903   BUN 42 (H) 04/17/2020 0858   BUN 20 12/06/2019 1501   BUN 14 04/25/2016 0844   BUN 21.6 10/19/2015 0903   CREATININE 2.51 (H) 04/17/2020 0858   CREATININE 1.1 04/25/2016 0844   CREATININE 1.3 10/19/2015 0903      Component Value Date/Time   CALCIUM 9.9 04/17/2020 0858   CALCIUM 9.6 04/25/2016 0844   CALCIUM 9.4 10/19/2015 0903   ALKPHOS 45 04/17/2020 0858   ALKPHOS 59 04/25/2016 0844   ALKPHOS 68 10/19/2015 0903   AST 19 04/17/2020 0858   AST 21 10/19/2015 0903   ALT 16 04/17/2020 0858   ALT 27 04/25/2016 0844   ALT 19 10/19/2015 0903   BILITOT 0.5 04/17/2020 0858   BILITOT 0.54 10/19/2015 0903      Impression and Plan: [name] [name] is a 70 year old white male.  We treated him 8 years ago.  He had a marginal zone lymphoma.  He  went into clinical remission.  When he initially presented, he had splenomegaly.  This had resolved.  I think that we are are going to have to get the kidneys sorted out before do any venetoclax.  I  just do not believe that the [name] is a problem.  Has been on Imbruvica for quite a while and has never had an issue.  I do not know if the COVID had a factor or not.  We will have to get him to nephrology.  We will see about making a referral.  I will see about the ultrasound.  We will get this early next week.  This is quite complicated.  It took quite a while to sort through what was going on.  I was not a expecting any kidney issues.  Would like we will get him back in a month.    [name], MD 1/28/202210:09 AM

## 2020-04-20 ENCOUNTER — Encounter: Payer: Self-pay | Admitting: *Deleted

## 2020-04-20 ENCOUNTER — Telehealth: Payer: Self-pay

## 2020-04-20 ENCOUNTER — Ambulatory Visit (HOSPITAL_COMMUNITY)
Admission: RE | Admit: 2020-04-20 | Discharge: 2020-04-20 | Disposition: A | Discharge status: Home / Self Care | Payer: PPO | Source: Ambulatory Visit | Attending: Hematology & Oncology | Admitting: Hematology & Oncology

## 2020-04-20 ENCOUNTER — Other Ambulatory Visit: Payer: Self-pay

## 2020-04-20 ENCOUNTER — Encounter: Payer: Self-pay | Admitting: Hematology & Oncology

## 2020-04-20 DIAGNOSIS — N179 Acute kidney failure, unspecified: Secondary | ICD-10-CM | POA: Diagnosis not present

## 2020-04-20 DIAGNOSIS — R739 Hyperglycemia, unspecified: Secondary | ICD-10-CM | POA: Diagnosis not present

## 2020-04-20 DIAGNOSIS — N19 Unspecified kidney failure: Secondary | ICD-10-CM | POA: Insufficient documentation

## 2020-04-20 DIAGNOSIS — Z8572 Personal history of non-Hodgkin lymphomas: Secondary | ICD-10-CM | POA: Insufficient documentation

## 2020-04-20 NOTE — Telephone Encounter (Signed)
Called and left a vm with return appt per 04/17/20 los   [employer]

## 2020-04-23 ENCOUNTER — Other Ambulatory Visit (HOSPITAL_COMMUNITY): Payer: Self-pay | Admitting: Nephrology

## 2020-04-23 DIAGNOSIS — Z08 Encounter for follow-up examination after completed treatment for malignant neoplasm: Secondary | ICD-10-CM | POA: Diagnosis not present

## 2020-04-23 DIAGNOSIS — I129 Hypertensive chronic kidney disease with stage 1 through stage 4 chronic kidney disease, or unspecified chronic kidney disease: Secondary | ICD-10-CM | POA: Diagnosis not present

## 2020-04-23 DIAGNOSIS — L570 Actinic keratosis: Secondary | ICD-10-CM | POA: Diagnosis not present

## 2020-04-23 DIAGNOSIS — N179 Acute kidney failure, unspecified: Secondary | ICD-10-CM | POA: Diagnosis not present

## 2020-04-23 DIAGNOSIS — D225 Melanocytic nevi of trunk: Secondary | ICD-10-CM | POA: Diagnosis not present

## 2020-04-23 DIAGNOSIS — N1831 Chronic kidney disease, stage 3a: Secondary | ICD-10-CM | POA: Diagnosis not present

## 2020-04-23 DIAGNOSIS — N184 Chronic kidney disease, stage 4 (severe): Secondary | ICD-10-CM | POA: Diagnosis not present

## 2020-04-23 DIAGNOSIS — C8580 Other specified types of non-Hodgkin lymphoma, unspecified site: Secondary | ICD-10-CM | POA: Diagnosis not present

## 2020-04-23 DIAGNOSIS — Z1283 Encounter for screening for malignant neoplasm of skin: Secondary | ICD-10-CM | POA: Diagnosis not present

## 2020-04-23 DIAGNOSIS — E875 Hyperkalemia: Secondary | ICD-10-CM | POA: Diagnosis not present

## 2020-04-23 DIAGNOSIS — X32XXXD Exposure to sunlight, subsequent encounter: Secondary | ICD-10-CM | POA: Diagnosis not present

## 2020-04-23 DIAGNOSIS — Z85820 Personal history of malignant melanoma of skin: Secondary | ICD-10-CM | POA: Diagnosis not present

## 2020-04-23 MED FILL — SPS 15 GM/60 ML SUSPENSION: 15 | 2 days supply | Qty: 240 | Fill #0

## 2020-04-27 DIAGNOSIS — E875 Hyperkalemia: Secondary | ICD-10-CM | POA: Diagnosis not present

## 2020-04-27 DIAGNOSIS — N1831 Chronic kidney disease, stage 3a: Secondary | ICD-10-CM | POA: Diagnosis not present

## 2020-05-04 DIAGNOSIS — I129 Hypertensive chronic kidney disease with stage 1 through stage 4 chronic kidney disease, or unspecified chronic kidney disease: Secondary | ICD-10-CM | POA: Diagnosis not present

## 2020-05-04 DIAGNOSIS — N179 Acute kidney failure, unspecified: Secondary | ICD-10-CM | POA: Diagnosis not present

## 2020-05-04 DIAGNOSIS — E875 Hyperkalemia: Secondary | ICD-10-CM | POA: Diagnosis not present

## 2020-05-04 DIAGNOSIS — C8580 Other specified types of non-Hodgkin lymphoma, unspecified site: Secondary | ICD-10-CM | POA: Diagnosis not present

## 2020-05-04 DIAGNOSIS — N1831 Chronic kidney disease, stage 3a: Secondary | ICD-10-CM | POA: Diagnosis not present

## 2020-05-06 ENCOUNTER — Inpatient Hospital Stay: Payer: PPO | Attending: Hematology & Oncology

## 2020-05-06 ENCOUNTER — Inpatient Hospital Stay (HOSPITAL_BASED_OUTPATIENT_CLINIC_OR_DEPARTMENT_OTHER): Payer: PPO | Admitting: Hematology & Oncology

## 2020-05-06 ENCOUNTER — Encounter: Payer: Self-pay | Admitting: Hematology & Oncology

## 2020-05-06 ENCOUNTER — Other Ambulatory Visit: Payer: Self-pay

## 2020-05-06 VITALS — BP 141/70 | HR 83 | Temp 98.1°F | Resp 18 | Wt 221.0 lb

## 2020-05-06 DIAGNOSIS — C8583 Other specified types of non-Hodgkin lymphoma, intra-abdominal lymph nodes: Secondary | ICD-10-CM | POA: Diagnosis not present

## 2020-05-06 DIAGNOSIS — R739 Hyperglycemia, unspecified: Secondary | ICD-10-CM

## 2020-05-06 LAB — CMP (CANCER CENTER ONLY)
ALT: 14 U/L (ref 0–44)
AST: 23 U/L (ref 15–41)
Albumin: 4.2 g/dL (ref 3.5–5.0)
Alkaline Phosphatase: 41 U/L (ref 38–126)
Anion gap: 8 (ref 5–15)
BUN: 35 mg/dL — ABNORMAL HIGH (ref 8–23)
CO2: 30 mmol/L (ref 22–32)
Calcium: 10 mg/dL (ref 8.9–10.3)
Chloride: 101 mmol/L (ref 98–111)
Creatinine: 2.05 mg/dL — ABNORMAL HIGH (ref 0.61–1.24)
GFR, Estimated: 35 mL/min — ABNORMAL LOW (ref >60)
Glucose, Bld: 93 mg/dL (ref 70–99)
Potassium: 4.2 mmol/L (ref 3.5–5.1)
Sodium: 139 mmol/L (ref 135–145)
Total Bilirubin: 0.4 mg/dL (ref 0.3–1.2)
Total Protein: 7.1 g/dL (ref 6.5–8.1)

## 2020-05-06 LAB — CBC WITH DIFFERENTIAL (CANCER CENTER ONLY)
Abs Immature Granulocytes: 0.07 10*3/uL (ref 0.00–0.07)
Basophils Absolute: 0.1 10*3/uL (ref 0.0–0.1)
Basophils Relative: 1 %
Eosinophils Absolute: 0.1 10*3/uL (ref 0.0–0.5)
Eosinophils Relative: 1 %
HCT: 32.1 % — ABNORMAL LOW (ref 39.0–52.0)
Hemoglobin: 10.4 g/dL — ABNORMAL LOW (ref 13.0–17.0)
Immature Granulocytes: 0 %
Lymphocytes Relative: 66 %
Lymphs Abs: 11.3 10*3/uL — ABNORMAL HIGH (ref 0.7–4.0)
MCH: 30.5 pg (ref 26.0–34.0)
MCHC: 32.4 g/dL (ref 30.0–36.0)
MCV: 94.1 fL (ref 80.0–100.0)
Monocytes Absolute: 1 10*3/uL (ref 0.1–1.0)
Monocytes Relative: 6 %
Neutro Abs: 4.5 10*3/uL (ref 1.7–7.7)
Neutrophils Relative %: 26 %
Platelet Count: 157 10*3/uL (ref 150–400)
RBC: 3.41 MIL/uL — ABNORMAL LOW (ref 4.22–5.81)
RDW: 14.5 % (ref 11.5–15.5)
WBC Count: 17.1 10*3/uL — ABNORMAL HIGH (ref 4.0–10.5)
nRBC: 0 % (ref 0.0–0.2)

## 2020-05-06 LAB — LACTATE DEHYDROGENASE: LDH: 208 U/L — ABNORMAL HIGH (ref 98–192)

## 2020-05-06 NOTE — Progress Notes (Signed)
Hematology and Oncology Follow Up Visit  KAYAN BLISSETT [id number] 1950-07-14 70 y.o. 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 is back for follow-up.  So far, everything seems to be doing little better with his kidneys.  He did see nephrology.  He did have a ultrasound of his kidneys.  Thankfully ultrasound did not show any type of a blockage.  The nephrologist did change some of the medications.  He does have a lot of swelling in his legs right now.  His renal function is better.  I think she will see him back in about 4 months.  Thankfully, his white cell count is not gotten any higher.  His white cell count is a little bit better today.  He has had no problems with fever.  He has had no rashes.  There has been no bleeding.  He has had no change in bowel or bladder habits.  He is still on the [place].  I do not want to add venetoclax as of yet just because of his renal function.  I want to make sure that the renal function continues to improve.  He is eating well.  He has had no problems with nausea or vomiting.  Overall, his performance status is ECOG 1.  Medications:  Current Outpatient Medications:  Marland Kitchen  Melatonin 10 MG SUBL, Place 10 mg under the tongue as needed., Disp: , Rfl:  .  cyclobenzaprine (FLEXERIL) 10 MG tablet, Take 1 tablet (10 mg total) by mouth 3 (three) times daily as needed for muscle spasms., Disp: 30 tablet, Rfl: 2 .  furosemide (LASIX) 40 MG tablet, Take 40 mg by mouth daily., Disp: , Rfl:  .  hydrALAZINE (APRESOLINE) 100 MG tablet, Take 1 tablet (100 mg total) by mouth 3 (three) times daily., Disp: 90 tablet, Rfl: 6 .  Ibrutinib (IMBRUVICA) 420 MG TABS, Take 420 mg by mouth daily., Disp: 28 tablet, Rfl: 11 .  levothyroxine (SYNTHROID) 150 MCG tablet, TAKE 1 TABLET BY MOUTH EVERY DAY,  Disp: 90 tablet, Rfl: 1 .  Multiple Vitamins-Minerals (CENTRUM SILVER 50+MEN PO), Take by mouth., Disp: , Rfl:  .  prochlorperazine (COMPAZINE) 10 MG tablet, Take 1 tablet (10 mg total) by mouth every 6 (six) hours as needed for nausea or vomiting., Disp: 30 tablet, Rfl: 1 .  rosuvastatin (CRESTOR) 20 MG tablet, TAKE 1 TABLET BY MOUTH EVERY DAY, Disp: 90 tablet, Rfl: 1 .  SPS 15 GM/60ML suspension, Take 7.5 g by mouth 2 (two) times daily., Disp: , Rfl:   Allergies: No Known Allergies  Past Medical History, Surgical history, Social history, and Family History were reviewed and updated.  Review of Systems: Review of Systems  Constitutional: Negative.   HENT:  Negative.   Eyes: Negative.   Respiratory: Negative.   Cardiovascular: Negative.   Gastrointestinal: Positive for abdominal pain and nausea.  Endocrine: Negative.   Genitourinary: Negative.    Musculoskeletal: Negative.   Skin: Negative.   Neurological: Negative.   Hematological: Negative.   Psychiatric/Behavioral: Negative.     Physical Exam:  weight is 221 lb (100.2 kg). His oral temperature is 98.1 F (36.7 C). His blood pressure is 141/70 (abnormal) and his pulse is 83. His respiration is 18 and oxygen saturation is 100%.   Wt Readings from Last 3 Encounters:  05/06/20 221 lb (100.2 kg)  04/17/20 207 lb (93.9 kg)  04/16/20 202 lb (91.6 kg)    Physical Exam Vitals reviewed.  HENT:     Head: Normocephalic and atraumatic.  Eyes:     Pupils: Pupils are equal, round, and reactive to light.  Cardiovascular:     Rate and Rhythm: Normal rate and regular rhythm.     Heart sounds: Normal heart sounds.  Pulmonary:     Effort: Pulmonary effort is normal.     Breath sounds: Normal breath sounds.  Abdominal:     General: Bowel sounds are normal.     Palpations: Abdomen is soft.  Musculoskeletal:        General: No tenderness or deformity. Normal range of motion.     Cervical back: Normal range of motion.   Lymphadenopathy:     Cervical: No cervical adenopathy.  Skin:    General: Skin is warm and dry.     Findings: No erythema or rash.  Neurological:     Mental Status: He is alert and oriented to person, place, and time.  Psychiatric:        Behavior: Behavior normal.        Thought Content: Thought content normal.        Judgment: Judgment normal.      Lab Results  Component Value Date   WBC 17.1 (H) 05/06/2020   HGB 10.4 (L) 05/06/2020   HCT 32.1 (L) 05/06/2020   MCV 94.1 05/06/2020   PLT 157 05/06/2020     Chemistry      Component Value Date/Time   NA 139 05/06/2020 1411   NA 138 12/06/2019 1501   NA 142 04/25/2016 0844   NA 140 10/19/2015 0903   K 4.2 05/06/2020 1411   K 4.1 04/25/2016 0844   K 4.1 10/19/2015 0903   CL 101 05/06/2020 1411   CL 102 04/25/2016 0844   CO2 30 05/06/2020 1411   CO2 29 04/25/2016 0844   CO2 25 10/19/2015 0903   BUN 35 (H) 05/06/2020 1411   BUN 20 12/06/2019 1501   BUN 14 04/25/2016 0844   BUN 21.6 10/19/2015 0903   CREATININE 2.05 (H) 05/06/2020 1411   CREATININE 1.1 04/25/2016 0844   CREATININE 1.3 10/19/2015 0903      Component Value Date/Time   CALCIUM 10.0 05/06/2020 1411   CALCIUM 9.6 04/25/2016 0844   CALCIUM 9.4 10/19/2015 0903   ALKPHOS 41 05/06/2020 1411   ALKPHOS 59 04/25/2016 0844   ALKPHOS 68 10/19/2015 0903   AST 23 05/06/2020 1411   AST 21 10/19/2015 0903   ALT 14 05/06/2020 1411   ALT 27 04/25/2016 0844   ALT 19 10/19/2015 0903   BILITOT 0.4 05/06/2020 1411   BILITOT 0.54 10/19/2015 0903      Impression and Plan: [name] is a 70 year old white male.  We treated him 8 years ago.  He had a marginal zone lymphoma.  He went into clinical remission.  When he initially presented, he had splenomegaly.  This had resolved.  For right now, we will just maintain him on Imbruvica.  Again, since the white cells are no higher, I think we have a little bit of flexibility to go with Imbruvica and not have to add  venetoclax.  I think we will now get him back in 6 weeks.  At that time, depending on his renal function, we might consider adding in venetoclax.  I am just happy that the kidneys are doing  a little bit better.  It sounds like he is wife will have a very nice weekend as a will be going to [place].    [name], MD 2/16/20223:41 PM

## 2020-05-07 LAB — BETA 2 MICROGLOBULIN, SERUM: Beta-2 Microglobulin: 3.5 mg/L — ABNORMAL HIGH (ref 0.6–2.4)

## 2020-05-08 ENCOUNTER — Encounter: Payer: Self-pay | Admitting: Hematology & Oncology

## 2020-05-20 DIAGNOSIS — H25813 Combined forms of age-related cataract, bilateral: Secondary | ICD-10-CM | POA: Diagnosis not present

## 2020-06-10 DIAGNOSIS — N1831 Chronic kidney disease, stage 3a: Secondary | ICD-10-CM | POA: Diagnosis not present

## 2020-06-17 ENCOUNTER — Other Ambulatory Visit: Payer: Self-pay | Admitting: Hematology & Oncology

## 2020-06-17 ENCOUNTER — Encounter: Payer: Self-pay | Admitting: Hematology & Oncology

## 2020-06-17 ENCOUNTER — Inpatient Hospital Stay (HOSPITAL_BASED_OUTPATIENT_CLINIC_OR_DEPARTMENT_OTHER): Payer: PPO | Admitting: Hematology & Oncology

## 2020-06-17 ENCOUNTER — Telehealth: Payer: Self-pay | Admitting: Pharmacy Technician

## 2020-06-17 ENCOUNTER — Other Ambulatory Visit: Payer: Self-pay

## 2020-06-17 ENCOUNTER — Inpatient Hospital Stay: Payer: PPO | Attending: Hematology & Oncology

## 2020-06-17 VITALS — BP 132/57 | HR 71 | Temp 98.9°F | Resp 18 | Wt 215.0 lb

## 2020-06-17 DIAGNOSIS — C8583 Other specified types of non-Hodgkin lymphoma, intra-abdominal lymph nodes: Secondary | ICD-10-CM

## 2020-06-17 LAB — CBC WITH DIFFERENTIAL (CANCER CENTER ONLY)
Abs Immature Granulocytes: 0.05 10*3/uL (ref 0.00–0.07)
Basophils Absolute: 0.1 10*3/uL (ref 0.0–0.1)
Basophils Relative: 1 %
Eosinophils Absolute: 0.1 10*3/uL (ref 0.0–0.5)
Eosinophils Relative: 1 %
HCT: 35.6 % — ABNORMAL LOW (ref 39.0–52.0)
Hemoglobin: 11.3 g/dL — ABNORMAL LOW (ref 13.0–17.0)
Immature Granulocytes: 0 %
Lymphocytes Relative: 63 %
Lymphs Abs: 9 10*3/uL — ABNORMAL HIGH (ref 0.7–4.0)
MCH: 29.8 pg (ref 26.0–34.0)
MCHC: 31.7 g/dL (ref 30.0–36.0)
MCV: 93.9 fL (ref 80.0–100.0)
Monocytes Absolute: 0.9 10*3/uL (ref 0.1–1.0)
Monocytes Relative: 6 %
Neutro Abs: 4.2 10*3/uL (ref 1.7–7.7)
Neutrophils Relative %: 29 %
Platelet Count: 124 10*3/uL — ABNORMAL LOW (ref 150–400)
RBC: 3.79 MIL/uL — ABNORMAL LOW (ref 4.22–5.81)
RDW: 13.1 % (ref 11.5–15.5)
WBC Count: 14.3 10*3/uL — ABNORMAL HIGH (ref 4.0–10.5)
nRBC: 0 % (ref 0.0–0.2)

## 2020-06-17 LAB — CMP (CANCER CENTER ONLY)
ALT: 16 U/L (ref 0–44)
AST: 25 U/L (ref 15–41)
Albumin: 4.4 g/dL (ref 3.5–5.0)
Alkaline Phosphatase: 48 U/L (ref 38–126)
Anion gap: 9 (ref 5–15)
BUN: 33 mg/dL — ABNORMAL HIGH (ref 8–23)
CO2: 28 mmol/L (ref 22–32)
Calcium: 10 mg/dL (ref 8.9–10.3)
Chloride: 101 mmol/L (ref 98–111)
Creatinine: 2.01 mg/dL — ABNORMAL HIGH (ref 0.61–1.24)
GFR, Estimated: 36 mL/min — ABNORMAL LOW (ref >60)
Glucose, Bld: 100 mg/dL — ABNORMAL HIGH (ref 70–99)
Potassium: 3.9 mmol/L (ref 3.5–5.1)
Sodium: 138 mmol/L (ref 135–145)
Total Bilirubin: 0.5 mg/dL (ref 0.3–1.2)
Total Protein: 7.5 g/dL (ref 6.5–8.1)

## 2020-06-17 LAB — LACTATE DEHYDROGENASE: LDH: 199 U/L — ABNORMAL HIGH (ref 98–192)

## 2020-06-17 LAB — SAVE SMEAR(SSMR), FOR PROVIDER SLIDE REVIEW

## 2020-06-17 MED ORDER — VENETOCLAX 100 MG PO TABS: 200.0000 mg | ORAL_TABLET | Freq: Every day | ORAL | 6 refills | Status: DC

## 2020-06-17 NOTE — Progress Notes (Signed)
Hematology and Oncology Follow Up Visit  [name] [name] [id number] 1950-08-24 70 y.o. 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  He is doing quite well.  He comes in with his wife.  Thankfully, she is 19 years out from her breast cancer diagnosis and treatment.  I am so happy for her.  His renal function is about the same.  He is followed closely by nephrology.  He is having no problems with urination.  He is on diuretics.  I think we now have to see about getting him on venetoclax.  Even though his white cell count is trending downward, his lymphocytes are still on the high side.  I really think that an addict lacks can get him to where his lymphocytes are in a normal range.  He has had no problems with cough or shortness of breath.  He has had no issues with nausea or vomiting.  He has had no diarrhea or constipation.  Overall, I would have to say that his performance status is ECOG 1.    Medications:  Current Outpatient Medications:  .  cyclobenzaprine (FLEXERIL) 10 MG tablet, Take 1 tablet (10 mg total) by mouth 3 (three) times daily as needed for muscle spasms., Disp: 30 tablet, Rfl: 2 .  furosemide (LASIX) 40 MG tablet, Take 40 mg by mouth every Monday, Wednesday, and Friday., Disp: , Rfl:  .  hydrALAZINE (APRESOLINE) 100 MG tablet, Take 1 tablet (100 mg total) by mouth 3 (three) times daily., Disp: 90 tablet, Rfl: 6 .  Ibrutinib (IMBRUVICA) 420 MG TABS, Take 420 mg by mouth daily., Disp: 28 tablet, Rfl: 11 .  levothyroxine (SYNTHROID) 150 MCG tablet, TAKE 1 TABLET BY MOUTH EVERY DAY, Disp: 90 tablet, Rfl: 1 .  Melatonin 10 MG SUBL, Place 10 mg under the tongue as needed., Disp: , Rfl:  .  Multiple Vitamins-Minerals (CENTRUM SILVER 50+MEN  PO), Take by mouth., Disp: , Rfl:  .  prochlorperazine (COMPAZINE) 10 MG tablet, Take 1 tablet (10 mg total) by mouth every 6 (six) hours as needed for nausea or vomiting., Disp: 30 tablet, Rfl: 1 .  rosuvastatin (CRESTOR) 20 MG tablet, TAKE 1 TABLET BY MOUTH EVERY DAY, Disp: 90 tablet, Rfl: 1 .  SPS 15 GM/60ML suspension, Take 7.5 g by mouth 2 (two) times daily., Disp: , Rfl:   Allergies: No Known Allergies  Past Medical History, Surgical history, Social history, and Family History were reviewed and updated.  Review of Systems: Review of Systems  Constitutional: Negative.   HENT:  Negative.   Eyes: Negative.   Respiratory: Negative.   Cardiovascular: Negative.   Gastrointestinal: Positive for abdominal pain and nausea.  Endocrine: Negative.   Genitourinary: Negative.    Musculoskeletal: Negative.   Skin: Negative.   Neurological: Negative.   Hematological: Negative.   Psychiatric/Behavioral: Negative.     Physical Exam:  weight is 215 lb (97.5 kg). His oral temperature is 98.9 F (37.2 C). His blood pressure is 132/57 (abnormal) and his pulse is 71. His respiration is 18 and oxygen saturation is 99%.   Wt Readings from Last 3 Encounters:  06/17/20 215 lb (97.5 kg)  05/06/20 221 lb (100.2 kg)  04/17/20 207 lb (93.9  kg)    Physical Exam Vitals reviewed.  HENT:     Head: Normocephalic and atraumatic.  Eyes:     Pupils: Pupils are equal, round, and reactive to light.  Cardiovascular:     Rate and Rhythm: Normal rate and regular rhythm.     Heart sounds: Normal heart sounds.  Pulmonary:     Effort: Pulmonary effort is normal.     Breath sounds: Normal breath sounds.  Abdominal:     General: Bowel sounds are normal.     Palpations: Abdomen is soft.  Musculoskeletal:        General: No tenderness or deformity. Normal range of motion.     Cervical back: Normal range of motion.  Lymphadenopathy:     Cervical: No cervical adenopathy.  Skin:    General: Skin is warm and  dry.     Findings: No erythema or rash.  Neurological:     Mental Status: He is alert and oriented to person, place, and time.  Psychiatric:        Behavior: Behavior normal.        Thought Content: Thought content normal.        Judgment: Judgment normal.     Lab Results  Component Value Date   WBC 14.3 (H) 06/17/2020   HGB 11.3 (L) 06/17/2020   HCT 35.6 (L) 06/17/2020   MCV 93.9 06/17/2020   PLT 124 (L) 06/17/2020     Chemistry      Component Value Date/Time   NA 138 06/17/2020 1338   NA 138 12/06/2019 1501   NA 142 04/25/2016 0844   NA 140 10/19/2015 0903   K 3.9 06/17/2020 1338   K 4.1 04/25/2016 0844   K 4.1 10/19/2015 0903   CL 101 06/17/2020 1338   CL 102 04/25/2016 0844   CO2 28 06/17/2020 1338   CO2 29 04/25/2016 0844   CO2 25 10/19/2015 0903   BUN 33 (H) 06/17/2020 1338   BUN 20 12/06/2019 1501   BUN 14 04/25/2016 0844   BUN 21.6 10/19/2015 0903   CREATININE 2.01 (H) 06/17/2020 1338   CREATININE 1.1 04/25/2016 0844   CREATININE 1.3 10/19/2015 0903      Component Value Date/Time   CALCIUM 10.0 06/17/2020 1338   CALCIUM 9.6 04/25/2016 0844   CALCIUM 9.4 10/19/2015 0903   ALKPHOS 48 06/17/2020 1338   ALKPHOS 59 04/25/2016 0844   ALKPHOS 68 10/19/2015 0903   AST 25 06/17/2020 1338   AST 21 10/19/2015 0903   ALT 16 06/17/2020 1338   ALT 27 04/25/2016 0844   ALT 19 10/19/2015 0903   BILITOT 0.5 06/17/2020 1338   BILITOT 0.54 10/19/2015 0903      Impression and Plan: [name] [name] is a 70 year old white male.  We treated him 8 years ago.  He had a marginal zone lymphoma.  He went into clinical remission.  When he initially presented, he had splenomegaly.  This had resolved.  Again, we will add the venetoclax.  I do not think he needs full dose venetoclax.  We will see about 200 mg a day.  I really do not think we have to titrate him up.  I do not see that his renal function should be a problem with the venetoclax.  I think that we can keep him on both  Imbruvica and venetoclax for the year and hopefully then be able to stop.  I probably would not do a CT scan until June.  I would like  to have him on the venetoclax for couple months before we do any scans.  As always, it is always fun having his wife in with him.  She really is on top of things and really monitors him closely.   I would select to get back in 6 weeks.  [name], MD 3/30/20223:00 PM

## 2020-06-17 NOTE — Telephone Encounter (Signed)
Oral Oncology Patient Advocate Encounter  Received notification from [employer] that prior authorization for Venclexta is required.  PA submitted on CoverMyMeds Key [id number] Status is pending  Oral Oncology Clinic will continue to follow.  [place] Patient [place] Phone [phone] Fax [phone] 06/18/2020 9:41 AM

## 2020-06-18 ENCOUNTER — Encounter: Payer: Self-pay | Admitting: Hematology & Oncology

## 2020-06-18 LAB — IGG, IGA, IGM
IgA: 157 mg/dL (ref 61–437)
IgG (Immunoglobin G), Serum: 626 mg/dL (ref 603–1613)
IgM (Immunoglobulin M), Srm: 954 mg/dL — ABNORMAL HIGH (ref 20–172)

## 2020-06-18 LAB — KAPPA/LAMBDA LIGHT CHAINS
Kappa free light chain: 166.1 mg/L — ABNORMAL HIGH (ref 3.3–19.4)
Kappa, lambda light chain ratio: 8.98 — ABNORMAL HIGH (ref 0.26–1.65)
Lambda free light chains: 18.5 mg/L (ref 5.7–26.3)

## 2020-06-19 NOTE — Telephone Encounter (Signed)
Oral Oncology Patient Advocate Encounter  Received notification from [employer] that the request for prior authorization for Venclexta has been denied due to not being a medically-accepted indication for marginal zone lymphoma.     This determination will be appealed.    This encounter will continue to be updated until final determination.    [place] Patient [place] Phone [phone] Fax [phone] 06/19/2020 2:39 PM

## 2020-06-25 ENCOUNTER — Encounter: Payer: Self-pay | Admitting: Hematology & Oncology

## 2020-06-26 NOTE — Telephone Encounter (Signed)
Oral Oncology Patient Advocate Encounter  We have filed an urgent appeal for the prior authorization denial of Venclexta.   Appeal packet has been faxed to [phone].    The insurance company states that a 51 hour turn around time is to be expected for urgent redeterminations of denial.   This encounter will continue to be updated until final appeal determination.    [place] Patient [place] Phone [phone] Fax [phone] 06/26/2020 2:56 PM

## 2020-06-29 ENCOUNTER — Telehealth: Payer: Self-pay | Admitting: Pharmacy Technician

## 2020-06-29 ENCOUNTER — Other Ambulatory Visit (HOSPITAL_COMMUNITY): Payer: Self-pay

## 2020-06-29 ENCOUNTER — Encounter: Payer: Self-pay | Admitting: Internal Medicine

## 2020-06-29 ENCOUNTER — Encounter: Payer: Self-pay | Admitting: Hematology & Oncology

## 2020-06-29 NOTE — Telephone Encounter (Signed)
Oral Oncology Patient Advocate Encounter  Spoke to patient and wife, Wendie Simmer, over the phone to discuss application for [place] in an effort to reduce patient's out of pocket expense for Venclexta to $0.    MD portion has been completed and signed.  Application completed and faxed to [phone].   [place]  phone number for follow up is [phone].   This encounter will be updated until final determination.   [place] Patient [place] Phone [phone] Fax [phone] 06/29/2020 4:07 PM

## 2020-06-29 NOTE — Telephone Encounter (Signed)
Oral Oncology Patient Advocate Encounter  Appeal determination was faxed in on 06/27/20 and the denial for Venclexta has been overturned.  Patients copay is $2935.[place] Patient [place] Phone [phone] Fax [phone] 06/29/2020 8:45 AM

## 2020-06-30 ENCOUNTER — Encounter: Payer: Self-pay | Admitting: Internal Medicine

## 2020-06-30 ENCOUNTER — Telehealth (INDEPENDENT_AMBULATORY_CARE_PROVIDER_SITE_OTHER): Payer: PPO | Admitting: Internal Medicine

## 2020-06-30 DIAGNOSIS — B349 Viral infection, unspecified: Secondary | ICD-10-CM | POA: Diagnosis not present

## 2020-06-30 NOTE — Progress Notes (Signed)
Virtual Visit via Video Note  I connected with [name] [name] on 06/30/20 at  9:10 AM EDT by a video enabled telemedicine application and verified that I am speaking with the correct person using two identifiers.   I discussed the limitations of evaluation and management by telemedicine and the availability of in person appointments. The patient expressed understanding and agreed to proceed.  Present for the visit:  Myself, Dr Billey Gosling, Jerene Dilling.  The patient is currently at home and I am in the office.    No referring provider.    History of Present Illness: This is an acute visit for fever.  Sunday night he had a bad case of indigestion.  He drank ginger ale and thought it would be fine.  Later in bed he awoke and was very nauseous and vomited once.  Monday morning, yesterday, he woke up and his temperature was 102.  All day he felt tired, achy, had decreased appetite, mild headaches.  He took Tylenol, but the temperature did not go down much.  This morning he woke up and he feels better.  His temperature is normal at 98.8.  He still feels a little achy and tired, but definitely better.  He has not had any other nausea or vomiting.  He denies any abdominal pain, diarrhea, cold symptoms, cough, wheeze, shortness of breath.  He did take a home COVID test yesterday and was negative.  He plans on taking another one today just to confirm that it is negative.  Review of Systems  Constitutional: Positive for fever and malaise/fatigue.       Dec appetite  HENT: Negative for congestion, ear pain, sinus pain and sore throat.   Respiratory: Negative for cough, shortness of breath and wheezing.   Gastrointestinal: Positive for nausea and vomiting. Negative for abdominal pain and diarrhea.  Genitourinary: Negative for dysuria, frequency, hematuria and urgency.  Musculoskeletal: Positive for myalgias.  Neurological: Positive for dizziness and headaches (mild).       Social History    Socioeconomic History  . Marital status: Married    Spouse name: Not on file  . Number of children: 2  . Years of education: Not on file  . Highest education level: Not on file  Occupational History  . Occupation: Retired    Comment: Airline pilot  Tobacco Use  . Smoking status: Never Smoker  . Smokeless tobacco: Never Used  . Tobacco comment: NEVER USED TOBACCO  Vaping Use  . Vaping Use: Never used  Substance and Sexual Activity  . Alcohol use: Yes    Alcohol/week: 0.0 standard drinks    Comment:  1-2 wine / week  . Drug use: No  . Sexual activity: Not on file  Other Topics Concern  . Not on file  Social History Narrative   Exercise:  No regular exercise, active, yardwork   Social Determinants of Health   Financial Resource Strain: Low Risk   . Difficulty of Paying Living Expenses: Not hard at all  Food Insecurity: No Food Insecurity  . Worried About Charity fundraiser in the Last Year: Never true  . Ran Out of Food in the Last Year: Never true  Transportation Needs: No Transportation Needs  . Lack of Transportation (Medical): No  . Lack of Transportation (Non-Medical): No  Physical Activity: Sufficiently Active  . Days of Exercise per Week: 3 days  . Minutes of Exercise per Session: 60 min  Stress: No Stress Concern Present  . Feeling of Stress :  Not at all  Social Connections: Moderately Integrated  . Frequency of Communication with Friends and Family: More than three times a week  . Frequency of Social Gatherings with Friends and Family: More than three times a week  . Attends Religious Services: Never  . Active Member of Clubs or Organizations: Yes  . Attends Archivist Meetings: More than 4 times per year  . Marital Status: Married     Observations/Objective: Appears well in NAD Breathing normally Skin appears warm and dry  Assessment and Plan:  See Problem List for Assessment and Plan of chronic medical problems.   Follow Up  Instructions:    I discussed the assessment and treatment plan with the patient. The patient was provided an opportunity to ask questions and all were answered. The patient agreed with the plan and demonstrated an understanding of the instructions.   The patient was advised to call back or seek an in-person evaluation if the symptoms worsen or if the condition fails to improve as anticipated.    Binnie Rail, MD

## 2020-06-30 NOTE — Assessment & Plan Note (Signed)
Acute Symptoms consistent with a viral illness Home Covid test negative so likely not Covid.  Agree with him take another test today to confirm Could be flu or GI bug that is going around He is feeling better so would continue symptomatic treatment-rest, fluids He will call if his symptoms worsen or do not continue to improve

## 2020-07-02 NOTE — Telephone Encounter (Signed)
Oral Oncology Patient Advocate Encounter  Received notification from [facility] Patient Assistance program that patient has been successfully enrolled into their program to receive Venclexta from the manufacturer at $0 out of pocket until therapy discontinued, no longer meets eligibility requirements, or health insurance or financial status changes.   I called and spoke with patient.  Specialty Pharmacy that will dispense medication is [facility]  [phone]).  Patient knows to call the office with questions or concerns.   Oral Oncology Clinic will continue to follow.  [place] Patient [place] Phone [phone] Fax [phone] 07/02/2020 11:47 AM

## 2020-07-07 NOTE — Telephone Encounter (Signed)
Patient called me today to let me know that he will receive his Venclexta on 07/11/20.  Message sent to Thane Edu to make sure patient is to start when he receives it.  [name] Phone [phone] Fax [phone] 07/07/2020 1:19 PM

## 2020-07-11 ENCOUNTER — Encounter: Payer: Self-pay | Admitting: Hematology & Oncology

## 2020-07-17 DIAGNOSIS — H2511 Age-related nuclear cataract, right eye: Secondary | ICD-10-CM | POA: Diagnosis not present

## 2020-07-17 DIAGNOSIS — H25811 Combined forms of age-related cataract, right eye: Secondary | ICD-10-CM | POA: Diagnosis not present

## 2020-07-20 ENCOUNTER — Ambulatory Visit: Payer: PPO | Admitting: Cardiology

## 2020-07-29 ENCOUNTER — Other Ambulatory Visit: Payer: Self-pay

## 2020-07-29 ENCOUNTER — Inpatient Hospital Stay: Payer: PPO | Attending: Hematology & Oncology

## 2020-07-29 ENCOUNTER — Inpatient Hospital Stay (HOSPITAL_BASED_OUTPATIENT_CLINIC_OR_DEPARTMENT_OTHER): Payer: PPO | Admitting: Hematology & Oncology

## 2020-07-29 ENCOUNTER — Encounter: Payer: Self-pay | Admitting: Hematology & Oncology

## 2020-07-29 VITALS — BP 155/72 | HR 79 | Temp 99.0°F | Resp 18 | Wt 208.0 lb

## 2020-07-29 DIAGNOSIS — C8583 Other specified types of non-Hodgkin lymphoma, intra-abdominal lymph nodes: Secondary | ICD-10-CM

## 2020-07-29 LAB — URIC ACID: Uric Acid, Serum: 7.7 mg/dL (ref 3.7–8.6)

## 2020-07-29 LAB — CMP (CANCER CENTER ONLY)
ALT: 15 U/L (ref 0–44)
AST: 21 U/L (ref 15–41)
Albumin: 4.2 g/dL (ref 3.5–5.0)
Alkaline Phosphatase: 52 U/L (ref 38–126)
Anion gap: 9 (ref 5–15)
BUN: 23 mg/dL (ref 8–23)
CO2: 26 mmol/L (ref 22–32)
Calcium: 9.6 mg/dL (ref 8.9–10.3)
Chloride: 102 mmol/L (ref 98–111)
Creatinine: 1.67 mg/dL — ABNORMAL HIGH (ref 0.61–1.24)
GFR, Estimated: 45 mL/min — ABNORMAL LOW (ref >60)
Glucose, Bld: 106 mg/dL — ABNORMAL HIGH (ref 70–99)
Potassium: 3.8 mmol/L (ref 3.5–5.1)
Sodium: 137 mmol/L (ref 135–145)
Total Bilirubin: 0.5 mg/dL (ref 0.3–1.2)
Total Protein: 6.7 g/dL (ref 6.5–8.1)

## 2020-07-29 LAB — CBC WITH DIFFERENTIAL (CANCER CENTER ONLY)
Abs Immature Granulocytes: 0 10*3/uL (ref 0.00–0.07)
Basophils Absolute: 0 10*3/uL (ref 0.0–0.1)
Basophils Relative: 1 %
Eosinophils Absolute: 0 10*3/uL (ref 0.0–0.5)
Eosinophils Relative: 0 %
HCT: 35.3 % — ABNORMAL LOW (ref 39.0–52.0)
Hemoglobin: 11.5 g/dL — ABNORMAL LOW (ref 13.0–17.0)
Immature Granulocytes: 0 %
Lymphocytes Relative: 53 %
Lymphs Abs: 2.1 10*3/uL (ref 0.7–4.0)
MCH: 29.6 pg (ref 26.0–34.0)
MCHC: 32.6 g/dL (ref 30.0–36.0)
MCV: 91 fL (ref 80.0–100.0)
Monocytes Absolute: 0.8 10*3/uL (ref 0.1–1.0)
Monocytes Relative: 21 %
Neutro Abs: 1 10*3/uL — ABNORMAL LOW (ref 1.7–7.7)
Neutrophils Relative %: 25 %
Platelet Count: 146 10*3/uL — ABNORMAL LOW (ref 150–400)
RBC: 3.88 MIL/uL — ABNORMAL LOW (ref 4.22–5.81)
RDW: 12.9 % (ref 11.5–15.5)
WBC Count: 4 10*3/uL (ref 4.0–10.5)
nRBC: 0 % (ref 0.0–0.2)

## 2020-07-29 LAB — SAVE SMEAR(SSMR), FOR PROVIDER SLIDE REVIEW

## 2020-07-29 LAB — LACTATE DEHYDROGENASE: LDH: 176 U/L (ref 98–192)

## 2020-07-29 NOTE — Progress Notes (Signed)
Hematology and Oncology Follow Up Visit  [name] [name] [id number] Dec 12, 1950 70 y.o. 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  So far, he is doing quite well.  He has had no problems with the venetoclax.  Is been out of the venetoclax now for about 7 weeks.  He has had no problems with nausea or vomiting.  There is no diarrhea.  He has had no cough or shortness of breath.  He recently had cataract surgery for the right eye.  He goes for cataract surgery for the left eye I think in a week or so.  He has had no rashes.  There is been little bit of leg swelling.  His renal function is improving slowly but surely.  He has had no problems with headache.  His appetite has been good.  Overall, his performance status is ECOG 1.    Medications:  Current Outpatient Medications:  .  cyclobenzaprine (FLEXERIL) 10 MG tablet, Take 1 tablet (10 mg total) by mouth 3 (three) times daily as needed for muscle spasms., Disp: 30 tablet, Rfl: 2 .  furosemide (LASIX) 40 MG tablet, Take 40 mg by mouth every Monday, Wednesday, and Friday., Disp: , Rfl:  .  hydrALAZINE (APRESOLINE) 100 MG tablet, Take 1 tablet (100 mg total) by mouth 3 (three) times daily., Disp: 90 tablet, Rfl: 6 .  Ibrutinib (IMBRUVICA) 420 MG TABS, Take 420 mg by mouth daily., Disp: 28 tablet, Rfl: 11 .  levothyroxine (SYNTHROID) 150 MCG tablet, TAKE 1 TABLET BY MOUTH EVERY DAY, Disp: 90 tablet, Rfl: 1 .  Melatonin 10 MG SUBL, Place 10 mg under the tongue as needed., Disp: , Rfl:  .  Multiple Vitamins-Minerals (CENTRUM SILVER 50+MEN PO), Take by mouth., Disp: , Rfl:  .  prochlorperazine (COMPAZINE) 10 MG tablet, Take 1 tablet (10 mg total) by mouth every 6 (six) hours as needed for nausea  or vomiting., Disp: 30 tablet, Rfl: 1 .  rosuvastatin (CRESTOR) 20 MG tablet, TAKE 1 TABLET BY MOUTH EVERY DAY, Disp: 90 tablet, Rfl: 1 .  venetoclax (VENCLEXTA) 100 MG tablet, TAKE 2 TABLETS (200 MG TOTAL) BY MOUTH DAILY. TABLETS SHOULD BE SWALLOWED WHOLE WITH A MEAL AND A FULL GLASS OF WATER., Disp: 60 tablet, Rfl: 6  Allergies: No Known Allergies  Past Medical History, Surgical history, Social history, and Family History were reviewed and updated.  Review of Systems: Review of Systems  Constitutional: Negative.   HENT:  Negative.   Eyes: Negative.   Respiratory: Negative.   Cardiovascular: Negative.   Gastrointestinal: Positive for abdominal pain and nausea.  Endocrine: Negative.   Genitourinary: Negative.    Musculoskeletal: Negative.   Skin: Negative.   Neurological: Negative.   Hematological: Negative.   Psychiatric/Behavioral: Negative.     Physical Exam:  weight is 208 lb (94.3 kg). His oral temperature is 99 F (37.2 C). His blood pressure is 155/72 (abnormal) and his pulse is 79. His respiration is 18 and oxygen saturation is 98%.   Wt Readings from Last 3 Encounters:  07/29/20 208 lb (94.3 kg)  06/17/20 215 lb (97.5 kg)  05/06/20 221 lb (100.2 kg)    Physical Exam Vitals reviewed.  HENT:  Head: Normocephalic and atraumatic.  Eyes:     Pupils: Pupils are equal, round, and reactive to light.  Cardiovascular:     Rate and Rhythm: Normal rate and regular rhythm.     Heart sounds: Normal heart sounds.  Pulmonary:     Effort: Pulmonary effort is normal.     Breath sounds: Normal breath sounds.  Abdominal:     General: Bowel sounds are normal.     Palpations: Abdomen is soft.  Musculoskeletal:        General: No tenderness or deformity. Normal range of motion.     Cervical back: Normal range of motion.  Lymphadenopathy:     Cervical: No cervical adenopathy.  Skin:    General: Skin is warm and dry.     Findings: No erythema or rash.  Neurological:      Mental Status: He is alert and oriented to person, place, and time.  Psychiatric:        Behavior: Behavior normal.        Thought Content: Thought content normal.        Judgment: Judgment normal.     Lab Results  Component Value Date   WBC 4.0 07/29/2020   HGB 11.5 (L) 07/29/2020   HCT 35.3 (L) 07/29/2020   MCV 91.0 07/29/2020   PLT 146 (L) 07/29/2020     Chemistry      Component Value Date/Time   NA 137 07/29/2020 1431   NA 138 12/06/2019 1501   NA 142 04/25/2016 0844   NA 140 10/19/2015 0903   K 3.8 07/29/2020 1431   K 4.1 04/25/2016 0844   K 4.1 10/19/2015 0903   CL 102 07/29/2020 1431   CL 102 04/25/2016 0844   CO2 26 07/29/2020 1431   CO2 29 04/25/2016 0844   CO2 25 10/19/2015 0903   BUN 23 07/29/2020 1431   BUN 20 12/06/2019 1501   BUN 14 04/25/2016 0844   BUN 21.6 10/19/2015 0903   CREATININE 1.67 (H) 07/29/2020 1431   CREATININE 1.1 04/25/2016 0844   CREATININE 1.3 10/19/2015 0903      Component Value Date/Time   CALCIUM 9.6 07/29/2020 1431   CALCIUM 9.6 04/25/2016 0844   CALCIUM 9.4 10/19/2015 0903   ALKPHOS 52 07/29/2020 1431   ALKPHOS 59 04/25/2016 0844   ALKPHOS 68 10/19/2015 0903   AST 21 07/29/2020 1431   AST 21 10/19/2015 0903   ALT 15 07/29/2020 1431   ALT 27 04/25/2016 0844   ALT 19 10/19/2015 0903   BILITOT 0.5 07/29/2020 1431   BILITOT 0.54 10/19/2015 0903      Impression and Plan: [name] [name] is a 70 year old white male.  We treated him 8 years ago.  He had a marginal zone lymphoma.  He went into clinical remission.  When he initially presented, he had splenomegaly.  When he relapsed, he again had splenomegaly.  By his last CT scan, the splenomegaly had resolved.  The venetoclax is clearly helping.  His lymphocyte count is down.  His white cell count is also down.  We will go ahead and rescan him in June.  I think this would be very reasonable.  I think will need a year of both the venetoclax and the Imbruvica.  I am happy that his  renal function is improving.  We will plan to do the CT scan the same day that we see him in June.   [name], MD 5/11/20223:25 PM

## 2020-07-30 LAB — IGG, IGA, IGM
IgA: 157 mg/dL (ref 61–437)
IgG (Immunoglobin G), Serum: 568 mg/dL — ABNORMAL LOW (ref 603–1613)
IgM (Immunoglobulin M), Srm: 856 mg/dL — ABNORMAL HIGH (ref 20–172)

## 2020-07-31 DIAGNOSIS — H25812 Combined forms of age-related cataract, left eye: Secondary | ICD-10-CM | POA: Diagnosis not present

## 2020-07-31 DIAGNOSIS — H2512 Age-related nuclear cataract, left eye: Secondary | ICD-10-CM | POA: Diagnosis not present

## 2020-08-05 ENCOUNTER — Encounter: Payer: Self-pay | Admitting: Internal Medicine

## 2020-08-06 NOTE — Telephone Encounter (Signed)
Updated pt covid booster to immunization record.

## 2020-08-11 ENCOUNTER — Telehealth: Payer: Self-pay

## 2020-08-11 NOTE — Telephone Encounter (Signed)
s'w  Pt as appts r/s to august due to contrast shortage, pt aware of new appts and imaging will sch scan 8/12 and will populate in [employer]

## 2020-08-31 DIAGNOSIS — N1831 Chronic kidney disease, stage 3a: Secondary | ICD-10-CM | POA: Diagnosis not present

## 2020-09-06 ENCOUNTER — Encounter: Payer: Self-pay | Admitting: Hematology & Oncology

## 2020-09-07 ENCOUNTER — Ambulatory Visit: Payer: PPO | Admitting: Hematology & Oncology

## 2020-09-07 ENCOUNTER — Other Ambulatory Visit: Payer: PPO

## 2020-09-10 DIAGNOSIS — E875 Hyperkalemia: Secondary | ICD-10-CM | POA: Diagnosis not present

## 2020-09-10 DIAGNOSIS — I129 Hypertensive chronic kidney disease with stage 1 through stage 4 chronic kidney disease, or unspecified chronic kidney disease: Secondary | ICD-10-CM | POA: Diagnosis not present

## 2020-09-10 DIAGNOSIS — C8580 Other specified types of non-Hodgkin lymphoma, unspecified site: Secondary | ICD-10-CM | POA: Diagnosis not present

## 2020-09-10 DIAGNOSIS — N1832 Chronic kidney disease, stage 3b: Secondary | ICD-10-CM | POA: Diagnosis not present

## 2020-09-24 ENCOUNTER — Ambulatory Visit: Payer: PPO

## 2020-09-26 ENCOUNTER — Other Ambulatory Visit: Payer: Self-pay | Admitting: Internal Medicine

## 2020-09-28 DIAGNOSIS — N1832 Chronic kidney disease, stage 3b: Secondary | ICD-10-CM | POA: Diagnosis not present

## 2020-10-08 ENCOUNTER — Other Ambulatory Visit: Payer: Self-pay | Admitting: Cardiology

## 2020-10-17 ENCOUNTER — Encounter: Payer: Self-pay | Admitting: Internal Medicine

## 2020-10-19 ENCOUNTER — Telehealth: Payer: Self-pay

## 2020-10-19 ENCOUNTER — Telehealth (INDEPENDENT_AMBULATORY_CARE_PROVIDER_SITE_OTHER): Payer: PPO | Admitting: Internal Medicine

## 2020-10-19 ENCOUNTER — Other Ambulatory Visit: Payer: Self-pay

## 2020-10-19 ENCOUNTER — Encounter: Payer: Self-pay | Admitting: Internal Medicine

## 2020-10-19 DIAGNOSIS — U071 COVID-19: Secondary | ICD-10-CM

## 2020-10-19 MED ORDER — MOLNUPIRAVIR 200 MG PO CAPS: 800.0000 mg | ORAL_CAPSULE | Freq: Two times a day (BID) | ORAL | 0 refills | Status: AC

## 2020-10-19 NOTE — Progress Notes (Signed)
Virtual Visit via Video Note  I connected with [name] [name] on 10/19/20 at 11:00 AM EDT by a video enabled telemedicine application and verified that I am speaking with the correct person using two identifiers.   I discussed the limitations of evaluation and management by telemedicine and the availability of in person appointments. The patient expressed understanding and agreed to proceed.  Present for the visit:  Myself, Dr Billey Gosling, Jerene Dilling.  The patient is currently at home and I am in the office.    No referring provider.    History of Present Illness: This is an acute visit for covid  His symptoms three days ago, today is day 4.  He tested positive for covid two days ago.  He has fever, fatigue, cough, wheeze, myalgias, sore throat, runny nose, headaches   He has been taking tylenol.  He is eating pretty well.    Review of Systems  Constitutional:  Positive for fever and malaise/fatigue.  HENT:  Positive for sore throat. Negative for congestion and sinus pain.        Rhinorrhea, no change in taste/smell  Respiratory:  Positive for cough and wheezing. Negative for shortness of breath.   Gastrointestinal:  Negative for diarrhea and nausea.  Musculoskeletal:  Positive for myalgias.  Neurological:  Positive for headaches. Negative for dizziness.      Social History   Socioeconomic History   Marital status: Married    Spouse name: Not on file   Number of children: 2   Years of education: Not on file   Highest education level: Not on file  Occupational History   Occupation: Retired    Comment: Airline pilot  Tobacco Use   Smoking status: Never   Smokeless tobacco: Never   Tobacco comments:    NEVER USED TOBACCO  Vaping Use   Vaping Use: Never used  Substance and Sexual Activity   Alcohol use: Yes    Alcohol/week: 0.0 standard drinks    Comment:  1-2 wine / week   Drug use: No   Sexual activity: Not on file  Other Topics Concern   Not on file  Social  History Narrative   Exercise:  No regular exercise, active, yardwork   Social Determinants of Health   Financial Resource Strain: Not on file  Food Insecurity: Not on file  Transportation Needs: Not on file  Physical Activity: Not on file  Stress: Not on file  Social Connections: Not on file     Observations/Objective: Appears well in NAD Breathing normally.   Assessment and Plan:  See Problem List for Assessment and Plan of chronic medical problems.   Follow Up Instructions:    I discussed the assessment and treatment plan with the patient. The patient was provided an opportunity to ask questions and all were answered. The patient agreed with the plan and demonstrated an understanding of the instructions.   The patient was advised to call back or seek an in-person evaluation if the symptoms worsen or if the condition fails to improve as anticipated.    Binnie Rail, MD

## 2020-10-19 NOTE — Assessment & Plan Note (Signed)
Acute Today is day 4 of his symptoms Symptoms are mild-moderate and he is high risk with his lymphoma Start molnupiravir 800 mg bid otc medications for symptom relief Rest, fluids Call with questions/concerns

## 2020-10-29 DIAGNOSIS — Z86006 Personal history of melanoma in-situ: Secondary | ICD-10-CM | POA: Diagnosis not present

## 2020-10-29 DIAGNOSIS — L570 Actinic keratosis: Secondary | ICD-10-CM | POA: Diagnosis not present

## 2020-10-29 DIAGNOSIS — X32XXXD Exposure to sunlight, subsequent encounter: Secondary | ICD-10-CM | POA: Diagnosis not present

## 2020-10-29 DIAGNOSIS — Z08 Encounter for follow-up examination after completed treatment for malignant neoplasm: Secondary | ICD-10-CM | POA: Diagnosis not present

## 2020-10-29 DIAGNOSIS — Z1283 Encounter for screening for malignant neoplasm of skin: Secondary | ICD-10-CM | POA: Diagnosis not present

## 2020-10-30 ENCOUNTER — Inpatient Hospital Stay: Payer: PPO | Admitting: Hematology & Oncology

## 2020-10-30 ENCOUNTER — Inpatient Hospital Stay: Payer: PPO

## 2020-10-30 ENCOUNTER — Ambulatory Visit (HOSPITAL_BASED_OUTPATIENT_CLINIC_OR_DEPARTMENT_OTHER): Payer: PPO

## 2020-11-09 ENCOUNTER — Other Ambulatory Visit (HOSPITAL_BASED_OUTPATIENT_CLINIC_OR_DEPARTMENT_OTHER): Payer: PPO

## 2020-11-11 ENCOUNTER — Encounter: Payer: Self-pay | Admitting: Hematology & Oncology

## 2020-11-14 NOTE — Progress Notes (Signed)
Cardiology Office Note   Date:  11/17/2020   ID:  [name] [name], DOB 06/28/1950, MRN [id number]  PCP:  Binnie Rail, MD  Cardiologist:   Reyes Fifield Martinique, MD   No chief complaint on file.     History of Present Illness: [name] [name] is a 70 y.o. male who is seen at the request of Dr Quay Burow for evaluation of coronary artery calcification. He has a history of HLD and hypothyroidism. 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 Now on Imbruvica. Recent CT in February 2021 showed calcification in the left main coronary artery.    He retired from the [employer] in 2008. Prior to that he had yearly stress testing done. He has no history of DM, HTN, or tobacco use. Recently pravastatin was changed to Crestor for HLD. He does have a family history of CAD with father dying at age 24 with MI. He denies any symptoms of chest pain, SOB, palpitations.  Since I last saw him he was followed closely in our HTN clinic. He developed HTN while on Imbruvica for treatment of lymphoma. Was on  Valsartan and triamterene HCT. Developed edema on amlodipine. He then developed worsening renal function with creatinine up to 2.14 and potassium up to 6.6.  Renal US was normal. His BP medication was switched to hydralazine 100 mg tid. He has done well with this. Last creatinine 1.67. reports BP around 993 systolic. No chest pain. Had CT yesterday- stable from lymphoma standpoint. Aortic root measured 4 cm.   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 30 tablet 2   furosemide (LASIX) 40 MG tablet Take 40 mg by mouth daily.     hydrALAZINE (APRESOLINE) 100 MG tablet TAKE 1 TABLET BY MOUTH 3 TIMES DAILY. 270 tablet 0   Ibrutinib (IMBRUVICA) 420 MG TABS Take 420 mg by mouth daily. 28 tablet 11   levothyroxine (SYNTHROID) 150 MCG tablet TAKE 1 TABLET BY MOUTH EVERY DAY 90 tablet 1   Melatonin 10 MG SUBL Place 10 mg under the tongue as needed.     Multiple Vitamins-Minerals (CENTRUM SILVER 50+MEN PO) Take by mouth.     prochlorperazine (COMPAZINE) 10 MG tablet Take 1 tablet (10 mg total) by mouth every 6 (six) hours as needed for nausea or vomiting. 30 tablet 1   rosuvastatin (CRESTOR) 20 MG tablet TAKE 1 TABLET BY MOUTH EVERY DAY 30 tablet 0   venetoclax (VENCLEXTA) 100 MG tablet TAKE 2 TABLETS (200 MG TOTAL) BY MOUTH DAILY. TABLETS SHOULD BE SWALLOWED WHOLE WITH A MEAL AND A FULL GLASS OF WATER. 60 tablet 6   No current facility-administered medications for this visit.    Allergies:   Patient has no known allergies.  Social History:  The patient  reports that he has never smoked. He has never used smokeless tobacco. He reports current alcohol use. He reports that he does not use drugs.   Family History:  The patient's family history includes Alzheimer's disease in his mother; COPD in his brother; Coronary artery disease in his brother; Dementia in his brother; Diabetes in his brother; Heart attack (age of onset: 71) in his father; Heart disease in his paternal grandfather; Hypertension in his brother; Stroke in his brother.    ROS:  Please see the history of present illness.   Otherwise, review of systems are positive for none.   All other systems are reviewed and negative.    PHYSICAL EXAM: VS:  BP 140/76   Pulse 77   Resp 20   Ht 5'  11" (1.803 m)   Wt 210 lb (95.3 kg)   SpO2 97%   BMI 29.29 kg/m  , BMI Body mass index is 29.29 kg/m. GEN: Well nourished, well developed, in no acute distress  HEENT: normal  Neck: no JVD, carotid bruits, or masses Cardiac: RRR; no murmurs, rubs, or gallops,no edema  Respiratory:  clear to auscultation bilaterally, normal work of breathing GI: soft, nontender, nondistended, + BS MS: no deformity or atrophy  Skin: warm and dry, no rash Neuro:  Strength and sensation are intact Psych: euthymic mood, full affect   EKG:  EKG is ordered today. The ekg ordered today demonstrates NSR rate 77. Normal. I have personally reviewed and interpreted this study.    Recent Labs: 11/16/2020: ALT 18; BUN 27; Creatinine 2.04; Hemoglobin 11.7; Platelet Count 130; Potassium 3.7; Sodium 138    Lipid Panel    Component Value Date/Time   CHOL 140 08/07/2019 0857   CHOL 161 10/19/2015 0903   TRIG 108.0 08/07/2019 0857   TRIG 175 (H) 10/19/2015 0903   TRIG 106 02/22/2006 1133   HDL 47.80 08/07/2019 0857   HDL 40 10/19/2015 0903   CHOLHDL 3 08/07/2019 0857   VLDL 21.6 08/07/2019 0857   LDLCALC 71 08/07/2019 0857   LDLCALC 86 10/19/2015 0903      Wt Readings from Last 3 Encounters:  11/17/20 210 lb (95.3 kg)  11/16/20 209 lb 1.9 oz (94.9 kg)  07/29/20 208 lb (94.3 kg)      Other studies Reviewed: Additional studies/ records that were reviewed today include:   ETT 07/04/20: Study Highlights  Blood pressure demonstrated a hypertensive response to exercise. Clinically negative, electrically negative for ischemia   ASSESSMENT AND PLAN:  1.  Coronary calcification.  focal calcification of the left main. Otherwise no significant calcification. He is asymptomatic. Risk factors of hypercholesterolemia and family history of premature CAD. ETT was normal. Recommend risk factor modification.   2. Hypercholesterolemia. Recently switched from pravastatin to Crestor. Goal LDL < 70. Will check  lipid panel with next blood draw.   3. Marginal zone lymphoma recurrent. Per oncology  4. HTN- currently under fair control with hydralazine. If additional therapy needed he would probably tolerate low dose Coreg. Will monitor  5. CKD stage 3a  6. Thoracic aortic enlargement. With lymphoma this can be followed on serial CT scans.    Current medicines are reviewed at length with the patient today.  The patient does not have concerns regarding medicines.  The following changes have been made:  no change  Labs/ tests ordered today include:   Orders Placed This Encounter  Procedures   Lipid panel   EKG 12-Lead  Disposition:   FU 6 months  Signed, Satin Boal Martinique, MD  11/17/2020 5:14 PM    [facility] [address], [place], [place], [address] Phone [phone], Fax [phone]

## 2020-11-16 ENCOUNTER — Ambulatory Visit (HOSPITAL_BASED_OUTPATIENT_CLINIC_OR_DEPARTMENT_OTHER)
Admission: RE | Admit: 2020-11-16 | Discharge: 2020-11-16 | Disposition: A | Discharge status: Home / Self Care | Payer: PPO | Source: Ambulatory Visit | Attending: Hematology & Oncology | Admitting: Hematology & Oncology

## 2020-11-16 ENCOUNTER — Other Ambulatory Visit: Payer: Self-pay

## 2020-11-16 ENCOUNTER — Telehealth: Payer: Self-pay

## 2020-11-16 ENCOUNTER — Inpatient Hospital Stay: Payer: PPO | Attending: Hematology & Oncology | Admitting: Hematology & Oncology

## 2020-11-16 ENCOUNTER — Inpatient Hospital Stay: Payer: PPO

## 2020-11-16 ENCOUNTER — Encounter (HOSPITAL_BASED_OUTPATIENT_CLINIC_OR_DEPARTMENT_OTHER): Payer: Self-pay

## 2020-11-16 ENCOUNTER — Encounter: Payer: Self-pay | Admitting: Hematology & Oncology

## 2020-11-16 ENCOUNTER — Other Ambulatory Visit: Payer: PPO

## 2020-11-16 VITALS — BP 142/70 | HR 64 | Temp 98.2°F | Resp 18 | Wt 209.1 lb

## 2020-11-16 DIAGNOSIS — Z8616 Personal history of COVID-19: Secondary | ICD-10-CM | POA: Diagnosis not present

## 2020-11-16 DIAGNOSIS — C8300 Small cell B-cell lymphoma, unspecified site: Secondary | ICD-10-CM | POA: Diagnosis not present

## 2020-11-16 DIAGNOSIS — K5730 Diverticulosis of large intestine without perforation or abscess without bleeding: Secondary | ICD-10-CM | POA: Diagnosis not present

## 2020-11-16 DIAGNOSIS — C8583 Other specified types of non-Hodgkin lymphoma, intra-abdominal lymph nodes: Secondary | ICD-10-CM

## 2020-11-16 DIAGNOSIS — N289 Disorder of kidney and ureter, unspecified: Secondary | ICD-10-CM | POA: Diagnosis not present

## 2020-11-16 DIAGNOSIS — I700 Atherosclerosis of aorta: Secondary | ICD-10-CM | POA: Diagnosis not present

## 2020-11-16 DIAGNOSIS — R911 Solitary pulmonary nodule: Secondary | ICD-10-CM | POA: Diagnosis not present

## 2020-11-16 LAB — CBC WITH DIFFERENTIAL (CANCER CENTER ONLY)
Abs Immature Granulocytes: 0.03 10*3/uL (ref 0.00–0.07)
Basophils Absolute: 0 10*3/uL (ref 0.0–0.1)
Basophils Relative: 1 %
Eosinophils Absolute: 0 10*3/uL (ref 0.0–0.5)
Eosinophils Relative: 0 %
HCT: 35 % — ABNORMAL LOW (ref 39.0–52.0)
Hemoglobin: 11.7 g/dL — ABNORMAL LOW (ref 13.0–17.0)
Immature Granulocytes: 1 %
Lymphocytes Relative: 36 %
Lymphs Abs: 2 10*3/uL (ref 0.7–4.0)
MCH: 31.1 pg (ref 26.0–34.0)
MCHC: 33.4 g/dL (ref 30.0–36.0)
MCV: 93.1 fL (ref 80.0–100.0)
Monocytes Absolute: 0.9 10*3/uL (ref 0.1–1.0)
Monocytes Relative: 16 %
Neutro Abs: 2.6 10*3/uL (ref 1.7–7.7)
Neutrophils Relative %: 46 %
Platelet Count: 130 10*3/uL — ABNORMAL LOW (ref 150–400)
RBC: 3.76 MIL/uL — ABNORMAL LOW (ref 4.22–5.81)
RDW: 14.1 % (ref 11.5–15.5)
WBC Count: 5.5 10*3/uL (ref 4.0–10.5)
nRBC: 0 % (ref 0.0–0.2)

## 2020-11-16 LAB — CMP (CANCER CENTER ONLY)
ALT: 18 U/L (ref 0–44)
AST: 25 U/L (ref 15–41)
Albumin: 4.2 g/dL (ref 3.5–5.0)
Alkaline Phosphatase: 47 U/L (ref 38–126)
Anion gap: 10 (ref 5–15)
BUN: 27 mg/dL — ABNORMAL HIGH (ref 8–23)
CO2: 26 mmol/L (ref 22–32)
Calcium: 9.6 mg/dL (ref 8.9–10.3)
Chloride: 102 mmol/L (ref 98–111)
Creatinine: 2.04 mg/dL — ABNORMAL HIGH (ref 0.61–1.24)
GFR, Estimated: 35 mL/min — ABNORMAL LOW (ref >60)
Glucose, Bld: 79 mg/dL (ref 70–99)
Potassium: 3.7 mmol/L (ref 3.5–5.1)
Sodium: 138 mmol/L (ref 135–145)
Total Bilirubin: 0.8 mg/dL (ref 0.3–1.2)
Total Protein: 7.2 g/dL (ref 6.5–8.1)

## 2020-11-16 LAB — LACTATE DEHYDROGENASE: LDH: 191 U/L (ref 98–192)

## 2020-11-16 LAB — SAVE SMEAR(SSMR), FOR PROVIDER SLIDE REVIEW

## 2020-11-16 MED ORDER — IOHEXOL 350 MG/ML SOLN
100.0000 mL | Freq: Once | INTRAVENOUS | Status: AC | PRN
  Administered 2020-11-16: 68 mL via INTRAVENOUS

## 2020-11-16 NOTE — Telephone Encounter (Signed)
Appts made per 11/16/20 los and pt req to view on mychart and not print   Darianny Momon

## 2020-11-16 NOTE — Progress Notes (Signed)
Hematology and Oncology Follow Up Visit  [name] [name] [id number] June 12, 1950 70 y.o. 11/16/2020   Principle Diagnosis:  Marginal zone lymphoma-treated with R-CVP in February 2013 -- relapsed  Current Therapy:   Imbruvica 420 mg po q day - changed on 01/02/2020 Venetoclax 200 mg po q day -- start on 06/19/2020     Interim History:  [name] [name] is back for follow-up.  He is doing nicely right now.  We did go ahead do his CT scan today.  There is no lymphadenopathy.  There is no splenomegaly.  He had a 4 mm nodule in the left upper lobe.  This is indeterminate.  Of course, we will have to follow this up down the road.  He still has some renal insufficiency.  He is seeing the kidney doctors.  Of note, his IgM level keeps coming down nicely.  We last saw him, he was down to 856 mg/dL.  He has had no problems with bleeding.  There is no change in bowel or bladder habits.  He has had no cough or shortness of breath.  He has had a little bit of leg swelling.  He has had no fever.  He did have COVID again.  This was back in July.  He had a scratchy throat.  There is been no mouth sores.  Overall, his performance status is probably ECOG 1.     Medications:  Current Outpatient Medications:    cyclobenzaprine (FLEXERIL) 10 MG tablet, Take 1 tablet (10 mg total) by mouth 3 (three) times daily as needed for muscle spasms., Disp: 30 tablet, Rfl: 2   furosemide (LASIX) 40 MG tablet, Take 40 mg by mouth daily., Disp: , Rfl:    hydrALAZINE (APRESOLINE) 100 MG tablet, TAKE 1 TABLET BY MOUTH 3 TIMES DAILY., Disp: 270 tablet, Rfl: 0   Ibrutinib (IMBRUVICA) 420 MG TABS, Take 420 mg by mouth daily., Disp: 28 tablet, Rfl: 11   levothyroxine (SYNTHROID) 150 MCG tablet, TAKE 1 TABLET BY MOUTH EVERY DAY, Disp: 90 tablet, Rfl: 1   Melatonin 10 MG SUBL, Place 10 mg under the tongue as needed., Disp: , Rfl:    Multiple Vitamins-Minerals (CENTRUM SILVER 50+MEN PO), Take by mouth., Disp: , Rfl:     prochlorperazine (COMPAZINE) 10 MG tablet, Take 1 tablet (10 mg total) by mouth every 6 (six) hours as needed for nausea or vomiting., Disp: 30 tablet, Rfl: 1   rosuvastatin (CRESTOR) 20 MG tablet, TAKE 1 TABLET BY MOUTH EVERY DAY, Disp: 30 tablet, Rfl: 0   venetoclax (VENCLEXTA) 100 MG tablet, TAKE 2 TABLETS (200 MG TOTAL) BY MOUTH DAILY. TABLETS SHOULD BE SWALLOWED WHOLE WITH A MEAL AND A FULL GLASS OF WATER., Disp: 60 tablet, Rfl: 6  Allergies: No Known Allergies  Past Medical History, Surgical history, Social history, and Family History were reviewed and updated.  Review of Systems: Review of Systems  Constitutional: Negative.   HENT:  Negative.    Eyes: Negative.   Respiratory: Negative.    Cardiovascular: Negative.   Gastrointestinal:  Positive for abdominal pain and nausea.  Endocrine: Negative.   Genitourinary: Negative.    Musculoskeletal: Negative.   Skin: Negative.   Neurological: Negative.   Hematological: Negative.   Psychiatric/Behavioral: Negative.     Physical Exam:  vitals were not taken for this visit.   Wt Readings from Last 3 Encounters:  07/29/20 208 lb (94.3 kg)  06/17/20 215 lb (97.5 kg)  05/06/20 221 lb (100.2 kg)    Physical Exam  Vitals reviewed.  HENT:     Head: Normocephalic and atraumatic.  Eyes:     Pupils: Pupils are equal, round, and reactive to light.  Cardiovascular:     Rate and Rhythm: Normal rate and regular rhythm.     Heart sounds: Normal heart sounds.  Pulmonary:     Effort: Pulmonary effort is normal.     Breath sounds: Normal breath sounds.  Abdominal:     General: Bowel sounds are normal.     Palpations: Abdomen is soft.  Musculoskeletal:        General: No tenderness or deformity. Normal range of motion.     Cervical back: Normal range of motion.  Lymphadenopathy:     Cervical: No cervical adenopathy.  Skin:    General: Skin is warm and dry.     Findings: No erythema or rash.  Neurological:     Mental Status: He is  alert and oriented to person, place, and time.  Psychiatric:        Behavior: Behavior normal.        Thought Content: Thought content normal.        Judgment: Judgment normal.    Lab Results  Component Value Date   WBC 5.5 11/16/2020   HGB 11.7 (L) 11/16/2020   HCT 35.0 (L) 11/16/2020   MCV 93.1 11/16/2020   PLT 130 (L) 11/16/2020     Chemistry      Component Value Date/Time   NA 138 11/16/2020 0858   NA 138 12/06/2019 1501   NA 142 04/25/2016 0844   NA 140 10/19/2015 0903   K 3.7 11/16/2020 0858   K 4.1 04/25/2016 0844   K 4.1 10/19/2015 0903   CL 102 11/16/2020 0858   CL 102 04/25/2016 0844   CO2 26 11/16/2020 0858   CO2 29 04/25/2016 0844   CO2 25 10/19/2015 0903   BUN 27 (H) 11/16/2020 0858   BUN 20 12/06/2019 1501   BUN 14 04/25/2016 0844   BUN 21.6 10/19/2015 0903   CREATININE 2.04 (H) 11/16/2020 0858   CREATININE 1.1 04/25/2016 0844   CREATININE 1.3 10/19/2015 0903      Component Value Date/Time   CALCIUM 9.6 11/16/2020 0858   CALCIUM 9.6 04/25/2016 0844   CALCIUM 9.4 10/19/2015 0903   ALKPHOS 47 11/16/2020 0858   ALKPHOS 59 04/25/2016 0844   ALKPHOS 68 10/19/2015 0903   AST 25 11/16/2020 0858   AST 21 10/19/2015 0903   ALT 18 11/16/2020 0858   ALT 27 04/25/2016 0844   ALT 19 10/19/2015 0903   BILITOT 0.8 11/16/2020 0858   BILITOT 0.54 10/19/2015 0903      Impression and Plan: [name] [name] is a 70 year old white male.  We treated him 9 years ago.  He had a marginal zone lymphoma.  He went into clinical remission.  When he initially presented, he had splenomegaly.  When he relapsed, he again had splenomegaly.    This CT scan looks quite encouraging.  Again, I think that the venetoclax Imbruvica combination really are working well for him.  His blood counts look good.  His lymphocyte percentage is coming down.  I do not think that his treatments are in any way affecting his renal function.  I will not make any changes with his dosing of the Imbruvica  or the venetoclax.  I do not think we have to do another scan on him probably for 6 months.  Again we will have to watch this left upper  lobe nodule.  I will plan to see him back in about 3 months.  We will get him back between Thanksgiving and Christmas.    [name], MD 8/29/202211:08 AM

## 2020-11-17 ENCOUNTER — Ambulatory Visit: Payer: PPO | Admitting: Cardiology

## 2020-11-17 ENCOUNTER — Encounter: Payer: Self-pay | Admitting: Cardiology

## 2020-11-17 VITALS — BP 140/76 | HR 77 | Resp 20 | Ht 71.0 in | Wt 210.0 lb

## 2020-11-17 DIAGNOSIS — E7849 Other hyperlipidemia: Secondary | ICD-10-CM

## 2020-11-17 DIAGNOSIS — I159 Secondary hypertension, unspecified: Secondary | ICD-10-CM

## 2020-11-17 LAB — IGG, IGA, IGM
IgA: 154 mg/dL (ref 61–437)
IgG (Immunoglobin G), Serum: 614 mg/dL (ref 603–1613)
IgM (Immunoglobulin M), Srm: 789 mg/dL — ABNORMAL HIGH (ref 20–172)

## 2020-11-18 ENCOUNTER — Telehealth: Payer: Self-pay

## 2020-11-18 NOTE — Telephone Encounter (Signed)
Called and informed patient of lab results, patient verbalized understanding and denies any questions or concerns at this time.   

## 2020-11-18 NOTE — Telephone Encounter (Signed)
-----   Message from [name] Napoleon, MD sent at 11/17/2020  5:34 PM EDT ----- Call - the IgM level keeps coming down!!!  This is excellent!!!  [name] [name]

## 2020-11-19 LAB — PROTEIN ELECTROPHORESIS, SERUM, WITH REFLEX
A/G Ratio: 1.3 (ref 0.7–1.7)
Albumin ELP: 4 g/dL (ref 2.9–4.4)
Alpha-1-Globulin: 0.2 g/dL (ref 0.0–0.4)
Alpha-2-Globulin: 0.8 g/dL (ref 0.4–1.0)
Beta Globulin: 0.9 g/dL (ref 0.7–1.3)
Gamma Globulin: 1.1 g/dL (ref 0.4–1.8)
Globulin, Total: 3 g/dL (ref 2.2–3.9)
M-Spike, %: 0.6 g/dL — ABNORMAL HIGH
SPEP Interpretation: 0
Total Protein ELP: 7 g/dL (ref 6.0–8.5)

## 2020-11-19 LAB — IMMUNOFIXATION REFLEX, SERUM
IgA: 154 mg/dL (ref 61–437)
IgG (Immunoglobin G), Serum: 615 mg/dL (ref 603–1613)
IgM (Immunoglobulin M), Srm: 811 mg/dL — ABNORMAL HIGH (ref 20–172)

## 2020-11-20 ENCOUNTER — Other Ambulatory Visit: Payer: Self-pay | Admitting: Internal Medicine

## 2020-11-20 DIAGNOSIS — N1832 Chronic kidney disease, stage 3b: Secondary | ICD-10-CM | POA: Diagnosis not present

## 2020-11-24 ENCOUNTER — Other Ambulatory Visit: Payer: Self-pay

## 2020-11-24 DIAGNOSIS — C8583 Other specified types of non-Hodgkin lymphoma, intra-abdominal lymph nodes: Secondary | ICD-10-CM

## 2020-11-24 MED ORDER — IMBRUVICA 420 MG PO TABS: 420.0000 mg | ORAL_TABLET | Freq: Every day | ORAL | 11 refills | Status: DC

## 2020-11-30 DIAGNOSIS — N1832 Chronic kidney disease, stage 3b: Secondary | ICD-10-CM | POA: Diagnosis not present

## 2020-12-04 ENCOUNTER — Other Ambulatory Visit: Payer: Self-pay | Admitting: *Deleted

## 2020-12-04 ENCOUNTER — Encounter: Payer: Self-pay | Admitting: Hematology & Oncology

## 2020-12-04 ENCOUNTER — Ambulatory Visit (INDEPENDENT_AMBULATORY_CARE_PROVIDER_SITE_OTHER): Payer: PPO

## 2020-12-04 DIAGNOSIS — Z0000 Encounter for general adult medical examination without abnormal findings: Secondary | ICD-10-CM

## 2020-12-04 NOTE — Telephone Encounter (Signed)
Refill request for Venclexta faxed to [facility] at [phone].

## 2020-12-04 NOTE — Patient Instructions (Signed)
[name] , Thank you for taking time to come for your Medicare Wellness Visit. I appreciate your ongoing commitment to your health goals. Please review the following plan we discussed and let me know if I can assist you in the future.   Screening recommendations/referrals: Colonoscopy: 11/22/2017; due every 5 years (due 11/23/2022) Recommended yearly ophthalmology/optometry visit for glaucoma screening and checkup Recommended yearly dental visit for hygiene and checkup  Vaccinations: Influenza vaccine: 11/24/2020 Pneumococcal vaccine: 11/28/2014, 04/19/2019 Tdap vaccine: 08/28/2010; due every 10 years; TDAP: not covered by Medicare as preventative but will cover as treatment for an injury. Shingles vaccine: 12/21/2016, 05/19/2017   Covid-19: 05/11/2019, 06/04/2019, 12/28/2019, 08/05/2020  Advanced directives: Advance directive discussed with you today. Even though you declined this today please call our office should you change your mind and we can give you the proper paperwork for you to fill out.  Conditions/risks identified: Yes; Client understands the importance of follow-up with providers by attending scheduled visits and discussed goals to eat healthier, increase physical activity, exercise the brain, socialize more, get enough sleep and make time for laughter.  Next appointment: Please schedule your next Medicare Wellness Visit with your Nurse Health Advisor in 1 year by calling [phone].  Preventive Care 68 Years and Older, Male Preventive care refers to lifestyle choices and visits with your health care provider that can promote health and wellness. What does preventive care include? A yearly physical exam. This is also called an annual well check. Dental exams once or twice a year. Routine eye exams. Ask your health care provider how often you should have your eyes checked. Personal lifestyle choices, including: Daily care of your teeth and gums. Regular physical activity. Eating a healthy  diet. Avoiding tobacco and drug use. Limiting alcohol use. Practicing safe sex. Taking low doses of aspirin every day. Taking vitamin and mineral supplements as recommended by your health care provider. What happens during an annual well check? The services and screenings done by your health care provider during your annual well check will depend on your age, overall health, lifestyle risk factors, and family history of disease. Counseling  Your health care provider may ask you questions about your: Alcohol use. Tobacco use. Drug use. Emotional well-being. Home and relationship well-being. Sexual activity. Eating habits. History of falls. Memory and ability to understand (cognition). Work and work Statistician. Screening  You may have the following tests or measurements: Height, weight, and BMI. Blood pressure. Lipid and cholesterol levels. These may be checked every 5 years, or more frequently if you are over 68 years old. Skin check. Lung cancer screening. You may have this screening every year starting at age 68 if you have a 30-pack-year history of smoking and currently smoke or have quit within the past 15 years. Fecal occult blood test (FOBT) of the stool. You may have this test every year starting at age 68. Flexible sigmoidoscopy or colonoscopy. You may have a sigmoidoscopy every 5 years or a colonoscopy every 10 years starting at age 68. Prostate cancer screening. Recommendations will vary depending on your family history and other risks. Hepatitis C blood test. Hepatitis B blood test. Sexually transmitted disease (STD) testing. Diabetes screening. This is done by checking your blood sugar (glucose) after you have not eaten for a while (fasting). You may have this done every 1-3 years. Abdominal aortic aneurysm (AAA) screening. You may need this if you are a current or former smoker. Osteoporosis. You may be screened starting at age 68 if you  are at high risk. Talk with  your health care provider about your test results, treatment options, and if necessary, the need for more tests. Vaccines  Your health care provider may recommend certain vaccines, such as: Influenza vaccine. This is recommended every year. Tetanus, diphtheria, and acellular pertussis (Tdap, Td) vaccine. You may need a Td booster every 10 years. Zoster vaccine. You may need this after age 68. Pneumococcal 13-valent conjugate (PCV13) vaccine. One dose is recommended after age 68. Pneumococcal polysaccharide (PPSV23) vaccine. One dose is recommended after age 68. Talk to your health care provider about which screenings and vaccines you need and how often you need them. This information is not intended to replace advice given to you by your health care provider. Make sure you discuss any questions you have with your health care provider. Document Released: 04/03/2015 Document Revised: 11/25/2015 Document Reviewed: 01/06/2015 Elsevier Interactive Patient Education  2017 [place] Prevention in the Home Falls can cause injuries. They can happen to people of all ages. There are many things you can do to make your home safe and to help prevent falls. What can I do on the outside of my home? Regularly fix the edges of walkways and driveways and fix any cracks. Remove anything that might make you trip as you walk through a door, such as a raised step or threshold. Trim any bushes or trees on the path to your home. Use bright outdoor lighting. Clear any walking paths of anything that might make someone trip, such as rocks or tools. Regularly check to see if handrails are loose or broken. Make sure that both sides of any steps have handrails. Any raised decks and porches should have guardrails on the edges. Have any leaves, snow, or ice cleared regularly. Use sand or salt on walking paths during winter. Clean up any spills in your garage right away. This includes oil or grease spills. What  can I do in the bathroom? Use night lights. Install grab bars by the toilet and in the tub and shower. Do not use towel bars as grab bars. Use non-skid mats or decals in the tub or shower. If you need to sit down in the shower, use a plastic, non-slip stool. Keep the floor dry. Clean up any water that spills on the floor as soon as it happens. Remove soap buildup in the tub or shower regularly. Attach bath mats securely with double-sided non-slip rug tape. Do not have throw rugs and other things on the floor that can make you trip. What can I do in the bedroom? Use night lights. Make sure that you have a light by your bed that is easy to reach. Do not use any sheets or blankets that are too big for your bed. They should not hang down onto the floor. Have a firm chair that has side arms. You can use this for support while you get dressed. Do not have throw rugs and other things on the floor that can make you trip. What can I do in the kitchen? Clean up any spills right away. Avoid walking on wet floors. Keep items that you use a lot in easy-to-reach places. If you need to reach something above you, use a strong step stool that has a grab bar. Keep electrical cords out of the way. Do not use floor polish or wax that makes floors slippery. If you must use wax, use non-skid floor wax. Do not have throw rugs and other things on the  floor that can make you trip. What can I do with my stairs? Do not leave any items on the stairs. Make sure that there are handrails on both sides of the stairs and use them. Fix handrails that are broken or loose. Make sure that handrails are as long as the stairways. Check any carpeting to make sure that it is firmly attached to the stairs. Fix any carpet that is loose or worn. Avoid having throw rugs at the top or bottom of the stairs. If you do have throw rugs, attach them to the floor with carpet tape. Make sure that you have a light switch at the top of the  stairs and the bottom of the stairs. If you do not have them, ask someone to add them for you. What else can I do to help prevent falls? Wear shoes that: Do not have high heels. Have rubber bottoms. Are comfortable and fit you well. Are closed at the toe. Do not wear sandals. If you use a stepladder: Make sure that it is fully opened. Do not climb a closed stepladder. Make sure that both sides of the stepladder are locked into place. Ask someone to hold it for you, if possible. Clearly mark and make sure that you can see: Any grab bars or handrails. First and last steps. Where the edge of each step is. Use tools that help you move around (mobility aids) if they are needed. These include: Canes. Walkers. Scooters. Crutches. Turn on the lights when you go into a dark area. Replace any light bulbs as soon as they burn out. Set up your furniture so you have a clear path. Avoid moving your furniture around. If any of your floors are uneven, fix them. If there are any pets around you, be aware of where they are. Review your medicines with your doctor. Some medicines can make you feel dizzy. This can increase your chance of falling. Ask your doctor what other things that you can do to help prevent falls. This information is not intended to replace advice given to you by your health care provider. Make sure you discuss any questions you have with your health care provider. Document Released: 01/01/2009 Document Revised: 08/13/2015 Document Reviewed: 04/11/2014 Elsevier Interactive Patient Education  2017 [employer].

## 2020-12-04 NOTE — Progress Notes (Signed)
I connected with [name] today by telephone and verified that I am speaking with the correct person using two identifiers. Location patient: home Location provider: work Persons participating in the virtual visit: patient, provider.   I discussed the limitations, risks, security and privacy concerns of performing an evaluation and management service by telephone and the availability of in person appointments. I also discussed with the patient that there may be a patient responsible charge related to this service. The patient expressed understanding and verbally consented to this telephonic visit.    Interactive audio and video telecommunications were attempted between this provider and patient, however failed, due to patient having technical difficulties OR patient did not have access to video capability.  We continued and completed visit with audio only.  Some vital signs may be absent or patient reported.   Time Spent with patient on telephone encounter: 40 minutes  Subjective:   [name] [name] is a 70 y.o. male who presents for Medicare Annual/Subsequent preventive examination.  Review of Systems     Cardiac Risk Factors include: advanced age (>88men, >74 women);family history of premature cardiovascular disease;hypertension;dyslipidemia;male gender     Objective:    Today's Vitals   12/04/20 1401  PainSc: 0-No pain   There is no height or weight on file to calculate BMI.  Advanced Directives 12/04/2020 11/16/2020 07/29/2020 06/17/2020 05/06/2020 04/17/2020 02/27/2020  Does Patient Have a Medical Advance Directive? No No No No No No No  Would patient like information on creating a medical advance directive? No - Patient declined No - Patient declined No - Patient declined No - Patient declined No - Patient declined No - Patient declined No - Patient declined    Current Medications (verified) Outpatient Encounter Medications as of 12/04/2020  Medication Sig    cyclobenzaprine (FLEXERIL) 10 MG tablet Take 1 tablet (10 mg total) by mouth 3 (three) times daily as needed for muscle spasms.   furosemide (LASIX) 40 MG tablet Take 40 mg by mouth daily.   hydrALAZINE (APRESOLINE) 100 MG tablet TAKE 1 TABLET BY MOUTH 3 TIMES DAILY.   ibrutinib (IMBRUVICA) 420 MG TABS Take 420 mg by mouth daily.   levothyroxine (SYNTHROID) 150 MCG tablet TAKE 1 TABLET BY MOUTH EVERY DAY   Melatonin 10 MG SUBL Place 10 mg under the tongue as needed.   Multiple Vitamins-Minerals (CENTRUM SILVER 50+MEN PO) Take by mouth.   prochlorperazine (COMPAZINE) 10 MG tablet Take 1 tablet (10 mg total) by mouth every 6 (six) hours as needed for nausea or vomiting.   rosuvastatin (CRESTOR) 20 MG tablet TAKE 1 TABLET BY MOUTH EVERY DAY   venetoclax (VENCLEXTA) 100 MG tablet TAKE 2 TABLETS (200 MG TOTAL) BY MOUTH DAILY. TABLETS SHOULD BE SWALLOWED WHOLE WITH A MEAL AND A FULL GLASS OF WATER.   No facility-administered encounter medications on file as of 12/04/2020.    Allergies (verified) Patient has no known allergies.   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   Thyroid disease 2007   hypothyroidism   Past Surgical History:  Procedure Laterality Date   COLONOSCOPY  2008   Dr Fuller Plan   COLONOSCOPY W/ POLYPECTOMY  2014   Dr Fuller Plan   POLYPECTOMY     [facility] PLACEMENT  Dec 2012   REMOVED spring 2014   TONSILLECTOMY     Family History  Problem Relation Age of Onset   Alzheimer's disease Mother    Heart attack Father 77   Diabetes Brother    COPD Brother    Dementia Brother    Stroke Brother    Hypertension Brother         X 3   Heart disease Paternal Grandfather        in 13s   Coronary artery disease Brother        CBAG/vavlve replacement   Cancer Neg Hx    Colon cancer Neg Hx    Colon polyps Neg Hx    Rectal  cancer Neg Hx    Stomach cancer Neg Hx    Esophageal cancer Neg Hx    Social History   Socioeconomic History   Marital status: Married    Spouse name: Not on file   Number of children: 2   Years of education: Not on file   Highest education level: Not on file  Occupational History   Occupation: Retired    Comment: Airline pilot  Tobacco Use   Smoking status: Never   Smokeless tobacco: Never   Tobacco comments:    NEVER USED TOBACCO  Vaping Use   Vaping Use: Never used  Substance and Sexual Activity   Alcohol use: Yes    Alcohol/week: 0.0 standard drinks    Comment:  1-2 wine / week   Drug use: No   Sexual activity: Not on file  Other Topics Concern   Not on file  Social History Narrative   Exercise:  No regular exercise, active, yardwork   Social Determinants of Health   Financial Resource Strain: Low Risk    Difficulty of Paying Living Expenses: Not hard at all  Food Insecurity: No Food Insecurity   Worried About Charity fundraiser in the Last Year: Never true   [place] in the Last Year: Never true  Transportation Needs: No Transportation Needs   Lack of Transportation (Medical): No   Lack of Transportation (Non-Medical): No  Physical Activity: Inactive   Days of Exercise per Week: 0 days   Minutes of Exercise per Session: 0 min  Stress: No Stress Concern Present   Feeling of Stress : Not at all  Social Connections: Socially Integrated   Frequency of Communication with Friends and Family: More than three times a week   Frequency of Social Gatherings with Friends and Family: More than three times a week   Attends Religious Services: 1 to 4 times per year   Active Member of [employer] or Organizations: Yes   Attends Archivist Meetings: 1 to 4 times per year   Marital Status: Married    Tobacco Counseling Counseling given: Not Answered Tobacco comments: NEVER USED TOBACCO   Clinical Intake:  Pre-visit preparation completed: Yes  Pain :  No/denies pain Pain Score: 0-No pain     Nutritional Risks: None Diabetes: No  How often do you need to have someone help you when you read instructions, pamphlets, or other written materials from your doctor or pharmacy?: 1 - Never What is the last grade level you completed in school?: 1 Year of college  Diabetic? no  Interpreter Needed?: No  Information entered by :: Lisette Abu, LPN   Activities of Daily Living In your present state of health, do you have any difficulty performing the following activities: 12/04/2020  Hearing? N  Vision? N  Difficulty concentrating or making decisions? N  Walking or climbing stairs? N  Dressing  or bathing? N  Doing errands, shopping? N  Preparing Food and eating ? N  Using the Toilet? N  In the past six months, have you accidently leaked urine? N  Do you have problems with loss of bowel control? N  Managing your Medications? N  Managing your Finances? N  Housekeeping or managing your Housekeeping? N  Some recent data might be hidden    Patient Care Team: Binnie Rail, MD as PCP - General (Internal Medicine) Marin Olp Rudell Cobb, MD as Consulting Physician (Oncology)  Indicate any recent Medical Services you may have received from other than [facility] providers in the past year (date may be approximate).     Assessment:   This is a routine wellness examination for [name] [name].  Hearing/Vision screen Hearing Screening - Comments:: Patient denied any hearing difficulty. Vision Screening - Comments:: Patient had cataracts removed; vision now 20/20.  Annual exam done by Dr. Warden Fillers.  Dietary issues and exercise activities discussed: Current Exercise Habits: The patient does not participate in regular exercise at present, Exercise limited by: None identified   Goals Addressed   None   Depression Screen PHQ 2/9 Scores 12/04/2020 09/24/2019 04/19/2019 04/11/2018 03/20/2017 02/25/2011  PHQ - 2 Score 0 0 0 0 0 0    Fall Risk Fall  Risk  12/04/2020 09/24/2019 04/19/2019 04/11/2018 03/20/2017  Falls in the past year? 0 0 0 1 Yes  Number falls in past yr: 0 0 0 0 1  Injury with Fall? 0 0 - 0 No  Risk for fall due to : No Fall Risks No Fall Risks - - History of fall(s)  Follow up - Falls evaluation completed - - Falls prevention discussed    FALL RISK PREVENTION PERTAINING TO THE HOME:  Any stairs in or around the home? Yes  If so, are there any without handrails? No  Home free of loose throw rugs in walkways, pet beds, electrical cords, etc? Yes  Adequate lighting in your home to reduce risk of falls? Yes   ASSISTIVE DEVICES UTILIZED TO PREVENT FALLS:  Life alert? No  Use of a cane, walker or w/c? No  Grab bars in the bathroom? No  Shower chair or bench in shower? No  Elevated toilet seat or a handicapped toilet? Yes   TIMED UP AND GO:  Was the test performed? No .  Length of time to ambulate 10 feet: n/a sec.   Gait steady and fast without use of assistive device  Cognitive Function: Normal cognitive status assessed by direct observation by this Nurse Health Advisor. No abnormalities found.  No flowsheet data found.         Immunizations Immunization History  Administered Date(s) Administered   Fluad Quad(high Dose 65+) 12/02/2018   Influenza Inj Mdck Quad Pf 12/21/2016   Influenza Whole 01/05/2010   Influenza, High Dose Seasonal PF 12/02/2018, 11/15/2019, 11/24/2020   Influenza,inj,Quad PF,6+ Mos 12/19/2012   Influenza-Unspecified 11/25/2013, 12/17/2014, 12/10/2015, 12/21/2016, 11/07/2017, 12/02/2018, 11/15/2019   PFIZER Comirnaty(Gray Top)Covid-19 Tri-Sucrose Vaccine 08/05/2020   PFIZER(Purple Top)SARS-COV-2 Vaccination 05/11/2019, 06/04/2019, 12/28/2019, 08/05/2020   Pneumococcal Conjugate-13 11/28/2014   Pneumococcal Polysaccharide-23 12/16/2013, 04/19/2019   Tdap 09/07/2010   Zoster Recombinat (Shingrix) 12/21/2016, 05/19/2017    TDAP status: Due, Education has been provided regarding the  importance of this vaccine. Advised may receive this vaccine at local pharmacy or Health Dept. Aware to provide a copy of the vaccination record if obtained from local pharmacy or Health Dept. Verbalized acceptance and understanding.  Flu Vaccine  status: Up to date  Pneumococcal vaccine status: Up to date  Covid-19 vaccine status: Completed vaccines  Qualifies for Shingles Vaccine? Yes   Zostavax completed Yes   Shingrix Completed?: Yes  Screening Tests Health Maintenance  Topic Date Due   TETANUS/TDAP  09/06/2020   COVID-19 Vaccine (5 - Booster for Pfizer series) 12/06/2020   COLONOSCOPY (Pts 45-18yrs Insurance coverage will need to be confirmed)  11/23/2022   INFLUENZA VACCINE  Completed   Hepatitis C Screening  Completed   Zoster Vaccines- Shingrix  Completed   HPV VACCINES  Aged Out    Health Maintenance  Health Maintenance Due  Topic Date Due   TETANUS/TDAP  09/06/2020   COVID-19 Vaccine (5 - Booster for Pfizer series) 12/06/2020    Colorectal cancer screening: Type of screening: Colonoscopy. Completed 11/22/2017. Repeat every 5 years  Lung Cancer Screening: (Low Dose CT Chest recommended if Age 18-80 years, 30 pack-year currently smoking OR have quit w/in 15years.) does not qualify.   Lung Cancer Screening Referral: no  Additional Screening:  Hepatitis C Screening: does qualify; Completed yes  Vision Screening: Recommended annual ophthalmology exams for early detection of glaucoma and other disorders of the eye. Is the patient up to date with their annual eye exam?  Yes  Who is the provider or what is the name of the office in which the patient attends annual eye exams? Warden Fillers, MD. If pt is not established with a provider, would they like to be referred to a provider to establish care? No .   Dental Screening: Recommended annual dental exams for proper oral hygiene  Community Resource Referral / Chronic Care Management: CRR required this visit?  No    CCM required this visit?  No      Plan:     I have personally reviewed and noted the following in the patient's chart:   Medical and social history Use of alcohol, tobacco or illicit drugs  Current medications and supplements including opioid prescriptions. Patient is not currently taking opioid prescriptions. Functional ability and status Nutritional status Physical activity Advanced directives List of other physicians Hospitalizations, surgeries, and ER visits in previous 12 months Vitals Screenings to include cognitive, depression, and falls Referrals and appointments  In addition, I have reviewed and discussed with patient certain preventive protocols, quality metrics, and best practice recommendations. A written personalized care plan for preventive services as well as general preventive health recommendations were provided to patient.     Sheral Flow, LPN   04/20/8655   Nurse Notes:  Patient is cogitatively intact. There were no vitals filed for this visit. There is no height or weight on file to calculate BMI. Patient stated that he has no issues with gait or balance; does not use any assistive devices. Hearing Screening - Comments:: Patient denied any hearing difficulty. Vision Screening - Comments:: Patient had cataracts removed; vision now 20/20.  Annual exam done by Dr. Warden Fillers.

## 2020-12-08 ENCOUNTER — Other Ambulatory Visit: Payer: Self-pay | Admitting: *Deleted

## 2020-12-08 MED ORDER — VENETOCLAX 100 MG PO TABS: ORAL_TABLET | ORAL | 6 refills | Status: DC

## 2020-12-10 DIAGNOSIS — N1832 Chronic kidney disease, stage 3b: Secondary | ICD-10-CM | POA: Diagnosis not present

## 2020-12-10 DIAGNOSIS — I129 Hypertensive chronic kidney disease with stage 1 through stage 4 chronic kidney disease, or unspecified chronic kidney disease: Secondary | ICD-10-CM | POA: Diagnosis not present

## 2020-12-10 DIAGNOSIS — E875 Hyperkalemia: Secondary | ICD-10-CM | POA: Diagnosis not present

## 2020-12-10 DIAGNOSIS — R809 Proteinuria, unspecified: Secondary | ICD-10-CM | POA: Diagnosis not present

## 2020-12-10 DIAGNOSIS — C8580 Other specified types of non-Hodgkin lymphoma, unspecified site: Secondary | ICD-10-CM | POA: Diagnosis not present

## 2020-12-19 ENCOUNTER — Other Ambulatory Visit: Payer: Self-pay | Admitting: Internal Medicine

## 2021-01-13 ENCOUNTER — Other Ambulatory Visit: Payer: Self-pay | Admitting: Cardiology

## 2021-02-01 ENCOUNTER — Encounter: Payer: Self-pay | Admitting: Hematology & Oncology

## 2021-02-02 IMAGING — CT CT CHEST-ABD-PELV W/ CM
2 of 5 series · 13 of 36 positions shown, 15 images · IV contrast (Omnipaque)
Comparison: CT scan 10/11/2019

CLINICAL DATA: Restaging marginal zone lymphoma.

EXAM:
CT CHEST, ABDOMEN, AND PELVIS WITH CONTRAST
TECHNIQUE: Multidetector CT imaging of the chest, abdomen and pelvis was
performed following the standard protocol during bolus
administration of intravenous contrast.
CONTRAST:  100mL OMNIPAQUE IOHEXOL 300 MG/ML  SOLN

[Series 2: cap with 2 · axial · 0.98mm/px · z∈[-664,-69]mm · 10 of 147 slices shown, 12 images]
[im 14/147  mediastinal]
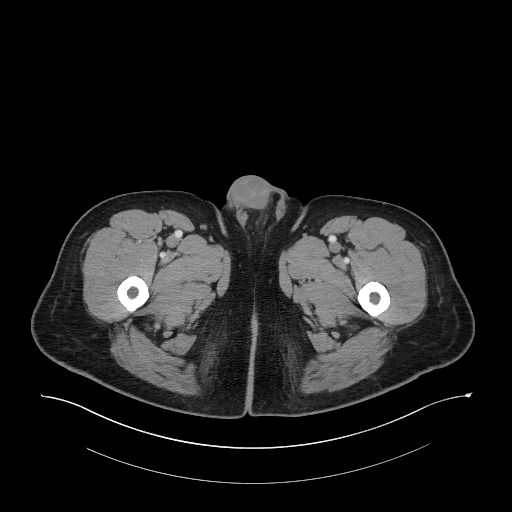
[im 14/147  bone]
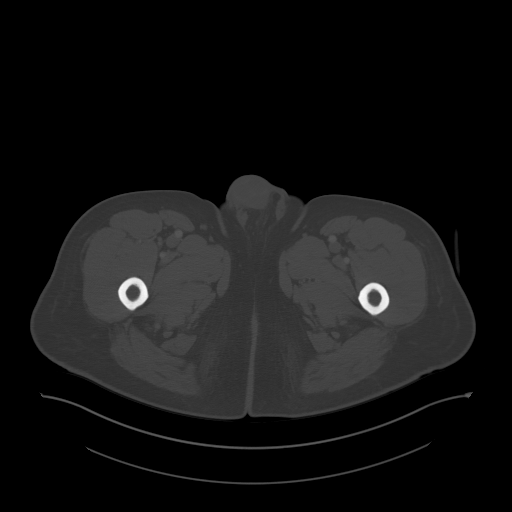
[im 27/147  mediastinal]
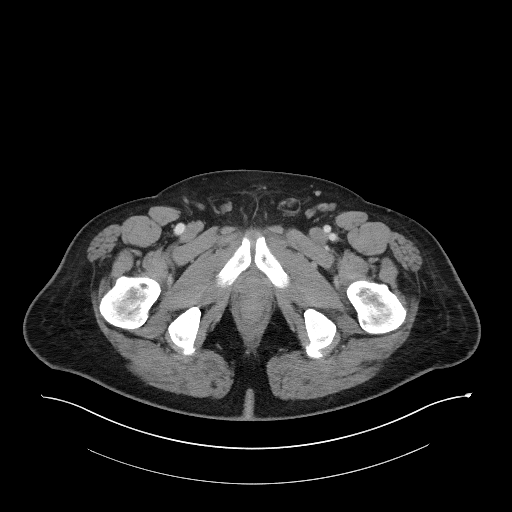
[im 40/147  mediastinal]
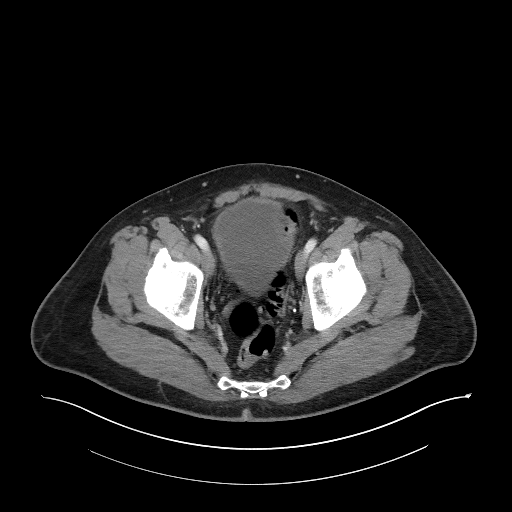
[im 54/147  mediastinal]
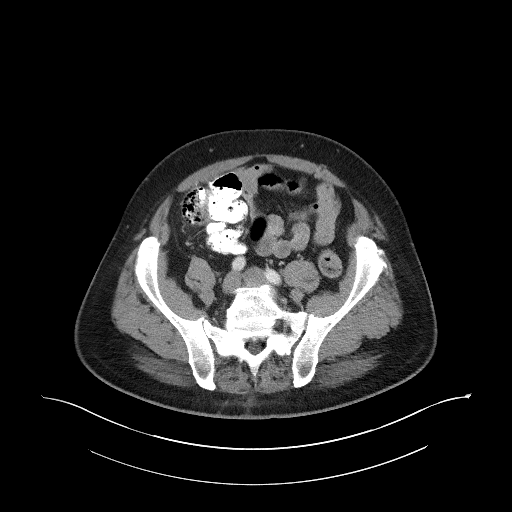
[im 67/147  mediastinal]
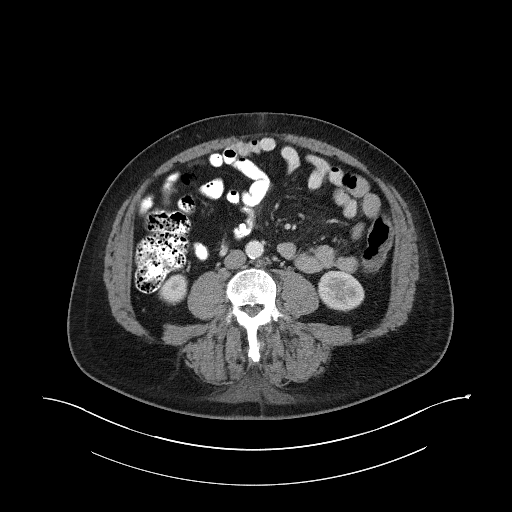
[im 80/147  mediastinal]
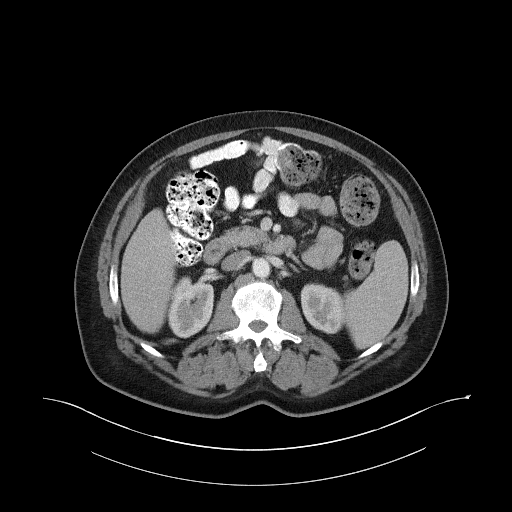
[im 93/147  mediastinal]
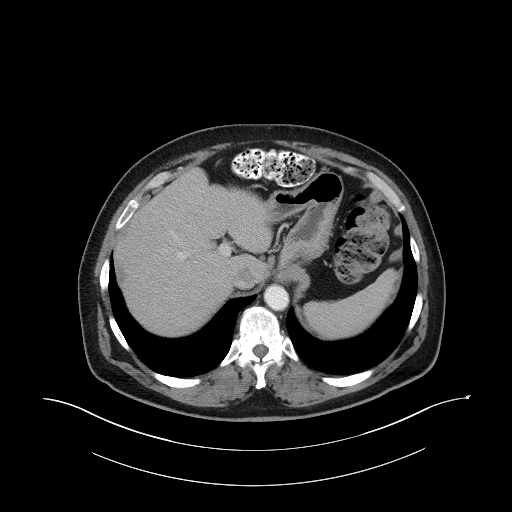
[im 107/147  mediastinal]
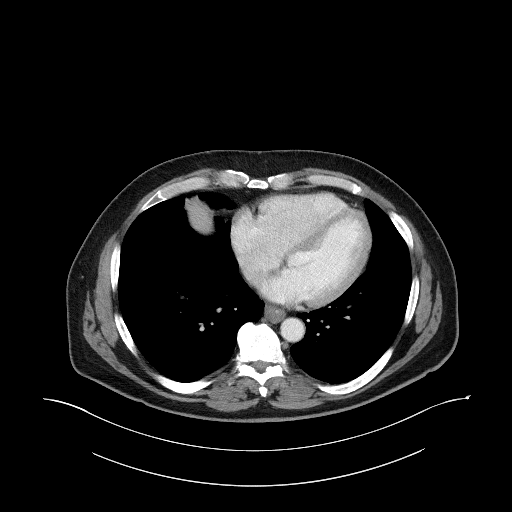
[im 120/147  mediastinal]
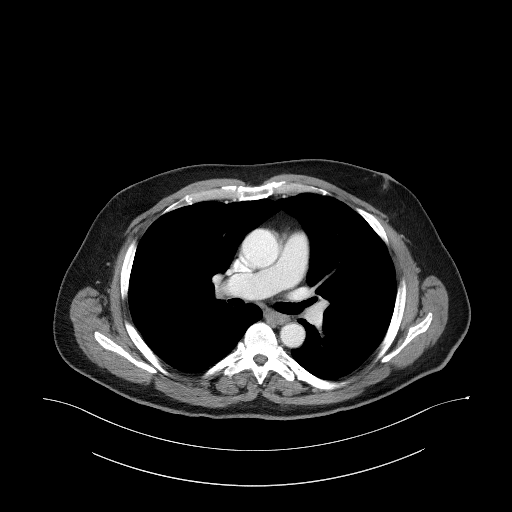
[im 120/147  bone]
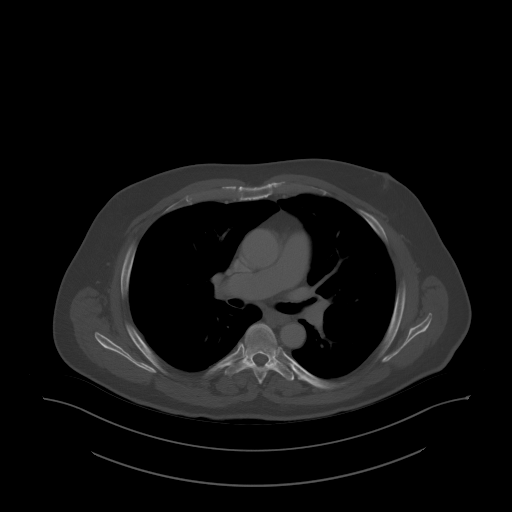
[im 133/147  mediastinal]
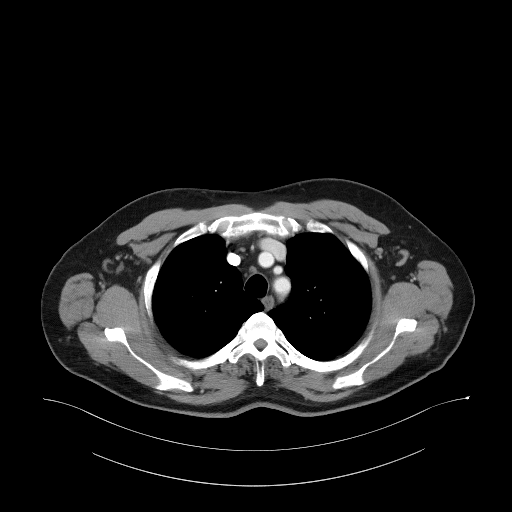

[Series 5: coronals · coronal · 0.84mm/px · 3 of 156 slices shown]
[im 32/156  mediastinal]
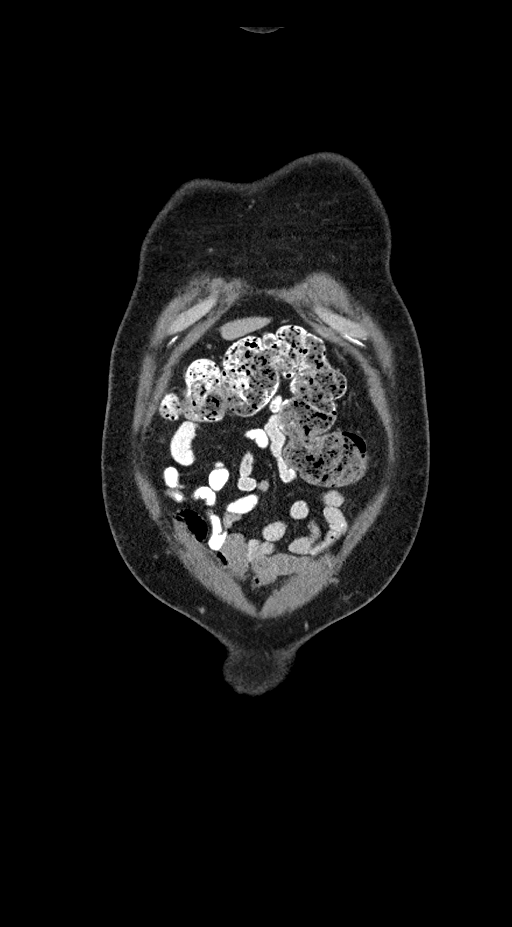
[im 63/156  mediastinal]
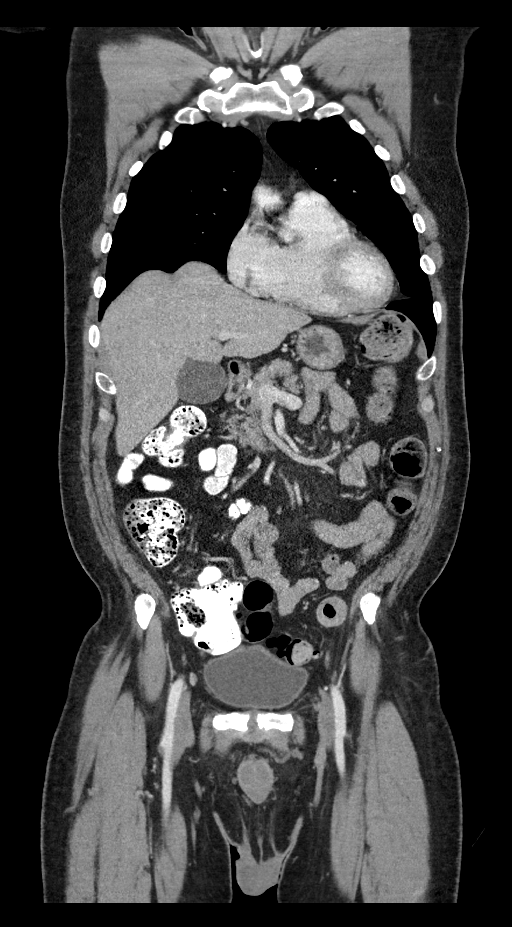
[im 94/156  mediastinal]
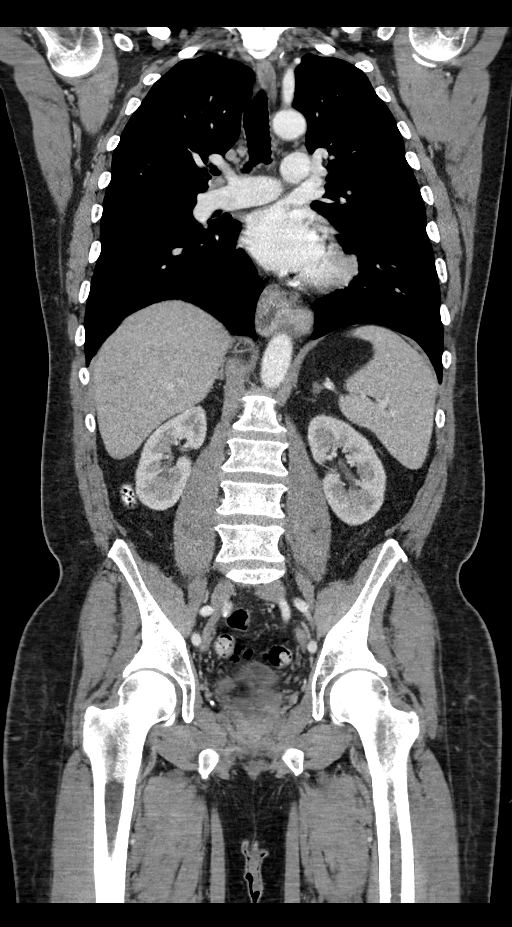

[13 of 36 positions shown; findings below may reference images not displayed]

FINDINGS: CT CHEST FINDINGS

Cardiovascular: The heart is normal in size. No pericardial
effusion. The aorta is normal in caliber. No dissection. No
atherosclerotic calcification. Scattered coronary artery
calcifications are stable.

Mediastinum/Nodes: Small scattered mediastinal and hilar lymph nodes
are stable. These all measure less than 8 mm. There are also
scattered bilateral axillary lymph nodes which are stable. No new or
progressive findings.

Stable moderate-sized hiatal hernia.

Lungs/Pleura: No worrisome pulmonary lesions or pulmonary nodules.
Mild stable emphysematous changes. No pleural effusions or pleural
nodules. Minimal basilar scarring changes.

Musculoskeletal: No chest wall mass. The bony thorax is intact.

CT ABDOMEN PELVIS FINDINGS

Hepatobiliary: No hepatic lesions or intrahepatic biliary
dilatation. The gallbladder is normal. No common bile duct
dilatation.

Pancreas: No mass, inflammation or ductal dilatation.

Spleen: Normal size. No focal lesions.

Adrenals/Urinary Tract: The adrenal glands and kidneys are
unremarkable. No renal lesions, renal calculi or hydronephrosis. The
bladder is unremarkable.

Stomach/Bowel: The stomach, duodenum, small bowel and colon are
unremarkable. No acute inflammatory changes, mass lesions or
obstructive findings. The terminal ileum is normal. The appendix is
normal. There is advanced sigmoid colon diverticulosis but no
findings for acute diverticulitis.

Vascular/Lymphatic: Stable atherosclerotic calcifications involving
the aorta iliac arteries. No aneurysm or dissection. The branch
vessels are patent. The major venous structures are patent.

No mesenteric or retroperitoneal mass or adenopathy. No pelvic
adenopathy or inguinal adenopathy.

Reproductive: The prostate gland and seminal vesicles are
unremarkable.

Other: No pelvic mass or adenopathy. No free pelvic fluid
collections. No inguinal mass or adenopathy. No abdominal wall
hernia or subcutaneous lesions.

Musculoskeletal: No significant bony findings.
IMPRESSION: 1. Stable CT appearance of the chest, abdomen and pelvis. No
findings for recurrent lymphoma.
2. Stable moderate-sized hiatal hernia.
3. Advanced sigmoid colon diverticulosis without findings for acute
diverticulitis.
4. Emphysema and aortic atherosclerosis.

Aortic Atherosclerosis (3Y87X-8WD.D) and Emphysema (3Y87X-3D6.G).

## 2021-02-05 ENCOUNTER — Encounter: Payer: Self-pay | Admitting: Internal Medicine

## 2021-02-19 ENCOUNTER — Encounter: Payer: Self-pay | Admitting: Hematology & Oncology

## 2021-02-19 ENCOUNTER — Other Ambulatory Visit: Payer: Self-pay

## 2021-02-19 ENCOUNTER — Inpatient Hospital Stay: Payer: PPO | Attending: Hematology & Oncology

## 2021-02-19 ENCOUNTER — Inpatient Hospital Stay: Payer: PPO | Admitting: Hematology & Oncology

## 2021-02-19 VITALS — BP 135/57 | HR 80 | Temp 99.2°F | Resp 18 | Wt 205.0 lb

## 2021-02-19 DIAGNOSIS — C8583 Other specified types of non-Hodgkin lymphoma, intra-abdominal lymph nodes: Secondary | ICD-10-CM | POA: Diagnosis not present

## 2021-02-19 LAB — CBC WITH DIFFERENTIAL (CANCER CENTER ONLY)
Abs Immature Granulocytes: 0.06 10*3/uL (ref 0.00–0.07)
Basophils Absolute: 0 10*3/uL (ref 0.0–0.1)
Basophils Relative: 0 %
Eosinophils Absolute: 0 10*3/uL (ref 0.0–0.5)
Eosinophils Relative: 0 %
HCT: 36.3 % — ABNORMAL LOW (ref 39.0–52.0)
Hemoglobin: 11.9 g/dL — ABNORMAL LOW (ref 13.0–17.0)
Immature Granulocytes: 1 %
Lymphocytes Relative: 37 %
Lymphs Abs: 1.8 10*3/uL (ref 0.7–4.0)
MCH: 31.4 pg (ref 26.0–34.0)
MCHC: 32.8 g/dL (ref 30.0–36.0)
MCV: 95.8 fL (ref 80.0–100.0)
Monocytes Absolute: 0.9 10*3/uL (ref 0.1–1.0)
Monocytes Relative: 18 %
Neutro Abs: 2.1 10*3/uL (ref 1.7–7.7)
Neutrophils Relative %: 44 %
Platelet Count: 108 10*3/uL — ABNORMAL LOW (ref 150–400)
RBC: 3.79 MIL/uL — ABNORMAL LOW (ref 4.22–5.81)
RDW: 13.2 % (ref 11.5–15.5)
WBC Count: 4.8 10*3/uL (ref 4.0–10.5)
nRBC: 0 % (ref 0.0–0.2)

## 2021-02-19 LAB — CMP (CANCER CENTER ONLY)
ALT: 15 U/L (ref 0–44)
AST: 25 U/L (ref 15–41)
Albumin: 4.4 g/dL (ref 3.5–5.0)
Alkaline Phosphatase: 59 U/L (ref 38–126)
Anion gap: 8 (ref 5–15)
BUN: 29 mg/dL — ABNORMAL HIGH (ref 8–23)
CO2: 29 mmol/L (ref 22–32)
Calcium: 10 mg/dL (ref 8.9–10.3)
Chloride: 101 mmol/L (ref 98–111)
Creatinine: 1.92 mg/dL — ABNORMAL HIGH (ref 0.61–1.24)
GFR, Estimated: 37 mL/min — ABNORMAL LOW (ref >60)
Glucose, Bld: 90 mg/dL (ref 70–99)
Potassium: 4 mmol/L (ref 3.5–5.1)
Sodium: 138 mmol/L (ref 135–145)
Total Bilirubin: 0.7 mg/dL (ref 0.3–1.2)
Total Protein: 7.2 g/dL (ref 6.5–8.1)

## 2021-02-19 LAB — LACTATE DEHYDROGENASE: LDH: 268 U/L — ABNORMAL HIGH (ref 98–192)

## 2021-02-19 NOTE — Progress Notes (Signed)
Hematology and Oncology Follow Up Visit  [name] [name] [id number] 1950-12-10 70 y.o. 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 is back for follow-up.  Had little bit of a tough time over Thanksgiving.  His wife had COVID.  Thankfully, she got through this okay.  He has had COVID in the past. .  He has had no problems with the venetoclax.  He has had no issues with his kidney function.  He has mild renal insufficiency.  This is not worsening.  He has had no issues with eating or bruising.  He does have some excoriations on his skin.  There is no coughing.  He has had a good appetite.  He has had no nausea or vomiting.  There is visible bit of leg swelling.  Again this is chronic.  His last IgM level was down to 800 mg/dL.  Currently, I would say his performance status is probably ECOG 1.    Medications:  Current Outpatient Medications:    cyclobenzaprine (FLEXERIL) 10 MG tablet, Take 1 tablet (10 mg total) by mouth 3 (three) times daily as needed for muscle spasms., Disp: 30 tablet, Rfl: 2   furosemide (LASIX) 40 MG tablet, Take 40 mg by mouth daily., Disp: , Rfl:    hydrALAZINE (APRESOLINE) 100 MG tablet, TAKE 1 TABLET BY MOUTH THREE TIMES A DAY, Disp: 270 tablet, Rfl: 1   ibrutinib (IMBRUVICA) 420 MG TABS, Take 420 mg by mouth daily., Disp: 28 tablet, Rfl: 11   levothyroxine (SYNTHROID) 150 MCG tablet, TAKE 1 TABLET BY MOUTH EVERY DAY, Disp: 90 tablet, Rfl: 1   Melatonin 10 MG SUBL, Place 10 mg under the tongue as needed., Disp: , Rfl:    Multiple Vitamins-Minerals (CENTRUM SILVER 50+MEN PO), Take by mouth., Disp: , Rfl:    prochlorperazine (COMPAZINE) 10 MG tablet, Take 1 tablet (10 mg total) by mouth every 6 (six) hours as needed for nausea or vomiting., Disp:  30 tablet, Rfl: 1   rosuvastatin (CRESTOR) 20 MG tablet, TAKE 1 TABLET BY MOUTH EVERY DAY, Disp: 90 tablet, Rfl: 1   venetoclax (VENCLEXTA) 100 MG tablet, TAKE 2 TABLETS (200 MG TOTAL) BY MOUTH DAILY. TABLETS SHOULD BE SWALLOWED WHOLE WITH A MEAL AND A FULL GLASS OF WATER., Disp: 60 tablet, Rfl: 6  Allergies: No Known Allergies  Past Medical History, Surgical history, Social history, and Family History were reviewed and updated.  Review of Systems: Review of Systems  Constitutional: Negative.   HENT:  Negative.    Eyes: Negative.   Respiratory: Negative.    Cardiovascular: Negative.   Gastrointestinal:  Positive for abdominal pain and nausea.  Endocrine: Negative.   Genitourinary: Negative.    Musculoskeletal: Negative.   Skin: Negative.   Neurological: Negative.   Hematological: Negative.   Psychiatric/Behavioral: Negative.     Physical Exam:  weight is 205 lb (93 kg). His oral temperature is 99.2 F (37.3 C). His blood pressure is 135/57 (abnormal) and his pulse is 80. His respiration is 18 and oxygen saturation is 98%.   Wt Readings from Last 3 Encounters:  02/19/21 205 lb (93 kg)  11/17/20 210 lb (95.3 kg)  11/16/20 209 lb 1.9 oz (94.9 kg)    Physical Exam Vitals reviewed.  HENT:  Head: Normocephalic and atraumatic.  Eyes:     Pupils: Pupils are equal, round, and reactive to light.  Cardiovascular:     Rate and Rhythm: Normal rate and regular rhythm.     Heart sounds: Normal heart sounds.  Pulmonary:     Effort: Pulmonary effort is normal.     Breath sounds: Normal breath sounds.  Abdominal:     General: Bowel sounds are normal.     Palpations: Abdomen is soft.  Musculoskeletal:        General: No tenderness or deformity. Normal range of motion.     Cervical back: Normal range of motion.  Lymphadenopathy:     Cervical: No cervical adenopathy.  Skin:    General: Skin is warm and dry.     Findings: No erythema or rash.  Neurological:     Mental Status:  He is alert and oriented to person, place, and time.  Psychiatric:        Behavior: Behavior normal.        Thought Content: Thought content normal.        Judgment: Judgment normal.    Lab Results  Component Value Date   WBC 4.8 02/19/2021   HGB 11.9 (L) 02/19/2021   HCT 36.3 (L) 02/19/2021   MCV 95.8 02/19/2021   PLT 108 (L) 02/19/2021     Chemistry      Component Value Date/Time   NA 138 02/19/2021 0926   NA 138 12/06/2019 1501   NA 142 04/25/2016 0844   NA 140 10/19/2015 0903   K 4.0 02/19/2021 0926   K 4.1 04/25/2016 0844   K 4.1 10/19/2015 0903   CL 101 02/19/2021 0926   CL 102 04/25/2016 0844   CO2 29 02/19/2021 0926   CO2 29 04/25/2016 0844   CO2 25 10/19/2015 0903   BUN 29 (H) 02/19/2021 0926   BUN 20 12/06/2019 1501   BUN 14 04/25/2016 0844   BUN 21.6 10/19/2015 0903   CREATININE 1.92 (H) 02/19/2021 0926   CREATININE 1.1 04/25/2016 0844   CREATININE 1.3 10/19/2015 0903      Component Value Date/Time   CALCIUM 10.0 02/19/2021 0926   CALCIUM 9.6 04/25/2016 0844   CALCIUM 9.4 10/19/2015 0903   ALKPHOS 59 02/19/2021 0926   ALKPHOS 59 04/25/2016 0844   ALKPHOS 68 10/19/2015 0903   AST 25 02/19/2021 0926   AST 21 10/19/2015 0903   ALT 15 02/19/2021 0926   ALT 27 04/25/2016 0844   ALT 19 10/19/2015 0903   BILITOT 0.7 02/19/2021 0926   BILITOT 0.54 10/19/2015 0903      Impression and Plan: [name] is a 70 year old white male.  We treated him 9 years ago.  He had a marginal zone lymphoma.  He went into clinical remission.  When he initially presented, he had splenomegaly.  When he relapsed, he again had splenomegaly.    I will go ahead and get another CT scan on him.  We will do this in March.  We will do this in the same day that I see him.  We may think about getting him off treatment once we see what the CT scan looks like.  I think this would be reasonable.  I am glad that his wife is not getting better.  I know he will have a wonderful Christmas  holiday.  Hopefully his son will come in from [place].   [name], MD 12/2/202210:32 AM

## 2021-02-20 LAB — IGG, IGA, IGM
IgA: 167 mg/dL (ref 61–437)
IgG (Immunoglobin G), Serum: 630 mg/dL (ref 603–1613)
IgM (Immunoglobulin M), Srm: 634 mg/dL — ABNORMAL HIGH (ref 20–172)

## 2021-03-19 ENCOUNTER — Other Ambulatory Visit: Payer: Self-pay | Admitting: Internal Medicine

## 2021-03-23 DIAGNOSIS — N1832 Chronic kidney disease, stage 3b: Secondary | ICD-10-CM | POA: Diagnosis not present

## 2021-03-24 DIAGNOSIS — N1832 Chronic kidney disease, stage 3b: Secondary | ICD-10-CM | POA: Diagnosis not present

## 2021-03-27 IMAGING — US US RENAL
2 series · 14 of 25 positions shown · non-contrast
Comparison: 02/27/2020 CT chest, abdomen and pelvis.

CLINICAL DATA: Acute renal failure. Hyperglycemia. History of
lymphoma.

EXAM:
RENAL / URINARY TRACT ULTRASOUND COMPLETE

[Series 1: us renal · 13 of 34 slices shown (1 of 2)]
[im 1/34]
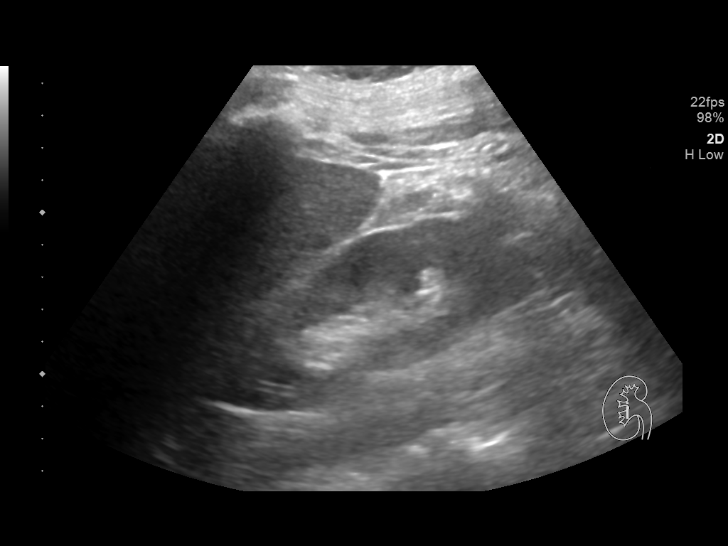
[im 3/34]
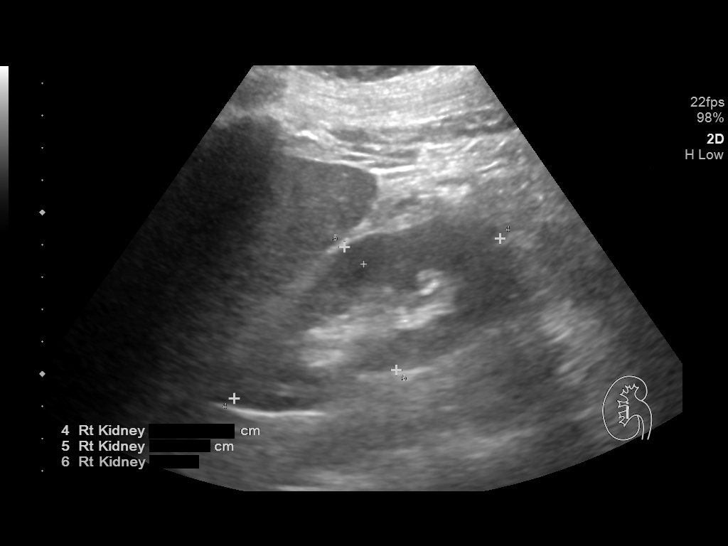
[im 6/34]
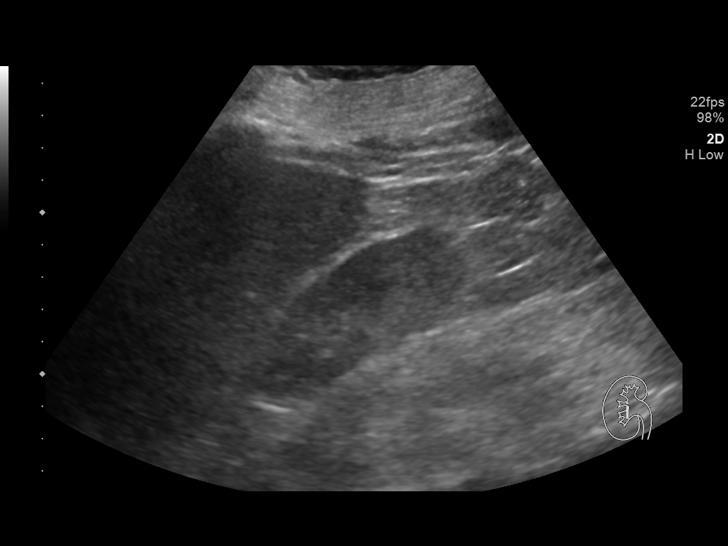
[im 9/34]
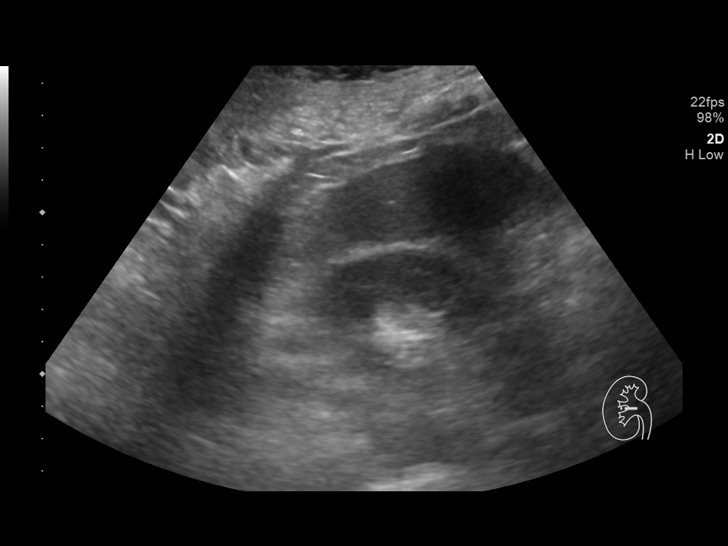
[im 12/34]
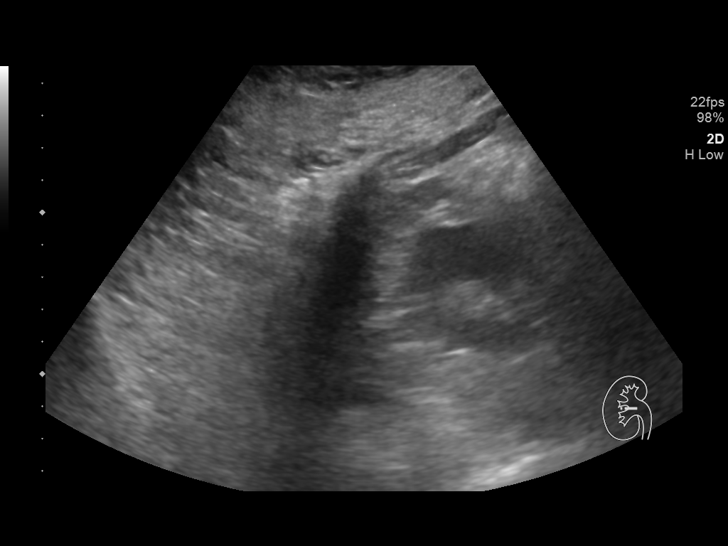
[im 13/34]
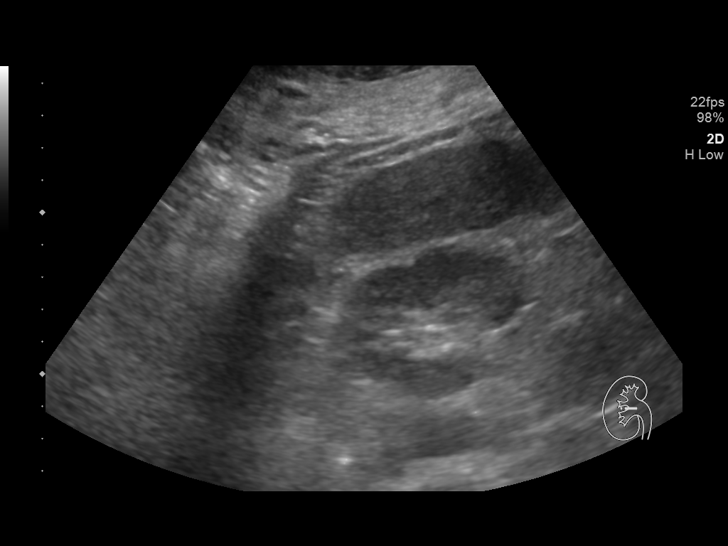
[im 16/34]
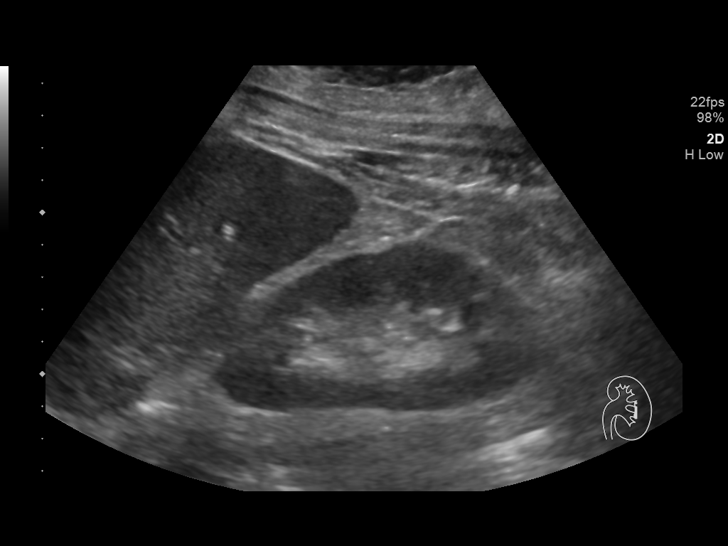
[im 19/34]
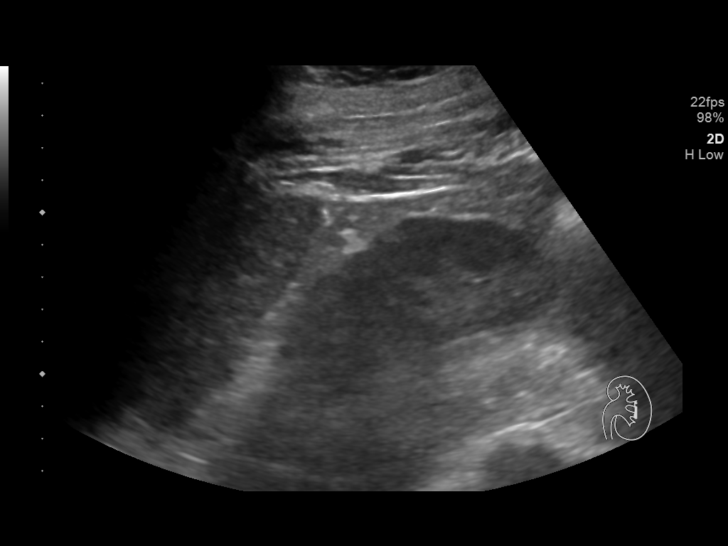
[im 22/34]
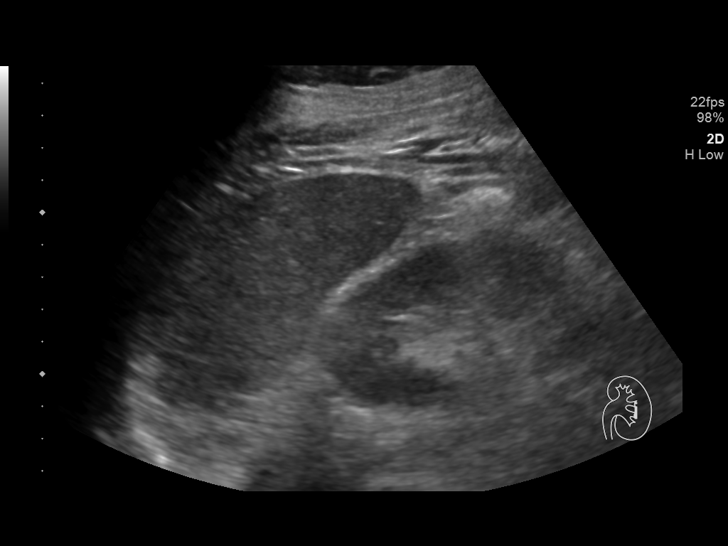
[im 23/34]
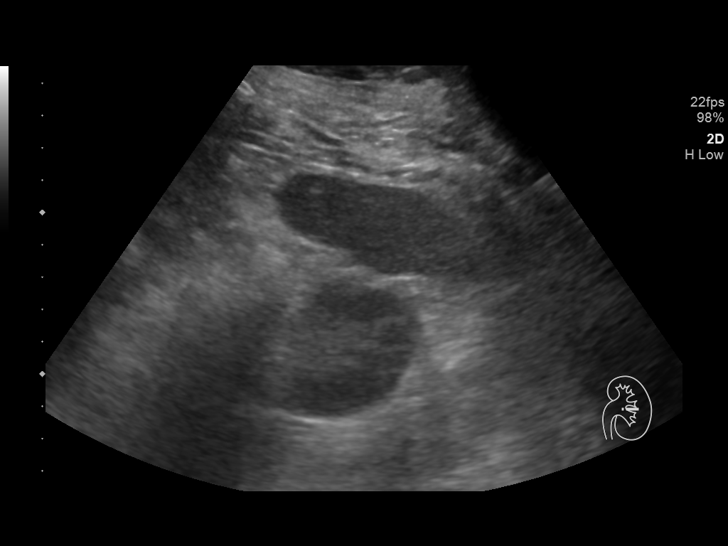
[im 26/34]
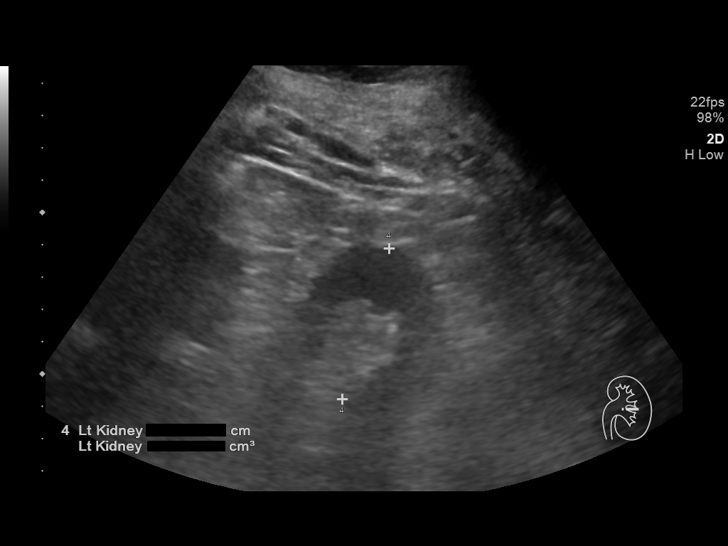
[im 29/34]
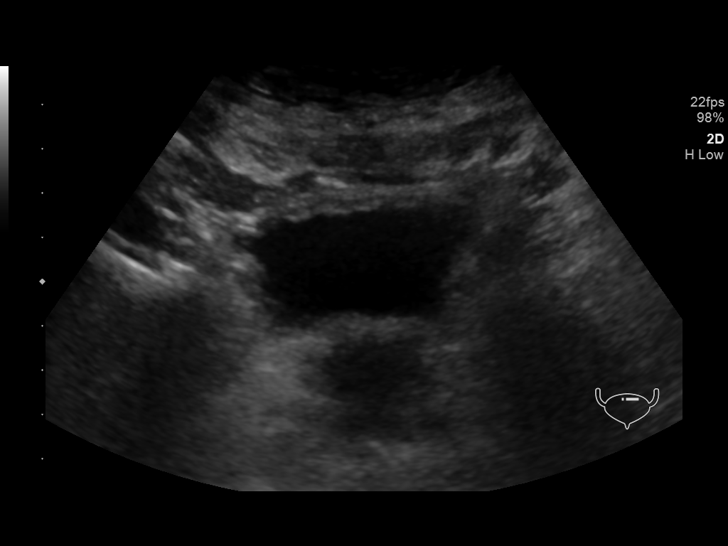
[im 32/34]
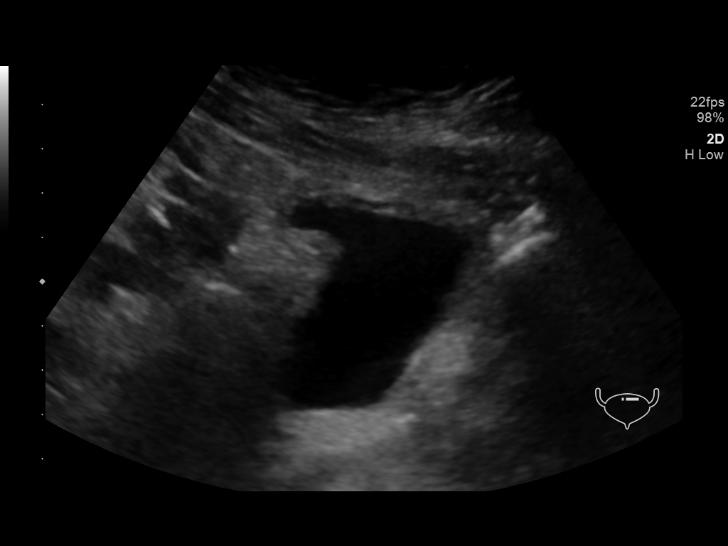

[Series 1001: us renal · 1 of 2 slices shown (2 of 2)]
[im 1/2]
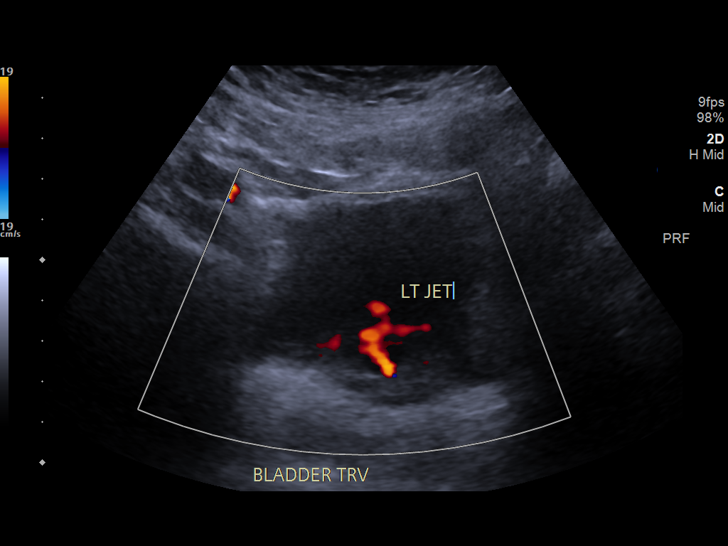

[14 of 25 positions shown; findings below may reference images not displayed]

FINDINGS: Right Kidney:

Renal measurements: 9.6 x 4.1 x 5.1 cm = volume: 106 mL.
Echogenicity within normal limits. No mass or hydronephrosis
visualized.

Left Kidney:

Renal measurements: 10.8 x 4.8 x 4.9 cm = volume: 33 mL.
Echogenicity within normal limits. No mass or hydronephrosis
visualized.

Bladder:

Appears normal for degree of bladder distention. Bilateral ureteral
jets seen in the bladder.

Other:

None.
IMPRESSION: Normal ultrasound of the kidneys and bladder.  No hydronephrosis.

## 2021-03-30 DIAGNOSIS — I129 Hypertensive chronic kidney disease with stage 1 through stage 4 chronic kidney disease, or unspecified chronic kidney disease: Secondary | ICD-10-CM | POA: Diagnosis not present

## 2021-03-30 DIAGNOSIS — N1832 Chronic kidney disease, stage 3b: Secondary | ICD-10-CM | POA: Diagnosis not present

## 2021-03-30 DIAGNOSIS — C8580 Other specified types of non-Hodgkin lymphoma, unspecified site: Secondary | ICD-10-CM | POA: Diagnosis not present

## 2021-03-30 DIAGNOSIS — R809 Proteinuria, unspecified: Secondary | ICD-10-CM | POA: Diagnosis not present

## 2021-03-30 DIAGNOSIS — E875 Hyperkalemia: Secondary | ICD-10-CM | POA: Diagnosis not present

## 2021-04-29 DIAGNOSIS — L570 Actinic keratosis: Secondary | ICD-10-CM | POA: Diagnosis not present

## 2021-04-29 DIAGNOSIS — X32XXXD Exposure to sunlight, subsequent encounter: Secondary | ICD-10-CM | POA: Diagnosis not present

## 2021-04-29 DIAGNOSIS — Z1283 Encounter for screening for malignant neoplasm of skin: Secondary | ICD-10-CM | POA: Diagnosis not present

## 2021-04-29 DIAGNOSIS — Z08 Encounter for follow-up examination after completed treatment for malignant neoplasm: Secondary | ICD-10-CM | POA: Diagnosis not present

## 2021-04-29 DIAGNOSIS — L821 Other seborrheic keratosis: Secondary | ICD-10-CM | POA: Diagnosis not present

## 2021-04-29 DIAGNOSIS — Z86006 Personal history of melanoma in-situ: Secondary | ICD-10-CM | POA: Diagnosis not present

## 2021-05-11 NOTE — Progress Notes (Signed)
Cardiology Office Note   Date:  05/18/2021   ID:  [name] [name], DOB September 11, 1950, MRN [id number]  PCP:  Binnie Rail, MD  Cardiologist:   Celes Dedic Martinique, MD   Chief Complaint  Patient presents with   Coronary Artery Disease       History of Present Illness: [name] [name] is a 71 y.o. male who is seen for follow up CAD/coronary calcification.  He has a history of HLD and hypothyroidism. 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 Now on Imbruvica.  CT in February 2021 showed calcification in the left main coronary artery.    He retired from the [employer] in 2008. Prior to that he had yearly stress testing done. He has no history of DM, HTN, or tobacco use.  He does have a family history of CAD with father dying at age 73 with MI. He denies any symptoms of chest pain, SOB, palpitations. ETT in April 2021 was normal.   He was followed closely in our HTN clinic. He developed HTN while on Imbruvica for treatment of lymphoma. Was on  Valsartan and triamterene HCT. Developed edema on amlodipine. He then developed worsening renal function with creatinine up to 2.14 and potassium up to 6.6.  Renal US was normal. His BP medication was switched to hydralazine 100 mg tid. He has done well with this. Last creatinine 1.92. reports BP around 701 systolic. No chest pain. CT in August 2022- stable from lymphoma standpoint. Aortic root measured 4 cm.   He has lost 5 lbs. BP has been well controlled. He is active doing yard work. No chest pain or SOB. No edema. Scheduled for repeat CT scan next week.   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 30 tablet 2   furosemide (LASIX) 40 MG tablet Take 40 mg by mouth daily.     hydrALAZINE (APRESOLINE) 100 MG tablet TAKE 1 TABLET BY MOUTH THREE TIMES A DAY 270 tablet 1   ibrutinib (IMBRUVICA) 420 MG TABS Take 420 mg by mouth daily. 28 tablet 11   levothyroxine (SYNTHROID) 150 MCG tablet TAKE 1 TABLET BY MOUTH EVERY DAY 90 tablet 1   Melatonin 10 MG SUBL Place 10 mg under the tongue as needed.     Multiple Vitamins-Minerals (CENTRUM SILVER 50+MEN PO) Take by mouth.     prochlorperazine (COMPAZINE) 10 MG tablet Take 1 tablet (10 mg total) by mouth every 6 (six) hours as needed for nausea or vomiting. 30 tablet 1   rosuvastatin (CRESTOR) 20 MG tablet TAKE 1 TABLET BY MOUTH EVERY DAY 90 tablet 1   venetoclax (VENCLEXTA) 100 MG tablet TAKE 2 TABLETS (200 MG TOTAL) BY MOUTH DAILY. TABLETS SHOULD BE SWALLOWED WHOLE WITH  A MEAL AND A FULL GLASS OF WATER. 60 tablet 6   No current facility-administered medications for this visit.    Allergies:   Patient has no known allergies.    Social History:  The patient  reports that he has never smoked. He has never used smokeless tobacco. He reports current alcohol use. He reports that he does not use drugs.   Family History:  The patient's family history includes Alzheimer's disease in his mother; COPD in his brother; Coronary artery disease in his brother; Dementia in his brother; Diabetes in his brother; Heart attack (age of onset: 2) in his father; Heart disease in his paternal grandfather; Hypertension in his brother; Stroke in his brother.    ROS:  Please see the history of present illness.   Otherwise, review of systems are positive  for none.   All other systems are reviewed and negative.    PHYSICAL EXAM: VS:  BP 128/76    Pulse 76    Ht 5\' 11"  (1.803 m)    Wt 208 lb 6.4 oz (94.5 kg)    BMI 29.07 kg/m  , BMI Body mass index is 29.07 kg/m. GEN: Well nourished, well developed, in no acute distress  HEENT: normal  Neck: no JVD, carotid bruits, or masses Cardiac: RRR; no murmurs, rubs, or gallops,no edema  Respiratory:  clear to auscultation bilaterally, normal work of breathing GI: soft, nontender, nondistended, + BS MS: no deformity or atrophy  Skin: warm and dry, no rash Neuro:  Strength and sensation are intact Psych: euthymic mood, full affect   EKG:  EKG is ordered today. The ekg ordered today demonstrates NSR rate 76. Normal. I have personally reviewed and interpreted this study.    Recent Labs: 02/19/2021: ALT 15; BUN 29; Creatinine 1.92; Hemoglobin 11.9; Platelet Count 108; Potassium 4.0; Sodium 138    Lipid Panel    Component Value Date/Time   CHOL 140 08/07/2019 0857   CHOL 161 10/19/2015 0903   TRIG 108.0 08/07/2019 0857   TRIG 175 (H) 10/19/2015 0903   TRIG 106 02/22/2006 1133   HDL 47.80 08/07/2019 0857   HDL 40 10/19/2015 0903   CHOLHDL 3 08/07/2019 0857   VLDL 21.6 08/07/2019 0857   LDLCALC 71 08/07/2019 0857   LDLCALC 86 10/19/2015 0903      Wt Readings from Last 3 Encounters:  05/18/21 208 lb 6.4 oz (94.5 kg)  02/19/21 205 lb (93 kg)  11/17/20 210 lb (95.3 kg)      Other studies Reviewed: Additional studies/ records that were reviewed today include:   ETT 07/04/20: Study Highlights  Blood pressure demonstrated a hypertensive response to exercise. Clinically negative, electrically negative for ischemia   ASSESSMENT AND PLAN:  1.  Coronary calcification.  focal calcification of the left main. Otherwise no significant calcification. He is asymptomatic. Risk factors of hypercholesterolemia and family history of premature CAD. ETT was normal in 2021. Recommend risk factor  modification.   2. Hypercholesterolemia. Now on  Crestor. Goal LDL < 70. Will add lipid panel with next blood draw.   3. Marginal zone lymphoma recurrent. Per oncology  4. HTN- currently under good control with hydralazine.   5. CKD stage 3b- followed by Dr Royce Macadamia with [facility]  6. Thoracic aortic enlargement. With lymphoma this can be followed on serial CT scans. Last measurement 4 cm in August 2022.   Current medicines are reviewed at length with the patient today.  The patient does not have concerns regarding medicines.  The following changes have been made:  no change  Labs/ tests ordered today include:   No orders of the defined types were placed in this encounter.     Disposition:   FU one  year  Signed, Texie Tupou Martinique, MD  05/18/2021 8:41 AM    [facility] [address] [place], [place], [address] Phone [phone], Fax [phone]

## 2021-05-18 ENCOUNTER — Encounter: Payer: Self-pay | Admitting: Hematology & Oncology

## 2021-05-18 ENCOUNTER — Other Ambulatory Visit: Payer: Self-pay

## 2021-05-18 ENCOUNTER — Encounter: Payer: Self-pay | Admitting: Cardiology

## 2021-05-18 ENCOUNTER — Ambulatory Visit: Payer: PPO | Admitting: Cardiology

## 2021-05-18 VITALS — BP 128/76 | HR 76 | Ht 71.0 in | Wt 208.4 lb

## 2021-05-18 DIAGNOSIS — I159 Secondary hypertension, unspecified: Secondary | ICD-10-CM | POA: Diagnosis not present

## 2021-05-18 DIAGNOSIS — R931 Abnormal findings on diagnostic imaging of heart and coronary circulation: Secondary | ICD-10-CM

## 2021-05-18 DIAGNOSIS — E7849 Other hyperlipidemia: Secondary | ICD-10-CM

## 2021-05-18 NOTE — Addendum Note (Signed)
Addended by: Kathyrn Lass on: 05/18/2021 08:46 AM   Modules accepted: Orders

## 2021-05-20 ENCOUNTER — Other Ambulatory Visit: Payer: Self-pay | Admitting: *Deleted

## 2021-05-20 DIAGNOSIS — R739 Hyperglycemia, unspecified: Secondary | ICD-10-CM

## 2021-05-20 DIAGNOSIS — C8583 Other specified types of non-Hodgkin lymphoma, intra-abdominal lymph nodes: Secondary | ICD-10-CM

## 2021-05-21 ENCOUNTER — Other Ambulatory Visit: Payer: Self-pay

## 2021-05-21 ENCOUNTER — Inpatient Hospital Stay: Payer: PPO | Attending: Hematology & Oncology | Admitting: Hematology & Oncology

## 2021-05-21 ENCOUNTER — Encounter: Payer: Self-pay | Admitting: Hematology & Oncology

## 2021-05-21 ENCOUNTER — Other Ambulatory Visit: Payer: PPO

## 2021-05-21 ENCOUNTER — Inpatient Hospital Stay: Payer: PPO

## 2021-05-21 ENCOUNTER — Ambulatory Visit (HOSPITAL_BASED_OUTPATIENT_CLINIC_OR_DEPARTMENT_OTHER)
Admission: RE | Admit: 2021-05-21 | Discharge: 2021-05-21 | Disposition: A | Discharge status: Home / Self Care | Payer: PPO | Source: Ambulatory Visit | Attending: Hematology & Oncology | Admitting: Hematology & Oncology

## 2021-05-21 VITALS — BP 158/79 | HR 69 | Temp 98.1°F | Resp 17 | Wt 210.0 lb

## 2021-05-21 DIAGNOSIS — I700 Atherosclerosis of aorta: Secondary | ICD-10-CM | POA: Diagnosis not present

## 2021-05-21 DIAGNOSIS — C8583 Other specified types of non-Hodgkin lymphoma, intra-abdominal lymph nodes: Secondary | ICD-10-CM

## 2021-05-21 DIAGNOSIS — E7849 Other hyperlipidemia: Secondary | ICD-10-CM | POA: Diagnosis not present

## 2021-05-21 DIAGNOSIS — I2510 Atherosclerotic heart disease of native coronary artery without angina pectoris: Secondary | ICD-10-CM | POA: Diagnosis not present

## 2021-05-21 DIAGNOSIS — I159 Secondary hypertension, unspecified: Secondary | ICD-10-CM | POA: Diagnosis not present

## 2021-05-21 DIAGNOSIS — R739 Hyperglycemia, unspecified: Secondary | ICD-10-CM

## 2021-05-21 DIAGNOSIS — K449 Diaphragmatic hernia without obstruction or gangrene: Secondary | ICD-10-CM | POA: Diagnosis not present

## 2021-05-21 DIAGNOSIS — R931 Abnormal findings on diagnostic imaging of heart and coronary circulation: Secondary | ICD-10-CM | POA: Diagnosis not present

## 2021-05-21 DIAGNOSIS — C8590 Non-Hodgkin lymphoma, unspecified, unspecified site: Secondary | ICD-10-CM | POA: Diagnosis not present

## 2021-05-21 LAB — CMP (CANCER CENTER ONLY)
ALT: 16 U/L (ref 0–44)
AST: 25 U/L (ref 15–41)
Albumin: 4 g/dL (ref 3.5–5.0)
Alkaline Phosphatase: 48 U/L (ref 38–126)
Anion gap: 6 (ref 5–15)
BUN: 26 mg/dL — ABNORMAL HIGH (ref 8–23)
CO2: 30 mmol/L (ref 22–32)
Calcium: 9.3 mg/dL (ref 8.9–10.3)
Chloride: 104 mmol/L (ref 98–111)
Creatinine: 1.73 mg/dL — ABNORMAL HIGH (ref 0.61–1.24)
GFR, Estimated: 42 mL/min — ABNORMAL LOW (ref >60)
Glucose, Bld: 77 mg/dL (ref 70–99)
Potassium: 4.5 mmol/L (ref 3.5–5.1)
Sodium: 140 mmol/L (ref 135–145)
Total Bilirubin: 0.7 mg/dL (ref 0.3–1.2)
Total Protein: 6.8 g/dL (ref 6.5–8.1)

## 2021-05-21 LAB — CBC WITH DIFFERENTIAL (CANCER CENTER ONLY)
Abs Immature Granulocytes: 0.33 10*3/uL — ABNORMAL HIGH (ref 0.00–0.07)
Basophils Absolute: 0 10*3/uL (ref 0.0–0.1)
Basophils Relative: 1 %
Eosinophils Absolute: 0 10*3/uL (ref 0.0–0.5)
Eosinophils Relative: 0 %
HCT: 35.1 % — ABNORMAL LOW (ref 39.0–52.0)
Hemoglobin: 11.6 g/dL — ABNORMAL LOW (ref 13.0–17.0)
Immature Granulocytes: 9 %
Lymphocytes Relative: 42 %
Lymphs Abs: 1.6 10*3/uL (ref 0.7–4.0)
MCH: 31.6 pg (ref 26.0–34.0)
MCHC: 33 g/dL (ref 30.0–36.0)
MCV: 95.6 fL (ref 80.0–100.0)
Monocytes Absolute: 0.6 10*3/uL (ref 0.1–1.0)
Monocytes Relative: 16 %
Neutro Abs: 1.2 10*3/uL — ABNORMAL LOW (ref 1.7–7.7)
Neutrophils Relative %: 32 %
Platelet Count: 107 10*3/uL — ABNORMAL LOW (ref 150–400)
RBC: 3.67 MIL/uL — ABNORMAL LOW (ref 4.22–5.81)
RDW: 13.5 % (ref 11.5–15.5)
WBC Count: 3.7 10*3/uL — ABNORMAL LOW (ref 4.0–10.5)
nRBC: 0 % (ref 0.0–0.2)

## 2021-05-21 LAB — LACTATE DEHYDROGENASE: LDH: 232 U/L — ABNORMAL HIGH (ref 98–192)

## 2021-05-21 NOTE — Progress Notes (Signed)
?Hematology and Oncology Follow Up Visit ? ?[name] [name] ?[id number] ?24-Jul-1950 71 y.o. ?05/21/2021 ? ? ?Principle Diagnosis:  ?Marginal zone lymphoma-treated with R-CVP in February 2013 -- relapsed ? ?Current Therapy:   ?Imbruvica 420 mg po q day - changed on 01/02/2020 -- d/c on 06/2021 ?Venetoclax 200 mg po q day -- start on 06/19/2020 - d/c on 05/24/2021 ?    ?Interim History:  [name] is back for follow-up.  He looks good.  He had no problems over the holiday season.  He did not have COVID.  His wife got COVID I think before Thanksgiving.  She is doing well right now. ? ?I think that we can probably stop the medications for his recurrent marginal zone.  He says he just got new shipment of Imbruvica in.  I told him to take what ever he received and then once he finishes that, he can stop. ? ?He has had no problems with nausea or vomiting.  He has had little bit of leg swelling.  He will be interesting to see how his kidneys look. ? ?There is been no fever.  He has had no bleeding.  He has had no change in bowel or bladder habits.  There is been no diarrhea. ? ?He has had no rashes. ? ?He did have a CT scan done today.  There is no lymphadenopathy.  He had no splenomegaly.  A 4 mm pulmonary nodule had resolved.  He has some coronary artery disease.  There is small to moderate hiatal hernia. ? ?His appetite has been quite good. ? ?His last IgM level was 564 mg/dL.  His last monoclonal spike back in August of last year was 0.6 g/dL. ? ?Overall, I would say his performance status is ECOG 0. ? ? ?Medications:  ?Current Outpatient Medications:  ?  cyclobenzaprine (FLEXERIL) 10 MG tablet, Take 1 tablet (10 mg total) by mouth 3 (three) times daily as needed for muscle spasms., Disp: 30 tablet, Rfl: 2 ?  furosemide (LASIX) 40 MG tablet, Take 40 mg by mouth daily., Disp: , Rfl:  ?  hydrALAZINE (APRESOLINE) 100 MG tablet, TAKE 1 TABLET BY MOUTH THREE TIMES A DAY, Disp: 270 tablet, Rfl: 1 ?  ibrutinib (IMBRUVICA) 420 MG  TABS, Take 420 mg by mouth daily., Disp: 28 tablet, Rfl: 11 ?  levothyroxine (SYNTHROID) 150 MCG tablet, TAKE 1 TABLET BY MOUTH EVERY DAY, Disp: 90 tablet, Rfl: 1 ?  Melatonin 10 MG SUBL, Place 10 mg under the tongue as needed., Disp: , Rfl:  ?  Multiple Vitamins-Minerals (CENTRUM SILVER 50+MEN PO), Take by mouth., Disp: , Rfl:  ?  prochlorperazine (COMPAZINE) 10 MG tablet, Take 1 tablet (10 mg total) by mouth every 6 (six) hours as needed for nausea or vomiting., Disp: 30 tablet, Rfl: 1 ?  rosuvastatin (CRESTOR) 20 MG tablet, TAKE 1 TABLET BY MOUTH EVERY DAY, Disp: 90 tablet, Rfl: 1 ?  venetoclax (VENCLEXTA) 100 MG tablet, TAKE 2 TABLETS (200 MG TOTAL) BY MOUTH DAILY. TABLETS SHOULD BE SWALLOWED WHOLE WITH A MEAL AND A FULL GLASS OF WATER., Disp: 60 tablet, Rfl: 6 ? ?Allergies: No Known Allergies ? ?Past Medical History, Surgical history, Social history, and Family History were reviewed and updated. ? ?Review of Systems: ?Review of Systems  ?Constitutional: Negative.   ?HENT:  Negative.    ?Eyes: Negative.   ?Respiratory: Negative.    ?Cardiovascular: Negative.   ?Gastrointestinal:  Positive for abdominal pain and nausea.  ?Endocrine: Negative.   ?Genitourinary: Negative.    ?  Musculoskeletal: Negative.   ?Skin: Negative.   ?Neurological: Negative.   ?Hematological: Negative.   ?Psychiatric/Behavioral: Negative.    ? ?Physical Exam: ? weight is 210 lb (95.3 kg). His oral temperature is 98.1 ?F (36.7 ?C). His blood pressure is 158/79 (abnormal) and his pulse is 69. His respiration is 17 and oxygen saturation is 100%.  ? ?Wt Readings from Last 3 Encounters:  ?05/21/21 210 lb (95.3 kg)  ?05/18/21 208 lb 6.4 oz (94.5 kg)  ?02/19/21 205 lb (93 kg)  ? ? ?Physical Exam ?Vitals reviewed.  ?HENT:  ?   Head: Normocephalic and atraumatic.  ?Eyes:  ?   Pupils: Pupils are equal, round, and reactive to light.  ?Cardiovascular:  ?   Rate and Rhythm: Normal rate and regular rhythm.  ?   Heart sounds: Normal heart sounds.   ?Pulmonary:  ?   Effort: Pulmonary effort is normal.  ?   Breath sounds: Normal breath sounds.  ?Abdominal:  ?   General: Bowel sounds are normal.  ?   Palpations: Abdomen is soft.  ?Musculoskeletal:     ?   General: No tenderness or deformity. Normal range of motion.  ?   Cervical back: Normal range of motion.  ?Lymphadenopathy:  ?   Cervical: No cervical adenopathy.  ?Skin: ?   General: Skin is warm and dry.  ?   Findings: No erythema or rash.  ?Neurological:  ?   Mental Status: He is alert and oriented to person, place, and time.  ?Psychiatric:     ?   Behavior: Behavior normal.     ?   Thought Content: Thought content normal.     ?   Judgment: Judgment normal.  ? ? ?Lab Results  ?Component Value Date  ? WBC 3.7 (L) 05/21/2021  ? HGB 11.6 (L) 05/21/2021  ? HCT 35.1 (L) 05/21/2021  ? MCV 95.6 05/21/2021  ? PLT 107 (L) 05/21/2021  ? ?  Chemistry   ?   ?Component Value Date/Time  ? NA 140 05/21/2021 0819  ? NA 138 12/06/2019 1501  ? NA 142 04/25/2016 0844  ? NA 140 10/19/2015 0903  ? K 4.5 05/21/2021 0819  ? K 4.1 04/25/2016 0844  ? K 4.1 10/19/2015 0903  ? CL 104 05/21/2021 0819  ? CL 102 04/25/2016 0844  ? CO2 30 05/21/2021 0819  ? CO2 29 04/25/2016 0844  ? CO2 25 10/19/2015 0903  ? BUN 26 (H) 05/21/2021 0819  ? BUN 20 12/06/2019 1501  ? BUN 14 04/25/2016 0844  ? BUN 21.6 10/19/2015 0903  ? CREATININE 1.73 (H) 05/21/2021 6283  ? CREATININE 1.1 04/25/2016 0844  ? CREATININE 1.3 10/19/2015 0903  ?    ?Component Value Date/Time  ? CALCIUM 9.3 05/21/2021 0819  ? CALCIUM 9.6 04/25/2016 0844  ? CALCIUM 9.4 10/19/2015 0903  ? ALKPHOS 48 05/21/2021 0819  ? ALKPHOS 59 04/25/2016 0844  ? ALKPHOS 68 10/19/2015 0903  ? AST 25 05/21/2021 0819  ? AST 21 10/19/2015 0903  ? ALT 16 05/21/2021 0819  ? ALT 27 04/25/2016 0844  ? ALT 19 10/19/2015 0903  ? BILITOT 0.7 05/21/2021 0819  ? BILITOT 0.54 10/19/2015 0903  ?  ? ? ?Impression and Plan: ?[name] is a 71 year old white male.  We treated him 9 years ago.  He had a marginal  zone lymphoma.  He went into clinical remission.  When he initially presented, he had splenomegaly.  When he relapsed, he again had splenomegaly.   ? ?  The CT scan certainly looks encouraging.  He still has a slightly elevated IgM level.  We will have to see what the monoclonal spike is..  I think that it be reasonable to stop his treatments.  He has been on the [place] for over a year.  He has been on the venetoclax for about a year. ? ?I would like to see him back in 3 months.  I do not think that we need another scan probably for 6 months. ? ?Again of interest and see how his kidney function looks been off treatment and the swelling in his legs. ? ? ?[name], MD ?3/3/202310:45 AM ?

## 2021-05-22 LAB — LIPID PANEL
Chol/HDL Ratio: 2.3 ratio (ref 0.0–5.0)
Cholesterol, Total: 136 mg/dL (ref 100–199)
HDL: 60 mg/dL (ref >39)
LDL Chol Calc (NIH): 59 mg/dL (ref 0–99)
Triglycerides: 90 mg/dL (ref 0–149)
VLDL Cholesterol Cal: 17 mg/dL (ref 5–40)

## 2021-05-22 LAB — IGG, IGA, IGM
IgA: 136 mg/dL (ref 61–437)
IgG (Immunoglobin G), Serum: 576 mg/dL — ABNORMAL LOW (ref 603–1613)
IgM (Immunoglobulin M), Srm: 564 mg/dL — ABNORMAL HIGH (ref 20–172)

## 2021-05-24 ENCOUNTER — Encounter: Payer: Self-pay | Admitting: Hematology & Oncology

## 2021-05-30 ENCOUNTER — Other Ambulatory Visit: Payer: Self-pay | Admitting: Internal Medicine

## 2021-05-30 ENCOUNTER — Other Ambulatory Visit: Payer: Self-pay | Admitting: Cardiology

## 2021-05-31 ENCOUNTER — Encounter: Payer: Self-pay | Admitting: Internal Medicine

## 2021-06-03 ENCOUNTER — Encounter: Payer: Self-pay | Admitting: Internal Medicine

## 2021-06-03 NOTE — Progress Notes (Signed)
? ? ?Subjective:  ? ? Patient ID: [name] [name], male    DOB: 26-May-1950, 71 y.o.   MRN: [id number] ? ?This visit occurred during the SARS-CoV-2 public health emergency.  Safety protocols were in place, including screening questions prior to the visit, additional usage of staff PPE, and extensive cleaning of exam room while observing appropriate contact time as indicated for disinfecting solutions. ? ? ? ?HPI ?[name] [name] is here for  ?Chief Complaint  ?Patient presents with  ? calf pain  ?  Left calf pain  ? ? ? ?Left calf pain - tenderness, achiness in left posterior calf.  It has been intermittent x 2-3 weeks.  Some days it is more painful than others.  Denies increased LLE edema - he has chronic b/l LE edema.  ? ? ? ?Medications and allergies reviewed with patient and updated if appropriate. ? ?Current Outpatient Medications on File Prior to Visit  ?Medication Sig Dispense Refill  ? furosemide (LASIX) 40 MG tablet Take 40 mg by mouth daily.    ? hydrALAZINE (APRESOLINE) 100 MG tablet TAKE 1 TABLET BY MOUTH THREE TIMES A DAY 270 tablet 1  ? levothyroxine (SYNTHROID) 150 MCG tablet TAKE 1 TABLET BY MOUTH EVERY DAY 30 tablet 0  ? Melatonin 10 MG SUBL Place 10 mg under the tongue as needed.    ? Multiple Vitamins-Minerals (CENTRUM SILVER 50+MEN PO) Take by mouth.    ? prochlorperazine (COMPAZINE) 10 MG tablet Take 1 tablet (10 mg total) by mouth every 6 (six) hours as needed for nausea or vomiting. 30 tablet 1  ? rosuvastatin (CRESTOR) 20 MG tablet TAKE 1 TABLET BY MOUTH EVERY DAY 90 tablet 1  ? ?No current facility-administered medications on file prior to visit.  ? ? ?Review of Systems  ?Constitutional:  Negative for fever.  ?Respiratory:  Negative for shortness of breath.   ?Cardiovascular:  Negative for chest pain.  ? ?   ?Objective:  ? ?Vitals:  ? 06/04/21 0954  ?BP: (!) 146/60  ?Pulse: 89  ?Temp: 98.5 ?F (36.9 ?C)  ?SpO2: 98%  ? ?BP Readings from Last 3 Encounters:  ?06/04/21 (!) 146/60  ?05/21/21 (!) 158/79   ?05/18/21 128/76  ? ?Wt Readings from Last 3 Encounters:  ?06/04/21 209 lb 6.4 oz (95 kg)  ?05/21/21 210 lb (95.3 kg)  ?05/18/21 208 lb 6.4 oz (94.5 kg)  ? ?Body mass index is 29.21 kg/m?. ? ?  ?Physical Exam ?Constitutional:   ?   General: He is not in acute distress. ?   Appearance: Normal appearance.  ?HENT:  ?   Head: Normocephalic.  ?Cardiovascular:  ?   Rate and Rhythm: Normal rate and regular rhythm.  ?   Heart sounds: No murmur heard. ?Pulmonary:  ?   Effort: Pulmonary effort is normal. No respiratory distress.  ?   Breath sounds: No wheezing or rales.  ?Musculoskeletal:     ?   General: Tenderness (left posterior calf tenderness) present.  ?   Right lower leg: Edema (mild) present.  ?   Left lower leg: Edema (mild - not more than usual) present.  ?   Comments: Positive homans sign  ?Skin: ?   General: Skin is warm and dry.  ?   Findings: No bruising, erythema or rash.  ?Neurological:  ?   Mental Status: He is alert.  ? ?   ?VAS [place] LOWER EXTREMITY VENOUS (DVT) ? Lower Venous DVT Study ? ?Patient Name:  [name] [name]  Date of  Exam:   06/04/2021 ?Medical Rec #: [id number]           Accession #:    [id number] ?Date of Birth: 1950/09/07            Patient Gender: M ?Patient Age:   70 years ?Exam Location:  [facility] ?Procedure:      VAS [place] LOWER EXTREMITY VENOUS (DVT) ?Referring Phys: Marzetta Board Chiffon Kittleson ? ?-------------------------------------------------------------------------------- ?  ?Indications: Pain. ?Other Indications: Intermittent pain x 2-3 weeks in the posterior aspect of the ?                   left proximal calf. No increased swelling-has chronic ?                   swelling in both legs and they are equal and stable. Positive ?                   [name]. ?                   Concern for DVT given his history of lymphoma-just stopped ?                   chemotherapy. ? ?Risk Factors: None identified Cancer lymphoma. ?Comparison Study: None ? ?Performing Technologist: [employer] BS, RVT, RDCS ? ?   ?Examination Guidelines: ?A complete evaluation includes B-mode imaging, spectral Doppler, color Doppler, ?and power Doppler as needed of all accessible portions of each vessel. Bilateral ?testing is considered an integral part of a complete examination. Limited ?examinations for reoccurring indications may be performed as noted. The reflux ?portion of the exam is performed with the patient in reverse Trendelenburg. ? ?  ? ?+-----+---------------+---------+-----------+----------+--------------+ ?RIGHTCompressibilityPhasicitySpontaneityPropertiesThrombus Aging ?+-----+---------------+---------+-----------+----------+--------------+ ?CFV  Full           Yes      Yes                                 ?+-----+---------------+---------+-----------+----------+--------------+ ? ?  ? ?  ? ?+---------+---------------+---------+-----------+----------+--------------+ ?LEFT     CompressibilityPhasicitySpontaneityPropertiesThrombus Aging ?+---------+---------------+---------+-----------+----------+--------------+ ?CFV      Full           Yes      Yes                                 ?+---------+---------------+---------+-----------+----------+--------------+ ?SFJ      Full           Yes      Yes                                 ?+---------+---------------+---------+-----------+----------+--------------+ ?FV Prox  Full           Yes      Yes                                 ?+---------+---------------+---------+-----------+----------+--------------+ ?FV Mid   Full           Yes      Yes                                 ?+---------+---------------+---------+-----------+----------+--------------+ ?FV DistalFull  Yes      Yes                                 ?+---------+---------------+---------+-----------+----------+--------------+ ?PFV      Full           Yes      Yes                                  ?+---------+---------------+---------+-----------+----------+--------------+ ?POP      Full           Yes      Yes                                 ?+---------+---------------+---------+-----------+----------+--------------+ ?PTV      Full           Yes      Yes                                 ?+---------+---------------+---------+-----------+----------+--------------+ ?PERO     Full           Yes      Yes                                 ?+---------+---------------+---------+-----------+----------+--------------+ ?Gastroc  Full           Yes      Yes                                 ?+---------+---------------+---------+-----------+----------+--------------+ ?GSV      Full           Yes      Yes                                 ?+---------+---------------+---------+-----------+----------+--------------+ ? ?Bifid distal femoral vein - both are patent and compressible. ?Imaging in the area of discomfort revealed a patent superficial vein and proximal peroneal veins. ? ?  ? ?Findings reported to Dr. Quay Burow via secure chat at 3:40PM. ?  ?Summary: ?RIGHT: ?- No evidence of common femoral vein obstruction. ?  ?LEFT: ?- No evidence of deep vein thrombosis in the lower extremity. No indirect evidence of obstruction proximal to the inguinal ligament. ?- There is no evidence of superficial venous thrombosis. ?  ?- No cystic structure found in the popliteal fossa. ?  ? ?*See table(s) above for measurements and observations. ? ?  ? ?  Preliminary   ? ? ? ? ? ? ?Assessment & Plan:  ? ? ? ? ? ?See Problem List for Assessment and Plan of chronic medical problems.  ? ? ? ? ?No DVT - possibly MSK in nature. Symptomatic treatment - if not getting better can refer to sports med ? ? ?

## 2021-06-04 ENCOUNTER — Other Ambulatory Visit: Payer: Self-pay

## 2021-06-04 ENCOUNTER — Ambulatory Visit (INDEPENDENT_AMBULATORY_CARE_PROVIDER_SITE_OTHER): Payer: PPO | Admitting: Internal Medicine

## 2021-06-04 ENCOUNTER — Ambulatory Visit (HOSPITAL_COMMUNITY)
Admission: RE | Admit: 2021-06-04 | Discharge: 2021-06-04 | Disposition: A | Discharge status: Home / Self Care | Payer: PPO | Source: Ambulatory Visit | Attending: Internal Medicine | Admitting: Internal Medicine

## 2021-06-04 ENCOUNTER — Encounter (HOSPITAL_COMMUNITY): Payer: Self-pay | Admitting: Cardiology

## 2021-06-04 VITALS — BP 146/60 | HR 89 | Temp 98.5°F | Ht 71.0 in | Wt 209.4 lb

## 2021-06-04 DIAGNOSIS — M79662 Pain in left lower leg: Secondary | ICD-10-CM

## 2021-06-04 NOTE — Patient Instructions (Signed)
? ? ?  An ultrasound of your left leg was ordered.  ?

## 2021-06-04 NOTE — Progress Notes (Signed)
Left venous duplex negative for DVT/SVT. ?

## 2021-06-04 NOTE — Progress Notes (Deleted)
.  negdvt ?

## 2021-06-04 NOTE — Assessment & Plan Note (Signed)
Acute ?Intermittent pain x 2-3 weeks ?No increased swelling-has chronic swelling in both legs and they are equal and stable ?Positive Homans' sign ?Concern for DVT given his history of lymphoma-just stopped chemotherapy ?Left lower extremity ultrasound today to rule out DVT ?Further evaluation/treatment ?Ultrasound results ?

## 2021-06-17 ENCOUNTER — Other Ambulatory Visit: Payer: Self-pay | Admitting: Internal Medicine

## 2021-07-16 DIAGNOSIS — N1832 Chronic kidney disease, stage 3b: Secondary | ICD-10-CM | POA: Diagnosis not present

## 2021-07-23 DIAGNOSIS — E875 Hyperkalemia: Secondary | ICD-10-CM | POA: Diagnosis not present

## 2021-07-23 DIAGNOSIS — N1831 Chronic kidney disease, stage 3a: Secondary | ICD-10-CM | POA: Diagnosis not present

## 2021-07-23 DIAGNOSIS — I129 Hypertensive chronic kidney disease with stage 1 through stage 4 chronic kidney disease, or unspecified chronic kidney disease: Secondary | ICD-10-CM | POA: Diagnosis not present

## 2021-07-23 DIAGNOSIS — R809 Proteinuria, unspecified: Secondary | ICD-10-CM | POA: Diagnosis not present

## 2021-07-23 DIAGNOSIS — C8580 Other specified types of non-Hodgkin lymphoma, unspecified site: Secondary | ICD-10-CM | POA: Diagnosis not present

## 2021-08-02 ENCOUNTER — Encounter: Payer: Self-pay | Admitting: Internal Medicine

## 2021-08-02 NOTE — Progress Notes (Signed)
? ? ?  Subjective:  ? ? Patient ID: [name] [name], male    DOB: [birth year], 71 y.o.   MRN: [id number] ? ? ? ? ? ?HPI ?Riggins is here for  ?Chief Complaint  ?Patient presents with  ? Herpes Zoster  ?  Left side of breast plate rash noted  ? ? ?Itching-for at least a few weeks, 1 month he has been itching on his anterior chest and abdomen.  He denies any major itching on his arms or legs.  This comes and goes.  He is not aware of any new products, foods or anything that could be causing it.  He denies any associated rash. ? ?2 days ago he noticed a rash just under his left breast.  It does itch.  He denies any pain.  A couple of days before he noticed this he was feeling more fatigued, had chills and felt achy all over like he was getting the flu.  Concerned about shingles. ? ? ? ? ? ?Medications and allergies reviewed with patient and updated if appropriate. ? ?Current Outpatient Medications on File Prior to Visit  ?Medication Sig Dispense Refill  ? furosemide (LASIX) 40 MG tablet Take 40 mg by mouth daily.    ? hydrALAZINE (APRESOLINE) 100 MG tablet TAKE 1 TABLET BY MOUTH THREE TIMES A DAY 270 tablet 1  ? levothyroxine (SYNTHROID) 150 MCG tablet TAKE 1 TABLET BY MOUTH EVERY DAY 90 tablet 1  ? Melatonin 10 MG SUBL Place 10 mg under the tongue as needed.    ? Multiple Vitamins-Minerals (CENTRUM SILVER 50+MEN PO) Take by mouth.    ? rosuvastatin (CRESTOR) 20 MG tablet TAKE 1 TABLET BY MOUTH EVERY DAY 90 tablet 1  ? ?No current facility-administered medications on file prior to visit.  ? ? ?Review of Systems ? ?   ?Objective:  ? ?Vitals:  ? 08/03/21 1039  ?BP: 140/70  ?Pulse: 73  ?Temp: 98.6 ?F (37 ?C)  ?SpO2: 97%  ? ?BP Readings from Last 3 Encounters:  ?08/03/21 140/70  ?06/04/21 (!) 146/60  ?05/21/21 (!) 158/79  ? ?Wt Readings from Last 3 Encounters:  ?08/03/21 208 lb 9.6 oz (94.6 kg)  ?06/04/21 209 lb 6.4 oz (95 kg)  ?05/21/21 210 lb (95.3 kg)  ? ?Body mass index is 29.09 kg/m?. ? ?  ?Physical  Exam ?Constitutional:   ?   General: He is not in acute distress. ?   Appearance: Normal appearance. He is not ill-appearing.  ?HENT:  ?   Head: Normocephalic and atraumatic.  ?Skin: ?   General: Skin is warm and dry.  ?   Findings: Rash (Under left breast there is an area of erythema with 2 sized blisters with irregular borders.  No other rash in this dermatome) present.  ?Neurological:  ?   Mental Status: He is alert.  ? ?   ? ? ? ? ? ?Assessment & Plan:  ? ? ?See Problem List for Assessment and Plan of chronic medical problems.  ? ? ? ? ?

## 2021-08-03 ENCOUNTER — Ambulatory Visit (INDEPENDENT_AMBULATORY_CARE_PROVIDER_SITE_OTHER): Payer: PPO | Admitting: Internal Medicine

## 2021-08-03 VITALS — BP 140/70 | HR 73 | Temp 98.6°F | Ht 71.0 in | Wt 208.6 lb

## 2021-08-03 DIAGNOSIS — B029 Zoster without complications: Secondary | ICD-10-CM | POA: Diagnosis not present

## 2021-08-03 DIAGNOSIS — L299 Pruritus, unspecified: Secondary | ICD-10-CM | POA: Diagnosis not present

## 2021-08-03 MED ORDER — VALACYCLOVIR HCL 1 G PO TABS: 1000.0000 mg | ORAL_TABLET | Freq: Three times a day (TID) | ORAL | 0 refills | Status: AC

## 2021-08-03 NOTE — Assessment & Plan Note (Signed)
Acute ?Rash under left breast is consistent with probable shingles ?No significant pain, which is likely secondary to him having had the vaccine ?Start Valtrex 1000 mg 3 times daily x1 week ?Advised Advil or Tylenol as needed for pain ?If pain does increase or he has any questions advised him to contact me ?

## 2021-08-03 NOTE — Patient Instructions (Addendum)
? ? ? ?  Medications changes include :    ? ?start claritin during the day or zyrtec at night for the itching - if this does not get better we can have you see an allergist.  ? ?Start valtrex three times a day for one week for probable shingles.   ? ? ?Your prescription(s) have been sent to your pharmacy.  ? ? ? ?Return if symptoms worsen or fail to improve. ? ?

## 2021-08-03 NOTE — Assessment & Plan Note (Signed)
Subacute ?Started a few weeks-1 month ago and is not associated with the rash ?Located on his anterior chest and abdomen-is intermittent ?No obvious cause ?Denies new products, new food or anything else that was different that may have caused an allergic reaction ?Likely allergic in nature ?Advised to start Claritin daily or Zyrtec nightly to see if that helps ?We will again review if there are any new products or any possible causes ?If itching persists can consider seeing an allergist ?

## 2021-08-23 ENCOUNTER — Encounter: Payer: Self-pay | Admitting: Hematology & Oncology

## 2021-08-23 ENCOUNTER — Inpatient Hospital Stay: Payer: PPO | Admitting: Hematology & Oncology

## 2021-08-23 ENCOUNTER — Inpatient Hospital Stay: Payer: PPO | Attending: Hematology & Oncology

## 2021-08-23 VITALS — BP 128/68 | HR 80 | Temp 98.2°F | Resp 20 | Ht 71.0 in | Wt 207.0 lb

## 2021-08-23 DIAGNOSIS — M7989 Other specified soft tissue disorders: Secondary | ICD-10-CM | POA: Insufficient documentation

## 2021-08-23 DIAGNOSIS — Z79899 Other long term (current) drug therapy: Secondary | ICD-10-CM | POA: Insufficient documentation

## 2021-08-23 DIAGNOSIS — N189 Chronic kidney disease, unspecified: Secondary | ICD-10-CM | POA: Diagnosis not present

## 2021-08-23 DIAGNOSIS — C884 Extranodal marginal zone B-cell lymphoma of mucosa-associated lymphoid tissue [MALT-lymphoma]: Secondary | ICD-10-CM | POA: Insufficient documentation

## 2021-08-23 DIAGNOSIS — R109 Unspecified abdominal pain: Secondary | ICD-10-CM | POA: Diagnosis not present

## 2021-08-23 DIAGNOSIS — R161 Splenomegaly, not elsewhere classified: Secondary | ICD-10-CM | POA: Insufficient documentation

## 2021-08-23 DIAGNOSIS — R110 Nausea: Secondary | ICD-10-CM | POA: Diagnosis not present

## 2021-08-23 DIAGNOSIS — C8583 Other specified types of non-Hodgkin lymphoma, intra-abdominal lymph nodes: Secondary | ICD-10-CM

## 2021-08-23 LAB — CMP (CANCER CENTER ONLY)
ALT: 18 U/L (ref 0–44)
AST: 21 U/L (ref 15–41)
Albumin: 4.4 g/dL (ref 3.5–5.0)
Alkaline Phosphatase: 51 U/L (ref 38–126)
Anion gap: 9 (ref 5–15)
BUN: 30 mg/dL — ABNORMAL HIGH (ref 8–23)
CO2: 26 mmol/L (ref 22–32)
Calcium: 10.1 mg/dL (ref 8.9–10.3)
Chloride: 103 mmol/L (ref 98–111)
Creatinine: 1.64 mg/dL — ABNORMAL HIGH (ref 0.61–1.24)
GFR, Estimated: 45 mL/min — ABNORMAL LOW (ref >60)
Glucose, Bld: 105 mg/dL — ABNORMAL HIGH (ref 70–99)
Potassium: 3.9 mmol/L (ref 3.5–5.1)
Sodium: 138 mmol/L (ref 135–145)
Total Bilirubin: 0.4 mg/dL (ref 0.3–1.2)
Total Protein: 7.9 g/dL (ref 6.5–8.1)

## 2021-08-23 LAB — CBC WITH DIFFERENTIAL (CANCER CENTER ONLY)
Abs Immature Granulocytes: 0.01 10*3/uL (ref 0.00–0.07)
Basophils Absolute: 0 10*3/uL (ref 0.0–0.1)
Basophils Relative: 0 %
Eosinophils Absolute: 0.1 10*3/uL (ref 0.0–0.5)
Eosinophils Relative: 2 %
HCT: 34.1 % — ABNORMAL LOW (ref 39.0–52.0)
Hemoglobin: 11.4 g/dL — ABNORMAL LOW (ref 13.0–17.0)
Immature Granulocytes: 0 %
Lymphocytes Relative: 37 %
Lymphs Abs: 1.7 10*3/uL (ref 0.7–4.0)
MCH: 31.9 pg (ref 26.0–34.0)
MCHC: 33.4 g/dL (ref 30.0–36.0)
MCV: 95.5 fL (ref 80.0–100.0)
Monocytes Absolute: 0.7 10*3/uL (ref 0.1–1.0)
Monocytes Relative: 16 %
Neutro Abs: 2.1 10*3/uL (ref 1.7–7.7)
Neutrophils Relative %: 45 %
Platelet Count: 126 10*3/uL — ABNORMAL LOW (ref 150–400)
RBC: 3.57 MIL/uL — ABNORMAL LOW (ref 4.22–5.81)
RDW: 13.7 % (ref 11.5–15.5)
WBC Count: 4.6 10*3/uL (ref 4.0–10.5)
nRBC: 0 % (ref 0.0–0.2)

## 2021-08-23 LAB — LACTATE DEHYDROGENASE: LDH: 137 U/L (ref 98–192)

## 2021-08-23 NOTE — Progress Notes (Signed)
Hematology and Oncology Follow Up Visit  [name] [name] [id number] 1950/06/06 71 y.o. 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 is back for follow-up.  He is doing pretty well.  He really has had no specific complaints.  He says he has had some low-grade temperatures.  He has had some sweats.  I am not sure exactly what this would imply.  He has been off the Imbruvica and the venetoclax now for about 2 to 3 months.  He said after he stopped these he felt a little bit worse.  Now he says he feels a little bit better.  He has had no change in bowel or bladder habits.  He has had no cough or shortness of breath.  He has had no rashes.  Henrene Pastor does have chronic renal insufficiency.  He is seeing nephrology for this.  He had little bit of swelling in the feet.  He has had no bleeding.  There is no dysphagia or odynophagia.  Overall, I would say his performance status is probably ECOG 1.    Medications:  Current Outpatient Medications:    furosemide (LASIX) 40 MG tablet, Take 40 mg by mouth daily as needed., Disp: , Rfl:    hydrALAZINE (APRESOLINE) 100 MG tablet, TAKE 1 TABLET BY MOUTH THREE TIMES A DAY, Disp: 270 tablet, Rfl: 1   levothyroxine (SYNTHROID) 150 MCG tablet, TAKE 1 TABLET BY MOUTH EVERY DAY, Disp: 90 tablet, Rfl: 1   Multiple Vitamins-Minerals (CENTRUM SILVER 50+MEN PO), Take by mouth., Disp: , Rfl:    rosuvastatin (CRESTOR) 20 MG tablet, TAKE 1 TABLET BY MOUTH EVERY DAY, Disp: 90 tablet, Rfl: 1   Melatonin 10 MG SUBL, Place 10 mg under the tongue as needed. (Patient not taking: Reported on 08/23/2021), Disp: , Rfl:   Allergies: No Known Allergies  Past Medical History, Surgical history, Social history, and Family  History were reviewed and updated.  Review of Systems: Review of Systems  Constitutional: Negative.   HENT:  Negative.    Eyes: Negative.   Respiratory: Negative.    Cardiovascular: Negative.   Gastrointestinal:  Positive for abdominal pain and nausea.  Endocrine: Negative.   Genitourinary: Negative.    Musculoskeletal: Negative.   Skin: Negative.   Neurological: Negative.   Hematological: Negative.   Psychiatric/Behavioral: Negative.     Physical Exam:  height is '5\' 11"'$  (1.803 m) and weight is 207 lb (93.9 kg). His oral temperature is 98.2 F (36.8 C). His blood pressure is 128/68 and his pulse is 80. His respiration is 20 and oxygen saturation is 97%.   Wt Readings from Last 3 Encounters:  08/23/21 207 lb (93.9 kg)  08/03/21 208 lb 9.6 oz (94.6 kg)  06/04/21 209 lb 6.4 oz (95 kg)    Physical Exam Vitals reviewed.  HENT:     Head: Normocephalic and atraumatic.  Eyes:     Pupils: Pupils are equal, round, and reactive to light.  Cardiovascular:     Rate and Rhythm: Normal rate and regular rhythm.     Heart sounds: Normal heart sounds.  Pulmonary:     Effort: Pulmonary effort is normal.     Breath sounds: Normal breath sounds.  Abdominal:     General: Bowel sounds are normal.     Palpations: Abdomen is soft.  Musculoskeletal:        General: No tenderness or deformity. Normal range of motion.     Cervical back: Normal range of motion.  Lymphadenopathy:     Cervical: No cervical adenopathy.  Skin:    General: Skin is warm and dry.     Findings: No erythema or rash.  Neurological:     Mental Status: He is alert and oriented to person, place, and time.  Psychiatric:        Behavior: Behavior normal.        Thought Content: Thought content normal.        Judgment: Judgment normal.    Lab Results  Component Value Date   WBC 4.6 08/23/2021   HGB 11.4 (L) 08/23/2021   HCT 34.1 (L) 08/23/2021   MCV 95.5 08/23/2021   PLT 126 (L) 08/23/2021     Chemistry       Component Value Date/Time   NA 140 05/21/2021 0819   NA 138 12/06/2019 1501   NA 142 04/25/2016 0844   NA 140 10/19/2015 0903   K 4.5 05/21/2021 0819   K 4.1 04/25/2016 0844   K 4.1 10/19/2015 0903   CL 104 05/21/2021 0819   CL 102 04/25/2016 0844   CO2 30 05/21/2021 0819   CO2 29 04/25/2016 0844   CO2 25 10/19/2015 0903   BUN 26 (H) 05/21/2021 0819   BUN 20 12/06/2019 1501   BUN 14 04/25/2016 0844   BUN 21.6 10/19/2015 0903   CREATININE 1.73 (H) 05/21/2021 0819   CREATININE 1.1 04/25/2016 0844   CREATININE 1.3 10/19/2015 0903      Component Value Date/Time   CALCIUM 9.3 05/21/2021 0819   CALCIUM 9.6 04/25/2016 0844   CALCIUM 9.4 10/19/2015 0903   ALKPHOS 48 05/21/2021 0819   ALKPHOS 59 04/25/2016 0844   ALKPHOS 68 10/19/2015 0903   AST 25 05/21/2021 0819   AST 21 10/19/2015 0903   ALT 16 05/21/2021 0819   ALT 27 04/25/2016 0844   ALT 19 10/19/2015 0903   BILITOT 0.7 05/21/2021 0819   BILITOT 0.54 10/19/2015 0903      Impression and Plan: [name] is a 70 year old white male.  We treated him 9 years ago.  He had a marginal zone lymphoma.  He went into clinical remission.  When he initially presented, he had splenomegaly.  When he relapsed, he again had splenomegaly.    We will continue him off treatment.  We will follow-up with a CT scan in about 3 months.  I think this would be reasonable.  Hopefully, we will see that he stays in remission for quite a while.  As always, his lymphocytes and white cell count will tell us how things are going.  I will check a testosterone level on him then He is here.  Is possible some of these symptoms that his had could be from low testosterone.  [name], MD 6/5/20239:37 AM

## 2021-08-24 LAB — KAPPA/LAMBDA LIGHT CHAINS
Kappa free light chain: 299.5 mg/L — ABNORMAL HIGH (ref 3.3–19.4)
Kappa, lambda light chain ratio: 23.58 — ABNORMAL HIGH (ref 0.26–1.65)
Lambda free light chains: 12.7 mg/L (ref 5.7–26.3)

## 2021-08-24 LAB — IGG, IGA, IGM
IgA: 124 mg/dL (ref 61–437)
IgG (Immunoglobin G), Serum: 642 mg/dL (ref 603–1613)
IgM (Immunoglobulin M), Srm: 1737 mg/dL — ABNORMAL HIGH (ref 20–172)

## 2021-08-24 LAB — BETA 2 MICROGLOBULIN, SERUM: Beta-2 Microglobulin: 2.4 mg/L (ref 0.6–2.4)

## 2021-08-27 LAB — IMMUNOFIXATION REFLEX, SERUM
IgA: 133 mg/dL (ref 61–437)
IgG (Immunoglobin G), Serum: 693 mg/dL (ref 603–1613)
IgM (Immunoglobulin M), Srm: 1918 mg/dL — ABNORMAL HIGH (ref 20–172)

## 2021-08-27 LAB — PROTEIN ELECTROPHORESIS, SERUM, WITH REFLEX
A/G Ratio: 1 (ref 0.7–1.7)
Albumin ELP: 3.7 g/dL (ref 2.9–4.4)
Alpha-1-Globulin: 0.2 g/dL (ref 0.0–0.4)
Alpha-2-Globulin: 0.9 g/dL (ref 0.4–1.0)
Beta Globulin: 0.9 g/dL (ref 0.7–1.3)
Gamma Globulin: 1.8 g/dL (ref 0.4–1.8)
Globulin, Total: 3.8 g/dL (ref 2.2–3.9)
M-Spike, %: 1.3 g/dL — ABNORMAL HIGH
SPEP Interpretation: 0
Total Protein ELP: 7.5 g/dL (ref 6.0–8.5)

## 2021-09-09 DIAGNOSIS — D3131 Benign neoplasm of right choroid: Secondary | ICD-10-CM | POA: Diagnosis not present

## 2021-10-28 DIAGNOSIS — L570 Actinic keratosis: Secondary | ICD-10-CM | POA: Diagnosis not present

## 2021-10-28 DIAGNOSIS — L821 Other seborrheic keratosis: Secondary | ICD-10-CM | POA: Diagnosis not present

## 2021-10-28 DIAGNOSIS — Z86006 Personal history of melanoma in-situ: Secondary | ICD-10-CM | POA: Diagnosis not present

## 2021-10-28 DIAGNOSIS — Z08 Encounter for follow-up examination after completed treatment for malignant neoplasm: Secondary | ICD-10-CM | POA: Diagnosis not present

## 2021-10-28 DIAGNOSIS — Z1283 Encounter for screening for malignant neoplasm of skin: Secondary | ICD-10-CM | POA: Diagnosis not present

## 2021-10-28 DIAGNOSIS — X32XXXD Exposure to sunlight, subsequent encounter: Secondary | ICD-10-CM | POA: Diagnosis not present

## 2021-11-04 DIAGNOSIS — M25561 Pain in right knee: Secondary | ICD-10-CM | POA: Diagnosis not present

## 2021-11-22 ENCOUNTER — Other Ambulatory Visit: Payer: Self-pay | Admitting: Internal Medicine

## 2021-11-23 ENCOUNTER — Other Ambulatory Visit: Payer: Self-pay | Admitting: Cardiology

## 2021-11-24 ENCOUNTER — Encounter: Payer: Self-pay | Admitting: Hematology & Oncology

## 2021-11-24 ENCOUNTER — Inpatient Hospital Stay: Payer: PPO | Admitting: Hematology & Oncology

## 2021-11-24 ENCOUNTER — Other Ambulatory Visit: Payer: Self-pay

## 2021-11-24 ENCOUNTER — Ambulatory Visit (HOSPITAL_BASED_OUTPATIENT_CLINIC_OR_DEPARTMENT_OTHER)
Admission: RE | Admit: 2021-11-24 | Discharge: 2021-11-24 | Disposition: A | Discharge status: Home / Self Care | Payer: PPO | Source: Ambulatory Visit | Attending: Hematology & Oncology | Admitting: Hematology & Oncology

## 2021-11-24 ENCOUNTER — Inpatient Hospital Stay: Payer: PPO | Attending: Hematology & Oncology

## 2021-11-24 ENCOUNTER — Encounter (HOSPITAL_BASED_OUTPATIENT_CLINIC_OR_DEPARTMENT_OTHER): Payer: Self-pay

## 2021-11-24 VITALS — BP 136/73 | HR 69 | Temp 97.6°F | Resp 18 | Wt 215.0 lb

## 2021-11-24 DIAGNOSIS — C8583 Other specified types of non-Hodgkin lymphoma, intra-abdominal lymph nodes: Secondary | ICD-10-CM | POA: Insufficient documentation

## 2021-11-24 DIAGNOSIS — K449 Diaphragmatic hernia without obstruction or gangrene: Secondary | ICD-10-CM | POA: Diagnosis not present

## 2021-11-24 DIAGNOSIS — Z79899 Other long term (current) drug therapy: Secondary | ICD-10-CM | POA: Diagnosis not present

## 2021-11-24 DIAGNOSIS — I712 Thoracic aortic aneurysm, without rupture, unspecified: Secondary | ICD-10-CM | POA: Diagnosis not present

## 2021-11-24 DIAGNOSIS — I700 Atherosclerosis of aorta: Secondary | ICD-10-CM | POA: Diagnosis not present

## 2021-11-24 DIAGNOSIS — C884 Extranodal marginal zone B-cell lymphoma of mucosa-associated lymphoid tissue [MALT-lymphoma]: Secondary | ICD-10-CM | POA: Diagnosis not present

## 2021-11-24 DIAGNOSIS — K5790 Diverticulosis of intestine, part unspecified, without perforation or abscess without bleeding: Secondary | ICD-10-CM | POA: Diagnosis not present

## 2021-11-24 LAB — CMP (CANCER CENTER ONLY)
ALT: 18 U/L (ref 0–44)
AST: 21 U/L (ref 15–41)
Albumin: 4.4 g/dL (ref 3.5–5.0)
Alkaline Phosphatase: 45 U/L (ref 38–126)
Anion gap: 8 (ref 5–15)
BUN: 32 mg/dL — ABNORMAL HIGH (ref 8–23)
CO2: 27 mmol/L (ref 22–32)
Calcium: 10.3 mg/dL (ref 8.9–10.3)
Chloride: 103 mmol/L (ref 98–111)
Creatinine: 1.59 mg/dL — ABNORMAL HIGH (ref 0.61–1.24)
GFR, Estimated: 47 mL/min — ABNORMAL LOW (ref >60)
Glucose, Bld: 93 mg/dL (ref 70–99)
Potassium: 4.3 mmol/L (ref 3.5–5.1)
Sodium: 138 mmol/L (ref 135–145)
Total Bilirubin: 0.3 mg/dL (ref 0.3–1.2)
Total Protein: 8.3 g/dL — ABNORMAL HIGH (ref 6.5–8.1)

## 2021-11-24 LAB — CBC WITH DIFFERENTIAL (CANCER CENTER ONLY)
Abs Immature Granulocytes: 0.01 10*3/uL (ref 0.00–0.07)
Basophils Absolute: 0.1 10*3/uL (ref 0.0–0.1)
Basophils Relative: 1 %
Eosinophils Absolute: 0.1 10*3/uL (ref 0.0–0.5)
Eosinophils Relative: 1 %
HCT: 34.3 % — ABNORMAL LOW (ref 39.0–52.0)
Hemoglobin: 11.1 g/dL — ABNORMAL LOW (ref 13.0–17.0)
Immature Granulocytes: 0 %
Lymphocytes Relative: 37 %
Lymphs Abs: 1.7 10*3/uL (ref 0.7–4.0)
MCH: 31.4 pg (ref 26.0–34.0)
MCHC: 32.4 g/dL (ref 30.0–36.0)
MCV: 97.2 fL (ref 80.0–100.0)
Monocytes Absolute: 0.6 10*3/uL (ref 0.1–1.0)
Monocytes Relative: 13 %
Neutro Abs: 2.3 10*3/uL (ref 1.7–7.7)
Neutrophils Relative %: 48 %
Platelet Count: 148 10*3/uL — ABNORMAL LOW (ref 150–400)
RBC: 3.53 MIL/uL — ABNORMAL LOW (ref 4.22–5.81)
RDW: 12.5 % (ref 11.5–15.5)
WBC Count: 4.7 10*3/uL (ref 4.0–10.5)
nRBC: 0 % (ref 0.0–0.2)

## 2021-11-24 LAB — LACTATE DEHYDROGENASE: LDH: 142 U/L (ref 98–192)

## 2021-11-24 MED ORDER — IOHEXOL 300 MG/ML  SOLN
100.0000 mL | Freq: Once | INTRAMUSCULAR | Status: AC | PRN
  Administered 2021-11-24: 100 mL via INTRAVENOUS

## 2021-11-24 NOTE — Addendum Note (Signed)
Addended by: Burney Gauze R on: 11/24/2021 03:32 PM   Modules accepted: Orders

## 2021-11-24 NOTE — Progress Notes (Signed)
Hematology and Oncology Follow Up Visit  [name] [name] [id number] Jun 01, 1950 71 y.o. 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  He is doing pretty well.  He really has had no specific complaints since we last saw him.  Last saw him about 3 months ago.  The 1 thing that I noticed that he did have a elevated IgM level.  This might be similar going to have to watch.  If not we last saw him, his IgM level was 1900 mg/dL.  The Kappa light chain was 30 mg/dL.  He had a CT scan that was done today.  Thankfully, the CT scan did not show any obvious splenomegaly.  There is no lymphadenopathy.  I suppose that it might be possible that he may have a Waldenstrom's macroglobulinemia.  As such, we may have to consider a bone marrow biopsy for him.  He does have the chronic renal insufficiency.  He is being followed by Nephrology for this.  He has little bit of swelling in the feet.  He is on Lasix.  He says he does have some numbness in his feet.  This is intermittent.  He has had no weakness.  He has had no rashes.  There is been no change in bowel or bladder habits.  He has had no cough or shortness of breath.  Thankfully, there is been no issues with COVID.  Currently, I would say his performance status is probably ECOG 0.    Medications:  Current Outpatient Medications:    furosemide (LASIX) 40 MG tablet, Take 40 mg by mouth daily as needed., Disp: , Rfl:    hydrALAZINE (APRESOLINE) 100 MG tablet, TAKE 1 TABLET BY MOUTH THREE TIMES A DAY, Disp: 270 tablet, Rfl: 1   levothyroxine (SYNTHROID) 150 MCG tablet, TAKE 1 TABLET BY MOUTH EVERY DAY, Disp: 90 tablet, Rfl: 1   Melatonin 10 MG SUBL, Place 10 mg under the  tongue as needed. (Patient not taking: Reported on 08/23/2021), Disp: , Rfl:    Multiple Vitamins-Minerals (CENTRUM SILVER 50+MEN PO), Take by mouth., Disp: , Rfl:    rosuvastatin (CRESTOR) 20 MG tablet, TAKE 1 TABLET BY MOUTH EVERY DAY, Disp: 90 tablet, Rfl: 1  Allergies: No Known Allergies  Past Medical History, Surgical history, Social history, and Family History were reviewed and updated.  Review of Systems: Review of Systems  Constitutional: Negative.   HENT:  Negative.    Eyes: Negative.   Respiratory: Negative.    Cardiovascular: Negative.   Gastrointestinal:  Positive for abdominal pain and nausea.  Endocrine: Negative.   Genitourinary: Negative.    Musculoskeletal: Negative.   Skin: Negative.   Neurological: Negative.   Hematological: Negative.   Psychiatric/Behavioral: Negative.      Physical Exam:  weight is 215 lb (97.5 kg). His oral temperature is 97.6 F (36.4 C). His blood pressure is 136/73 and his pulse is 69. His respiration is 18 and oxygen saturation is 100%.   Wt Readings from Last 3 Encounters:  11/24/21 215 lb (97.5 kg)  08/23/21 207 lb (93.9 kg)  08/03/21 208 lb 9.6 oz (94.6 kg)    Physical Exam Vitals reviewed.  HENT:  Head: Normocephalic and atraumatic.  Eyes:     Pupils: Pupils are equal, round, and reactive to light.  Cardiovascular:     Rate and Rhythm: Normal rate and regular rhythm.     Heart sounds: Normal heart sounds.  Pulmonary:     Effort: Pulmonary effort is normal.     Breath sounds: Normal breath sounds.  Abdominal:     General: Bowel sounds are normal.     Palpations: Abdomen is soft.  Musculoskeletal:        General: No tenderness or deformity. Normal range of motion.     Cervical back: Normal range of motion.  Lymphadenopathy:     Cervical: No cervical adenopathy.  Skin:    General: Skin is warm and dry.     Findings: No erythema or rash.  Neurological:     Mental Status: He is alert and oriented to person, place,  and time.  Psychiatric:        Behavior: Behavior normal.        Thought Content: Thought content normal.        Judgment: Judgment normal.    Lab Results  Component Value Date   WBC 4.7 11/24/2021   HGB 11.1 (L) 11/24/2021   HCT 34.3 (L) 11/24/2021   MCV 97.2 11/24/2021   PLT 148 (L) 11/24/2021     Chemistry      Component Value Date/Time   NA 138 11/24/2021 0859   NA 138 12/06/2019 1501   NA 142 04/25/2016 0844   NA 140 10/19/2015 0903   K 4.3 11/24/2021 0859   K 4.1 04/25/2016 0844   K 4.1 10/19/2015 0903   CL 103 11/24/2021 0859   CL 102 04/25/2016 0844   CO2 27 11/24/2021 0859   CO2 29 04/25/2016 0844   CO2 25 10/19/2015 0903   BUN 32 (H) 11/24/2021 0859   BUN 20 12/06/2019 1501   BUN 14 04/25/2016 0844   BUN 21.6 10/19/2015 0903   CREATININE 1.59 (H) 11/24/2021 0859   CREATININE 1.1 04/25/2016 0844   CREATININE 1.3 10/19/2015 0903      Component Value Date/Time   CALCIUM 10.3 11/24/2021 0859   CALCIUM 9.6 04/25/2016 0844   CALCIUM 9.4 10/19/2015 0903   ALKPHOS 45 11/24/2021 0859   ALKPHOS 59 04/25/2016 0844   ALKPHOS 68 10/19/2015 0903   AST 21 11/24/2021 0859   AST 21 10/19/2015 0903   ALT 18 11/24/2021 0859   ALT 27 04/25/2016 0844   ALT 19 10/19/2015 0903   BILITOT 0.3 11/24/2021 0859   BILITOT 0.54 10/19/2015 0903      Impression and Plan: [name] [name] is a 71 year old white male.  We treated him 9 years ago.  He had a marginal zone lymphoma.  He went into clinical remission.  When he initially presented, he had splenomegaly.  When he relapsed, he again had splenomegaly.  We subsequently treated him with Imbruvica and venetoclax.  He completed his treatments back in April.  Again, I think the IgM and kappa light chain will be critical.  We will have to see what the numbers look like.  If they are still elevated, I think we will going to have to think about doing a bone marrow biopsy on him.  Not sure what to make of this numbness that he has in  his feet.  He really has nothing on his hands.  He said maybe in the right hand he may have occasional numbness.  I am  not sure when we need to get him back.  I will have to see what the protein levels look like and then we will decide when to get him back for follow-up.   [name], MD 9/6/202310:43 AM

## 2021-11-25 LAB — TESTOSTERONE: Testosterone: 319 ng/dL (ref 264–916)

## 2021-11-25 LAB — KAPPA/LAMBDA LIGHT CHAINS
Kappa free light chain: 271.9 mg/L — ABNORMAL HIGH (ref 3.3–19.4)
Kappa, lambda light chain ratio: 24.06 — ABNORMAL HIGH (ref 0.26–1.65)
Lambda free light chains: 11.3 mg/L (ref 5.7–26.3)

## 2021-11-25 LAB — BETA 2 MICROGLOBULIN, SERUM: Beta-2 Microglobulin: 2.9 mg/L — ABNORMAL HIGH (ref 0.6–2.4)

## 2021-11-26 LAB — IGG, IGA, IGM
IgA: 121 mg/dL (ref 61–437)
IgG (Immunoglobin G), Serum: 691 mg/dL (ref 603–1613)
IgM (Immunoglobulin M), Srm: 2151 mg/dL — ABNORMAL HIGH (ref 20–172)

## 2021-11-29 ENCOUNTER — Encounter: Payer: Self-pay | Admitting: Hematology & Oncology

## 2021-11-29 ENCOUNTER — Other Ambulatory Visit: Payer: Self-pay | Admitting: Hematology & Oncology

## 2021-11-29 DIAGNOSIS — C880 Waldenstrom macroglobulinemia: Secondary | ICD-10-CM

## 2021-11-30 NOTE — Progress Notes (Unsigned)
Per [name] pt ok to take his Lasix  From: Andera Cranmer '@[email or web address]> Sent: Tuesday, November 30, 2021 9:07 AM To: #Centralized Scheduling for Imaging Services (System-wide) '@[email or web address]> Subject: [id number] [name] [name]     I paged [name] for info on Lasix for BM BX.  just need ok for patient to take this med or hold?

## 2021-12-06 DIAGNOSIS — N1831 Chronic kidney disease, stage 3a: Secondary | ICD-10-CM | POA: Diagnosis not present

## 2021-12-13 ENCOUNTER — Ambulatory Visit: Payer: PPO

## 2021-12-15 ENCOUNTER — Ambulatory Visit
Admission: EM | Admit: 2021-12-15 | Discharge: 2021-12-15 | Disposition: A | Discharge status: Home / Self Care | Payer: PPO | Attending: Physician Assistant | Admitting: Physician Assistant

## 2021-12-15 ENCOUNTER — Telehealth: Payer: Self-pay | Admitting: *Deleted

## 2021-12-15 ENCOUNTER — Encounter: Payer: Self-pay | Admitting: Internal Medicine

## 2021-12-15 ENCOUNTER — Other Ambulatory Visit: Payer: Self-pay | Admitting: Radiology

## 2021-12-15 ENCOUNTER — Encounter: Payer: Self-pay | Admitting: Hematology & Oncology

## 2021-12-15 DIAGNOSIS — Z1152 Encounter for screening for COVID-19: Secondary | ICD-10-CM

## 2021-12-15 DIAGNOSIS — H1031 Unspecified acute conjunctivitis, right eye: Secondary | ICD-10-CM | POA: Insufficient documentation

## 2021-12-15 DIAGNOSIS — C880 Waldenstrom macroglobulinemia: Secondary | ICD-10-CM

## 2021-12-15 LAB — POCT INFLUENZA A/B
Influenza A, POC: NEGATIVE
Influenza B, POC: NEGATIVE

## 2021-12-15 MED ORDER — POLYMYXIN B-TRIMETHOPRIM 10000-0.1 UNIT/ML-% OP SOLN: 1.0000 [drp] | OPHTHALMIC | 0 refills | Status: AC

## 2021-12-15 NOTE — ED Provider Notes (Signed)
[facility]    CSN: [id number] Arrival date & time: 12/15/21  1247      History   Chief Complaint Chief Complaint  Patient presents with   Fever    I have bone marrow biopsy tomorrow and need to rule out the flu.  Need flu test. - Entered by patient    HPI [name] [name] is a 71 y.o. male.   Patient here today with wife for evaluation of fever that started a few days ago.  He has had some cough as well as fatigue.  He has a bone or biopsy tomorrow and is concerned he may have viral illness.  They did take COVID test at home that was negative.  He denies any nausea, vomiting or diarrhea.  He does note some redness to his right eye that has developed as well.  He has had drainage from same.  The history is provided by the patient.  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 ([place]) 01/27/2011   Thyroid disease 2007   hypothyroidism    Patient Active Problem List   Diagnosis Date Noted   Pruritus 08/03/2021   Herpes zoster without complication 80/88/1103   Pain of left calf 06/04/2021   COVID-19 04/06/2020   Hypertension 08/09/2019   Leukocytosis 04/21/2019   Melanoma of skin ([place]) 07/20/2018   Numbness and tingling in right hand 03/20/2017   Hyperglycemia 11/28/2014   Tubular adenoma of colon 04/08/2013   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 Authorizing Provider  trimethoprim-polymyxin b (POLYTRIM) ophthalmic solution Place 1 drop into the right eye every 4 (four) hours for 7 days. 12/15/21 12/22/21 Yes Francene Finders, PA-C  furosemide (LASIX) 40 MG tablet Take 40 mg by mouth daily as needed. 05/04/20   [provider]  hydrALAZINE (APRESOLINE) 100 MG tablet TAKE 1 TABLET BY MOUTH THREE TIMES A DAY 11/23/21   Martinique, Peter M, MD  levothyroxine (SYNTHROID) 150 MCG tablet TAKE 1 TABLET BY MOUTH EVERY DAY 06/17/21   Binnie Rail, MD  Melatonin 10 MG SUBL Place 10 mg under the tongue as needed. Patient not taking: Reported on 08/23/2021    [provider]  Multiple Vitamins-Minerals (CENTRUM SILVER 50+MEN PO) Take by mouth.    [provider]  rosuvastatin (CRESTOR) 20 MG tablet TAKE 1 TABLET BY MOUTH EVERY DAY 11/23/21   Binnie Rail, MD    Family History Family History  Problem Relation Age of Onset   Alzheimer's disease Mother    Heart attack Father 81   Diabetes Brother    COPD Brother    Dementia Brother    Stroke Brother    Hypertension Brother  X 3   Heart disease Paternal Grandfather        in 33s   Coronary artery disease Brother        CBAG/vavlve replacement   Cancer Neg Hx    Colon cancer Neg Hx    Colon polyps Neg Hx    Rectal cancer Neg Hx    Stomach cancer Neg Hx    Esophageal cancer Neg Hx     Social History Social History   Tobacco Use   Smoking status: Never   Smokeless tobacco: Never   Tobacco comments:    NEVER USED TOBACCO  Vaping Use   Vaping Use: Never used  Substance Use Topics   Alcohol use: Yes    Alcohol/week: 0.0 standard drinks of alcohol    Comment:  1-2 wine / week   Drug use: No     Allergies   Patient has no known allergies.   Review of Systems Review of Systems  Constitutional:  Positive for fatigue and fever.  HENT:  Negative for congestion, ear pain and sore throat.    Eyes:  Positive for discharge and redness.  Respiratory:  Positive for cough. Negative for shortness of breath.   Gastrointestinal:  Negative for abdominal pain, nausea and vomiting.     Physical Exam Triage Vital Signs ED Triage Vitals [12/15/21 1321]  Enc Vitals Group     BP 121/70     Pulse Rate 83     Resp 16     Temp 98.4 F (36.9 C)     Temp Source Oral     SpO2 96 %     Weight      Height      Head Circumference      Peak Flow      Pain Score 0     Pain Loc      Pain Edu?      Excl. in [place]?    No data found.  Updated Vital Signs BP 121/70 (BP Location: Left Arm)   Pulse 83   Temp 98.4 F (36.9 C) (Oral)   Resp 16   SpO2 96%     Physical Exam Vitals and nursing note reviewed.  Constitutional:      General: He is not in acute distress.    Appearance: Normal appearance. He is not ill-appearing.  HENT:     Head: Normocephalic and atraumatic.     Nose: Nose normal. No congestion or rhinorrhea.  Eyes:     Comments: Right conjunctiva injected, left conjunctiva normal  Cardiovascular:     Rate and Rhythm: Normal rate.  Pulmonary:     Effort: Pulmonary effort is normal. No respiratory distress.  Skin:    General: Skin is warm and dry.  Neurological:     Mental Status: He is alert.  Psychiatric:        Mood and Affect: Mood normal.        Thought Content: Thought content normal.      UC Treatments / Results  Labs (all labs ordered are listed, but only abnormal results are displayed) Labs Reviewed  SARS CORONAVIRUS 2 (TAT 6-24 HRS)  POCT INFLUENZA A/B    EKG   Radiology No results found.  Procedures Procedures (including critical care time)  Medications Ordered in UC Medications - No data to display  Initial Impression / Assessment and Plan / UC Course  I have reviewed the triage vital signs and the nursing notes.  Pertinent labs & imaging results that  were available during my care of the patient were reviewed by me and considered in my  medical decision making (see chart for details).    Rapid flu test negative in office.  Will order COVID screening through PCR given reported procedure tomorrow.  Will await results for further recommendation.  Antibiotic drops prescribed for conjunctivitis treatment.  Encouraged follow-up with any further concerns.  Final Clinical Impressions(s) / UC Diagnoses   Final diagnoses:  Encounter for screening for COVID-19  Acute conjunctivitis of right eye, unspecified acute conjunctivitis type   Discharge Instructions   None    ED Prescriptions     Medication Sig Dispense Auth. Provider   trimethoprim-polymyxin b (POLYTRIM) ophthalmic solution Place 1 drop into the right eye every 4 (four) hours for 7 days. 10 mL Francene Finders, PA-C      PDMP not reviewed this encounter.   Francene Finders, PA-C 12/15/21 1436

## 2021-12-15 NOTE — Telephone Encounter (Signed)
Message received from patient's wife, Mechele Claude stating that pt has had a fever since Saturday and would like to know if he should cancel his BM biopsy appt for tomorrow.  Call placed back to [facility] and she states that pt also has redness to his right eye, tested negative for covid this morning at home, has a NP cough, no SOB, no rash and no mouth sores. Dr. Marin Olp notified and would like for pt to go to an urgent care to be tested for the flu.  Mechele Claude states that she will contact pt.'s PCP to see if he is able to get in to be checked now d/t she would rather not go to an urgent care at this time.  Dr. Marin Olp notified.

## 2021-12-15 NOTE — ED Triage Notes (Addendum)
Pt c/o fever (99.5- 101F) and fatigue.  Started: Saturday  Pt c/o right eye irritation.  Home interventions: tylenol    At home covid test this morning was negative.

## 2021-12-16 ENCOUNTER — Encounter: Payer: Self-pay | Admitting: Internal Medicine

## 2021-12-16 ENCOUNTER — Ambulatory Visit (INDEPENDENT_AMBULATORY_CARE_PROVIDER_SITE_OTHER): Payer: PPO | Admitting: Internal Medicine

## 2021-12-16 ENCOUNTER — Ambulatory Visit: Payer: PPO | Admitting: Family Medicine

## 2021-12-16 ENCOUNTER — Ambulatory Visit (HOSPITAL_COMMUNITY)
Admission: RE | Admit: 2021-12-16 | Discharge: 2021-12-16 | Disposition: A | Discharge status: Home / Self Care | Payer: PPO | Source: Ambulatory Visit | Attending: Hematology & Oncology | Admitting: Hematology & Oncology

## 2021-12-16 DIAGNOSIS — C880 Waldenstrom macroglobulinemia: Secondary | ICD-10-CM

## 2021-12-16 DIAGNOSIS — J069 Acute upper respiratory infection, unspecified: Secondary | ICD-10-CM

## 2021-12-16 DIAGNOSIS — I159 Secondary hypertension, unspecified: Secondary | ICD-10-CM | POA: Diagnosis not present

## 2021-12-16 LAB — SARS CORONAVIRUS 2 (TAT 6-24 HRS): SARS Coronavirus 2: NEGATIVE

## 2021-12-16 MED ORDER — HYDROCOD POLI-CHLORPHE POLI ER 10-8 MG/5ML PO SUER: 5.0000 mL | Freq: Two times a day (BID) | ORAL | 0 refills | Status: DC | PRN

## 2021-12-16 MED ORDER — SODIUM CHLORIDE 0.9 % IV SOLN: INTRAVENOUS | Status: DC

## 2021-12-16 NOTE — Progress Notes (Signed)
Patient ID: [name] [name], male   DOB: 11-Jul-1950, 72 y.o.   MRN: [id number] Pt was originally scheduled for CT guided bone marrow biopsy today, however he continues to not feel well with some low grade temp elevations, cough, bilat conjuctivitis(on antbx eye drops). Recent COVID/flu test neg. Case d/w Drs. Wagner/Ennever. Decision made to hold on bx today in light of pt's current clinical state and reschedule bx in a week to 10 days. Pt aware.

## 2021-12-16 NOTE — Assessment & Plan Note (Signed)
Acute Symptoms likely viral in nature COVID and flu test negative at urgent care yesterday Discussed his symptoms are viral and an antibiotic would not help Continue symptomatic treatment with over-the-counter cold medications, Tylenol/ibuprofen We will prescribe Tussionex cough syrup so that he can get some sleep at night, which will hopefully help him feel better sooner Increase rest and fluids Call if symptoms worsen or do not improve

## 2021-12-16 NOTE — Assessment & Plan Note (Signed)
Chronic Blood pressure slightly elevated here today, but he is sick and under significant stress No change in medication today, but should monitor BP Continue hydralazine 100 mg 3 times daily

## 2021-12-16 NOTE — Progress Notes (Signed)
Subjective:    Patient ID: [name] [name], male    DOB: [birth year], 71 y.o.   MRN: [id number]      HPI [name] [name] is here for No chief complaint on file.   9/27 urgent care - flu and covid negative.  Few days of fever, cough and fatigue.  He also had right eye redness and discharge - he was prescribed an antibacterial eye drop.   Saturday started having fever, fatigued, cough.  Temp 101.3 this morning. Scrtachy thorat, tickle in throat, PND, cough   Taking tylenol.  Not sleeping   Medications and allergies reviewed with patient and updated if appropriate.  Current Outpatient Medications on File Prior to Visit  Medication Sig Dispense Refill   furosemide (LASIX) 40 MG tablet Take 40 mg by mouth daily as needed.     hydrALAZINE (APRESOLINE) 100 MG tablet TAKE 1 TABLET BY MOUTH THREE TIMES A DAY 270 tablet 1   levothyroxine (SYNTHROID) 150 MCG tablet TAKE 1 TABLET BY MOUTH EVERY DAY 90 tablet 1   Melatonin 10 MG SUBL Place 10 mg under the tongue as needed.     Multiple Vitamins-Minerals (CENTRUM SILVER 50+MEN PO) Take by mouth.     rosuvastatin (CRESTOR) 20 MG tablet TAKE 1 TABLET BY MOUTH EVERY DAY 90 tablet 1   trimethoprim-polymyxin b (POLYTRIM) ophthalmic solution Place 1 drop into the right eye every 4 (four) hours for 7 days. 10 mL 0   Current Facility-Administered Medications on File Prior to Visit  Medication Dose Route Frequency Provider Last Rate Last Admin   0.9 %  sodium chloride infusion   Intravenous Continuous Allred, Darrell K, PA-C        Review of Systems  Constitutional:  Positive for appetite change, fatigue and fever.  HENT:  Positive for postnasal drip, rhinorrhea and sore throat (scratchy throat). Negative for congestion, ear pain, sinus pressure and sinus pain.   Eyes:  Positive for redness.  Respiratory:  Positive for cough, shortness of breath (mild) and wheezing (mild).   Gastrointestinal:  Negative for diarrhea.  Neurological:  Positive for  light-headedness. Negative for dizziness and headaches.       Objective:   Vitals:   12/16/21 1120 12/16/21 1123  BP: (!) 142/84 (!) 142/82  Pulse: 90   Temp: 99.7 F (37.6 C)   SpO2: 97%    BP Readings from Last 3 Encounters:  12/16/21 (!) 142/82  12/16/21 (!) 151/73  12/15/21 121/70   Wt Readings from Last 3 Encounters:  12/16/21 207 lb (93.9 kg)  11/24/21 215 lb (97.5 kg)  08/23/21 207 lb (93.9 kg)   Body mass index is 28.87 kg/m.    Physical Exam Constitutional:      General: He is not in acute distress.    Appearance: Normal appearance. He is not ill-appearing.  HENT:     Head: Normocephalic.     Right Ear: Tympanic membrane, ear canal and external ear normal. There is no impacted cerumen.     Left Ear: Tympanic membrane, ear canal and external ear normal. There is no impacted cerumen.     Mouth/Throat:     Mouth: Mucous membranes are moist.     Pharynx: No oropharyngeal exudate or posterior oropharyngeal erythema.  Eyes:     Conjunctiva/sclera: Conjunctivae normal.  Cardiovascular:     Rate and Rhythm: Normal rate and regular rhythm.  Pulmonary:     Effort: Pulmonary effort is normal. No respiratory distress.     Breath  sounds: Normal breath sounds. No wheezing or rales.  Musculoskeletal:     Cervical back: Neck supple. No tenderness.  Lymphadenopathy:     Cervical: No cervical adenopathy.  Skin:    General: Skin is warm and dry.     Findings: No rash.  Neurological:     Mental Status: He is alert.            Assessment & Plan:    See Problem List for Assessment and Plan of chronic medical problems.

## 2021-12-16 NOTE — Patient Instructions (Addendum)
     Medications changes include :  tussionex cough syrup     Your prescription(s) have been sent to your pharmacy.    Return if symptoms worsen or fail to improve.

## 2021-12-17 ENCOUNTER — Ambulatory Visit: Payer: PPO | Admitting: Internal Medicine

## 2021-12-20 ENCOUNTER — Encounter: Payer: Self-pay | Admitting: Internal Medicine

## 2021-12-21 MED ORDER — CEFDINIR 300 MG PO CAPS: 300.0000 mg | ORAL_CAPSULE | Freq: Two times a day (BID) | ORAL | 0 refills | Status: DC

## 2021-12-21 MED ORDER — AZITHROMYCIN 250 MG PO TABS: ORAL_TABLET | ORAL | 0 refills | Status: DC

## 2021-12-30 ENCOUNTER — Other Ambulatory Visit: Payer: Self-pay | Admitting: Internal Medicine

## 2022-01-03 ENCOUNTER — Other Ambulatory Visit: Payer: Self-pay | Admitting: Physician Assistant

## 2022-01-03 ENCOUNTER — Encounter: Payer: Self-pay | Admitting: Cardiology

## 2022-01-03 DIAGNOSIS — D72829 Elevated white blood cell count, unspecified: Secondary | ICD-10-CM

## 2022-01-03 NOTE — Telephone Encounter (Signed)
Spoke to patient 1 year follow up appointment scheduled with Dr.Jordan 2/5 at 9:40 am.

## 2022-01-03 NOTE — H&P (Signed)
Chief Complaint: Elevated Kappa Free Light chain  Referring Physician(s): Ennever  Supervising Physician: Aletta Edouard  Patient Status: [facility] - Out-pt  History of Present Illness: [name] [name] is a 71 y.o. 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     Recent labs showed =    He is here today for a bone marrow biopsy.  He is NPO. Only complaints are some swelling and numbness in his feet. No nausea/vomiting. No Fever/chills. ROS negative.   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  Medications: Prior to Admission medications   Medication Sig Start Date End Date Taking? Authorizing Provider  azithromycin (ZITHROMAX) 250 MG tablet Take two tabs the first day and then one tab daily for four days 12/21/21   Binnie Rail, MD  cefdinir (OMNICEF) 300 MG capsule Take 1 capsule (300 mg total) by mouth 2 (two) times daily. 12/21/21   Binnie Rail, MD  chlorpheniramine-HYDROcodone (TUSSIONEX) 10-8 MG/5ML Take 5 mLs by mouth every 12 (twelve) hours as needed for cough. 12/16/21   Binnie Rail, MD  furosemide (LASIX) 40 MG tablet Take 40 mg by mouth daily as needed. 05/04/20   [provider]  hydrALAZINE (APRESOLINE) 100 MG tablet TAKE 1 TABLET BY MOUTH THREE TIMES A DAY 11/23/21   Martinique, Peter M, MD  levothyroxine (SYNTHROID) 150 MCG tablet TAKE 1 TABLET BY MOUTH EVERY DAY 12/30/21   Binnie Rail, MD  Melatonin 10 MG SUBL Place 10 mg under  the tongue as needed.    [provider]  Multiple Vitamins-Minerals (CENTRUM SILVER 50+MEN PO) Take by mouth.    [provider]  rosuvastatin (CRESTOR) 20 MG tablet TAKE 1 TABLET BY MOUTH EVERY DAY 11/23/21   Binnie Rail, MD     Family History  Problem Relation Age of Onset   Alzheimer's disease Mother    Heart attack Father 30   Diabetes Brother    COPD Brother    Dementia Brother    Stroke Brother    Hypertension Brother         X 3   Heart disease Paternal Grandfather        in 50s   Coronary artery disease Brother        CBAG/vavlve replacement   Cancer Neg Hx    Colon cancer Neg Hx    Colon polyps Neg Hx    Rectal cancer Neg Hx    Stomach cancer Neg Hx    Esophageal cancer Neg Hx     Social History   Socioeconomic History   Marital status: Married    Spouse name: Not on file   Number of children: 2   Years of education: Not on file   Highest education level: Not on file  Occupational History   Occupation: Retired    Comment: Airline pilot  Tobacco Use   Smoking status: Never   Smokeless tobacco: Never   Tobacco comments:    NEVER USED TOBACCO  Vaping Use   Vaping Use: Never used  Substance and Sexual Activity   Alcohol use: Yes    Alcohol/week: 0.0 standard drinks of alcohol    Comment:  1-2 wine / week   Drug use: No   Sexual activity: Not on file  Other Topics Concern   Not on file  Social History Narrative   Exercise:  No regular exercise, active, yardwork   Social Determinants of Health   Financial Resource Strain: Low Risk  (12/04/2020)   Overall Financial Resource Strain (CARDIA)    Difficulty of Paying Living Expenses: Not hard at all  Food Insecurity: No Food Insecurity (12/04/2020)   Hunger Vital Sign    Worried About Running Out of Food in the Last Year: Never true    Ran Out of Food in the Last Year: Never true  Transportation Needs: No Transportation Needs (12/04/2020)   PRAPARE - Radiographer, therapeutic (Medical): No    Lack of Transportation (Non-Medical): No  Physical Activity: Inactive (12/04/2020)   Exercise Vital Sign    Days of Exercise per Week: 0 days    Minutes of Exercise per Session: 0 min  Stress: No Stress Concern Present (12/04/2020)   [place]    Feeling of Stress : Not at all  Social Connections: [place] (12/04/2020)   Social Connection and Isolation Panel [NHANES]    Frequency of Communication with Friends and Family: More than three times a week    Frequency of Social Gatherings with Friends and Family: More than three times a week    Attends Religious Services: 1 to 4 times per year    Active Member of [employer] or Organizations: Yes    Attends Archivist Meetings: 1 to 4 times per year    Marital Status: Married     Review of Systems: A 12 point ROS discussed and pertinent positives are indicated in the HPI above.  All other systems are negative.  Review of Systems  Vital Signs: There were no vitals taken for this visit.  Physical Exam Vitals reviewed.  Constitutional:      Appearance: Normal appearance.  HENT:     Head: Normocephalic and atraumatic.  Eyes:     Extraocular Movements: Extraocular movements intact.  Cardiovascular:     Rate and Rhythm: Normal rate and regular rhythm.  Pulmonary:     Effort: Pulmonary effort is normal. No respiratory distress.     Breath sounds: Normal breath sounds.  Abdominal:     General: There is no distension.     Palpations: Abdomen is soft.     Tenderness: There is no abdominal tenderness.  Musculoskeletal:        General: Normal range of motion.     Cervical back: Normal range of motion.  Skin:    General: Skin is warm and dry.  Neurological:     General: No focal deficit present.     Mental Status: He is alert and oriented to person, place, and time.  Psychiatric:        Mood and Affect: Mood normal.         Behavior: Behavior normal.        Thought Content: Thought content normal.        Judgment: Judgment normal.  Imaging: No results found.  Labs:  CBC: Recent Labs    02/19/21 0926 05/21/21 0819 08/23/21 0902 11/24/21 0859  WBC 4.8 3.7* 4.6 4.7  HGB 11.9* 11.6* 11.4* 11.1*  HCT 36.3* 35.1* 34.1* 34.3*  PLT 108* 107* 126* 148*    COAGS: No results for input(s): "INR", "APTT" in the last 8760 hours.  BMP: Recent Labs    02/19/21 0926 05/21/21 0819 08/23/21 0902 11/24/21 0859  NA 138 140 138 138  K 4.0 4.5 3.9 4.3  CL 101 104 103 103  CO2 '29 30 26 27  ' GLUCOSE 90 77 105* 93  BUN 29* 26* 30* 32*  CALCIUM 10.0 9.3 10.1 10.3  CREATININE 1.92* 1.73* 1.64* 1.59*  GFRNONAA 37* 42* 45* 47*    LIVER FUNCTION TESTS: Recent Labs    02/19/21 0926 05/21/21 0819 08/23/21 0902 11/24/21 0859  BILITOT 0.7 0.7 0.4 0.3  AST '25 25 21 21  ' ALT '15 16 18 18  ' ALKPHOS 59 48 51 45  PROT 7.2 6.8 7.9 8.3*  ALBUMIN 4.4 4.0 4.4 4.4    TUMOR MARKERS: No results for input(s): "AFPTM", "CEA", "CA199", "CHROMGRNA" in the last 8760 hours.  Assessment and Plan:  Elevated Kappa free light chain  Will proceed with image guided bone marrow biopsy today by Dr. Kathlene Cote.   Risks and benefits of bone marrow biopsy was discussed with the patient and/or patient's family including, but not limited to bleeding, infection, damage to adjacent structures or low yield requiring additional tests.  All of the questions were answered and there is agreement to proceed.  Consent signed and in chart.   Thank you for allowing our service to participate in Cal Gindlesperger 's care.  Electronically Signed: Murrell Redden, PA-C   01/03/2022, 11:20 AM      I spent a total of  30 Minutes in face to face in clinical consultation, greater than 50% of which was counseling/coordinating care for bone marrow biopsy.

## 2022-01-04 ENCOUNTER — Ambulatory Visit (HOSPITAL_COMMUNITY)
Admission: RE | Admit: 2022-01-04 | Discharge: 2022-01-04 | Disposition: A | Discharge status: Home / Self Care | Payer: PPO | Source: Ambulatory Visit | Attending: Hematology & Oncology | Admitting: Hematology & Oncology

## 2022-01-04 ENCOUNTER — Encounter (HOSPITAL_COMMUNITY): Payer: Self-pay

## 2022-01-04 ENCOUNTER — Other Ambulatory Visit: Payer: Self-pay

## 2022-01-04 DIAGNOSIS — D72829 Elevated white blood cell count, unspecified: Secondary | ICD-10-CM | POA: Diagnosis not present

## 2022-01-04 DIAGNOSIS — Z1379 Encounter for other screening for genetic and chromosomal anomalies: Secondary | ICD-10-CM | POA: Diagnosis not present

## 2022-01-04 DIAGNOSIS — M7989 Other specified soft tissue disorders: Secondary | ICD-10-CM | POA: Insufficient documentation

## 2022-01-04 DIAGNOSIS — D808 Other immunodeficiencies with predominantly antibody defects: Secondary | ICD-10-CM | POA: Diagnosis not present

## 2022-01-04 DIAGNOSIS — D649 Anemia, unspecified: Secondary | ICD-10-CM | POA: Diagnosis not present

## 2022-01-04 DIAGNOSIS — R200 Anesthesia of skin: Secondary | ICD-10-CM | POA: Diagnosis not present

## 2022-01-04 DIAGNOSIS — Z8572 Personal history of non-Hodgkin lymphomas: Secondary | ICD-10-CM | POA: Diagnosis not present

## 2022-01-04 DIAGNOSIS — C880 Waldenstrom macroglobulinemia: Secondary | ICD-10-CM | POA: Insufficient documentation

## 2022-01-04 DIAGNOSIS — D630 Anemia in neoplastic disease: Secondary | ICD-10-CM | POA: Diagnosis not present

## 2022-01-04 DIAGNOSIS — R768 Other specified abnormal immunological findings in serum: Secondary | ICD-10-CM | POA: Insufficient documentation

## 2022-01-04 LAB — CBC WITH DIFFERENTIAL/PLATELET
Abs Immature Granulocytes: 0 10*3/uL (ref 0.00–0.07)
Basophils Absolute: 0 10*3/uL (ref 0.0–0.1)
Basophils Relative: 1 %
Eosinophils Absolute: 0 10*3/uL (ref 0.0–0.5)
Eosinophils Relative: 1 %
HCT: 33.2 % — ABNORMAL LOW (ref 39.0–52.0)
Hemoglobin: 10.4 g/dL — ABNORMAL LOW (ref 13.0–17.0)
Immature Granulocytes: 0 %
Lymphocytes Relative: 32 %
Lymphs Abs: 1.5 10*3/uL (ref 0.7–4.0)
MCH: 30.8 pg (ref 26.0–34.0)
MCHC: 31.3 g/dL (ref 30.0–36.0)
MCV: 98.2 fL (ref 80.0–100.0)
Monocytes Absolute: 0.7 10*3/uL (ref 0.1–1.0)
Monocytes Relative: 15 %
Neutro Abs: 2.4 10*3/uL (ref 1.7–7.7)
Neutrophils Relative %: 51 %
Platelets: 160 10*3/uL (ref 150–400)
RBC: 3.38 MIL/uL — ABNORMAL LOW (ref 4.22–5.81)
RDW: 13.3 % (ref 11.5–15.5)
WBC: 4.7 10*3/uL (ref 4.0–10.5)
nRBC: 0 % (ref 0.0–0.2)

## 2022-01-04 MED ORDER — MIDAZOLAM HCL 2 MG/2ML IJ SOLN
INTRAMUSCULAR | Status: AC
  Filled 2022-01-04: qty 4

## 2022-01-04 MED ORDER — FENTANYL CITRATE (PF) 100 MCG/2ML IJ SOLN
INTRAMUSCULAR | Status: AC
  Filled 2022-01-04: qty 4

## 2022-01-04 MED ORDER — MIDAZOLAM HCL 2 MG/2ML IJ SOLN
INTRAMUSCULAR | Status: AC | PRN
  Administered 2022-01-04 (×2): 1 mg via INTRAVENOUS

## 2022-01-04 MED ORDER — FENTANYL CITRATE (PF) 100 MCG/2ML IJ SOLN
INTRAMUSCULAR | Status: AC | PRN
  Administered 2022-01-04 (×2): 50 ug via INTRAVENOUS

## 2022-01-04 MED ORDER — SODIUM CHLORIDE 0.9 % IV SOLN: INTRAVENOUS | Status: DC

## 2022-01-04 NOTE — Discharge Instructions (Signed)
Discharge Instructions:   Please call Interventional Radiology clinic [phone] with any questions or concerns.    You may remove your dressing and shower tomorrow.  Do not submerge in water.   Bone Marrow Aspiration and Bone Marrow Biopsy, Adult, Care After This sheet gives you information about how to care for yourself after your procedure. Your health care provider may also give you more specific instructions. If you have problems or questions, contact your health care provider. What can I expect after the procedure? After the procedure, it is common to have: Mild pain and tenderness. Swelling. Bruising. Follow these instructions at home: Puncture site care  Follow instructions from your health care provider about how to take care of the puncture site. Make sure you: Wash your hands with soap and water before and after you change your bandage (dressing). If soap and water are not available, use hand sanitizer. Change your dressing as told by your health care provider. Check your puncture site every day for signs of infection. Check for: More redness, swelling, or pain. Fluid or blood. Warmth. Pus or a bad smell. Activity Return to your normal activities as told by your health care provider. Ask your health care provider what activities are safe for you. Do not lift anything that is heavier than 10 lb (4.5 kg), or the limit that you are told, until your health care provider says that it is safe. Do not drive for 24 hours if you were given a sedative during your procedure. General instructions  Take over-the-counter and prescription medicines only as told by your health care provider. Do not take baths, swim, or use a hot tub until your health care provider approves. Ask your health care provider if you may take showers. You may only be allowed to take sponge baths. If directed, put ice on the affected area. To do this: Put ice in a plastic bag. Place a towel between your skin  and the bag. Leave the ice on for 20 minutes, 2-3 times a day. Keep all follow-up visits as told by your health care provider. This is important. Contact a health care provider if: Your pain is not controlled with medicine. You have a fever. You have more redness, swelling, or pain around the puncture site. You have fluid or blood coming from the puncture site. Your puncture site feels warm to the touch. You have pus or a bad smell coming from the puncture site. Summary After the procedure, it is common to have mild pain, tenderness, swelling, and bruising. Follow instructions from your health care provider about how to take care of the puncture site and what activities are safe for you. Take over-the-counter and prescription medicines only as told by your health care provider. Contact a health care provider if you have any signs of infection, such as fluid or blood coming from the puncture site. This information is not intended to replace advice given to you by your health care provider. Make sure you discuss any questions you have with your health care provider. Document Revised: 07/24/2018 Document Reviewed: 07/24/2018 Elsevier Patient Education  [place].    Moderate Conscious Sedation, Adult, Care After This sheet gives you information about how to care for yourself after your procedure. Your health care provider may also give you more specific instructions. If you have problems or questions, contact your health care provider. What can I expect after the procedure? After the procedure, it is common to have: Sleepiness for several hours. Impaired judgment  for several hours. Difficulty with balance. Vomiting if you eat too soon. Follow these instructions at home: For the time period you were told by your health care provider: Rest. Do not participate in activities where you could fall or become injured. Do not drive or use machinery. Do not drink alcohol. Do not take  sleeping pills or medicines that cause drowsiness. Do not make important decisions or sign legal documents. Do not take care of children on your own. Eating and drinking  Follow the diet recommended by your health care provider. Drink enough fluid to keep your urine pale yellow. If you vomit: Drink water, juice, or soup when you can drink without vomiting. Make sure you have little or no nausea before eating solid foods. General instructions Take over-the-counter and prescription medicines only as told by your health care provider. Have a responsible adult stay with you for the time you are told. It is important to have someone help care for you until you are awake and alert. Do not smoke. Keep all follow-up visits as told by your health care provider. This is important. Contact a health care provider if: You are still sleepy or having trouble with balance after 24 hours. You feel light-headed. You keep feeling nauseous or you keep vomiting. You develop a rash. You have a fever. You have redness or swelling around the IV site. Get help right away if: You have trouble breathing. You have new-onset confusion at home. Summary After the procedure, it is common to feel sleepy, have impaired judgment, or feel nauseous if you eat too soon. Rest after you get home. Know the things you should not do after the procedure. Follow the diet recommended by your health care provider and drink enough fluid to keep your urine pale yellow. Get help right away if you have trouble breathing or new-onset confusion at home. This information is not intended to replace advice given to you by your health care provider. Make sure you discuss any questions you have with your health care provider. Document Revised: 07/05/2019 Document Reviewed: 01/31/2019 Elsevier Patient Education  [place].

## 2022-01-04 NOTE — Procedures (Signed)
Interventional Radiology Procedure Note  Procedure: CT guided bone marrow aspiration and biopsy  Complications: None  EBL: < 10 mL  Findings: Aspirate and core biopsy performed of bone marrow in right iliac bone.  Plan: Bedrest supine x 1 hrs  Zanna Hawn T. Laisha Rau, M.D Pager:  [phone]   

## 2022-01-06 LAB — SURGICAL PATHOLOGY

## 2022-01-10 ENCOUNTER — Encounter: Payer: Self-pay | Admitting: Hematology & Oncology

## 2022-01-17 ENCOUNTER — Other Ambulatory Visit: Payer: Self-pay | Admitting: *Deleted

## 2022-01-17 ENCOUNTER — Encounter (HOSPITAL_COMMUNITY): Payer: Self-pay | Admitting: Hematology & Oncology

## 2022-01-17 ENCOUNTER — Encounter: Payer: Self-pay | Admitting: Hematology & Oncology

## 2022-01-17 DIAGNOSIS — C880 Waldenstrom macroglobulinemia: Secondary | ICD-10-CM

## 2022-01-18 ENCOUNTER — Encounter: Payer: Self-pay | Admitting: Hematology & Oncology

## 2022-01-18 ENCOUNTER — Inpatient Hospital Stay: Payer: PPO | Admitting: Hematology & Oncology

## 2022-01-18 ENCOUNTER — Inpatient Hospital Stay: Payer: PPO | Attending: Hematology & Oncology

## 2022-01-18 ENCOUNTER — Other Ambulatory Visit: Payer: Self-pay

## 2022-01-18 VITALS — BP 138/71 | HR 92 | Temp 98.0°F | Resp 18 | Wt 198.1 lb

## 2022-01-18 DIAGNOSIS — C8583 Other specified types of non-Hodgkin lymphoma, intra-abdominal lymph nodes: Secondary | ICD-10-CM | POA: Diagnosis not present

## 2022-01-18 DIAGNOSIS — R109 Unspecified abdominal pain: Secondary | ICD-10-CM | POA: Insufficient documentation

## 2022-01-18 DIAGNOSIS — C880 Waldenstrom macroglobulinemia: Secondary | ICD-10-CM

## 2022-01-18 DIAGNOSIS — Z79899 Other long term (current) drug therapy: Secondary | ICD-10-CM | POA: Insufficient documentation

## 2022-01-18 LAB — CBC WITH DIFFERENTIAL (CANCER CENTER ONLY)
Abs Immature Granulocytes: 0.02 10*3/uL (ref 0.00–0.07)
Basophils Absolute: 0 10*3/uL (ref 0.0–0.1)
Basophils Relative: 1 %
Eosinophils Absolute: 0.1 10*3/uL (ref 0.0–0.5)
Eosinophils Relative: 1 %
HCT: 36.8 % — ABNORMAL LOW (ref 39.0–52.0)
Hemoglobin: 11.9 g/dL — ABNORMAL LOW (ref 13.0–17.0)
Immature Granulocytes: 0 %
Lymphocytes Relative: 40 %
Lymphs Abs: 2.1 10*3/uL (ref 0.7–4.0)
MCH: 30.8 pg (ref 26.0–34.0)
MCHC: 32.3 g/dL (ref 30.0–36.0)
MCV: 95.3 fL (ref 80.0–100.0)
Monocytes Absolute: 0.6 10*3/uL (ref 0.1–1.0)
Monocytes Relative: 12 %
Neutro Abs: 2.4 10*3/uL (ref 1.7–7.7)
Neutrophils Relative %: 46 %
Platelet Count: 175 10*3/uL (ref 150–400)
RBC: 3.86 MIL/uL — ABNORMAL LOW (ref 4.22–5.81)
RDW: 13.1 % (ref 11.5–15.5)
WBC Count: 5.2 10*3/uL (ref 4.0–10.5)
nRBC: 0 % (ref 0.0–0.2)

## 2022-01-18 LAB — COMPREHENSIVE METABOLIC PANEL
ALT: 14 U/L (ref 0–44)
AST: 20 U/L (ref 15–41)
Albumin: 4.5 g/dL (ref 3.5–5.0)
Alkaline Phosphatase: 58 U/L (ref 38–126)
Anion gap: 11 (ref 5–15)
BUN: 21 mg/dL (ref 8–23)
CO2: 26 mmol/L (ref 22–32)
Calcium: 10.6 mg/dL — ABNORMAL HIGH (ref 8.9–10.3)
Chloride: 96 mmol/L — ABNORMAL LOW (ref 98–111)
Creatinine, Ser: 1.73 mg/dL — ABNORMAL HIGH (ref 0.61–1.24)
GFR, Estimated: 42 mL/min — ABNORMAL LOW (ref >60)
Glucose, Bld: 127 mg/dL — ABNORMAL HIGH (ref 70–99)
Potassium: 3.9 mmol/L (ref 3.5–5.1)
Sodium: 133 mmol/L — ABNORMAL LOW (ref 135–145)
Total Bilirubin: 0.5 mg/dL (ref 0.3–1.2)
Total Protein: 8.4 g/dL — ABNORMAL HIGH (ref 6.5–8.1)

## 2022-01-18 LAB — IRON AND IRON BINDING CAPACITY (CC-WL,HP ONLY)
Iron: 76 ug/dL (ref 45–182)
Saturation Ratios: 22 % (ref 17.9–39.5)
TIBC: 339 ug/dL (ref 250–450)
UIBC: 263 ug/dL (ref 117–376)

## 2022-01-18 LAB — FERRITIN: Ferritin: 36 ng/mL (ref 24–336)

## 2022-01-18 LAB — RETICULOCYTES
Immature Retic Fract: 14.2 % (ref 2.3–15.9)
RBC.: 3.83 MIL/uL — ABNORMAL LOW (ref 4.22–5.81)
Retic Count, Absolute: 93.1 10*3/uL (ref 19.0–186.0)
Retic Ct Pct: 2.4 % (ref 0.4–3.1)

## 2022-01-18 LAB — LACTATE DEHYDROGENASE: LDH: 131 U/L (ref 98–192)

## 2022-01-18 MED ORDER — VENLAFAXINE HCL ER 37.5 MG PO CP24: 37.5000 mg | ORAL_CAPSULE | Freq: Every day | ORAL | 5 refills | Status: DC

## 2022-01-18 MED ORDER — FAMCICLOVIR 250 MG PO TABS: 250.0000 mg | ORAL_TABLET | Freq: Every day | ORAL | 12 refills | Status: DC

## 2022-01-18 NOTE — Progress Notes (Signed)
START ON PATHWAY REGIMEN - Lymphoma and CLL     A cycle is every 28 days:     Rituximab-xxxx      Bendamustine   **Always confirm dose/schedule in your pharmacy ordering system**  Patient Characteristics: [place] Macroglobulinemia/Lymphoplasmacytic Lymphoma, Symptomatic, First Line Disease Type: [place] Macroglobulinemia/Lymphoplasmacytic Lymphoma Disease Type: Not Applicable Disease Type: Not Applicable Asymptomatic or Symptomatic<= Symptomatic Line of Therapy: First Line Intent of Therapy: Curative Intent, Discussed with Patient 

## 2022-01-18 NOTE — Progress Notes (Signed)
Hematology and Oncology Follow Up Visit  Daking Westervelt [id number] 1950/11/15 71 y.o. 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 Neila Gear is back for follow-up.  Unfortunately, he just has not been doing all that well.  I am unsure exactly why he has been having difficulty..  We did go ahead and do a bone marrow biopsy on him.  This was done on 01/04/2022. 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  There is about 10% abnormal cells.  They were monoclonal for Kappa light chain.  As such, he may have had some transformation to a lymphoplasmacytic lymphoma.  Back in September, his IgM level was 2150 mg/dL.  His Kappa light chain was 27.2 mg/dL.  He had a 1.3 g/dL protein in his serum.  He just does not have a lot of energy.  He has some dizziness.  He has lost some weight.  It is possible that he may have some hyperviscosity despite the fact that he does not have the that high of a IgM level.  I think we will going to have to get him on the treatment again.  I really hate to have to do this but I really do not see any other way.  I think that would be reasonable would be Rituxan/Bendamustine.  I think we need to try to get a quick response.  I talked to he and his wife about this.  They are in agreement to get started with treatment.  He will need to have a Port-A-Cath placed.  I think we also need to see about getting a echocardiogram on him.  I want to make sure that his heart is doing okay and that there is no cardiac issues that might be causing some of his symptoms.  I also think that he might need to have an MRI of the brain to make  sure there is nothing with respect to a CNS etiology.  He has had no fever.  He has had no bleeding.  There is been no rashes.  He has had no obvious change in bowel or bladder habits.  He has not noted any palpable lymph nodes.  We did do a CT scan of his body back in September.  There is no obvious adenopathy.  Currently, his performance status is probably ECOG 1.  Medications:  Current Outpatient Medications:    famciclovir (FAMVIR) 250 MG tablet, Take 1 tablet (250 mg total) by mouth daily., Disp: 30 tablet, Rfl: 12   furosemide (LASIX) 40 MG tablet, Take 40 mg by mouth daily as needed., Disp: , Rfl:    hydrALAZINE (APRESOLINE) 100 MG tablet, TAKE 1 TABLET BY MOUTH THREE TIMES A DAY, Disp: 270 tablet, Rfl: 1   levothyroxine (SYNTHROID) 150 MCG tablet, TAKE 1 TABLET BY MOUTH EVERY DAY, Disp: 90 tablet, Rfl: 1   Melatonin 10 MG SUBL, Place 10 mg under the tongue as needed., Disp: , Rfl:    Multiple Vitamins-Minerals (CENTRUM SILVER 50+MEN PO), Take by mouth., Disp: , Rfl:    rosuvastatin (CRESTOR) 20 MG tablet, TAKE 1 TABLET BY MOUTH EVERY DAY, Disp: 90 tablet, Rfl: 1  venlafaxine XR (EFFEXOR-XR) 37.5 MG 24 hr capsule, Take 1 capsule (37.5 mg total) by mouth daily with breakfast., Disp: 30 capsule, Rfl: 5   chlorpheniramine-HYDROcodone (TUSSIONEX) 10-8 MG/5ML, Take 5 mLs by mouth every 12 (twelve) hours as needed for cough. (Patient not taking: Reported on 01/18/2022), Disp: 115 mL, Rfl: 0  Allergies: No Known Allergies  Past Medical History, Surgical history, Social history, and Family History were reviewed and updated.  Review of Systems: Review of Systems  Constitutional: Negative.   HENT:  Negative.    Eyes: Negative.   Respiratory: Negative.    Cardiovascular: Negative.   Gastrointestinal:  Positive for abdominal pain and nausea.  Endocrine: Negative.   Genitourinary: Negative.    Musculoskeletal: Negative.   Skin: Negative.   Neurological: Negative.   Hematological:  Negative.   Psychiatric/Behavioral: Negative.      Physical Exam:  weight is 198 lb 1.9 oz (89.9 kg). His oral temperature is 98 F (36.7 C). His blood pressure is 138/71 and his pulse is 92. His respiration is 18 and oxygen saturation is 97%.   Wt Readings from Last 3 Encounters:  01/18/22 198 lb 1.9 oz (89.9 kg)  01/04/22 195 lb (88.5 kg)  12/16/21 207 lb (93.9 kg)    Physical Exam Vitals reviewed.  HENT:     Head: Normocephalic and atraumatic.  Eyes:     Pupils: Pupils are equal, round, and reactive to light.  Cardiovascular:     Rate and Rhythm: Normal rate and regular rhythm.     Heart sounds: Normal heart sounds.  Pulmonary:     Effort: Pulmonary effort is normal.     Breath sounds: Normal breath sounds.  Abdominal:     General: Bowel sounds are normal.     Palpations: Abdomen is soft.  Musculoskeletal:        General: No tenderness or deformity. Normal range of motion.     Cervical back: Normal range of motion.  Lymphadenopathy:     Cervical: No cervical adenopathy.  Skin:    General: Skin is warm and dry.     Findings: No erythema or rash.  Neurological:     Mental Status: He is alert and oriented to person, place, and time.  Psychiatric:        Behavior: Behavior normal.        Thought Content: Thought content normal.        Judgment: Judgment normal.     Lab Results  Component Value Date   WBC 5.2 01/18/2022   HGB 11.9 (L) 01/18/2022   HCT 36.8 (L) 01/18/2022   MCV 95.3 01/18/2022   PLT 175 01/18/2022     Chemistry      Component Value Date/Time   NA 133 (L) 01/18/2022 1115   NA 138 12/06/2019 1501   NA 142 04/25/2016 0844   NA 140 10/19/2015 0903   K 3.9 01/18/2022 1115   K 4.1 04/25/2016 0844   K 4.1 10/19/2015 0903   CL 96 (L) 01/18/2022 1115   CL 102 04/25/2016 0844   CO2 26 01/18/2022 1115   CO2 29 04/25/2016 0844   CO2 25 10/19/2015 0903   BUN 21 01/18/2022 1115   BUN 20 12/06/2019 1501   BUN 14 04/25/2016 0844   BUN 21.6  10/19/2015 0903   CREATININE 1.73 (H) 01/18/2022 1115   CREATININE 1.59 (H) 11/24/2021 0859   CREATININE 1.1 04/25/2016 0844   CREATININE 1.3 10/19/2015 0903      Component Value Date/Time  CALCIUM 10.6 (H) 01/18/2022 1115   CALCIUM 9.6 04/25/2016 0844   CALCIUM 9.4 10/19/2015 0903   ALKPHOS 58 01/18/2022 1115   ALKPHOS 59 04/25/2016 0844   ALKPHOS 68 10/19/2015 0903   AST 20 01/18/2022 1115   AST 21 11/24/2021 0859   AST 21 10/19/2015 0903   ALT 14 01/18/2022 1115   ALT 18 11/24/2021 0859   ALT 27 04/25/2016 0844   ALT 19 10/19/2015 0903   BILITOT 0.5 01/18/2022 1115   BILITOT 0.3 11/24/2021 0859   BILITOT 0.54 10/19/2015 0903      Impression and Plan: Mr. Neila Gear is a 71 year old white male.  We treated him 10 years ago.  He had a marginal zone lymphoma.  He went into clinical remission.  When he initially presented, he had splenomegaly.  When he relapsed, he again had splenomegaly.  We subsequently treated him with Imbruvica and venetoclax.  He completed his treatments back in April.  Unfortunately, he just has not had a good response as he thought he would have.  He will be very interesting to see what his light chain and his IgM level is.  Again, we have to get started on treatment.  We will try to get started next week if possible.  I will put him on Famvir (250 mg p.o. daily) for shingles prophylaxis.  I realize that he has already had a shingles vaccine.  I think everything will be based upon his IgM level.  Hopefully, will not find anything with his MRI of the brain or with the echocardiogram.  Again he will need to have the Port-A-Cath placed.  I would like to see him back in December when he has his second cycle of treatment.  I would probably plan for 6-8 cycles of treatment and then see about maintenance Rituxan.    [name], MD 10/31/20231:17 PM

## 2022-01-19 ENCOUNTER — Other Ambulatory Visit: Payer: Self-pay

## 2022-01-19 LAB — KAPPA/LAMBDA LIGHT CHAINS
Kappa free light chain: 281.3 mg/L — ABNORMAL HIGH (ref 3.3–19.4)
Kappa, lambda light chain ratio: 21.47 — ABNORMAL HIGH (ref 0.26–1.65)
Lambda free light chains: 13.1 mg/L (ref 5.7–26.3)

## 2022-01-20 ENCOUNTER — Other Ambulatory Visit: Payer: Self-pay | Admitting: *Deleted

## 2022-01-20 ENCOUNTER — Telehealth: Payer: Self-pay

## 2022-01-20 DIAGNOSIS — C8583 Other specified types of non-Hodgkin lymphoma, intra-abdominal lymph nodes: Secondary | ICD-10-CM

## 2022-01-20 LAB — IGG, IGA, IGM
IgA: 144 mg/dL (ref 61–437)
IgG (Immunoglobin G), Serum: 645 mg/dL (ref 603–1613)
IgM (Immunoglobulin M), Srm: 2648 mg/dL — ABNORMAL HIGH (ref 20–172)

## 2022-01-20 MED ORDER — LIDOCAINE-PRILOCAINE 2.5-2.5 % EX CREA: TOPICAL_CREAM | CUTANEOUS | 3 refills | Status: DC

## 2022-01-20 MED ORDER — ONDANSETRON HCL 8 MG PO TABS: 8.0000 mg | ORAL_TABLET | Freq: Three times a day (TID) | ORAL | 1 refills | Status: DC | PRN

## 2022-01-20 MED ORDER — DEXAMETHASONE 4 MG PO TABS: 8.0000 mg | ORAL_TABLET | Freq: Every day | ORAL | 1 refills | Status: DC

## 2022-01-20 MED ORDER — PROCHLORPERAZINE MALEATE 10 MG PO TABS: 10.0000 mg | ORAL_TABLET | Freq: Four times a day (QID) | ORAL | 1 refills | Status: DC | PRN

## 2022-01-20 NOTE — Telephone Encounter (Signed)
-----   Message from [name], MD sent at 01/20/2022  9:36 AM EDT ----- Call and let him know that the IgM level is now over 2600.  This could certainly be contributing to his symptoms.  Thanks.Marland Kitchen

## 2022-01-21 NOTE — Progress Notes (Unsigned)
Pharmacist Chemotherapy Monitoring - Initial Assessment    Anticipated start date: 01/27/22   The following has been reviewed per standard work regarding the patient's treatment regimen: The patient's diagnosis, treatment plan and drug doses, and organ/hematologic function Lab orders and baseline tests specific to treatment regimen  The treatment plan start date, drug sequencing, and pre-medications Prior authorization status  Patient's documented medication list, including drug-drug interaction screen and prescriptions for anti-emetics and supportive care specific to the treatment regimen The drug concentrations, fluid compatibility, administration routes, and timing of the medications to be used The patient's access for treatment and lifetime cumulative dose history, if applicable  The patient's medication allergies and previous infusion related reactions, if applicable   Changes made to treatment plan:  N/A  Follow up needed:  Follow up 11/8 ECHO, 11/9 Hep panel   Arryn Terrones, Jacqlyn Larsen, [facility], 01/21/2022  7:59 AM

## 2022-01-23 ENCOUNTER — Ambulatory Visit (HOSPITAL_COMMUNITY)
Admission: RE | Admit: 2022-01-23 | Discharge: 2022-01-23 | Disposition: A | Discharge status: Home / Self Care | Payer: PPO | Source: Ambulatory Visit | Attending: Hematology & Oncology | Admitting: Hematology & Oncology

## 2022-01-23 DIAGNOSIS — R42 Dizziness and giddiness: Secondary | ICD-10-CM | POA: Diagnosis not present

## 2022-01-23 DIAGNOSIS — C8583 Other specified types of non-Hodgkin lymphoma, intra-abdominal lymph nodes: Secondary | ICD-10-CM | POA: Diagnosis not present

## 2022-01-23 MED ORDER — GADOBUTROL 1 MMOL/ML IV SOLN
9.0000 mL | Freq: Once | INTRAVENOUS | Status: AC | PRN
  Administered 2022-01-23: 9 mL via INTRAVENOUS

## 2022-01-24 ENCOUNTER — Encounter: Payer: Self-pay | Admitting: *Deleted

## 2022-01-24 LAB — PROTEIN ELECTROPHORESIS, SERUM, WITH REFLEX
A/G Ratio: 0.9 (ref 0.7–1.7)
Albumin ELP: 3.8 g/dL (ref 2.9–4.4)
Alpha-1-Globulin: 0.3 g/dL (ref 0.0–0.4)
Alpha-2-Globulin: 1 g/dL (ref 0.4–1.0)
Beta Globulin: 0.9 g/dL (ref 0.7–1.3)
Gamma Globulin: 2.2 g/dL — ABNORMAL HIGH (ref 0.4–1.8)
Globulin, Total: 4.4 g/dL — ABNORMAL HIGH (ref 2.2–3.9)
M-Spike, %: 1.6 g/dL — ABNORMAL HIGH
SPEP Interpretation: 0
Total Protein ELP: 8.2 g/dL (ref 6.0–8.5)

## 2022-01-24 LAB — IMMUNOFIXATION REFLEX, SERUM
IgA: 140 mg/dL (ref 61–437)
IgG (Immunoglobin G), Serum: 669 mg/dL (ref 603–1613)
IgM (Immunoglobulin M), Srm: 2467 mg/dL — ABNORMAL HIGH (ref 20–172)

## 2022-01-24 NOTE — Progress Notes (Signed)
Approval received for Lidocaine-Prilocaine from 01/24/22-04/26/2022.  No reference number provided.

## 2022-01-25 ENCOUNTER — Other Ambulatory Visit: Payer: Self-pay | Admitting: Radiology

## 2022-01-26 ENCOUNTER — Ambulatory Visit (HOSPITAL_BASED_OUTPATIENT_CLINIC_OR_DEPARTMENT_OTHER)
Admission: RE | Admit: 2022-01-26 | Discharge: 2022-01-26 | Disposition: A | Discharge status: Home / Self Care | Payer: PPO | Source: Ambulatory Visit | Attending: Hematology & Oncology | Admitting: Hematology & Oncology

## 2022-01-26 ENCOUNTER — Encounter (HOSPITAL_COMMUNITY): Payer: Self-pay

## 2022-01-26 ENCOUNTER — Ambulatory Visit (HOSPITAL_COMMUNITY)
Admission: RE | Admit: 2022-01-26 | Discharge: 2022-01-26 | Disposition: A | Discharge status: Home / Self Care | Payer: PPO | Source: Ambulatory Visit | Attending: Hematology & Oncology | Admitting: Hematology & Oncology

## 2022-01-26 ENCOUNTER — Other Ambulatory Visit: Payer: Self-pay | Admitting: Hematology & Oncology

## 2022-01-26 ENCOUNTER — Other Ambulatory Visit: Payer: Self-pay

## 2022-01-26 DIAGNOSIS — C8583 Other specified types of non-Hodgkin lymphoma, intra-abdominal lymph nodes: Secondary | ICD-10-CM

## 2022-01-26 DIAGNOSIS — E785 Hyperlipidemia, unspecified: Secondary | ICD-10-CM | POA: Diagnosis not present

## 2022-01-26 DIAGNOSIS — Z452 Encounter for adjustment and management of vascular access device: Secondary | ICD-10-CM | POA: Diagnosis not present

## 2022-01-26 DIAGNOSIS — I10 Essential (primary) hypertension: Secondary | ICD-10-CM | POA: Insufficient documentation

## 2022-01-26 DIAGNOSIS — C884 Extranodal marginal zone B-cell lymphoma of mucosa-associated lymphoid tissue [MALT-lymphoma]: Secondary | ICD-10-CM | POA: Diagnosis not present

## 2022-01-26 DIAGNOSIS — Z0189 Encounter for other specified special examinations: Secondary | ICD-10-CM

## 2022-01-26 HISTORY — PX: IR IMAGING GUIDED PORT INSERTION: IMG5740

## 2022-01-26 LAB — VISCOSITY, SERUM: Viscosity, Serum: 2.1 rel.saline (ref 1.4–2.1)

## 2022-01-26 LAB — ECHOCARDIOGRAM COMPLETE
Area-P 1/2: 4.01 cm2
S' Lateral: 2.8 cm

## 2022-01-26 MED ORDER — FENTANYL CITRATE (PF) 100 MCG/2ML IJ SOLN
INTRAMUSCULAR | Status: AC | PRN
  Administered 2022-01-26: 50 ug via INTRAVENOUS

## 2022-01-26 MED ORDER — SODIUM CHLORIDE 0.9 % IV SOLN: INTRAVENOUS | Status: DC

## 2022-01-26 MED ORDER — MIDAZOLAM HCL 2 MG/2ML IJ SOLN
INTRAMUSCULAR | Status: AC | PRN
  Administered 2022-01-26: 1 mg via INTRAVENOUS

## 2022-01-26 MED ORDER — LIDOCAINE-EPINEPHRINE 1 %-1:100000 IJ SOLN
INTRAMUSCULAR | Status: AC
  Filled 2022-01-26: qty 1

## 2022-01-26 MED ORDER — FENTANYL CITRATE (PF) 100 MCG/2ML IJ SOLN
INTRAMUSCULAR | Status: AC
  Filled 2022-01-26: qty 2

## 2022-01-26 MED ORDER — MIDAZOLAM HCL 2 MG/2ML IJ SOLN
INTRAMUSCULAR | Status: AC
  Filled 2022-01-26: qty 4

## 2022-01-26 MED ORDER — HEPARIN SOD (PORK) LOCK FLUSH 100 UNIT/ML IV SOLN
INTRAVENOUS | Status: AC
  Administered 2022-01-26: 5 [IU]
  Filled 2022-01-26: qty 5

## 2022-01-26 MED ORDER — LIDOCAINE-EPINEPHRINE 1 %-1:100000 IJ SOLN
INTRAMUSCULAR | Status: AC | PRN
  Administered 2022-01-26: 20 mL

## 2022-01-26 NOTE — Discharge Instructions (Addendum)
Discharge Instructions:   Please call Interventional Radiology clinic [phone] with any questions or concerns.  You may remove your dressing and shower tomorrow.  Do not use EMLA / Lidocaine cream for 2 weeks post Port Insertion. Do not submerge into water.   Moderate Conscious Sedation, Adult, Care After This sheet gives you information about how to care for yourself after your procedure. Your health care provider may also give you more specific instructions. If you have problems or questions, contact your health care provider. What can I expect after the procedure? After the procedure, it is common to have: Sleepiness for several hours. Impaired judgment for several hours. Difficulty with balance. Vomiting if you eat too soon. Follow these instructions at home: For the time period you were told by your health care provider: Rest. Do not participate in activities where you could fall or become injured. Do not drive or use machinery. Do not drink alcohol. Do not take sleeping pills or medicines that cause drowsiness. Do not make important decisions or sign legal documents. Do not take care of children on your own. Eating and drinking  Follow the diet recommended by your health care provider. Drink enough fluid to keep your urine pale yellow. If you vomit: Drink water, juice, or soup when you can drink without vomiting. Make sure you have little or no nausea before eating solid foods. General instructions Take over-the-counter and prescription medicines only as told by your health care provider. Have a responsible adult stay with you for the time you are told. It is important to have someone help care for you until you are awake and alert. Do not smoke. Keep all follow-up visits as told by your health care provider. This is important. Contact a health care provider if: You are still sleepy or having trouble with balance after 24 hours. You feel light-headed. You keep  feeling nauseous or you keep vomiting. You develop a rash. You have a fever. You have redness or swelling around the IV site. Get help right away if: You have trouble breathing. You have new-onset confusion at home. Summary After the procedure, it is common to feel sleepy, have impaired judgment, or feel nauseous if you eat too soon. Rest after you get home. Know the things you should not do after the procedure. Follow the diet recommended by your health care provider and drink enough fluid to keep your urine pale yellow. Get help right away if you have trouble breathing or new-onset confusion at home. This information is not intended to replace advice given to you by your health care provider. Make sure you discuss any questions you have with your health care provider. Document Revised: 07/05/2019 Document Reviewed: 01/31/2019 Elsevier Patient Education  [place] Insertion, Care After The following information offers guidance on how to care for yourself after your procedure. Your health care provider may also give you more specific instructions. If you have problems or questions, contact your health care provider. What can I expect after the procedure? After the procedure, it is common to have: Discomfort at the port insertion site. Bruising on the skin over the port. This should improve over 3-4 days. Follow these instructions at home: [facility] care After your port is placed, you will get a manufacturer's information card. The card has information about your port. Keep this card with you at all times. Take care of the port as told by your health care provider. Ask your health care provider if you  or a family member can get training for taking care of the port at home. A home health care nurse will be be available to help care for the port. Make sure to remember what type of port you have. Incision care     Follow instructions from your health care provider  about how to take care of your port insertion site. Make sure you: Wash your hands with soap and water for at least 20 seconds before and after you change your bandage (dressing). If soap and water are not available, use hand sanitizer. Change your dressing as told by your health care provider. Leave stitches (sutures), skin glue, or adhesive strips in place. These skin closures may need to stay in place for 2 weeks or longer. If adhesive strip edges start to loosen and curl up, you may trim the loose edges. Do not remove adhesive strips completely unless your health care provider tells you to do that. Check your port insertion site every day for signs of infection. Check for: Redness, swelling, or pain. Fluid or blood. Warmth. Pus or a bad smell. Activity Return to your normal activities as told by your health care provider. Ask your health care provider what activities are safe for you. You may have to avoid lifting. Ask your health care provider how much you can safely lift. General instructions Take over-the-counter and prescription medicines only as told by your health care provider. Do not take baths, swim, or use a hot tub until your health care provider approves. Ask your health care provider if you may take showers. You may only be allowed to take sponge baths. If you were given a sedative during the procedure, it can affect you for several hours. Do not drive or operate machinery until your health care provider says that it is safe. Wear a medical alert bracelet in case of an emergency. This will tell any health care providers that you have a port. Keep all follow-up visits. This is important. Contact a health care provider if: You cannot flush your port with saline as directed, or you cannot draw blood from the port. You have a fever or chills. You have redness, swelling, or pain around your port insertion site. You have fluid or blood coming from your port insertion site. Your port  insertion site feels warm to the touch. You have pus or a bad smell coming from the port insertion site. Get help right away if: You have chest pain or shortness of breath. You have bleeding from your port that you cannot control. These symptoms may be an emergency. Get help right away. Call 911. Do not wait to see if the symptoms will go away. Do not drive yourself to the hospital. Summary Take care of the port as told by your health care provider. Keep the manufacturer's information card with you at all times. Change your dressing as told by your health care provider. Contact a health care provider if you have a fever or chills or if you have redness, swelling, or pain around your port insertion site. Keep all follow-up visits. This information is not intended to replace advice given to you by your health care provider. Make sure you discuss any questions you have with your health care provider. Document Revised: 09/08/2020 Document Reviewed: 09/08/2020 Elsevier Patient Education  [place].

## 2022-01-26 NOTE — Progress Notes (Signed)
1300 NS started at 1300 right hand for IR procedure.

## 2022-01-26 NOTE — H&P (Signed)
Referring Physician(s): [name]  Supervising Physician: Mir, Sharen Heck  Patient Status:  [facility]  Chief Complaint:  "I'm getting a port a cath"  Subjective: Patient familiar to IR service from Port-A-Cath placement in 2012 with removal in 2014 and bone marrow biopsy on 01/04/2022.  He has a history of marginal zone lymphoma and presents now with likely transformation to lymphoplasmacytic lymphoma.  He is scheduled today for Port-A-Cath placement to assist with treatment. Chemotherapy is scheduled for tomorrow.  He denies fever, headache, chest pain, dyspnea, cough, abdominal/back pain, nausea, vomiting or bleeding.  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  Medications: Prior to Admission medications   Medication Sig Start Date End Date Taking? Authorizing Provider  chlorpheniramine-HYDROcodone (TUSSIONEX) 10-8 MG/5ML Take 5 mLs by mouth every 12 (twelve) hours as needed for cough. Patient not taking: Reported on 01/18/2022 12/16/21   Binnie Rail, MD  dexamethasone (DECADRON) 4 MG tablet Take 2 tablets (8 mg total) by mouth daily. Start the day after bendamustine chemotherapy for 2 days. Take with food. 01/20/22   [name], MD  famciclovir (FAMVIR) 250 MG tablet Take 1 tablet (250 mg total) by mouth daily. 01/18/22   [name], MD  furosemide (LASIX) 40 MG tablet Take 40 mg by mouth daily as needed. 05/04/20   [provider]  hydrALAZINE (APRESOLINE) 100 MG tablet TAKE 1 TABLET BY MOUTH THREE  TIMES A DAY 11/23/21   Martinique, Peter M, MD  levothyroxine (SYNTHROID) 150 MCG tablet TAKE 1 TABLET BY MOUTH EVERY DAY 12/30/21   Binnie Rail, MD  lidocaine-prilocaine (EMLA) cream Apply to affected area once 01/20/22   [name], MD  Melatonin 10 MG SUBL Place 10 mg under the tongue as needed.    [provider]  Multiple Vitamins-Minerals (CENTRUM SILVER 50+MEN PO) Take by mouth.    [provider]  ondansetron (ZOFRAN) 8 MG tablet Take 1 tablet (8 mg total) by mouth every 8 (eight) hours as needed for nausea or vomiting. Start on the third day after chemotherapy. 01/20/22   [name], MD  prochlorperazine (COMPAZINE) 10 MG tablet Take 1 tablet (10 mg total) by mouth every 6 (six) hours as needed for nausea or vomiting. 01/20/22   [name], MD  rosuvastatin (CRESTOR) 20 MG tablet TAKE 1 TABLET BY MOUTH EVERY DAY 11/23/21   Binnie Rail, MD  venlafaxine XR (EFFEXOR-XR) 37.5 MG 24 hr capsule Take 1 capsule (37.5 mg total) by mouth daily with breakfast. 01/18/22   [name], MD     Vital Signs: BP 160/74, temp 98.1, heart rate 66, respirations 18, O2 sat 98% room air    Physical Exam awake, alert.  Chest clear to auscultation bilaterally.  Heart with regular rate and rhythm.  Abdomen soft, positive bowel sounds, nontender.  Trace pretibial edema bilaterally.  Imaging: MR Brain W Wo Contrast  Result Date: 01/23/2022  CLINICAL DATA:  71 year old male with recurrent lymphoma on bone marrow biopsy last month, possible transformation to lymphoplasmacytic type. Dizziness. Staging. EXAM: MRI HEAD WITHOUT AND WITH CONTRAST TECHNIQUE: Multiplanar, multiecho pulse sequences of the brain and surrounding structures were obtained without and with intravenous contrast. CONTRAST:  27m GADAVIST GADOBUTROL 1 MMOL/ML IV SOLN COMPARISON:  No prior brain imaging. FINDINGS: Brain: Cerebral volume is within normal limits for age. No restricted diffusion to suggest acute  infarction. No midline shift, mass effect, evidence of mass lesion, ventriculomegaly, extra-axial collection or acute intracranial hemorrhage. Cervicomedullary junction and pituitary are within normal limits. Incidental asymmetric choroid plexus cysts, normal variant. No abnormal enhancement identified. No dural thickening. Scattered mild to moderate for age nonspecific mostly subcortical white matter T2 and FLAIR hyperintense foci. Occasional punctate chronic microhemorrhages, such as the left corona radiata series 10, image 40. No cortical encephalomalacia. Vascular: Major intracranial vascular flow voids are preserved. The major dural venous sinuses are enhancing and appear patent following contrast. Skull and upper cervical spine: Cervical spine disc and endplate degeneration with multilevel mild spinal stenosis on series 7, image 13. Visible bone marrow signal is within normal limits. No suspicious skull marrow lesion. Sinuses/Orbits: Postoperative changes to both globes, otherwise negative orbits. Paranasal sinuses and mastoids are well aerated. Other: Visible internal auditory structures appear normal. Negative visible scalp and face. IMPRESSION: 1. No metastatic disease or acute intracranial abnormality identified. 2. Mild to moderate for age cerebral white matter signal changes, most commonly due to chronic small vessel disease. 3. Partially visible cervical spine degeneration and mild spinal stenosis. Electronically Signed   By: HGenevie AnnM.D.   On: 01/23/2022 09:26    Labs:  CBC: Recent Labs    08/23/21 0902 11/24/21 0859 01/04/22 0930 01/18/22 1115  WBC 4.6 4.7 4.7 5.2  HGB 11.4* 11.1* 10.4* 11.9*  HCT 34.1* 34.3* 33.2* 36.8*  PLT 126* 148* 160 175    COAGS: No results for input(s): "INR", "APTT" in the last 8760 hours.  BMP: Recent Labs    05/21/21 0819 08/23/21 0902 11/24/21 0859 01/18/22 1115  NA 140 138 138 133*  K 4.5 3.9 4.3 3.9  CL 104 103 103 96*  CO2 _0 GLUCOSE 77 105* 93 127*  BUN 26* 30* 32* 21  CALCIUM 9.3 10.1 10.3 10.6*  CREATININE 1.73* 1.64* 1.59* 1.73*  GFRNONAA 42* 45* 47* 42*    LIVER FUNCTION TESTS: Recent Labs    05/21/21 0819 08/23/21 0902 11/24/21 0859 01/18/22 1115  BILITOT 0.7 0.4 0.3 0.5  AST _1 ALT _2 ALKPHOS 48 51 45 58  PROT 6.8 7.9 8.3* 8.4*  ALBUMIN 4.0 4.4 4.4 4.5    Assessment and Plan: Patient familiar to IR service from Port-A-Cath placement in 2012 with removal in 2014 and bone marrow biopsy on 01/04/2022.  He has a history of marginal zone lymphoma and presents now with likely transformation to lymphoplasmacytic lymphoma.  He is scheduled today for Port-A-Cath placement to assist with treatment. Chemotherapy is scheduled for tomorrow.Risks and benefits of image guided port-a-catheter placement was discussed with the patient including, but not limited to bleeding, infection, pneumothorax, or fibrin sheath development and need for additional procedures.  All of the patient's questions were answered, patient is agreeable to proceed. Consent signed and in chart.    Electronically Signed: D. KRowe Robert PA-C 01/26/2022, 12:30 PM   I spent a total of 20 minutes at the the  patient's bedside AND on the patient's hospital floor or unit, greater than 50% of which was counseling/coordinating care for port a cath placement

## 2022-01-26 NOTE — Progress Notes (Signed)
  Echocardiogram 2D Echocardiogram has been performed.  [name] [name] M 01/26/2022, 11:30 AM

## 2022-01-26 NOTE — Procedures (Signed)
Interventional Radiology Procedure Note  Procedure: Port placement.  Indication: Lymphoma  Findings: Please refer to procedural dictation for full description.  Complications: None  EBL: < 10 mL  Miachel Roux, MD [phone]

## 2022-01-26 NOTE — Progress Notes (Signed)
1630 IV removed after IR procedure.

## 2022-01-27 ENCOUNTER — Encounter: Payer: Self-pay | Admitting: *Deleted

## 2022-01-27 ENCOUNTER — Inpatient Hospital Stay: Payer: PPO | Attending: Hematology & Oncology

## 2022-01-27 ENCOUNTER — Inpatient Hospital Stay: Payer: PPO

## 2022-01-27 VITALS — BP 131/60 | HR 81 | Temp 98.3°F | Resp 18

## 2022-01-27 VITALS — BP 141/74 | HR 72 | Temp 98.4°F | Resp 17

## 2022-01-27 DIAGNOSIS — Z5111 Encounter for antineoplastic chemotherapy: Secondary | ICD-10-CM | POA: Diagnosis not present

## 2022-01-27 DIAGNOSIS — Z5112 Encounter for antineoplastic immunotherapy: Secondary | ICD-10-CM | POA: Insufficient documentation

## 2022-01-27 DIAGNOSIS — C8583 Other specified types of non-Hodgkin lymphoma, intra-abdominal lymph nodes: Secondary | ICD-10-CM | POA: Diagnosis not present

## 2022-01-27 LAB — CBC WITH DIFFERENTIAL (CANCER CENTER ONLY)
Abs Immature Granulocytes: 0.02 10*3/uL (ref 0.00–0.07)
Basophils Absolute: 0 10*3/uL (ref 0.0–0.1)
Basophils Relative: 1 %
Eosinophils Absolute: 0.1 10*3/uL (ref 0.0–0.5)
Eosinophils Relative: 1 %
HCT: 31.3 % — ABNORMAL LOW (ref 39.0–52.0)
Hemoglobin: 10.1 g/dL — ABNORMAL LOW (ref 13.0–17.0)
Immature Granulocytes: 1 %
Lymphocytes Relative: 28 %
Lymphs Abs: 1.2 10*3/uL (ref 0.7–4.0)
MCH: 30.6 pg (ref 26.0–34.0)
MCHC: 32.3 g/dL (ref 30.0–36.0)
MCV: 94.8 fL (ref 80.0–100.0)
Monocytes Absolute: 0.5 10*3/uL (ref 0.1–1.0)
Monocytes Relative: 12 %
Neutro Abs: 2.5 10*3/uL (ref 1.7–7.7)
Neutrophils Relative %: 57 %
Platelet Count: 155 10*3/uL (ref 150–400)
RBC: 3.3 MIL/uL — ABNORMAL LOW (ref 4.22–5.81)
RDW: 12.9 % (ref 11.5–15.5)
WBC Count: 4.2 10*3/uL (ref 4.0–10.5)
nRBC: 0 % (ref 0.0–0.2)

## 2022-01-27 LAB — CMP (CANCER CENTER ONLY)
ALT: 13 U/L (ref 0–44)
AST: 17 U/L (ref 15–41)
Albumin: 4 g/dL (ref 3.5–5.0)
Alkaline Phosphatase: 47 U/L (ref 38–126)
Anion gap: 7 (ref 5–15)
BUN: 24 mg/dL — ABNORMAL HIGH (ref 8–23)
CO2: 28 mmol/L (ref 22–32)
Calcium: 10 mg/dL (ref 8.9–10.3)
Chloride: 102 mmol/L (ref 98–111)
Creatinine: 1.46 mg/dL — ABNORMAL HIGH (ref 0.61–1.24)
GFR, Estimated: 52 mL/min — ABNORMAL LOW (ref >60)
Glucose, Bld: 80 mg/dL (ref 70–99)
Potassium: 4 mmol/L (ref 3.5–5.1)
Sodium: 137 mmol/L (ref 135–145)
Total Bilirubin: 0.4 mg/dL (ref 0.3–1.2)
Total Protein: 7.8 g/dL (ref 6.5–8.1)

## 2022-01-27 LAB — HEPATITIS B CORE ANTIBODY, TOTAL: Hep B Core Total Ab: NONREACTIVE

## 2022-01-27 LAB — HEPATITIS B SURFACE ANTIGEN: Hepatitis B Surface Ag: NONREACTIVE

## 2022-01-27 MED ORDER — SODIUM CHLORIDE 0.9% FLUSH
10.0000 mL | INTRAVENOUS | Status: DC | PRN
  Administered 2022-01-27: 10 mL

## 2022-01-27 MED ORDER — SODIUM CHLORIDE 0.9 % IV SOLN
10.0000 mg | Freq: Once | INTRAVENOUS | Status: AC
  Administered 2022-01-27: 10 mg via INTRAVENOUS
  Filled 2022-01-27: qty 10

## 2022-01-27 MED ORDER — PALONOSETRON HCL INJECTION 0.25 MG/5ML
0.2500 mg | Freq: Once | INTRAVENOUS | Status: AC
  Administered 2022-01-27: 0.25 mg via INTRAVENOUS
  Filled 2022-01-27: qty 5

## 2022-01-27 MED ORDER — DIPHENHYDRAMINE HCL 25 MG PO CAPS
50.0000 mg | ORAL_CAPSULE | Freq: Once | ORAL | Status: AC
  Administered 2022-01-27: 50 mg via ORAL
  Filled 2022-01-27: qty 2

## 2022-01-27 MED ORDER — SODIUM CHLORIDE 0.9 % IV SOLN
90.0000 mg/m2 | Freq: Once | INTRAVENOUS | Status: AC
  Administered 2022-01-27: 200 mg via INTRAVENOUS
  Filled 2022-01-27: qty 8

## 2022-01-27 MED ORDER — SODIUM CHLORIDE 0.9 % IV SOLN
375.0000 mg/m2 | Freq: Once | INTRAVENOUS | Status: AC
  Administered 2022-01-27: 800 mg via INTRAVENOUS
  Filled 2022-01-27: qty 50

## 2022-01-27 MED ORDER — HEPARIN SOD (PORK) LOCK FLUSH 100 UNIT/ML IV SOLN
500.0000 [IU] | Freq: Once | INTRAVENOUS | Status: AC | PRN
  Administered 2022-01-27: 500 [IU]

## 2022-01-27 MED ORDER — SODIUM CHLORIDE 0.9 % IV SOLN: Freq: Once | INTRAVENOUS | Status: AC

## 2022-01-27 MED ORDER — ACETAMINOPHEN 325 MG PO TABS
650.0000 mg | ORAL_TABLET | Freq: Once | ORAL | Status: AC
  Administered 2022-01-27: 650 mg via ORAL
  Filled 2022-01-27: qty 2

## 2022-01-27 NOTE — Patient Instructions (Signed)
[facility]  Discharge Instructions: Thank you for choosing [facility] to provide your oncology and hematology care.   If you have a lab appointment with the [facility], please go directly to the [facility] and check in at the registration area.  Wear comfortable clothing and clothing appropriate for easy access to any Portacath or PICC line.   We strive to give you quality time with your provider. You may need to reschedule your appointment if you arrive late (15 or more minutes).  Arriving late affects you and other patients whose appointments are after yours.  Also, if you miss three or more appointments without notifying the office, you may be dismissed from the clinic at the provider's discretion.      For prescription refill requests, have your pharmacy contact our office and allow 72 hours for refills to be completed.    Today you received the following chemotherapy and/or immunotherapy agents Rituxan, benkeka    To help prevent nausea and vomiting after your treatment, we encourage you to take your nausea medication as directed.  BELOW ARE SYMPTOMS THAT SHOULD BE REPORTED IMMEDIATELY: *FEVER GREATER THAN 100.4 F (38 C) OR HIGHER *CHILLS OR SWEATING *NAUSEA AND VOMITING THAT IS NOT CONTROLLED WITH YOUR NAUSEA MEDICATION *UNUSUAL SHORTNESS OF BREATH *UNUSUAL BRUISING OR BLEEDING *URINARY PROBLEMS (pain or burning when urinating, or frequent urination) *BOWEL PROBLEMS (unusual diarrhea, constipation, pain near the anus) TENDERNESS IN MOUTH AND THROAT WITH OR WITHOUT PRESENCE OF ULCERS (sore throat, sores in mouth, or a toothache) UNUSUAL RASH, SWELLING OR PAIN  UNUSUAL VAGINAL DISCHARGE OR ITCHING   Items with * indicate a potential emergency and should be followed up as soon as possible or go to the Emergency Department if any problems should occur.  Please show the CHEMOTHERAPY ALERT CARD or IMMUNOTHERAPY ALERT CARD at check-in to  the Emergency Department and triage nurse. Should you have questions after your visit or need to cancel or reschedule your appointment, please contact [facility]  [phone] and follow the prompts.  Office hours are 8:00 a.m. to 4:30 p.m. Monday - Friday. Please note that voicemails left after 4:00 p.m. may not be returned until the following business day.  We are closed weekends and major holidays. You have access to a nurse at all times for urgent questions. Please call the main number to the clinic [phone] and follow the prompts.  For any non-urgent questions, you may also contact your provider using MyChart. We now offer e-Visits for anyone 21 and older to request care online for non-urgent symptoms. For details visit [email or web address].   Also download the MyChart app! Go to the app store, search "MyChart", open the app, select [facility], and log in with your MyChart username and password.  Masks are optional in the cancer centers. If you would like for your care team to wear a mask while they are taking care of you, please let them know. You may have one support person who is at least 69 years old accompany you for your appointments.

## 2022-01-27 NOTE — Progress Notes (Signed)
Approval received for Zofran 8 mg tablets from 01/24/2022-03/21/2023. Auth # [id number].

## 2022-01-27 NOTE — Patient Instructions (Signed)

## 2022-01-28 ENCOUNTER — Inpatient Hospital Stay: Payer: PPO

## 2022-01-28 VITALS — BP 119/61 | HR 65 | Temp 97.9°F | Resp 17

## 2022-01-28 DIAGNOSIS — C8583 Other specified types of non-Hodgkin lymphoma, intra-abdominal lymph nodes: Secondary | ICD-10-CM

## 2022-01-28 DIAGNOSIS — Z5112 Encounter for antineoplastic immunotherapy: Secondary | ICD-10-CM | POA: Diagnosis not present

## 2022-01-28 MED ORDER — SODIUM CHLORIDE 0.9% FLUSH
10.0000 mL | INTRAVENOUS | Status: DC | PRN
  Administered 2022-01-28: 10 mL

## 2022-01-28 MED ORDER — SODIUM CHLORIDE 0.9 % IV SOLN
10.0000 mg | Freq: Once | INTRAVENOUS | Status: AC
  Administered 2022-01-28: 10 mg via INTRAVENOUS
  Filled 2022-01-28: qty 10

## 2022-01-28 MED ORDER — SODIUM CHLORIDE 0.9 % IV SOLN: Freq: Once | INTRAVENOUS | Status: AC

## 2022-01-28 MED ORDER — HEPARIN SOD (PORK) LOCK FLUSH 100 UNIT/ML IV SOLN
500.0000 [IU] | Freq: Once | INTRAVENOUS | Status: AC | PRN
  Administered 2022-01-28: 500 [IU]

## 2022-01-28 MED ORDER — SODIUM CHLORIDE 0.9 % IV SOLN
90.0000 mg/m2 | Freq: Once | INTRAVENOUS | Status: AC
  Administered 2022-01-28: 200 mg via INTRAVENOUS
  Filled 2022-01-28: qty 8

## 2022-01-31 ENCOUNTER — Ambulatory Visit (HOSPITAL_BASED_OUTPATIENT_CLINIC_OR_DEPARTMENT_OTHER)
Admission: RE | Admit: 2022-01-31 | Discharge: 2022-01-31 | Disposition: A | Discharge status: Home / Self Care | Payer: PPO | Source: Ambulatory Visit | Attending: Hematology & Oncology | Admitting: Hematology & Oncology

## 2022-01-31 ENCOUNTER — Other Ambulatory Visit: Payer: Self-pay

## 2022-01-31 ENCOUNTER — Telehealth: Payer: Self-pay

## 2022-01-31 DIAGNOSIS — C8583 Other specified types of non-Hodgkin lymphoma, intra-abdominal lymph nodes: Secondary | ICD-10-CM | POA: Diagnosis not present

## 2022-01-31 DIAGNOSIS — C8590 Non-Hodgkin lymphoma, unspecified, unspecified site: Secondary | ICD-10-CM | POA: Diagnosis not present

## 2022-01-31 DIAGNOSIS — R059 Cough, unspecified: Secondary | ICD-10-CM | POA: Diagnosis not present

## 2022-01-31 DIAGNOSIS — R0602 Shortness of breath: Secondary | ICD-10-CM | POA: Diagnosis not present

## 2022-01-31 DIAGNOSIS — I10 Essential (primary) hypertension: Secondary | ICD-10-CM | POA: Diagnosis not present

## 2022-01-31 NOTE — Telephone Encounter (Signed)
Received call from pt's spouse, with pt speaking in the background reporting pt developed a cough over the weekend and was unable to sleep because of coughing. Max temp 99.4. Covid test was negative although they do report having a Covid exposure over the weekend. Cough is productive although they are unable to report a color d/t discoloration by cough drops. Pt reports being "a little" SOB. Robitussin DM was taken withoug relief. Pt does have Tussinex but they were unsure if it was safe to take on active tx.   Per Dr Marin Olp, ok to take Tussinex. Pt to come in for CXR today. [name] [name] verbalizes understanding and will await call with CXR results. dph

## 2022-02-01 ENCOUNTER — Encounter: Payer: Self-pay | Admitting: Hematology & Oncology

## 2022-02-01 ENCOUNTER — Other Ambulatory Visit: Payer: Self-pay

## 2022-02-01 ENCOUNTER — Emergency Department (HOSPITAL_BASED_OUTPATIENT_CLINIC_OR_DEPARTMENT_OTHER)
Admission: EM | Admit: 2022-02-01 | Discharge: 2022-02-01 | Disposition: A | Discharge status: Home / Self Care | Payer: PPO | Attending: Emergency Medicine | Admitting: Emergency Medicine

## 2022-02-01 ENCOUNTER — Emergency Department (HOSPITAL_BASED_OUTPATIENT_CLINIC_OR_DEPARTMENT_OTHER): Payer: PPO

## 2022-02-01 ENCOUNTER — Other Ambulatory Visit (HOSPITAL_BASED_OUTPATIENT_CLINIC_OR_DEPARTMENT_OTHER): Payer: Self-pay

## 2022-02-01 ENCOUNTER — Telehealth: Payer: Self-pay

## 2022-02-01 ENCOUNTER — Encounter (HOSPITAL_BASED_OUTPATIENT_CLINIC_OR_DEPARTMENT_OTHER): Payer: Self-pay | Admitting: Emergency Medicine

## 2022-02-01 DIAGNOSIS — R509 Fever, unspecified: Secondary | ICD-10-CM | POA: Diagnosis not present

## 2022-02-01 DIAGNOSIS — U071 COVID-19: Secondary | ICD-10-CM | POA: Insufficient documentation

## 2022-02-01 DIAGNOSIS — R9431 Abnormal electrocardiogram [ECG] [EKG]: Secondary | ICD-10-CM | POA: Diagnosis not present

## 2022-02-01 DIAGNOSIS — R059 Cough, unspecified: Secondary | ICD-10-CM | POA: Diagnosis not present

## 2022-02-01 LAB — HEPATIC FUNCTION PANEL
ALT: 21 U/L (ref 0–44)
AST: 21 U/L (ref 15–41)
Albumin: 3.3 g/dL — ABNORMAL LOW (ref 3.5–5.0)
Alkaline Phosphatase: 45 U/L (ref 38–126)
Bilirubin, Direct: 0.1 mg/dL (ref 0.0–0.2)
Indirect Bilirubin: 0.5 mg/dL (ref 0.3–0.9)
Total Bilirubin: 0.6 mg/dL (ref 0.3–1.2)
Total Protein: 7.2 g/dL (ref 6.5–8.1)

## 2022-02-01 LAB — URINALYSIS, ROUTINE W REFLEX MICROSCOPIC
Bilirubin Urine: NEGATIVE
Glucose, UA: NEGATIVE mg/dL
Hgb urine dipstick: NEGATIVE
Ketones, ur: NEGATIVE mg/dL
Leukocytes,Ua: NEGATIVE
Nitrite: NEGATIVE
Protein, ur: 30 mg/dL — AB
Specific Gravity, Urine: 1.025 (ref 1.005–1.030)
pH: 5 (ref 5.0–8.0)

## 2022-02-01 LAB — CBC WITH DIFFERENTIAL/PLATELET
Abs Immature Granulocytes: 0.01 10*3/uL (ref 0.00–0.07)
Basophils Absolute: 0 10*3/uL (ref 0.0–0.1)
Basophils Relative: 0 %
Eosinophils Absolute: 0.1 10*3/uL (ref 0.0–0.5)
Eosinophils Relative: 2 %
HCT: 31.2 % — ABNORMAL LOW (ref 39.0–52.0)
Hemoglobin: 10.4 g/dL — ABNORMAL LOW (ref 13.0–17.0)
Immature Granulocytes: 0 %
Lymphocytes Relative: 2 %
Lymphs Abs: 0.1 10*3/uL — ABNORMAL LOW (ref 0.7–4.0)
MCH: 31.1 pg (ref 26.0–34.0)
MCHC: 33.3 g/dL (ref 30.0–36.0)
MCV: 93.4 fL (ref 80.0–100.0)
Monocytes Absolute: 0.6 10*3/uL (ref 0.1–1.0)
Monocytes Relative: 10 %
Neutro Abs: 4.9 10*3/uL (ref 1.7–7.7)
Neutrophils Relative %: 86 %
Platelets: 136 10*3/uL — ABNORMAL LOW (ref 150–400)
RBC: 3.34 MIL/uL — ABNORMAL LOW (ref 4.22–5.81)
RDW: 13.2 % (ref 11.5–15.5)
WBC: 5.7 10*3/uL (ref 4.0–10.5)
nRBC: 0 % (ref 0.0–0.2)

## 2022-02-01 LAB — BASIC METABOLIC PANEL
Anion gap: 9 (ref 5–15)
BUN: 28 mg/dL — ABNORMAL HIGH (ref 8–23)
CO2: 25 mmol/L (ref 22–32)
Calcium: 8.4 mg/dL — ABNORMAL LOW (ref 8.9–10.3)
Chloride: 101 mmol/L (ref 98–111)
Creatinine, Ser: 1.64 mg/dL — ABNORMAL HIGH (ref 0.61–1.24)
GFR, Estimated: 45 mL/min — ABNORMAL LOW (ref >60)
Glucose, Bld: 100 mg/dL — ABNORMAL HIGH (ref 70–99)
Potassium: 4 mmol/L (ref 3.5–5.1)
Sodium: 135 mmol/L (ref 135–145)

## 2022-02-01 LAB — GROUP A STREP BY PCR: Group A Strep by PCR: NOT DETECTED

## 2022-02-01 LAB — URINALYSIS, MICROSCOPIC (REFLEX): WBC, UA: NONE SEEN WBC/hpf (ref 0–5)

## 2022-02-01 LAB — RESP PANEL BY RT-PCR (FLU A&B, COVID) ARPGX2
Influenza A by PCR: NEGATIVE
Influenza B by PCR: NEGATIVE
SARS Coronavirus 2 by RT PCR: POSITIVE — AB

## 2022-02-01 LAB — LACTIC ACID, PLASMA: Lactic Acid, Venous: 0.6 mmol/L (ref 0.5–1.9)

## 2022-02-01 MED ORDER — NIRMATRELVIR/RITONAVIR (PAXLOVID) TABLET (RENAL DOSING)
2.0000 | ORAL_TABLET | Freq: Two times a day (BID) | ORAL | 0 refills | Status: AC
  Filled 2022-02-01: qty 20, 5d supply, fill #0

## 2022-02-01 MED ORDER — NIRMATRELVIR/RITONAVIR (PAXLOVID) TABLET (RENAL DOSING): 2.0000 | ORAL_TABLET | Freq: Two times a day (BID) | ORAL | Status: DC

## 2022-02-01 MED ORDER — HEPARIN SOD (PORK) LOCK FLUSH 100 UNIT/ML IV SOLN
500.0000 [IU] | Freq: Once | INTRAVENOUS | Status: AC
  Administered 2022-02-01: 500 [IU]
  Filled 2022-02-01: qty 5

## 2022-02-01 NOTE — ED Notes (Signed)
Attempting [place] IV with Paramedic Gerald Stabs for culture draws.

## 2022-02-01 NOTE — ED Provider Notes (Signed)
[facility] EMERGENCY DEPARTMENT Provider Note   CSN: [id number] Arrival date & time: 02/01/22  1412     History  Chief Complaint  Patient presents with   Cough   Fever    [name] [name] is a 71 y.o. male.  Patient with history of marginal zone lymphoma, on active chemotherapy last given 01/28/2022 --presents to the emergency department for cough and fever symptoms.  Patient had a temperature documented at home of 100.4 F earlier today.  He reports some nasal congestion and scratchy throat.  He took a home COVID test last night which was negative.  States that he did have a sick contact who tested positive for COVID during a family gathering over the weekend (today is Tuesday).  No nausea, vomiting, or diarrhea.  No urinary symptoms.  No skin rashes.  He does have a port in the right chest which is nontender and has not had any skin changes associated with it.  He had a chest x-ray yesterday.       Home Medications Prior to Admission medications   Medication Sig Start Date End Date Taking? Authorizing Provider  chlorpheniramine-HYDROcodone (TUSSIONEX) 10-8 MG/5ML Take 5 mLs by mouth every 12 (twelve) hours as needed for cough. Patient not taking: Reported on 01/18/2022 12/16/21   Binnie Rail, MD  dexamethasone (DECADRON) 4 MG tablet Take 2 tablets (8 mg total) by mouth daily. Start the day after bendamustine chemotherapy for 2 days. Take with food. 01/20/22   [name], MD  famciclovir (FAMVIR) 250 MG tablet Take 1 tablet (250 mg total) by mouth daily. 01/18/22   [name], MD  furosemide (LASIX) 40 MG tablet Take 40 mg by mouth daily as needed. 05/04/20   [provider]  hydrALAZINE (APRESOLINE) 100 MG tablet TAKE 1 TABLET BY MOUTH THREE TIMES A DAY 11/23/21   Martinique, Peter M, MD  levothyroxine (SYNTHROID) 150 MCG tablet TAKE 1 TABLET BY MOUTH EVERY DAY 12/30/21   Binnie Rail, MD  lidocaine-prilocaine (EMLA) cream Apply to affected area once  01/20/22   [name], MD  Melatonin 10 MG SUBL Place 10 mg under the tongue as needed.    [provider]  Multiple Vitamins-Minerals (CENTRUM SILVER 50+MEN PO) Take by mouth.    [provider]  ondansetron (ZOFRAN) 8 MG tablet Take 1 tablet (8 mg total) by mouth every 8 (eight) hours as needed for nausea or vomiting. Start on the third day after chemotherapy. 01/20/22   [name], MD  prochlorperazine (COMPAZINE) 10 MG tablet Take 1 tablet (10 mg total) by mouth every 6 (six) hours as needed for nausea or vomiting. 01/20/22   [name], MD  rosuvastatin (CRESTOR) 20 MG tablet TAKE 1 TABLET BY MOUTH EVERY DAY 11/23/21   Binnie Rail, MD  venlafaxine XR (EFFEXOR-XR) 37.5 MG 24 hr capsule Take 1 capsule (37.5 mg total) by mouth daily with breakfast. 01/18/22   [name], MD      Allergies    Patient has no known allergies.    Review of Systems   Review of Systems  Physical Exam Updated Vital Signs BP 131/62   Pulse 88   Temp 98.8 F (37.1 C)   Resp 19   Ht '5\' 11"'$  (1.803 m)   Wt 88.5 kg   SpO2 94%   BMI 27.20 kg/m  Physical Exam Vitals and nursing note reviewed.  Constitutional:      General: He  is not in acute distress.    Appearance: He is well-developed.  HENT:     Head: Normocephalic and atraumatic.     Right Ear: Tympanic membrane, ear canal and external ear normal.     Left Ear: Tympanic membrane, ear canal and external ear normal.     Nose: Congestion present.     Mouth/Throat:     Mouth: Mucous membranes are moist.     Pharynx: No oropharyngeal exudate or posterior oropharyngeal erythema.  Eyes:     General:        Right eye: No discharge.        Left eye: No discharge.     Conjunctiva/sclera: Conjunctivae normal.  Cardiovascular:     Rate and Rhythm: Normal rate and regular rhythm.     Heart sounds: Normal heart sounds.     Comments: Port noted right upper chest wall. Pulmonary:     Effort: Pulmonary effort is normal.      Breath sounds: Wheezing present.     Comments: Minimal scant expiratory wheeze, bilateral bases Abdominal:     Palpations: Abdomen is soft.     Tenderness: There is no abdominal tenderness. There is no guarding or rebound.  Musculoskeletal:     Cervical back: Normal range of motion and neck supple.     Right lower leg: No edema.     Left lower leg: No edema.  Skin:    General: Skin is warm and dry.  Neurological:     General: No focal deficit present.     Mental Status: He is alert.     ED Results / Procedures / Treatments   Labs (all labs ordered are listed, but only abnormal results are displayed) Labs Reviewed  RESP PANEL BY RT-PCR (FLU A&B, COVID) ARPGX2 - Abnormal; Notable for the following components:      Result Value   SARS Coronavirus 2 by RT PCR POSITIVE (*)    All other components within normal limits  CBC WITH DIFFERENTIAL/PLATELET - Abnormal; Notable for the following components:   RBC 3.34 (*)    Hemoglobin 10.4 (*)    HCT 31.2 (*)    Platelets 136 (*)    Lymphs Abs 0.1 (*)    All other components within normal limits  BASIC METABOLIC PANEL - Abnormal; Notable for the following components:   Glucose, Bld 100 (*)    BUN 28 (*)    Creatinine, Ser 1.64 (*)    Calcium 8.4 (*)    GFR, Estimated 45 (*)    All other components within normal limits  HEPATIC FUNCTION PANEL - Abnormal; Notable for the following components:   Albumin 3.3 (*)    All other components within normal limits  URINALYSIS, ROUTINE W REFLEX MICROSCOPIC - Abnormal; Notable for the following components:   Protein, ur 30 (*)    All other components within normal limits  URINALYSIS, MICROSCOPIC (REFLEX) - Abnormal; Notable for the following components:   Bacteria, UA RARE (*)    All other components within normal limits  GROUP A STREP BY PCR  CULTURE, BLOOD (ROUTINE X 2)  CULTURE, BLOOD (ROUTINE X 2)  LACTIC ACID, PLASMA    EKG EKG Interpretation  Date/Time:  Tuesday February 01 2022 14:42:17 EST Ventricular Rate:  86 PR Interval:  192 QRS Duration: 85 QT Interval:  342 QTC Calculation: 409 R Axis:   41 Text Interpretation: Sinus rhythm Confirmed by Fredia Sorrow [phone]) on 02/01/2022 3:19:38 PM  Radiology DG Chest Portable  1 View  Result Date: 02/01/2022 CLINICAL DATA:  Cough, fever. EXAM: PORTABLE CHEST 1 VIEW COMPARISON:  January 31, 2022. FINDINGS: The heart size and mediastinal contours are within normal limits. Right internal jugular Port-A-Cath is unchanged in position. Both lungs are clear. The visualized skeletal structures are unremarkable. IMPRESSION: No active disease. Electronically Signed   By: Marijo Conception M.D.   On: 02/01/2022 15:44    Procedures Procedures    Medications Ordered in ED Medications - No data to display  ED Course/ Medical Decision Making/ A&P    Patient seen and examined. History obtained directly from patient.  Reviewed recent oncology notes.  Personally reviewed and interpreted chest x-ray images performed yesterday which does not show a dense infiltrate.  Labs/EKG: Ordered CBC, CMP, lactate, COVID, strep ordered in triage  Imaging: Ordered chest 1 view.  Medications/Fluids: None ordered.  No tachycardia, hypoxia.  Most recent vital signs reviewed and are as follows: BP 131/62   Pulse 88   Temp 98.8 F (37.1 C)   Resp 19   Ht '5\' 11"'$  (1.803 m)   Wt 88.5 kg   SpO2 94%   BMI 27.20 kg/m   Initial impression: Viral respiratory illness with fever, recent chemo.  5:35 PM Reassessment performed. Patient appears stable during multiple rechecks and updates.  Labs personally reviewed and interpreted including: CBC with normal white blood cell count, slightly low hemoglobin at 10.4, platelets 136, low absolute lymphocytes; BMP with stable creatinine 1.64 and BUN the 28, normal electrolytes; lactate 0.6; negative strep; positive COVID, negative flu; UA without signs of infection, blood cultures pending.  Imaging  personally visualized and interpreted including: Chest x-ray, agree no development of pneumonia.  Reviewed pertinent lab work and imaging with patient at bedside. Questions answered.   Most current vital signs reviewed and are as follows: BP (!) 148/70   Pulse 78   Temp 98.8 F (37.1 C)   Resp 18   Ht '5\' 11"'$  (1.803 m)   Wt 88.5 kg   SpO2 96%   BMI 27.20 kg/m   Plan: Discharge to home.  I have consulted with NP with Dr. Marin Olp and reviewed results.  They are comfortable with patient being discharged home with treatment for COVID.  Per pharmacist here, patient is to hold Crestor while taking Paxlovid.  Discussed nausea and vomiting side effects with patient and wife at bedside.  He has antiemetics at home.  Patient's wife was able to pick up the prescription from the pharmacy here prior to discharge.  He will have it to take tonight.   Detailed discussion had with with patient regarding COVID-19 precautions and written instructions given as well.  We discussed need to isolate themselves for 5 days from onset of symptoms and have 24 hours of improvement prior to breaking isolation.  We discussed that when breaking isolation, mask wearing for 5 additional days is required.  We discussed signs symptoms to return which include worsening shortness of breath, trouble breathing, or increased work of breathing.  Also return with persistent vomiting, confusion, passing out, or if they have any other concerns. Counseled on the need for rest and good hydration. Discussed that high-risk contacts should be aware of positive result and they need to quarantine and be tested if they develop any symptoms. Patient verbalizes understanding.   [name] [name] was evaluated in Emergency Department on 02/01/2022 for the symptoms described in the history of present illness. He was evaluated in the context of the global  COVID-19 pandemic, which necessitated consideration that the patient might be at risk for  infection with the SARS-CoV-2 virus that causes COVID-19. Institutional protocols and algorithms that pertain to the evaluation of patients at risk for COVID-19 are in a state of rapid change based on information released by regulatory bodies including the CDC and federal and state organizations. These policies and algorithms were followed during the patient's care in the ED.                            Medical Decision Making  Patient with recent chemotherapy presents with febrile illness and cough.  Sepsis work-up is very reassuring.  No concerns for pneumonia, UTI at this time.  He is positive for COVID.  Negative for flu.  He appears well, nontoxic.  Do not suspect complication or line infection from his port.  Overall respiratory symptoms plus positive COVID is likely etiology.  No indications for admission at this time.  Patient has close follow-up with his oncologist, who is aware and involved with current plan.  The patient's vital signs, pertinent lab work and imaging were reviewed and interpreted as discussed in the ED course. Hospitalization was considered for further testing, treatments, or serial exams/observation. However as patient is well-appearing, has a stable exam, and reassuring studies today, I do not feel that they warrant admission at this time. This plan was discussed with the patient who verbalizes agreement and comfort with this plan and seems reliable and able to return to the Emergency Department with worsening or changing symptoms.          Final Clinical Impression(s) / ED Diagnoses Final diagnoses:  [id number]    Rx / DC Orders ED Discharge Orders          Ordered    nirmatrelvir/ritonavir EUA, renal dosing, (PAXLOVID) 10 x 150 MG & 10 x '100MG'$  TABS  2 times daily        02/01/22 1656              Carlisle Cater, PA-C 02/01/22 1741    Fredia Sorrow, MD 02/02/22 [phone]

## 2022-02-01 NOTE — Discharge Instructions (Addendum)
Please read and follow all provided instructions.  Your diagnoses today include:  1. COVID-19     Tests performed today include: Vital signs. See below for your results today.  Complete blood cell count: low lymphocytes, normal total white blood cell count Complete metabolic panel: stable kidney function Urinalysis (urine test):  COVID test positive Flu test negative  Medications prescribed:  Paxlovid: Medication for COVID, this can cause GI upset so use your nausea medication as needed.  Do not take your Crestor while taking this medication.  Take any prescribed medications only as directed. Treatment for your infection is aimed at treating the symptoms. There are no medications, such as antibiotics, that will cure your infection.   Home care instructions:  Follow any educational materials contained in this packet.   Your illness is contagious and can be spread to others, especially during the first 3 or 4 days. It cannot be cured by antibiotics or other medicines. Take basic precautions such as washing your hands often, covering your mouth when you cough or sneeze, and avoiding public places where you could spread your illness to others.   Please continue drinking plenty of fluids.  Use over-the-counter medicines as needed as directed on packaging for symptom relief.  You may also use ibuprofen or tylenol as directed on packaging for pain or fever.  Do not take multiple medicines containing Tylenol or acetaminophen to avoid taking too much of this medication.  If you are positive for Covid-19, you should isolate yourself and not be exposed to other people for 5 days after your symptoms began. If you are not feeling better at day 5, you need to isolate yourself for a total of 10 days. If you are feeling better by day 5, you should wear a mask properly, over your nose and mouth, at all times while around other people until 10 days after your symptoms started.   Follow-up  instructions: Please follow-up with your primary care provider as needed for further evaluation of your symptoms if you are not feeling better.   Return instructions:  Please return to the Emergency Department if you experience worsening symptoms.  Return to the emergency department if you have worsening shortness of breath breathing or increased work of breathing, persistent vomiting RETURN IMMEDIATELY IF you develop shortness of breath, confusion or altered mental status, a new rash, become dizzy, faint, or poorly responsive, or are unable to be cared for at home. Please return if you have persistent vomiting and cannot keep down fluids or develop a fever that is not controlled by tylenol or motrin.   Please return if you have any other emergent concerns.  Additional Information:  Your vital signs today were: BP 131/69   Pulse 86   Temp 98.8 F (37.1 C)   Resp 20   Ht '5\' 11"'$  (1.803 m)   Wt 88.5 kg   SpO2 97%   BMI 27.20 kg/m  If your blood pressure (BP) was elevated above 135/85 this visit, please have this repeated by your doctor within one month. --------------

## 2022-02-01 NOTE — ED Triage Notes (Signed)
Chemo Thursday and Friday. Since has had new cough, scratchy throat, and new fever. Also endorses fatigue. Has chest XR yesterday, but radiology hasn't read it yet.

## 2022-02-01 NOTE — Telephone Encounter (Signed)
Patients wife called stating pts fever is now up to 100.4 and he is lethargic. Discussed with Gillian Shields who recommends based on symptoms and recent chemo infusion on 11/10 pt should go to ED. Informed pts wife and she will bring him to ED.

## 2022-02-02 ENCOUNTER — Telehealth: Payer: Self-pay

## 2022-02-02 NOTE — Telephone Encounter (Addendum)
Patient has since been evaluated in the ED and dx with COVID. Pt started on Paxlovid. Dr Marin Olp aware. No need for the scripts below per MD. dph  ----- Message from [name], MD sent at 02/01/2022  7:35 PM EST ----- Call - the CXR looks ok.  No pneumonia.  If the cough is persistent,  let's give hm a Z-Pak and Medrol Dose Pack.   Laurey Arrow

## 2022-02-06 LAB — CULTURE, BLOOD (ROUTINE X 2)
Culture: NO GROWTH
Special Requests: ADEQUATE

## 2022-02-08 ENCOUNTER — Ambulatory Visit (HOSPITAL_COMMUNITY): Payer: PPO

## 2022-02-09 ENCOUNTER — Other Ambulatory Visit: Payer: Self-pay | Admitting: Hematology & Oncology

## 2022-02-17 NOTE — Progress Notes (Signed)
Pharmacist Chemotherapy Monitoring - Initial Assessment     Cycle 2 today, leave rituximab as a standard infusion today per Dr. Marin Olp.   Claybon Jabs, [facility], 02/17/2022  7:53 AM

## 2022-02-23 DIAGNOSIS — I129 Hypertensive chronic kidney disease with stage 1 through stage 4 chronic kidney disease, or unspecified chronic kidney disease: Secondary | ICD-10-CM | POA: Diagnosis not present

## 2022-02-23 DIAGNOSIS — E875 Hyperkalemia: Secondary | ICD-10-CM | POA: Diagnosis not present

## 2022-02-23 DIAGNOSIS — R42 Dizziness and giddiness: Secondary | ICD-10-CM | POA: Diagnosis not present

## 2022-02-23 DIAGNOSIS — R55 Syncope and collapse: Secondary | ICD-10-CM | POA: Diagnosis not present

## 2022-02-23 DIAGNOSIS — R809 Proteinuria, unspecified: Secondary | ICD-10-CM | POA: Diagnosis not present

## 2022-02-23 DIAGNOSIS — C8580 Other specified types of non-Hodgkin lymphoma, unspecified site: Secondary | ICD-10-CM | POA: Diagnosis not present

## 2022-02-23 DIAGNOSIS — N1831 Chronic kidney disease, stage 3a: Secondary | ICD-10-CM | POA: Diagnosis not present

## 2022-02-24 ENCOUNTER — Inpatient Hospital Stay: Payer: PPO

## 2022-02-24 ENCOUNTER — Inpatient Hospital Stay: Payer: PPO | Attending: Hematology & Oncology

## 2022-02-24 ENCOUNTER — Encounter: Payer: Self-pay | Admitting: Hematology & Oncology

## 2022-02-24 ENCOUNTER — Inpatient Hospital Stay (HOSPITAL_BASED_OUTPATIENT_CLINIC_OR_DEPARTMENT_OTHER): Payer: PPO | Admitting: Hematology & Oncology

## 2022-02-24 ENCOUNTER — Other Ambulatory Visit: Payer: Self-pay

## 2022-02-24 VITALS — BP 132/65 | HR 73 | Temp 97.4°F | Resp 17

## 2022-02-24 VITALS — BP 134/69 | HR 82 | Temp 98.1°F | Resp 18 | Ht 71.0 in | Wt 200.0 lb

## 2022-02-24 DIAGNOSIS — Z5111 Encounter for antineoplastic chemotherapy: Secondary | ICD-10-CM | POA: Insufficient documentation

## 2022-02-24 DIAGNOSIS — C8583 Other specified types of non-Hodgkin lymphoma, intra-abdominal lymph nodes: Secondary | ICD-10-CM

## 2022-02-24 DIAGNOSIS — Z5112 Encounter for antineoplastic immunotherapy: Secondary | ICD-10-CM | POA: Insufficient documentation

## 2022-02-24 LAB — CMP (CANCER CENTER ONLY)
ALT: 18 U/L (ref 0–44)
AST: 22 U/L (ref 15–41)
Albumin: 3.4 g/dL — ABNORMAL LOW (ref 3.5–5.0)
Alkaline Phosphatase: 55 U/L (ref 38–126)
Anion gap: 7 (ref 5–15)
BUN: 25 mg/dL — ABNORMAL HIGH (ref 8–23)
CO2: 26 mmol/L (ref 22–32)
Calcium: 8.9 mg/dL (ref 8.9–10.3)
Chloride: 104 mmol/L (ref 98–111)
Creatinine: 1.39 mg/dL — ABNORMAL HIGH (ref 0.61–1.24)
GFR, Estimated: 55 mL/min — ABNORMAL LOW (ref >60)
Glucose, Bld: 85 mg/dL (ref 70–99)
Potassium: 3.8 mmol/L (ref 3.5–5.1)
Sodium: 137 mmol/L (ref 135–145)
Total Bilirubin: 0.5 mg/dL (ref 0.3–1.2)
Total Protein: 7.5 g/dL (ref 6.5–8.1)

## 2022-02-24 LAB — CBC WITH DIFFERENTIAL (CANCER CENTER ONLY)
Abs Immature Granulocytes: 0.02 10*3/uL (ref 0.00–0.07)
Basophils Absolute: 0 10*3/uL (ref 0.0–0.1)
Basophils Relative: 1 %
Eosinophils Absolute: 0.1 10*3/uL (ref 0.0–0.5)
Eosinophils Relative: 2 %
HCT: 30.4 % — ABNORMAL LOW (ref 39.0–52.0)
Hemoglobin: 9.9 g/dL — ABNORMAL LOW (ref 13.0–17.0)
Immature Granulocytes: 0 %
Lymphocytes Relative: 14 %
Lymphs Abs: 0.6 10*3/uL — ABNORMAL LOW (ref 0.7–4.0)
MCH: 30.7 pg (ref 26.0–34.0)
MCHC: 32.6 g/dL (ref 30.0–36.0)
MCV: 94.1 fL (ref 80.0–100.0)
Monocytes Absolute: 0.9 10*3/uL (ref 0.1–1.0)
Monocytes Relative: 20 %
Neutro Abs: 2.9 10*3/uL (ref 1.7–7.7)
Neutrophils Relative %: 63 %
Platelet Count: 120 10*3/uL — ABNORMAL LOW (ref 150–400)
RBC: 3.23 MIL/uL — ABNORMAL LOW (ref 4.22–5.81)
RDW: 13 % (ref 11.5–15.5)
WBC Count: 4.6 10*3/uL (ref 4.0–10.5)
nRBC: 0 % (ref 0.0–0.2)

## 2022-02-24 LAB — LACTATE DEHYDROGENASE: LDH: 136 U/L (ref 98–192)

## 2022-02-24 LAB — URIC ACID: Uric Acid, Serum: 5.9 mg/dL (ref 3.7–8.6)

## 2022-02-24 MED ORDER — ALTEPLASE 2 MG IJ SOLR: 2.0000 mg | Freq: Once | INTRAMUSCULAR | Status: DC | PRN

## 2022-02-24 MED ORDER — PALONOSETRON HCL INJECTION 0.25 MG/5ML
0.2500 mg | Freq: Once | INTRAVENOUS | Status: AC
  Administered 2022-02-24: 0.25 mg via INTRAVENOUS
  Filled 2022-02-24: qty 5

## 2022-02-24 MED ORDER — HEPARIN SOD (PORK) LOCK FLUSH 100 UNIT/ML IV SOLN
500.0000 [IU] | Freq: Once | INTRAVENOUS | Status: AC | PRN
  Administered 2022-02-24: 500 [IU]

## 2022-02-24 MED ORDER — SODIUM CHLORIDE 0.9 % IV SOLN
10.0000 mg | Freq: Once | INTRAVENOUS | Status: AC
  Administered 2022-02-24: 10 mg via INTRAVENOUS
  Filled 2022-02-24: qty 10

## 2022-02-24 MED ORDER — SODIUM CHLORIDE 0.9 % IV SOLN
90.0000 mg/m2 | Freq: Once | INTRAVENOUS | Status: AC
  Administered 2022-02-24: 200 mg via INTRAVENOUS
  Filled 2022-02-24: qty 8

## 2022-02-24 MED ORDER — DIPHENHYDRAMINE HCL 25 MG PO CAPS
50.0000 mg | ORAL_CAPSULE | Freq: Once | ORAL | Status: AC
  Administered 2022-02-24: 50 mg via ORAL
  Filled 2022-02-24: qty 2

## 2022-02-24 MED ORDER — COLD PACK MISC ONCOLOGY: 1.0000 | Freq: Once | Status: DC | PRN

## 2022-02-24 MED ORDER — ACETAMINOPHEN 325 MG PO TABS
650.0000 mg | ORAL_TABLET | Freq: Once | ORAL | Status: AC
  Administered 2022-02-24: 650 mg via ORAL
  Filled 2022-02-24: qty 2

## 2022-02-24 MED ORDER — SODIUM CHLORIDE 0.9% FLUSH
10.0000 mL | INTRAVENOUS | Status: DC | PRN
  Administered 2022-02-24: 10 mL

## 2022-02-24 MED ORDER — SODIUM CHLORIDE 0.9 % IV SOLN: Freq: Once | INTRAVENOUS | Status: AC

## 2022-02-24 MED ORDER — SODIUM CHLORIDE 0.9 % IV SOLN
375.0000 mg/m2 | Freq: Once | INTRAVENOUS | Status: AC
  Administered 2022-02-24: 800 mg via INTRAVENOUS
  Filled 2022-02-24: qty 50

## 2022-02-24 NOTE — Patient Instructions (Signed)
[facility]  Discharge Instructions: Thank you for choosing [facility] to provide your oncology and hematology care.   If you have a lab appointment with the [facility], please go directly to the [facility] and check in at the registration area.  Wear comfortable clothing and clothing appropriate for easy access to any Portacath or PICC line.   We strive to give you quality time with your provider. You may need to reschedule your appointment if you arrive late (15 or more minutes).  Arriving late affects you and other patients whose appointments are after yours.  Also, if you miss three or more appointments without notifying the office, you may be dismissed from the clinic at the provider's discretion.      For prescription refill requests, have your pharmacy contact our office and allow 72 hours for refills to be completed.    Today you received the following chemotherapy and/or immunotherapy agents Rituxan, Bendek[place].      To help prevent nausea and vomiting after your treatment, we encourage you to take your nausea medication as directed.  BELOW ARE SYMPTOMS THAT SHOULD BE REPORTED IMMEDIATELY: *FEVER GREATER THAN 100.4 F (38 C) OR HIGHER *CHILLS OR SWEATING *NAUSEA AND VOMITING THAT IS NOT CONTROLLED WITH YOUR NAUSEA MEDICATION *UNUSUAL SHORTNESS OF BREATH *UNUSUAL BRUISING OR BLEEDING *URINARY PROBLEMS (pain or burning when urinating, or frequent urination) *BOWEL PROBLEMS (unusual diarrhea, constipation, pain near the anus) TENDERNESS IN MOUTH AND THROAT WITH OR WITHOUT PRESENCE OF ULCERS (sore throat, sores in mouth, or a toothache) UNUSUAL RASH, SWELLING OR PAIN  UNUSUAL VAGINAL DISCHARGE OR ITCHING   Items with * indicate a potential emergency and should be followed up as soon as possible or go to the Emergency Department if any problems should occur.  Please show the CHEMOTHERAPY ALERT CARD or IMMUNOTHERAPY ALERT CARD at check-in  to the Emergency Department and triage nurse. Should you have questions after your visit or need to cancel or reschedule your appointment, please contact [facility]  [phone] and follow the prompts.  Office hours are 8:00 a.m. to 4:30 p.m. Monday - Friday. Please note that voicemails left after 4:00 p.m. may not be returned until the following business day.  We are closed weekends and major holidays. You have access to a nurse at all times for urgent questions. Please call the main number to the clinic [phone] and follow the prompts.  For any non-urgent questions, you may also contact your provider using MyChart. We now offer e-Visits for anyone 77 and older to request care online for non-urgent symptoms. For details visit [email or web address].   Also download the MyChart app! Go to the app store, search "MyChart", open the app, select [facility], and log in with your MyChart username and password.  Masks are optional in the cancer centers. If you would like for your care team to wear a mask while they are taking care of you, please let them know. You may have one support person who is at least 69 years old accompany you for your appointments.

## 2022-02-24 NOTE — Patient Instructions (Signed)

## 2022-02-24 NOTE — Progress Notes (Signed)
Hematology and Oncology Follow Up Visit  [name] [name] [id number] 28-Jun-1950 71 y.o. 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  He tolerated his first cycle of treatment pretty well.  Unfortunate, he did have COVID about a week or so afterwards.  Had a temperature.  He went to the emergency room.  He tested positive for COVID.  He was given Paxlovid which did seem to help.  Currently, he feels okay.  Has little bit of discomfort over in his left side.  He has had no cough or shortness of breath.  He has had no diarrhea.  He has had no bleeding.  Has little bit of leg swelling, more so on the right leg than left leg.  This is chronic.  Only initiated treatment, his monoclonal spike was 1.6 g/dL.  His IgM level was 2500 mg/dL.  The Kappa light chain was 28.1 mg/dL.  There is been no rashes.  There is been no bleeding.  He has had no problems with headache.  Overall, I was his performance status is probably ECOG 1.   Medications:  Current Outpatient Medications:    dexamethasone (DECADRON) 4 MG tablet, Take 2 tablets (8 mg total) by mouth daily. Start the day after bendamustine chemotherapy for 2 days. Take with food., Disp: 30 tablet, Rfl: 1   famciclovir (FAMVIR) 250 MG tablet, Take 1 tablet (250 mg total) by mouth daily., Disp: 30 tablet, Rfl: 12   hydrALAZINE (APRESOLINE) 100 MG tablet, TAKE 1 TABLET BY MOUTH THREE TIMES A DAY, Disp: 270 tablet, Rfl: 1   levothyroxine (SYNTHROID) 150 MCG tablet, TAKE 1 TABLET BY MOUTH EVERY DAY, Disp: 90 tablet, Rfl: 1   lidocaine-prilocaine (EMLA) cream, Apply to affected area once, Disp: 30 g, Rfl: 3    Melatonin 10 MG SUBL, Place 10 mg under the tongue as needed., Disp: , Rfl:    Multiple Vitamins-Minerals (CENTRUM SILVER 50+MEN PO), Take by mouth., Disp: , Rfl:    ondansetron (ZOFRAN) 8 MG tablet, Take 1 tablet (8 mg total) by mouth every 8 (eight) hours as needed for nausea or vomiting. Start on the third day after chemotherapy., Disp: 30 tablet, Rfl: 1   prochlorperazine (COMPAZINE) 10 MG tablet, Take 1 tablet (10 mg total) by mouth every 6 (six) hours as needed for nausea or vomiting., Disp: 30 tablet, Rfl: 1   rosuvastatin (CRESTOR) 20 MG tablet, TAKE 1 TABLET BY MOUTH EVERY DAY, Disp: 90 tablet, Rfl: 1   venlafaxine XR (EFFEXOR-XR) 37.5 MG 24 hr capsule, TAKE 1 CAPSULE BY MOUTH DAILY WITH BREAKFAST., Disp: 90 capsule, Rfl: 2   furosemide (LASIX) 40 MG tablet, Take 40 mg by mouth daily as needed. (Patient not taking: Reported on 02/24/2022), Disp: , Rfl:   Allergies: No Known Allergies  Past Medical History, Surgical history, Social history, and Family History were reviewed and updated.  Review of Systems: Review of Systems  Constitutional: Negative.   HENT:  Negative.    Eyes: Negative.   Respiratory: Negative.    Cardiovascular: Negative.   Gastrointestinal:  Positive for abdominal pain and nausea.  Endocrine: Negative.  Genitourinary: Negative.    Musculoskeletal: Negative.   Skin: Negative.   Neurological: Negative.   Hematological: Negative.   Psychiatric/Behavioral: Negative.      Physical Exam:  height is '5\' 11"'$  (1.803 m) and weight is 200 lb (90.7 kg). His oral temperature is 98.1 F (36.7 C). His blood pressure is 134/69 and his pulse is 82. His respiration is 18 and oxygen saturation is 96%.   Wt Readings from Last 3 Encounters:  02/24/22 200 lb (90.7 kg)  02/01/22 195 lb (88.5 kg)  01/18/22 198 lb 1.9 oz (89.9 kg)    Physical Exam Vitals reviewed.  HENT:     Head: Normocephalic and atraumatic.  Eyes:     Pupils: Pupils are equal, round, and reactive to  light.  Cardiovascular:     Rate and Rhythm: Normal rate and regular rhythm.     Heart sounds: Normal heart sounds.  Pulmonary:     Effort: Pulmonary effort is normal.     Breath sounds: Normal breath sounds.  Abdominal:     General: Bowel sounds are normal.     Palpations: Abdomen is soft.  Musculoskeletal:        General: No tenderness or deformity. Normal range of motion.     Cervical back: Normal range of motion.  Lymphadenopathy:     Cervical: No cervical adenopathy.  Skin:    General: Skin is warm and dry.     Findings: No erythema or rash.  Neurological:     Mental Status: He is alert and oriented to person, place, and time.  Psychiatric:        Behavior: Behavior normal.        Thought Content: Thought content normal.        Judgment: Judgment normal.     Lab Results  Component Value Date   WBC 4.6 02/24/2022   HGB 9.9 (L) 02/24/2022   HCT 30.4 (L) 02/24/2022   MCV 94.1 02/24/2022   PLT 120 (L) 02/24/2022     Chemistry      Component Value Date/Time   NA 135 02/01/2022 1458   NA 138 12/06/2019 1501   NA 142 04/25/2016 0844   NA 140 10/19/2015 0903   K 4.0 02/01/2022 1458   K 4.1 04/25/2016 0844   K 4.1 10/19/2015 0903   CL 101 02/01/2022 1458   CL 102 04/25/2016 0844   CO2 25 02/01/2022 1458   CO2 29 04/25/2016 0844   CO2 25 10/19/2015 0903   BUN 28 (H) 02/01/2022 1458   BUN 20 12/06/2019 1501   BUN 14 04/25/2016 0844   BUN 21.6 10/19/2015 0903   CREATININE 1.64 (H) 02/01/2022 1458   CREATININE 1.46 (H) 01/27/2022 0811   CREATININE 1.1 04/25/2016 0844   CREATININE 1.3 10/19/2015 0903      Component Value Date/Time   CALCIUM 8.4 (L) 02/01/2022 1458   CALCIUM 9.6 04/25/2016 0844   CALCIUM 9.4 10/19/2015 0903   ALKPHOS 45 02/01/2022 1514   ALKPHOS 59 04/25/2016 0844   ALKPHOS 68 10/19/2015 0903   AST 21 02/01/2022 1514   AST 17 01/27/2022 0811   AST 21 10/19/2015 0903   ALT 21 02/01/2022 1514   ALT 13 01/27/2022 0811   ALT 27 04/25/2016 0844    ALT 19 10/19/2015 0903   BILITOT 0.6 02/01/2022 1514   BILITOT 0.4 01/27/2022 0811   BILITOT 0.54 10/19/2015 0903      Impression and Plan: [name] [name] is a 71 year old white male.  We treated him 10 years ago.  He had a marginal zone lymphoma.  He went into clinical remission.  When he initially presented, he had splenomegaly.  When he relapsed, he again had splenomegaly.  We subsequently treated him with Imbruvica and venetoclax.  He completed his treatments back in April.  We now have him on Rituxan/Bendamustine.  Hopefully, this will get this back in remission.  We will see what his IgM level is.  We will see what the Kappa light chain level is.  I hate that he had COVID.  He has had COVID several times.  For now, we will have to see what his labs look like.  I am awaiting his renal studies.  We will plan to get him back after the new year for his third cycle of treatment.     [name], MD 12/7/20239:00 AM

## 2022-02-25 ENCOUNTER — Inpatient Hospital Stay: Payer: PPO

## 2022-02-25 VITALS — BP 140/70 | HR 87 | Temp 99.3°F | Resp 18

## 2022-02-25 DIAGNOSIS — C8583 Other specified types of non-Hodgkin lymphoma, intra-abdominal lymph nodes: Secondary | ICD-10-CM

## 2022-02-25 DIAGNOSIS — Z5112 Encounter for antineoplastic immunotherapy: Secondary | ICD-10-CM | POA: Diagnosis not present

## 2022-02-25 MED ORDER — SODIUM CHLORIDE 0.9 % IV SOLN
90.0000 mg/m2 | Freq: Once | INTRAVENOUS | Status: AC
  Administered 2022-02-25: 200 mg via INTRAVENOUS
  Filled 2022-02-25: qty 8

## 2022-02-25 MED ORDER — HEPARIN SOD (PORK) LOCK FLUSH 100 UNIT/ML IV SOLN
500.0000 [IU] | Freq: Once | INTRAVENOUS | Status: AC | PRN
  Administered 2022-02-25: 500 [IU]

## 2022-02-25 MED ORDER — SODIUM CHLORIDE 0.9% FLUSH
10.0000 mL | INTRAVENOUS | Status: DC | PRN
  Administered 2022-02-25: 10 mL

## 2022-02-25 MED ORDER — SODIUM CHLORIDE 0.9 % IV SOLN: Freq: Once | INTRAVENOUS | Status: AC

## 2022-02-25 MED ORDER — SODIUM CHLORIDE 0.9 % IV SOLN
10.0000 mg | Freq: Once | INTRAVENOUS | Status: AC
  Administered 2022-02-25: 10 mg via INTRAVENOUS
  Filled 2022-02-25: qty 10

## 2022-02-26 LAB — IGG, IGA, IGM
IgA: 106 mg/dL (ref 61–437)
IgG (Immunoglobin G), Serum: 558 mg/dL — ABNORMAL LOW (ref 603–1613)
IgM (Immunoglobulin M), Srm: 1836 mg/dL — ABNORMAL HIGH (ref 20–172)

## 2022-02-28 LAB — KAPPA/LAMBDA LIGHT CHAINS
Kappa free light chain: 233.7 mg/L — ABNORMAL HIGH (ref 3.3–19.4)
Kappa, lambda light chain ratio: 22.69 — ABNORMAL HIGH (ref 0.26–1.65)
Lambda free light chains: 10.3 mg/L (ref 5.7–26.3)

## 2022-03-07 LAB — PROTEIN ELECTROPHORESIS, SERUM, WITH REFLEX
A/G Ratio: 0.9 (ref 0.7–1.7)
Albumin ELP: 3.6 g/dL (ref 2.9–4.4)
Alpha-1-Globulin: 0.3 g/dL (ref 0.0–0.4)
Alpha-2-Globulin: 1.1 g/dL — ABNORMAL HIGH (ref 0.4–1.0)
Beta Globulin: 0.8 g/dL (ref 0.7–1.3)
Gamma Globulin: 1.7 g/dL (ref 0.4–1.8)
Globulin, Total: 3.9 g/dL (ref 2.2–3.9)
M-Spike, %: 1.3 g/dL — ABNORMAL HIGH
SPEP Interpretation: 0
Total Protein ELP: 7.5 g/dL (ref 6.0–8.5)

## 2022-03-07 LAB — IMMUNOFIXATION REFLEX, SERUM
IgA: 114 mg/dL (ref 61–437)
IgG (Immunoglobin G), Serum: 603 mg/dL (ref 603–1613)
IgM (Immunoglobulin M), Srm: 1896 mg/dL — ABNORMAL HIGH (ref 20–172)

## 2022-03-08 ENCOUNTER — Other Ambulatory Visit: Payer: Self-pay

## 2022-03-08 ENCOUNTER — Encounter: Payer: Self-pay | Admitting: Hematology & Oncology

## 2022-03-08 ENCOUNTER — Ambulatory Visit (HOSPITAL_BASED_OUTPATIENT_CLINIC_OR_DEPARTMENT_OTHER)
Admission: RE | Admit: 2022-03-08 | Discharge: 2022-03-08 | Disposition: A | Discharge status: Home / Self Care | Payer: PPO | Source: Ambulatory Visit | Attending: Hematology & Oncology | Admitting: Hematology & Oncology

## 2022-03-08 DIAGNOSIS — C8583 Other specified types of non-Hodgkin lymphoma, intra-abdominal lymph nodes: Secondary | ICD-10-CM | POA: Diagnosis not present

## 2022-03-08 DIAGNOSIS — R059 Cough, unspecified: Secondary | ICD-10-CM | POA: Diagnosis not present

## 2022-03-09 ENCOUNTER — Encounter: Payer: Self-pay | Admitting: Hematology & Oncology

## 2022-03-10 ENCOUNTER — Other Ambulatory Visit: Payer: Self-pay | Admitting: *Deleted

## 2022-03-10 MED ORDER — CEFDINIR 300 MG PO CAPS: 600.0000 mg | ORAL_CAPSULE | Freq: Every day | ORAL | 0 refills | Status: DC

## 2022-03-15 ENCOUNTER — Other Ambulatory Visit: Payer: Self-pay | Admitting: Hematology & Oncology

## 2022-03-15 ENCOUNTER — Encounter: Payer: Self-pay | Admitting: Hematology & Oncology

## 2022-03-15 MED ORDER — HYDROCODONE BIT-HOMATROP MBR 5-1.5 MG/5ML PO SOLN: 5.0000 mL | Freq: Four times a day (QID) | ORAL | 0 refills | Status: DC | PRN

## 2022-03-17 ENCOUNTER — Other Ambulatory Visit: Payer: Self-pay | Admitting: *Deleted

## 2022-03-17 MED ORDER — MAGIC MOUTHWASH: 5.0000 mL | Freq: Three times a day (TID) | ORAL | 0 refills | Status: DC

## 2022-03-17 MED ORDER — AZITHROMYCIN 250 MG PO TABS: ORAL_TABLET | ORAL | 0 refills | Status: DC

## 2022-03-17 MED ORDER — METHYLPREDNISOLONE 4 MG PO TBPK: ORAL_TABLET | ORAL | 0 refills | Status: DC

## 2022-03-23 ENCOUNTER — Encounter: Payer: Self-pay | Admitting: Hematology & Oncology

## 2022-03-23 ENCOUNTER — Inpatient Hospital Stay: Payer: PPO

## 2022-03-23 ENCOUNTER — Inpatient Hospital Stay: Payer: PPO | Attending: Hematology & Oncology

## 2022-03-23 ENCOUNTER — Other Ambulatory Visit: Payer: Self-pay

## 2022-03-23 ENCOUNTER — Inpatient Hospital Stay: Payer: PPO | Admitting: Hematology & Oncology

## 2022-03-23 VITALS — BP 136/71 | HR 83 | Temp 98.4°F | Resp 16

## 2022-03-23 VITALS — BP 126/73 | HR 85 | Temp 98.5°F | Resp 17 | Ht 71.0 in | Wt 197.4 lb

## 2022-03-23 DIAGNOSIS — C8583 Other specified types of non-Hodgkin lymphoma, intra-abdominal lymph nodes: Secondary | ICD-10-CM | POA: Diagnosis present

## 2022-03-23 DIAGNOSIS — Z5112 Encounter for antineoplastic immunotherapy: Secondary | ICD-10-CM | POA: Diagnosis present

## 2022-03-23 DIAGNOSIS — Z79899 Other long term (current) drug therapy: Secondary | ICD-10-CM | POA: Insufficient documentation

## 2022-03-23 DIAGNOSIS — Z5111 Encounter for antineoplastic chemotherapy: Secondary | ICD-10-CM | POA: Insufficient documentation

## 2022-03-23 LAB — LACTATE DEHYDROGENASE: LDH: 157 U/L (ref 98–192)

## 2022-03-23 LAB — CMP (CANCER CENTER ONLY)
ALT: 45 U/L — ABNORMAL HIGH (ref 0–44)
AST: 27 U/L (ref 15–41)
Albumin: 3.9 g/dL (ref 3.5–5.0)
Alkaline Phosphatase: 59 U/L (ref 38–126)
Anion gap: 8 (ref 5–15)
BUN: 25 mg/dL — ABNORMAL HIGH (ref 8–23)
CO2: 29 mmol/L (ref 22–32)
Calcium: 9.5 mg/dL (ref 8.9–10.3)
Chloride: 98 mmol/L (ref 98–111)
Creatinine: 1.3 mg/dL — ABNORMAL HIGH (ref 0.61–1.24)
GFR, Estimated: 59 mL/min — ABNORMAL LOW (ref >60)
Glucose, Bld: 107 mg/dL — ABNORMAL HIGH (ref 70–99)
Potassium: 3.9 mmol/L (ref 3.5–5.1)
Sodium: 135 mmol/L (ref 135–145)
Total Bilirubin: 0.5 mg/dL (ref 0.3–1.2)
Total Protein: 7.2 g/dL (ref 6.5–8.1)

## 2022-03-23 LAB — CBC WITH DIFFERENTIAL (CANCER CENTER ONLY)
Abs Immature Granulocytes: 0.06 10*3/uL (ref 0.00–0.07)
Basophils Absolute: 0.1 10*3/uL (ref 0.0–0.1)
Basophils Relative: 1 %
Eosinophils Absolute: 0.2 10*3/uL (ref 0.0–0.5)
Eosinophils Relative: 3 %
HCT: 29.3 % — ABNORMAL LOW (ref 39.0–52.0)
Hemoglobin: 9.6 g/dL — ABNORMAL LOW (ref 13.0–17.0)
Immature Granulocytes: 1 %
Lymphocytes Relative: 16 %
Lymphs Abs: 0.8 10*3/uL (ref 0.7–4.0)
MCH: 30.4 pg (ref 26.0–34.0)
MCHC: 32.8 g/dL (ref 30.0–36.0)
MCV: 92.7 fL (ref 80.0–100.0)
Monocytes Absolute: 0.8 10*3/uL (ref 0.1–1.0)
Monocytes Relative: 17 %
Neutro Abs: 3.1 10*3/uL (ref 1.7–7.7)
Neutrophils Relative %: 62 %
Platelet Count: 154 10*3/uL (ref 150–400)
RBC: 3.16 MIL/uL — ABNORMAL LOW (ref 4.22–5.81)
RDW: 13.3 % (ref 11.5–15.5)
WBC Count: 4.9 10*3/uL (ref 4.0–10.5)
nRBC: 0 % (ref 0.0–0.2)

## 2022-03-23 MED ORDER — SODIUM CHLORIDE 0.9 % IV SOLN: Freq: Once | INTRAVENOUS | Status: AC

## 2022-03-23 MED ORDER — SODIUM CHLORIDE 0.9 % IV SOLN
10.0000 mg | Freq: Once | INTRAVENOUS | Status: AC
  Administered 2022-03-23: 10 mg via INTRAVENOUS
  Filled 2022-03-23: qty 10

## 2022-03-23 MED ORDER — SODIUM CHLORIDE 0.9% FLUSH: 10.0000 mL | INTRAVENOUS | Status: DC | PRN

## 2022-03-23 MED ORDER — PALONOSETRON HCL INJECTION 0.25 MG/5ML
0.2500 mg | Freq: Once | INTRAVENOUS | Status: AC
  Administered 2022-03-23: 0.25 mg via INTRAVENOUS
  Filled 2022-03-23: qty 5

## 2022-03-23 MED ORDER — SODIUM CHLORIDE 0.9 % IV SOLN
90.0000 mg/m2 | Freq: Once | INTRAVENOUS | Status: AC
  Administered 2022-03-23: 200 mg via INTRAVENOUS
  Filled 2022-03-23: qty 8

## 2022-03-23 MED ORDER — ACETAMINOPHEN 325 MG PO TABS
650.0000 mg | ORAL_TABLET | Freq: Once | ORAL | Status: AC
  Administered 2022-03-23: 650 mg via ORAL
  Filled 2022-03-23: qty 2

## 2022-03-23 MED ORDER — DIPHENHYDRAMINE HCL 25 MG PO CAPS
50.0000 mg | ORAL_CAPSULE | Freq: Once | ORAL | Status: AC
  Administered 2022-03-23: 50 mg via ORAL
  Filled 2022-03-23: qty 2

## 2022-03-23 MED ORDER — SODIUM CHLORIDE 0.9 % IV SOLN
375.0000 mg/m2 | Freq: Once | INTRAVENOUS | Status: AC
  Administered 2022-03-23: 800 mg via INTRAVENOUS
  Filled 2022-03-23: qty 50

## 2022-03-23 MED ORDER — HEPARIN SOD (PORK) LOCK FLUSH 100 UNIT/ML IV SOLN: 500.0000 [IU] | Freq: Once | INTRAVENOUS | Status: DC | PRN

## 2022-03-23 NOTE — Patient Instructions (Signed)
[facility]  Discharge Instructions: Thank you for choosing [facility] to provide your oncology and hematology care.   If you have a lab appointment with the [facility], please go directly to the [facility] and check in at the registration area.  Wear comfortable clothing and clothing appropriate for easy access to any Portacath or PICC line.   We strive to give you quality time with your provider. You may need to reschedule your appointment if you arrive late (15 or more minutes).  Arriving late affects you and other patients whose appointments are after yours.  Also, if you miss three or more appointments without notifying the office, you may be dismissed from the clinic at the provider's discretion.      For prescription refill requests, have your pharmacy contact our office and allow 72 hours for refills to be completed.    Today you received the following chemotherapy and/or immunotherapy agents Rituxan      To help prevent nausea and vomiting after your treatment, we encourage you to take your nausea medication as directed.  BELOW ARE SYMPTOMS THAT SHOULD BE REPORTED IMMEDIATELY: *FEVER GREATER THAN 100.4 F (38 C) OR HIGHER *CHILLS OR SWEATING *NAUSEA AND VOMITING THAT IS NOT CONTROLLED WITH YOUR NAUSEA MEDICATION *UNUSUAL SHORTNESS OF BREATH *UNUSUAL BRUISING OR BLEEDING *URINARY PROBLEMS (pain or burning when urinating, or frequent urination) *BOWEL PROBLEMS (unusual diarrhea, constipation, pain near the anus) TENDERNESS IN MOUTH AND THROAT WITH OR WITHOUT PRESENCE OF ULCERS (sore throat, sores in mouth, or a toothache) UNUSUAL RASH, SWELLING OR PAIN  UNUSUAL VAGINAL DISCHARGE OR ITCHING   Items with * indicate a potential emergency and should be followed up as soon as possible or go to the Emergency Department if any problems should occur.  Please show the CHEMOTHERAPY ALERT CARD or IMMUNOTHERAPY ALERT CARD at check-in to the  Emergency Department and triage nurse. Should you have questions after your visit or need to cancel or reschedule your appointment, please contact [facility]  [phone] and follow the prompts.  Office hours are 8:00 a.m. to 4:30 p.m. Monday - Friday. Please note that voicemails left after 4:00 p.m. may not be returned until the following business day.  We are closed weekends and major holidays. You have access to a nurse at all times for urgent questions. Please call the main number to the clinic [phone] and follow the prompts.  For any non-urgent questions, you may also contact your provider using MyChart. We now offer e-Visits for anyone 105 and older to request care online for non-urgent symptoms. For details visit [email or web address].   Also download the MyChart app! Go to the app store, search "MyChart", open the app, select [facility], and log in with your MyChart username and password.

## 2022-03-23 NOTE — Patient Instructions (Signed)

## 2022-03-23 NOTE — Progress Notes (Signed)
Hematology and Oncology Follow Up Visit  Toan Mort [id number] 24-Jan-1951 72 y.o. 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 Neila Gear is back for follow-up.  He really had a tough time after the second cycle of treatment.  He had a bad cough.  We did do a chest x-ray on him.  There may have been an infiltrate.  I know that he had had COVID earlier in the Fall.  He got through this.  I got him on some antibiotic and steroids.  This seemed to help quite a bit.  When we last saw him, his IgM level was down to 1860 mg/dL.  His Kappa light chain was 2.3 mg/dL.  The monoclonal spike was 1.3 g/dL.  Again he is feeling a little bit better now.  He does not need to see the Nephrologist but every 6 months now.  His kidneys are improving.  He has had no issues with fever.  He has had no bleeding.  He has had no rashes.  There has been no headache.  He has had no mouth sores.  Overall, I was his performance status is probably ECOG 1.    Medications:  Current Outpatient Medications:    azithromycin (ZITHROMAX Z-PAK) 250 MG tablet, Take per pharmacy orders, Disp: 6 each, Rfl: 0   cefdinir (OMNICEF) 300 MG capsule, Take 2 capsules (600 mg total) by mouth daily., Disp: 14 capsule, Rfl: 0   dexamethasone (DECADRON) 4 MG tablet, Take 2 tablets (8 mg total) by mouth daily. Start the day after bendamustine chemotherapy for 2 days. Take with food., Disp: 30 tablet, Rfl: 1   famciclovir (FAMVIR) 250 MG tablet, Take 1 tablet (250 mg total) by mouth daily., Disp: 30 tablet, Rfl: 12   furosemide (LASIX) 40 MG tablet, Take 40 mg by mouth daily as needed., Disp: , Rfl:    hydrALAZINE (APRESOLINE) 100  MG tablet, TAKE 1 TABLET BY MOUTH THREE TIMES A DAY, Disp: 270 tablet, Rfl: 1   HYDROcodone bit-homatropine (HYCODAN) 5-1.5 MG/5ML syrup, Take 5 mLs by mouth every 6 (six) hours as needed for cough., Disp: 120 mL, Rfl: 0   levothyroxine (SYNTHROID) 150 MCG tablet, TAKE 1 TABLET BY MOUTH EVERY DAY, Disp: 90 tablet, Rfl: 1   lidocaine-prilocaine (EMLA) cream, Apply to affected area once, Disp: 30 g, Rfl: 3   magic mouthwash SOLN, Take 5 mLs by mouth 3 (three) times daily., Disp: 120 mL, Rfl: 0   Melatonin 10 MG SUBL, Place 10 mg under the tongue as needed., Disp: , Rfl:    Multiple Vitamins-Minerals (CENTRUM SILVER 50+MEN PO), Take by mouth., Disp: , Rfl:    ondansetron (ZOFRAN) 8 MG tablet, Take 1 tablet (8 mg total) by mouth every 8 (eight) hours as needed for nausea or vomiting. Start on the third day after chemotherapy., Disp: 30 tablet, Rfl: 1   prochlorperazine (COMPAZINE) 10 MG tablet, Take 1 tablet (10 mg total) by mouth every 6 (six) hours as needed for nausea or vomiting., Disp: 30 tablet, Rfl: 1   rosuvastatin (CRESTOR) 20 MG tablet, TAKE 1 TABLET BY MOUTH EVERY DAY, Disp: 90 tablet, Rfl: 1  venlafaxine XR (EFFEXOR-XR) 37.5 MG 24 hr capsule, TAKE 1 CAPSULE BY MOUTH DAILY WITH BREAKFAST., Disp: 90 capsule, Rfl: 2   methylPREDNISolone (MEDROL DOSEPAK) 4 MG TBPK tablet, Take as written by pharmacy (Patient not taking: Reported on 03/23/2022), Disp: 21 tablet, Rfl: 0  Allergies: No Known Allergies  Past Medical History, Surgical history, Social history, and Family History were reviewed and updated.  Review of Systems: Review of Systems  Constitutional: Negative.   HENT:  Negative.    Eyes: Negative.   Respiratory: Negative.    Cardiovascular: Negative.   Gastrointestinal:  Positive for abdominal pain and nausea.  Endocrine: Negative.   Genitourinary: Negative.    Musculoskeletal: Negative.   Skin: Negative.   Neurological: Negative.   Hematological: Negative.    Psychiatric/Behavioral: Negative.      Physical Exam:  height is '5\' 11"'$  (1.803 m) and weight is 197 lb 6.4 oz (89.5 kg). His oral temperature is 98.5 F (36.9 C). His blood pressure is 126/73 and his pulse is 85. His respiration is 17 and oxygen saturation is 98%.   Wt Readings from Last 3 Encounters:  03/23/22 197 lb 6.4 oz (89.5 kg)  02/24/22 200 lb (90.7 kg)  02/01/22 195 lb (88.5 kg)    Physical Exam Vitals reviewed.  HENT:     Head: Normocephalic and atraumatic.  Eyes:     Pupils: Pupils are equal, round, and reactive to light.  Cardiovascular:     Rate and Rhythm: Normal rate and regular rhythm.     Heart sounds: Normal heart sounds.  Pulmonary:     Effort: Pulmonary effort is normal.     Breath sounds: Normal breath sounds.  Abdominal:     General: Bowel sounds are normal.     Palpations: Abdomen is soft.  Musculoskeletal:        General: No tenderness or deformity. Normal range of motion.     Cervical back: Normal range of motion.  Lymphadenopathy:     Cervical: No cervical adenopathy.  Skin:    General: Skin is warm and dry.     Findings: No erythema or rash.  Neurological:     Mental Status: He is alert and oriented to person, place, and time.  Psychiatric:        Behavior: Behavior normal.        Thought Content: Thought content normal.        Judgment: Judgment normal.     Lab Results  Component Value Date   WBC 4.9 03/23/2022   HGB 9.6 (L) 03/23/2022   HCT 29.3 (L) 03/23/2022   MCV 92.7 03/23/2022   PLT 154 03/23/2022     Chemistry      Component Value Date/Time   NA 135 03/23/2022 0933   NA 138 12/06/2019 1501   NA 142 04/25/2016 0844   NA 140 10/19/2015 0903   K 3.9 03/23/2022 0933   K 4.1 04/25/2016 0844   K 4.1 10/19/2015 0903   CL 98 03/23/2022 0933   CL 102 04/25/2016 0844   CO2 29 03/23/2022 0933   CO2 29 04/25/2016 0844   CO2 25 10/19/2015 0903   BUN 25 (H) 03/23/2022 0933   BUN 20 12/06/2019 1501   BUN 14 04/25/2016 0844    BUN 21.6 10/19/2015 0903   CREATININE 1.30 (H) 03/23/2022 0933   CREATININE 1.1 04/25/2016 0844   CREATININE 1.3 10/19/2015 0903      Component Value Date/Time   CALCIUM 9.5 03/23/2022 0933   CALCIUM  9.6 04/25/2016 0844   CALCIUM 9.4 10/19/2015 0903   ALKPHOS 59 03/23/2022 0933   ALKPHOS 59 04/25/2016 0844   ALKPHOS 68 10/19/2015 0903   AST 27 03/23/2022 0933   AST 21 10/19/2015 0903   ALT 45 (H) 03/23/2022 0933   ALT 27 04/25/2016 0844   ALT 19 10/19/2015 0903   BILITOT 0.5 03/23/2022 0933   BILITOT 0.54 10/19/2015 0903      Impression and Plan: Mr. Neila Gear is a 72 year old white male.  We treated him 10 years ago.  He had a marginal zone lymphoma.  He went into clinical remission.  When he initially presented, he had splenomegaly.  When he relapsed, he again had splenomegaly.  We subsequently treated him with Imbruvica and venetoclax.  He completed his treatments back in April.  We now have him on Rituxan/Bendamustine.  His monoclonal studies are improving.  As such, we will continue him on treatment.  I am not sure if the COVID that he had may have been somehow involved with respect to making him more sensitive to this bronchitis that he had.  If he has another bout of this, then we will have to be aggressive.  Of note, he has a decent IgG level so I do not think we have to give him IVIG.  We will plan for another follow-up in 1 month.     [name], MD 1/3/202410:41 AM

## 2022-03-24 ENCOUNTER — Inpatient Hospital Stay: Payer: PPO

## 2022-03-24 VITALS — BP 129/72 | HR 66 | Temp 97.9°F | Resp 17

## 2022-03-24 DIAGNOSIS — C8583 Other specified types of non-Hodgkin lymphoma, intra-abdominal lymph nodes: Secondary | ICD-10-CM

## 2022-03-24 DIAGNOSIS — Z5112 Encounter for antineoplastic immunotherapy: Secondary | ICD-10-CM | POA: Diagnosis not present

## 2022-03-24 LAB — KAPPA/LAMBDA LIGHT CHAINS
Kappa free light chain: 93.7 mg/L — ABNORMAL HIGH (ref 3.3–19.4)
Kappa, lambda light chain ratio: 10.65 — ABNORMAL HIGH (ref 0.26–1.65)
Lambda free light chains: 8.8 mg/L (ref 5.7–26.3)

## 2022-03-24 LAB — IGG, IGA, IGM
IgA: 95 mg/dL (ref 61–437)
IgG (Immunoglobin G), Serum: 534 mg/dL — ABNORMAL LOW (ref 603–1613)
IgM (Immunoglobulin M), Srm: 1023 mg/dL — ABNORMAL HIGH (ref 20–172)

## 2022-03-24 MED ORDER — SODIUM CHLORIDE 0.9 % IV SOLN
10.0000 mg | Freq: Once | INTRAVENOUS | Status: AC
  Administered 2022-03-24: 10 mg via INTRAVENOUS
  Filled 2022-03-24: qty 10

## 2022-03-24 MED ORDER — HEPARIN SOD (PORK) LOCK FLUSH 100 UNIT/ML IV SOLN
500.0000 [IU] | Freq: Once | INTRAVENOUS | Status: AC | PRN
  Administered 2022-03-24: 500 [IU]

## 2022-03-24 MED ORDER — SODIUM CHLORIDE 0.9 % IV SOLN
90.0000 mg/m2 | Freq: Once | INTRAVENOUS | Status: AC
  Administered 2022-03-24: 200 mg via INTRAVENOUS
  Filled 2022-03-24: qty 8

## 2022-03-24 MED ORDER — SODIUM CHLORIDE 0.9 % IV SOLN: Freq: Once | INTRAVENOUS | Status: AC

## 2022-03-24 MED ORDER — SODIUM CHLORIDE 0.9% FLUSH
10.0000 mL | INTRAVENOUS | Status: DC | PRN
  Administered 2022-03-24: 10 mL

## 2022-03-24 NOTE — Patient Instructions (Signed)
[facility]  Discharge Instructions: Thank you for choosing [facility] to provide your oncology and hematology care.   If you have a lab appointment with the [facility], please go directly to the [facility] and check in at the registration area.  Wear comfortable clothing and clothing appropriate for easy access to any Portacath or PICC line.   We strive to give you quality time with your provider. You may need to reschedule your appointment if you arrive late (15 or more minutes).  Arriving late affects you and other patients whose appointments are after yours.  Also, if you miss three or more appointments without notifying the office, you may be dismissed from the clinic at the provider's discretion.      For prescription refill requests, have your pharmacy contact our office and allow 72 hours for refills to be completed.    Today you received the following chemotherapy and/or immunotherapy agents [place].      To help prevent nausea and vomiting after your treatment, we encourage you to take your nausea medication as directed.  BELOW ARE SYMPTOMS THAT SHOULD BE REPORTED IMMEDIATELY: *FEVER GREATER THAN 100.4 F (38 C) OR HIGHER *CHILLS OR SWEATING *NAUSEA AND VOMITING THAT IS NOT CONTROLLED WITH YOUR NAUSEA MEDICATION *UNUSUAL SHORTNESS OF BREATH *UNUSUAL BRUISING OR BLEEDING *URINARY PROBLEMS (pain or burning when urinating, or frequent urination) *BOWEL PROBLEMS (unusual diarrhea, constipation, pain near the anus) TENDERNESS IN MOUTH AND THROAT WITH OR WITHOUT PRESENCE OF ULCERS (sore throat, sores in mouth, or a toothache) UNUSUAL RASH, SWELLING OR PAIN  UNUSUAL VAGINAL DISCHARGE OR ITCHING   Items with * indicate a potential emergency and should be followed up as soon as possible or go to the Emergency Department if any problems should occur.  Please show the CHEMOTHERAPY ALERT CARD or IMMUNOTHERAPY ALERT CARD at check-in to the  Emergency Department and triage nurse. Should you have questions after your visit or need to cancel or reschedule your appointment, please contact [facility]  [phone] and follow the prompts.  Office hours are 8:00 a.m. to 4:30 p.m. Monday - Friday. Please note that voicemails left after 4:00 p.m. may not be returned until the following business day.  We are closed weekends and major holidays. You have access to a nurse at all times for urgent questions. Please call the main number to the clinic [phone] and follow the prompts.  For any non-urgent questions, you may also contact your provider using MyChart. We now offer e-Visits for anyone 44 and older to request care online for non-urgent symptoms. For details visit [email or web address].   Also download the MyChart app! Go to the app store, search "MyChart", open the app, select , and log in with your MyChart username and password.  Masks are optional in the cancer centers. If you would like for your care team to wear a mask while they are taking care of you, please let them know. You may have one support person who is at least 70 years old accompany you for your appointments.

## 2022-03-27 ENCOUNTER — Encounter: Payer: Self-pay | Admitting: Hematology & Oncology

## 2022-03-28 ENCOUNTER — Encounter: Payer: Self-pay | Admitting: Internal Medicine

## 2022-03-28 MED ORDER — NIRMATRELVIR/RITONAVIR (PAXLOVID)TABLET: 3.0000 | ORAL_TABLET | Freq: Two times a day (BID) | ORAL | 0 refills | Status: AC

## 2022-03-28 NOTE — Telephone Encounter (Signed)
Patient's wife called back concerning this message.

## 2022-03-31 LAB — PROTEIN ELECTROPHORESIS, SERUM, WITH REFLEX
A/G Ratio: 0.9 (ref 0.7–1.7)
Albumin ELP: 3.1 g/dL (ref 2.9–4.4)
Alpha-1-Globulin: 0.3 g/dL (ref 0.0–0.4)
Alpha-2-Globulin: 1.2 g/dL — ABNORMAL HIGH (ref 0.4–1.0)
Beta Globulin: 0.8 g/dL (ref 0.7–1.3)
Gamma Globulin: 1.2 g/dL (ref 0.4–1.8)
Globulin, Total: 3.5 g/dL (ref 2.2–3.9)
M-Spike, %: 0.7 g/dL — ABNORMAL HIGH
SPEP Interpretation: 0
Total Protein ELP: 6.6 g/dL (ref 6.0–8.5)

## 2022-03-31 LAB — IMMUNOFIXATION REFLEX, SERUM
IgA: 104 mg/dL (ref 61–437)
IgG (Immunoglobin G), Serum: 599 mg/dL — ABNORMAL LOW (ref 603–1613)
IgM (Immunoglobulin M), Srm: 1113 mg/dL — ABNORMAL HIGH (ref 20–172)

## 2022-04-01 ENCOUNTER — Encounter: Payer: Self-pay | Admitting: *Deleted

## 2022-04-01 ENCOUNTER — Telehealth: Payer: PPO | Admitting: Internal Medicine

## 2022-04-05 ENCOUNTER — Encounter: Payer: Self-pay | Admitting: Internal Medicine

## 2022-04-05 ENCOUNTER — Encounter: Payer: Self-pay | Admitting: Hematology & Oncology

## 2022-04-06 ENCOUNTER — Encounter: Payer: Self-pay | Admitting: Internal Medicine

## 2022-04-06 NOTE — Progress Notes (Signed)
Subjective:    Patient ID: [name] [name], male    DOB: [birth year], 72 y.o.   MRN: [id number]      HPI [name] [name] is here for  Chief Complaint  Patient presents with   Dizziness    Dizziness   Abdominal Pain    Stomach discomfort at times in the middle of his stomach     Dizziness - having intermittent dizziness - severe at times.  He has felt close to passing out. He feels lightheaded and like things are going to black out.  It occurs when he is up and about walking or doing soemting.  The other day he had just gotten out of the shower and it hit - he laid down on the bed for 15 mina nd it helped.    Dr foster - dec hydralazine to 50 mg tid,  dec lasix to prn.it did get better.  But has gotten bad again.      Pressure in stomach area at times.  No related to eating or bowels.  It is not related to the lightheadedness-occurs randomly.  He is not sure if there is any pattern to when this occurs.    Medications and allergies reviewed with patient and updated if appropriate.  Current Outpatient Medications on File Prior to Visit  Medication Sig Dispense Refill   dexamethasone (DECADRON) 4 MG tablet Take 2 tablets (8 mg total) by mouth daily. Start the day after bendamustine chemotherapy for 2 days. Take with food. 30 tablet 1   famciclovir (FAMVIR) 250 MG tablet Take 1 tablet (250 mg total) by mouth daily. 30 tablet 12   furosemide (LASIX) 40 MG tablet Take 40 mg by mouth daily as needed.     levothyroxine (SYNTHROID) 150 MCG tablet TAKE 1 TABLET BY MOUTH EVERY DAY 90 tablet 1   lidocaine-prilocaine (EMLA) cream Apply to affected area once 30 g 3   magic mouthwash SOLN Take 5 mLs by mouth 3 (three) times daily. 120 mL 0   Melatonin 10 MG SUBL Place 10 mg under the tongue as needed.     Multiple Vitamins-Minerals (CENTRUM SILVER 50+MEN PO) Take by mouth.     ondansetron (ZOFRAN) 8 MG tablet Take 1 tablet (8 mg total) by mouth every 8 (eight) hours as needed for nausea or  vomiting. Start on the third day after chemotherapy. 30 tablet 1   prochlorperazine (COMPAZINE) 10 MG tablet Take 1 tablet (10 mg total) by mouth every 6 (six) hours as needed for nausea or vomiting. 30 tablet 1   venlafaxine XR (EFFEXOR-XR) 37.5 MG 24 hr capsule TAKE 1 CAPSULE BY MOUTH DAILY WITH BREAKFAST. 90 capsule 2   No current facility-administered medications on file prior to visit.    Review of Systems  Constitutional:  Positive for fatigue and fever (low grade fever intermitent 100.1).  HENT:  Negative for congestion, ear pain and sore throat.   Respiratory:  Positive for shortness of breath (with exertion). Negative for cough and wheezing.   Cardiovascular:  Positive for leg swelling (chronic, better). Negative for chest pain and palpitations.  Gastrointestinal:  Positive for abdominal pain (abdominal pressure- intermittent - not associated with lightheadedness.) and constipation (occ after chemo -no related to lightheadedness, abd pain). Negative for blood in stool, diarrhea and nausea.  Genitourinary:  Positive for frequency (nocturia only - normal during day) and urgency (at times). Negative for dysuria and hematuria.  Neurological:  Positive for light-headedness. Negative for dizziness and headaches.  Objective:   Vitals:   04/07/22 0856  BP: 130/62  Pulse: (!) 115  Temp: 98.6 F (37 C)  SpO2: 98%   BP Readings from Last 3 Encounters:  04/07/22 130/62  03/24/22 129/72  03/23/22 136/71   Wt Readings from Last 3 Encounters:  04/07/22 186 lb (84.4 kg)  03/23/22 197 lb 6.4 oz (89.5 kg)  02/24/22 200 lb (90.7 kg)   Body mass index is 25.94 kg/m.    Physical Exam Constitutional:      General: He is not in acute distress.    Appearance: Normal appearance. He is not ill-appearing.  HENT:     Head: Normocephalic and atraumatic.  Eyes:     Conjunctiva/sclera: Conjunctivae normal.  Cardiovascular:     Rate and Rhythm: Normal rate and regular rhythm.      Heart sounds: Normal heart sounds. No murmur heard. Pulmonary:     Effort: Pulmonary effort is normal. No respiratory distress.     Breath sounds: Normal breath sounds. No wheezing or rales.  Abdominal:     General: Abdomen is flat. There is no distension.     Palpations: Abdomen is soft.     Tenderness: There is no abdominal tenderness. There is no guarding or rebound.  Musculoskeletal:     Right lower leg: No edema.     Left lower leg: No edema.  Skin:    General: Skin is warm and dry.     Findings: No rash.  Neurological:     General: No focal deficit present.     Mental Status: He is alert. Mental status is at baseline.  Psychiatric:        Mood and Affect: Mood normal.            Assessment & Plan:    See Problem List for Assessment and Plan of chronic medical problems.

## 2022-04-07 ENCOUNTER — Other Ambulatory Visit: Payer: Self-pay | Admitting: Internal Medicine

## 2022-04-07 ENCOUNTER — Ambulatory Visit (INDEPENDENT_AMBULATORY_CARE_PROVIDER_SITE_OTHER): Payer: PPO | Admitting: Internal Medicine

## 2022-04-07 VITALS — BP 130/62 | HR 115 | Temp 98.6°F | Ht 71.0 in | Wt 186.0 lb

## 2022-04-07 DIAGNOSIS — R109 Unspecified abdominal pain: Secondary | ICD-10-CM | POA: Diagnosis not present

## 2022-04-07 DIAGNOSIS — E032 Hypothyroidism due to medicaments and other exogenous substances: Secondary | ICD-10-CM | POA: Diagnosis not present

## 2022-04-07 DIAGNOSIS — R42 Dizziness and giddiness: Secondary | ICD-10-CM | POA: Diagnosis not present

## 2022-04-07 MED ORDER — HYDRALAZINE HCL 25 MG PO TABS: 25.0000 mg | ORAL_TABLET | Freq: Three times a day (TID) | ORAL | 5 refills | Status: DC

## 2022-04-07 NOTE — Assessment & Plan Note (Signed)
Chronic Overdue for tsh I do not think the lightheadedness is related to the thyroid  Tsh with next labs with onc Continue levothyroxine 150 mcg daily - will titrate as needed

## 2022-04-07 NOTE — Assessment & Plan Note (Signed)
Acute Has been experiencing intermittent lightheadedness which typically occurs when he is up and moving around, once it did occur while he was driving.  Improves with sitting or laying No associated symptoms Blood pressure medication, Lasix recently decreased by Dr. Royce Macadamia for same symptoms-symptoms improved, but then got bad again He is orthostatic here today and is likely dehydrated Will decrease hydralazine to 25 mg 3 times daily, continue furosemide as needed Increase fluids-specifically water Advised wearing compression socks Advised sitting on the edge of the bed before getting up Monitor blood pressure at home Let me know if symptoms or not improving Sees cardiology in a couple weeks

## 2022-04-07 NOTE — Assessment & Plan Note (Signed)
Acute Having intermittent abdominal discomfort-not true pain Not related to eating, bowel movements or the lightheadedness Reviewed last CT of abdomen and pelvis Denies frequent constipation except for after chemo.  Denies diarrhea, nausea, urinary concerns Unsure of cause He will monitor for now see if there is any pattern

## 2022-04-07 NOTE — Patient Instructions (Addendum)
      Medications changes include :   decrease hydralazine to 25 mg three times a day.     Monitor your BP at home.  Increase your fluids.     Return if symptoms worsen or fail to improve.

## 2022-04-18 NOTE — Progress Notes (Signed)
Cardiology Office Note   Date:  04/25/2022   ID:  [name] [name], DOB 1950-09-04, MRN [id number]  PCP:  Binnie Rail, MD  Cardiologist:   Nahshon Reich [place], MD   Chief Complaint  Patient presents with   Coronary Artery Disease       History of Present Illness: [name] [name] is a 72 y.o. male who is seen for follow up CAD/coronary calcification.  He has a history of HLD and hypothyroidism. 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  Treated with Imbruvica.  CT in February 2021 showed calcification in the left main coronary artery.    He retired from the [employer] in 2008. Prior to that he had yearly stress testing done. He has no history of DM, HTN, or tobacco use.  He does have a family history of CAD with father dying at age 67 with MI. palpitations. ETT in April 2021 was normal.   He was followed closely in our HTN clinic. He developed HTN while on Imbruvica for treatment of lymphoma. Was on  Valsartan and triamterene HCT. Developed edema on amlodipine. He then developed worsening renal function with creatinine up to 2.14 and potassium up to 6.6.  Renal US was normal. His BP medication was switched to hydralazine 100 mg tid. He did have recurrent lymphoma diagnosed in November. Now on IV chemo with Rituxan and bendamustine.   With these changes BP has improved. He did have some orthostasis so hydralazine was reduced.  No longer on amlodipine. He is followed by Nephrology. Renal function also better. His only complaint is of a persistent dry cough. Echo done in Nov was normal.  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 Start the day after bendamustine chemotherapy for 2 days. Take with food. 30 tablet 1   famciclovir (FAMVIR) 250 MG tablet Take 1 tablet (250 mg total) by mouth daily. 30 tablet 12   furosemide (LASIX) 40 MG tablet Take 40 mg by mouth daily as needed.     hydrALAZINE (APRESOLINE) 25 MG tablet Take 1 tablet (25 mg total) by mouth 3 (three) times daily. 90 tablet 5   lidocaine-prilocaine (EMLA) cream Apply to affected area once 30 g 3   Melatonin 10 MG SUBL Place 10 mg under the tongue as needed.     Multiple Vitamins-Minerals (CENTRUM SILVER 50+MEN PO) Take by mouth.     ondansetron (ZOFRAN) 8 MG tablet Take 1 tablet (8 mg total) by mouth every 8 (eight) hours as needed for nausea or vomiting. Start on the third day after chemotherapy. 30 tablet 1   prochlorperazine (COMPAZINE) 10 MG tablet Take 1 tablet (10 mg total) by mouth every 6 (six) hours as needed for nausea  or vomiting. 30 tablet 1   rosuvastatin (CRESTOR) 20 MG tablet TAKE 1 TABLET BY MOUTH EVERY DAY 90 tablet 1   venlafaxine XR (EFFEXOR-XR) 37.5 MG 24 hr capsule TAKE 1 CAPSULE BY MOUTH DAILY WITH BREAKFAST. 90 capsule 2   No current facility-administered medications for this visit.    Allergies:   Patient has no known allergies.    Social History:  The patient  reports that he has never smoked. He has never used smokeless tobacco. He reports current alcohol use. He reports that he does not use drugs.   Family History:  The patient's family history includes Alzheimer's disease in his mother; COPD in his brother; Coronary artery disease in his brother; Dementia in his brother; Diabetes in his brother; Heart attack (age of onset: 6) in his  father; Heart disease in his paternal grandfather; Hypertension in his brother; Stroke in his brother.    ROS:  Please see the history of present illness.   Otherwise, review of systems are positive for none.   All other systems are reviewed and negative.    PHYSICAL EXAM: VS:  BP 128/62   Pulse 93   Ht '5\' 11"'$  (1.803 m)   Wt 197 lb 6.4 oz (89.5 kg)   SpO2 97%   BMI 27.53 kg/m  , BMI Body mass index is 27.53 kg/m. GEN: Well nourished, well developed, in no acute distress  HEENT: normal  Neck: no JVD, carotid bruits, or masses Cardiac: RRR; no murmurs, rubs, or gallops,no edema  Respiratory:  clear to auscultation bilaterally, normal work of breathing GI: soft, nontender, nondistended, + BS MS: no deformity or atrophy  Skin: warm and dry, no rash Neuro:  Strength and sensation are intact Psych: euthymic mood, full affect   EKG:  EKG is not ordered today.    Recent Labs: 04/21/2022: ALT 14; BUN 19; Creatinine 1.28; Hemoglobin 9.6; Platelet Count 109; Potassium 3.8; Sodium 136; TSH 0.037    Lipid Panel    Component Value Date/Time   CHOL 136 05/21/2021 0655   TRIG 90 05/21/2021 0655   TRIG 175 (H) 10/19/2015 0903   TRIG 106 02/22/2006 1133   HDL 60 05/21/2021 0655   CHOLHDL 2.3 05/21/2021 0655   CHOLHDL 3 08/07/2019 0857   VLDL 21.6 08/07/2019 0857   LDLCALC 59 05/21/2021 0655      Wt Readings from Last 3 Encounters:  04/25/22 197 lb 6.4 oz (89.5 kg)  04/21/22 192 lb 12.8 oz (87.5 kg)  04/07/22 186 lb (84.4 kg)      Other studies Reviewed: Additional studies/ records that were reviewed today include:   ETT 07/04/20: Study Highlights  Blood pressure demonstrated a hypertensive response to exercise. Clinically negative, electrically negative for ischemia   Echo 01/26/22: IMPRESSIONS     1. Left ventricular ejection fraction, by estimation, is 55 to 60%. The  left ventricle has normal function. The left ventricle has no regional  wall motion abnormalities.  There is mild concentric left ventricular  hypertrophy of the basal-septal segment.  Left ventricular diastolic parameters were normal.   2. Right ventricular systolic function is normal. The right ventricular  size is normal.   3. The mitral valve is grossly normal. Trivial mitral valve  regurgitation. No evidence of mitral stenosis.   4. The aortic valve is calcified. Aortic valve regurgitation is not  visualized. Aortic valve sclerosis/calcification is present, without any  evidence of aortic stenosis.   5. There is mild dilatation of the ascending aorta, measuring  39 mm.   ASSESSMENT AND PLAN:  1.  Coronary calcification.  focal calcification of the left main. Otherwise no significant calcification. He is asymptomatic. Risk factors of hypercholesterolemia and family history of premature CAD. ETT was normal in 2021. Recommend risk factor modification.   2. Hypercholesterolemia. Now on  Crestor. Goal LDL < 70. LDL 59  3. Marginal zone lymphoma recurrent. Per oncology  4. HTN- currently under good control with low dose hydralazine.   5. CKD stage 3b- followed by Dr Royce Macadamia with [facility]. Creatinine improved to 1.3.     Current medicines are reviewed at length with the patient today.  The patient does not have concerns regarding medicines.  The following changes have been made:  no change  Labs/ tests ordered today include:   No orders of the defined types were placed in this encounter.     Disposition:   FU one  year  Signed, Reinhart Saulters Martinique, MD  04/25/2022 9:51 AM    [facility] [address], [place], [place], [address] Phone [phone], Fax [phone]

## 2022-04-19 ENCOUNTER — Encounter: Payer: Self-pay | Admitting: Hematology & Oncology

## 2022-04-19 DIAGNOSIS — R059 Cough, unspecified: Secondary | ICD-10-CM

## 2022-04-19 MED ORDER — METHYLPREDNISOLONE 4 MG PO TBPK: ORAL_TABLET | ORAL | 0 refills | Status: DC

## 2022-04-21 ENCOUNTER — Inpatient Hospital Stay: Payer: PPO

## 2022-04-21 ENCOUNTER — Encounter: Payer: Self-pay | Admitting: Hematology & Oncology

## 2022-04-21 ENCOUNTER — Inpatient Hospital Stay: Payer: PPO | Attending: Hematology & Oncology

## 2022-04-21 ENCOUNTER — Other Ambulatory Visit: Payer: Self-pay

## 2022-04-21 ENCOUNTER — Inpatient Hospital Stay (HOSPITAL_BASED_OUTPATIENT_CLINIC_OR_DEPARTMENT_OTHER): Payer: PPO | Admitting: Hematology & Oncology

## 2022-04-21 VITALS — BP 138/70 | HR 88 | Temp 97.8°F | Resp 18 | Ht 71.0 in | Wt 192.8 lb

## 2022-04-21 VITALS — BP 150/83 | HR 68 | Temp 98.0°F | Resp 16

## 2022-04-21 DIAGNOSIS — Z5112 Encounter for antineoplastic immunotherapy: Secondary | ICD-10-CM | POA: Diagnosis not present

## 2022-04-21 DIAGNOSIS — Z5111 Encounter for antineoplastic chemotherapy: Secondary | ICD-10-CM | POA: Diagnosis not present

## 2022-04-21 DIAGNOSIS — Z79620 Long term (current) use of immunosuppressive biologic: Secondary | ICD-10-CM | POA: Diagnosis not present

## 2022-04-21 DIAGNOSIS — Z79899 Other long term (current) drug therapy: Secondary | ICD-10-CM | POA: Diagnosis not present

## 2022-04-21 DIAGNOSIS — C8583 Other specified types of non-Hodgkin lymphoma, intra-abdominal lymph nodes: Secondary | ICD-10-CM | POA: Diagnosis not present

## 2022-04-21 LAB — CMP (CANCER CENTER ONLY)
ALT: 14 U/L (ref 0–44)
AST: 17 U/L (ref 15–41)
Albumin: 4.1 g/dL (ref 3.5–5.0)
Alkaline Phosphatase: 57 U/L (ref 38–126)
Anion gap: 10 (ref 5–15)
BUN: 19 mg/dL (ref 8–23)
CO2: 25 mmol/L (ref 22–32)
Calcium: 9.8 mg/dL (ref 8.9–10.3)
Chloride: 101 mmol/L (ref 98–111)
Creatinine: 1.28 mg/dL — ABNORMAL HIGH (ref 0.61–1.24)
GFR, Estimated: >60 mL/min (ref >60)
Glucose, Bld: 125 mg/dL — ABNORMAL HIGH (ref 70–99)
Potassium: 3.8 mmol/L (ref 3.5–5.1)
Sodium: 136 mmol/L (ref 135–145)
Total Bilirubin: 0.5 mg/dL (ref 0.3–1.2)
Total Protein: 7.5 g/dL (ref 6.5–8.1)

## 2022-04-21 LAB — CBC WITH DIFFERENTIAL (CANCER CENTER ONLY)
Abs Immature Granulocytes: 0.01 10*3/uL (ref 0.00–0.07)
Basophils Absolute: 0 10*3/uL (ref 0.0–0.1)
Basophils Relative: 0 %
Eosinophils Absolute: 0 10*3/uL (ref 0.0–0.5)
Eosinophils Relative: 0 %
HCT: 28.7 % — ABNORMAL LOW (ref 39.0–52.0)
Hemoglobin: 9.6 g/dL — ABNORMAL LOW (ref 13.0–17.0)
Immature Granulocytes: 0 %
Lymphocytes Relative: 37 %
Lymphs Abs: 1 10*3/uL (ref 0.7–4.0)
MCH: 30.3 pg (ref 26.0–34.0)
MCHC: 33.4 g/dL (ref 30.0–36.0)
MCV: 90.5 fL (ref 80.0–100.0)
Monocytes Absolute: 0.6 10*3/uL (ref 0.1–1.0)
Monocytes Relative: 23 %
Neutro Abs: 1.1 10*3/uL — ABNORMAL LOW (ref 1.7–7.7)
Neutrophils Relative %: 40 %
Platelet Count: 109 10*3/uL — ABNORMAL LOW (ref 150–400)
RBC: 3.17 MIL/uL — ABNORMAL LOW (ref 4.22–5.81)
RDW: 13.9 % (ref 11.5–15.5)
WBC Count: 2.7 10*3/uL — ABNORMAL LOW (ref 4.0–10.5)
nRBC: 0 % (ref 0.0–0.2)

## 2022-04-21 LAB — LACTATE DEHYDROGENASE: LDH: 186 U/L (ref 98–192)

## 2022-04-21 LAB — TSH: TSH: 0.037 u[IU]/mL — ABNORMAL LOW (ref 0.350–4.500)

## 2022-04-21 MED ORDER — SODIUM CHLORIDE 0.9% FLUSH
10.0000 mL | INTRAVENOUS | Status: DC | PRN
  Administered 2022-04-21: 10 mL

## 2022-04-21 MED ORDER — PALONOSETRON HCL INJECTION 0.25 MG/5ML
0.2500 mg | Freq: Once | INTRAVENOUS | Status: AC
  Administered 2022-04-21: 0.25 mg via INTRAVENOUS
  Filled 2022-04-21: qty 5

## 2022-04-21 MED ORDER — SODIUM CHLORIDE 0.9 % IV SOLN: Freq: Once | INTRAVENOUS | Status: AC

## 2022-04-21 MED ORDER — ACETAMINOPHEN 325 MG PO TABS
650.0000 mg | ORAL_TABLET | Freq: Once | ORAL | Status: AC
  Administered 2022-04-21: 650 mg via ORAL
  Filled 2022-04-21: qty 2

## 2022-04-21 MED ORDER — SODIUM CHLORIDE 0.9 % IV SOLN
375.0000 mg/m2 | Freq: Once | INTRAVENOUS | Status: AC
  Administered 2022-04-21: 800 mg via INTRAVENOUS
  Filled 2022-04-21: qty 50

## 2022-04-21 MED ORDER — SODIUM CHLORIDE 0.9 % IV SOLN
10.0000 mg | Freq: Once | INTRAVENOUS | Status: AC
  Administered 2022-04-21: 10 mg via INTRAVENOUS
  Filled 2022-04-21: qty 10

## 2022-04-21 MED ORDER — HEPARIN SOD (PORK) LOCK FLUSH 100 UNIT/ML IV SOLN
500.0000 [IU] | Freq: Once | INTRAVENOUS | Status: AC | PRN
  Administered 2022-04-21: 500 [IU]

## 2022-04-21 MED ORDER — DIPHENHYDRAMINE HCL 25 MG PO CAPS
50.0000 mg | ORAL_CAPSULE | Freq: Once | ORAL | Status: AC
  Administered 2022-04-21: 50 mg via ORAL
  Filled 2022-04-21: qty 2

## 2022-04-21 MED ORDER — SODIUM CHLORIDE 0.9 % IV SOLN
72.0000 mg/m2 | Freq: Once | INTRAVENOUS | Status: AC
  Administered 2022-04-21: 150 mg via INTRAVENOUS
  Filled 2022-04-21: qty 6

## 2022-04-21 NOTE — Progress Notes (Signed)
Ok to treat with ANC of 1.1 per Dr Marin Olp. dph

## 2022-04-21 NOTE — Patient Instructions (Signed)

## 2022-04-21 NOTE — Patient Instructions (Signed)
[facility]  Discharge Instructions: Thank you for choosing [facility] to provide your oncology and hematology care.   If you have a lab appointment with the [facility], please go directly to the [facility] and check in at the registration area.  Wear comfortable clothing and clothing appropriate for easy access to any Portacath or PICC line.   We strive to give you quality time with your provider. You may need to reschedule your appointment if you arrive late (15 or more minutes).  Arriving late affects you and other patients whose appointments are after yours.  Also, if you miss three or more appointments without notifying the office, you may be dismissed from the clinic at the provider's discretion.      For prescription refill requests, have your pharmacy contact our office and allow 72 hours for refills to be completed.    Today you received the following chemotherapy and/or immunotherapy agents Rituxan, Bendeka      To help prevent nausea and vomiting after your treatment, we encourage you to take your nausea medication as directed.  BELOW ARE SYMPTOMS THAT SHOULD BE REPORTED IMMEDIATELY: *FEVER GREATER THAN 100.4 F (38 C) OR HIGHER *CHILLS OR SWEATING *NAUSEA AND VOMITING THAT IS NOT CONTROLLED WITH YOUR NAUSEA MEDICATION *UNUSUAL SHORTNESS OF BREATH *UNUSUAL BRUISING OR BLEEDING *URINARY PROBLEMS (pain or burning when urinating, or frequent urination) *BOWEL PROBLEMS (unusual diarrhea, constipation, pain near the anus) TENDERNESS IN MOUTH AND THROAT WITH OR WITHOUT PRESENCE OF ULCERS (sore throat, sores in mouth, or a toothache) UNUSUAL RASH, SWELLING OR PAIN  UNUSUAL VAGINAL DISCHARGE OR ITCHING   Items with * indicate a potential emergency and should be followed up as soon as possible or go to the Emergency Department if any problems should occur.  Please show the CHEMOTHERAPY ALERT CARD or IMMUNOTHERAPY ALERT CARD at  check-in to the Emergency Department and triage nurse. Should you have questions after your visit or need to cancel or reschedule your appointment, please contact [facility]  [phone] and follow the prompts.  Office hours are 8:00 a.m. to 4:30 p.m. Monday - Friday. Please note that voicemails left after 4:00 p.m. may not be returned until the following business day.  We are closed weekends and major holidays. You have access to a nurse at all times for urgent questions. Please call the main number to the clinic [phone] and follow the prompts.  For any non-urgent questions, you may also contact your provider using MyChart. We now offer e-Visits for anyone 80 and older to request care online for non-urgent symptoms. For details visit [email or web address].   Also download the MyChart app! Go to the app store, search "MyChart", open the app, select [facility], and log in with your MyChart username and password.

## 2022-04-21 NOTE — Progress Notes (Signed)
Hematology and Oncology Follow Up Visit  [name] [name] [id number] 19-Jul-1950 72 y.o. 04/21/2022   Principle Diagnosis:  Marginal zone lymphoma-treated with R-CVP in February 2013 -- relapsed  Current Therapy:   Imbruvica 420 mg po q day - changed on 01/02/2020 -- d/c on 06/2021 Venetoclax 200 mg po q day -- start on 06/19/2020 - d/c on 05/24/2021 Rituxan Bendamustine --s/p cycle #3 -  start  on 01/27/2022     Interim History:  [name] [name] is back for follow-up.  I think he is tolerated treatment pretty well.  However, he started to have this cough again.  I am still unsure as to what the cough is about.  He does not cough up anything.  He does not have any wheezing.  He may have a little bit of a low-grade temperature.  There is no bleeding.  He does have little bit of abdominal discomfort in the upper abdomen.  The 1 thing I thought was this might be reflux.  I told him to take some over-the-counter Pepcid.  I told him to make sure he takes it twice a day.  We will see if this may help.  He has responded well to treatment.  His last monoclonal spike was down to 0.7 g/dL.  The IgM level was 1050 mg/dL.  The Kappa light chain was down to 9.4 mg/dL.  He has had no problems with bowels or bladder.  He still has little bit of swelling in the legs.  He is on diuretic for this.  He has had no rashes.  He has had no headache.  He has had no mouth sores.  Overall, I would say that his performance status is probably ECOG 1.      Medications:  Current Outpatient Medications:    dexamethasone (DECADRON) 4 MG tablet, Take 2 tablets (8 mg total) by mouth daily. Start the day after bendamustine chemotherapy for 2 days. Take with food., Disp: 30 tablet, Rfl: 1   famciclovir (FAMVIR) 250 MG tablet, Take 1 tablet (250 mg total) by mouth daily., Disp: 30 tablet, Rfl: 12   furosemide (LASIX) 40 MG tablet, Take 40 mg by mouth daily as needed., Disp: , Rfl:    hydrALAZINE (APRESOLINE) 25 MG tablet,  Take 1 tablet (25 mg total) by mouth 3 (three) times daily., Disp: 90 tablet, Rfl: 5   levothyroxine (SYNTHROID) 150 MCG tablet, TAKE 1 TABLET BY MOUTH EVERY DAY, Disp: 90 tablet, Rfl: 1   lidocaine-prilocaine (EMLA) cream, Apply to affected area once, Disp: 30 g, Rfl: 3   Melatonin 10 MG SUBL, Place 10 mg under the tongue as needed., Disp: , Rfl:    methylPREDNISolone (MEDROL DOSEPAK) 4 MG TBPK tablet, Please take as directed., Disp: 21 tablet, Rfl: 0   Multiple Vitamins-Minerals (CENTRUM SILVER 50+MEN PO), Take by mouth., Disp: , Rfl:    ondansetron (ZOFRAN) 8 MG tablet, Take 1 tablet (8 mg total) by mouth every 8 (eight) hours as needed for nausea or vomiting. Start on the third day after chemotherapy., Disp: 30 tablet, Rfl: 1   prochlorperazine (COMPAZINE) 10 MG tablet, Take 1 tablet (10 mg total) by mouth every 6 (six) hours as needed for nausea or vomiting., Disp: 30 tablet, Rfl: 1   rosuvastatin (CRESTOR) 20 MG tablet, TAKE 1 TABLET BY MOUTH EVERY DAY, Disp: 90 tablet, Rfl: 1   venlafaxine XR (EFFEXOR-XR) 37.5 MG 24 hr capsule, TAKE 1 CAPSULE BY MOUTH DAILY WITH BREAKFAST., Disp: 90 capsule, Rfl: 2  Allergies: No Known Allergies  Past Medical History, Surgical history, Social history, and Family History were reviewed and updated.  Review of Systems: Review of Systems  Constitutional: Negative.   HENT:  Negative.    Eyes: Negative.   Respiratory: Negative.    Cardiovascular: Negative.   Gastrointestinal:  Positive for abdominal pain and nausea.  Endocrine: Negative.   Genitourinary: Negative.    Musculoskeletal: Negative.   Skin: Negative.   Neurological: Negative.   Hematological: Negative.   Psychiatric/Behavioral: Negative.      Physical Exam:  height is '5\' 11"'$  (1.803 m) and weight is 192 lb 12.8 oz (87.5 kg). His oral temperature is 97.8 F (36.6 C). His blood pressure is 138/70 and his pulse is 88. His respiration is 18 and oxygen saturation is 98%.   Wt Readings from  Last 3 Encounters:  04/21/22 192 lb 12.8 oz (87.5 kg)  04/07/22 186 lb (84.4 kg)  03/23/22 197 lb 6.4 oz (89.5 kg)    Physical Exam Vitals reviewed.  HENT:     Head: Normocephalic and atraumatic.  Eyes:     Pupils: Pupils are equal, round, and reactive to light.  Cardiovascular:     Rate and Rhythm: Normal rate and regular rhythm.     Heart sounds: Normal heart sounds.  Pulmonary:     Effort: Pulmonary effort is normal.     Breath sounds: Normal breath sounds.  Abdominal:     General: Bowel sounds are normal.     Palpations: Abdomen is soft.  Musculoskeletal:        General: No tenderness or deformity. Normal range of motion.     Cervical back: Normal range of motion.  Lymphadenopathy:     Cervical: No cervical adenopathy.  Skin:    General: Skin is warm and dry.     Findings: No erythema or rash.  Neurological:     Mental Status: He is alert and oriented to person, place, and time.  Psychiatric:        Behavior: Behavior normal.        Thought Content: Thought content normal.        Judgment: Judgment normal.    Lab Results  Component Value Date   WBC 2.7 (L) 04/21/2022   HGB 9.6 (L) 04/21/2022   HCT 28.7 (L) 04/21/2022   MCV 90.5 04/21/2022   PLT 109 (L) 04/21/2022     Chemistry      Component Value Date/Time   NA 135 03/23/2022 0933   NA 138 12/06/2019 1501   NA 142 04/25/2016 0844   NA 140 10/19/2015 0903   K 3.9 03/23/2022 0933   K 4.1 04/25/2016 0844   K 4.1 10/19/2015 0903   CL 98 03/23/2022 0933   CL 102 04/25/2016 0844   CO2 29 03/23/2022 0933   CO2 29 04/25/2016 0844   CO2 25 10/19/2015 0903   BUN 25 (H) 03/23/2022 0933   BUN 20 12/06/2019 1501   BUN 14 04/25/2016 0844   BUN 21.6 10/19/2015 0903   CREATININE 1.30 (H) 03/23/2022 0933   CREATININE 1.1 04/25/2016 0844   CREATININE 1.3 10/19/2015 0903      Component Value Date/Time   CALCIUM 9.5 03/23/2022 0933   CALCIUM 9.6 04/25/2016 0844   CALCIUM 9.4 10/19/2015 0903   ALKPHOS 59  03/23/2022 0933   ALKPHOS 59 04/25/2016 0844   ALKPHOS 68 10/19/2015 0903   AST 27 03/23/2022 0933   AST 21 10/19/2015 0903   ALT 45 (H)  03/23/2022 0933   ALT 27 04/25/2016 0844   ALT 19 10/19/2015 0903   BILITOT 0.5 03/23/2022 0933   BILITOT 0.54 10/19/2015 0903      Impression and Plan: [name] [name] is a 72 year old white male.  We treated him 10 years ago.  He had a marginal zone lymphoma.  He went into clinical remission.  When he initially presented, he had splenomegaly.  When he relapsed, he again had splenomegaly.  We subsequently treated him with Imbruvica and venetoclax.  He completed his treatments back in April.  We now have him on Rituxan/Bendamustine.  His monoclonal studies are improving.  As such, we will continue him on treatment.  I still not sure as to why he has the cough.  The cough not all the time.  Hopefully, the Pepcid might help.  We will just have to see if it does.  We will go ahead and plan to get him back in a month.  Today be as fourth cycle of treatment.    [name], MD 2/1/20248:30 AM

## 2022-04-22 ENCOUNTER — Inpatient Hospital Stay: Payer: PPO

## 2022-04-22 VITALS — BP 126/66 | HR 73 | Temp 97.9°F | Resp 18

## 2022-04-22 DIAGNOSIS — Z5112 Encounter for antineoplastic immunotherapy: Secondary | ICD-10-CM | POA: Diagnosis not present

## 2022-04-22 DIAGNOSIS — C8583 Other specified types of non-Hodgkin lymphoma, intra-abdominal lymph nodes: Secondary | ICD-10-CM

## 2022-04-22 LAB — KAPPA/LAMBDA LIGHT CHAINS
Kappa free light chain: 94.6 mg/L — ABNORMAL HIGH (ref 3.3–19.4)
Kappa, lambda light chain ratio: 12.45 — ABNORMAL HIGH (ref 0.26–1.65)
Lambda free light chains: 7.6 mg/L (ref 5.7–26.3)

## 2022-04-22 MED ORDER — SODIUM CHLORIDE 0.9 % IV SOLN: Freq: Once | INTRAVENOUS | Status: AC

## 2022-04-22 MED ORDER — SODIUM CHLORIDE 0.9 % IV SOLN
72.0000 mg/m2 | Freq: Once | INTRAVENOUS | Status: AC
  Administered 2022-04-22: 150 mg via INTRAVENOUS
  Filled 2022-04-22: qty 6

## 2022-04-22 MED ORDER — SODIUM CHLORIDE 0.9 % IV SOLN
10.0000 mg | Freq: Once | INTRAVENOUS | Status: AC
  Administered 2022-04-22: 10 mg via INTRAVENOUS
  Filled 2022-04-22: qty 10

## 2022-04-22 MED ORDER — HEPARIN SOD (PORK) LOCK FLUSH 100 UNIT/ML IV SOLN
500.0000 [IU] | Freq: Once | INTRAVENOUS | Status: AC | PRN
  Administered 2022-04-22: 500 [IU]

## 2022-04-22 MED ORDER — SODIUM CHLORIDE 0.9% FLUSH
10.0000 mL | INTRAVENOUS | Status: DC | PRN
  Administered 2022-04-22: 10 mL

## 2022-04-22 NOTE — Patient Instructions (Signed)
[facility]  Discharge Instructions: Thank you for choosing [facility] to provide your oncology and hematology care.   If you have a lab appointment with the Cancer Center, please go directly to the Cancer Center and check in at the registration area.  Wear comfortable clothing and clothing appropriate for easy access to any Portacath or PICC line.   We strive to give you quality time with your provider. You may need to reschedule your appointment if you arrive late (15 or more minutes).  Arriving late affects you and other patients whose appointments are after yours.  Also, if you miss three or more appointments without notifying the office, you may be dismissed from the clinic at the provider's discretion.      For prescription refill requests, have your pharmacy contact our office and allow 72 hours for refills to be completed.    Today you received the following chemotherapy and/or immunotherapy agents bendeka   To help prevent nausea and vomiting after your treatment, we encourage you to take your nausea medication as directed.  BELOW ARE SYMPTOMS THAT SHOULD BE REPORTED IMMEDIATELY: *FEVER GREATER THAN 100.4 F (38 C) OR HIGHER *CHILLS OR SWEATING *NAUSEA AND VOMITING THAT IS NOT CONTROLLED WITH YOUR NAUSEA MEDICATION *UNUSUAL SHORTNESS OF BREATH *UNUSUAL BRUISING OR BLEEDING *URINARY PROBLEMS (pain or burning when urinating, or frequent urination) *BOWEL PROBLEMS (unusual diarrhea, constipation, pain near the anus) TENDERNESS IN MOUTH AND THROAT WITH OR WITHOUT PRESENCE OF ULCERS (sore throat, sores in mouth, or a toothache) UNUSUAL RASH, SWELLING OR PAIN  UNUSUAL VAGINAL DISCHARGE OR ITCHING   Items with * indicate a potential emergency and should be followed up as soon as possible or go to the Emergency Department if any problems should occur.  Please show the CHEMOTHERAPY ALERT CARD or IMMUNOTHERAPY ALERT CARD at check-in to  the Emergency Department and triage nurse. Should you have questions after your visit or need to cancel or reschedule your appointment, please contact [facility]  [phone] and follow the prompts.  Office hours are 8:00 a.m. to 4:30 p.m. Monday - Friday. Please note that voicemails left after 4:00 p.m. may not be returned until the following business day.  We are closed weekends and major holidays. You have access to a nurse at all times for urgent questions. Please call the main number to the clinic [phone] and follow the prompts.  For any non-urgent questions, you may also contact your provider using MyChart. We now offer e-Visits for anyone 18 and older to request care online for non-urgent symptoms. For details visit [email or web address].   Also download the MyChart app! Go to the app store, search "MyChart", open the app, select [facility], and log in with your MyChart username and password.   

## 2022-04-23 ENCOUNTER — Other Ambulatory Visit: Payer: Self-pay | Admitting: Internal Medicine

## 2022-04-23 LAB — IGG, IGA, IGM
IgA: 94 mg/dL (ref 61–437)
IgG (Immunoglobin G), Serum: 514 mg/dL — ABNORMAL LOW (ref 603–1613)
IgM (Immunoglobulin M), Srm: 858 mg/dL — ABNORMAL HIGH (ref 20–172)

## 2022-04-23 NOTE — Telephone Encounter (Signed)
Recent blood work showed thyroid level to be too high. We need to decrease his medication to 137 mcg daily.   Rx is pending.  If possible have tsh rechecked in about 2 months by oncology.

## 2022-04-24 ENCOUNTER — Other Ambulatory Visit: Payer: Self-pay

## 2022-04-25 ENCOUNTER — Encounter: Payer: Self-pay | Admitting: Cardiology

## 2022-04-25 ENCOUNTER — Ambulatory Visit: Payer: PPO | Attending: Cardiology | Admitting: Cardiology

## 2022-04-25 VITALS — BP 128/62 | HR 93 | Ht 71.0 in | Wt 197.4 lb

## 2022-04-25 DIAGNOSIS — R931 Abnormal findings on diagnostic imaging of heart and coronary circulation: Secondary | ICD-10-CM | POA: Diagnosis not present

## 2022-04-25 DIAGNOSIS — I159 Secondary hypertension, unspecified: Secondary | ICD-10-CM

## 2022-04-25 DIAGNOSIS — E7849 Other hyperlipidemia: Secondary | ICD-10-CM

## 2022-04-25 MED ORDER — LEVOTHYROXINE SODIUM 137 MCG PO TABS: 137.0000 ug | ORAL_TABLET | Freq: Every day | ORAL | 3 refills | Status: DC

## 2022-04-25 NOTE — Telephone Encounter (Signed)
Called and left message for patient to return call to clinic about medication change.  Also sent my-chart message as well.

## 2022-04-25 NOTE — Telephone Encounter (Signed)
Patient called back and said that that was fine.

## 2022-04-25 NOTE — Patient Instructions (Signed)
Medication Instructions:  No Changes *If you need a refill on your cardiac medications before your next appointment, please call your pharmacy*   Lab Work: None Ordered If you have labs (blood work) drawn today and your tests are completely normal, you will receive your results only by: [place] (if you have MyChart) OR A paper copy in the mail If you have any lab test that is abnormal or we need to change your treatment, we will call you to review the results.   Testing/Procedures: None Ordered   Follow-Up: At [facility], you and your health needs are our priority.  As part of our continuing mission to provide you with exceptional heart care, we have created designated Provider Care Teams.  These Care Teams include your primary Cardiologist (physician) and Advanced Practice Providers (APPs -  Physician Assistants and Nurse Practitioners) who all work together to provide you with the care you need, when you need it.  Your next appointment:   1 year(s)  Provider:   Peter Martinique, MD  Other Instructions

## 2022-04-26 ENCOUNTER — Other Ambulatory Visit: Payer: Self-pay | Admitting: *Deleted

## 2022-04-26 DIAGNOSIS — C8583 Other specified types of non-Hodgkin lymphoma, intra-abdominal lymph nodes: Secondary | ICD-10-CM

## 2022-04-26 DIAGNOSIS — R059 Cough, unspecified: Secondary | ICD-10-CM

## 2022-04-26 MED ORDER — CEFDINIR 300 MG PO CAPS: 600.0000 mg | ORAL_CAPSULE | Freq: Every day | ORAL | 0 refills | Status: DC

## 2022-04-28 DIAGNOSIS — D225 Melanocytic nevi of trunk: Secondary | ICD-10-CM | POA: Diagnosis not present

## 2022-04-28 DIAGNOSIS — Z1283 Encounter for screening for malignant neoplasm of skin: Secondary | ICD-10-CM | POA: Diagnosis not present

## 2022-04-28 DIAGNOSIS — S61412A Laceration without foreign body of left hand, initial encounter: Secondary | ICD-10-CM | POA: Diagnosis not present

## 2022-04-28 DIAGNOSIS — Z08 Encounter for follow-up examination after completed treatment for malignant neoplasm: Secondary | ICD-10-CM | POA: Diagnosis not present

## 2022-04-28 DIAGNOSIS — Z86006 Personal history of melanoma in-situ: Secondary | ICD-10-CM | POA: Diagnosis not present

## 2022-04-29 ENCOUNTER — Encounter: Payer: Self-pay | Admitting: *Deleted

## 2022-04-29 LAB — IMMUNOFIXATION REFLEX, SERUM
IgA: 110 mg/dL (ref 61–437)
IgG (Immunoglobin G), Serum: 583 mg/dL — ABNORMAL LOW (ref 603–1613)
IgM (Immunoglobulin M), Srm: 950 mg/dL — ABNORMAL HIGH (ref 20–172)

## 2022-04-29 LAB — PROTEIN ELECTROPHORESIS, SERUM, WITH REFLEX
A/G Ratio: 1 (ref 0.7–1.7)
Albumin ELP: 3.2 g/dL (ref 2.9–4.4)
Alpha-1-Globulin: 0.3 g/dL (ref 0.0–0.4)
Alpha-2-Globulin: 1 g/dL (ref 0.4–1.0)
Beta Globulin: 0.9 g/dL (ref 0.7–1.3)
Gamma Globulin: 1 g/dL (ref 0.4–1.8)
Globulin, Total: 3.3 g/dL (ref 2.2–3.9)
M-Spike, %: 0.6 g/dL — ABNORMAL HIGH
SPEP Interpretation: 0
Total Protein ELP: 6.5 g/dL (ref 6.0–8.5)

## 2022-05-04 ENCOUNTER — Other Ambulatory Visit: Payer: Self-pay

## 2022-05-04 DIAGNOSIS — R059 Cough, unspecified: Secondary | ICD-10-CM

## 2022-05-05 ENCOUNTER — Ambulatory Visit (HOSPITAL_BASED_OUTPATIENT_CLINIC_OR_DEPARTMENT_OTHER)
Admission: RE | Admit: 2022-05-05 | Discharge: 2022-05-05 | Disposition: A | Discharge status: Home / Self Care | Payer: PPO | Source: Ambulatory Visit | Attending: Hematology & Oncology | Admitting: Hematology & Oncology

## 2022-05-05 DIAGNOSIS — R059 Cough, unspecified: Secondary | ICD-10-CM | POA: Diagnosis not present

## 2022-05-05 DIAGNOSIS — R509 Fever, unspecified: Secondary | ICD-10-CM | POA: Diagnosis not present

## 2022-05-09 ENCOUNTER — Telehealth: Payer: Self-pay | Admitting: *Deleted

## 2022-05-09 NOTE — Telephone Encounter (Signed)
Emailed pt with results

## 2022-05-19 ENCOUNTER — Inpatient Hospital Stay: Payer: PPO

## 2022-05-19 ENCOUNTER — Inpatient Hospital Stay (HOSPITAL_BASED_OUTPATIENT_CLINIC_OR_DEPARTMENT_OTHER): Payer: PPO | Admitting: Hematology & Oncology

## 2022-05-19 ENCOUNTER — Encounter: Payer: Self-pay | Admitting: Hematology & Oncology

## 2022-05-19 VITALS — BP 129/71 | HR 88 | Temp 98.4°F | Resp 20 | Ht 71.0 in | Wt 188.4 lb

## 2022-05-19 VITALS — BP 131/61 | HR 61 | Resp 17

## 2022-05-19 DIAGNOSIS — C8583 Other specified types of non-Hodgkin lymphoma, intra-abdominal lymph nodes: Secondary | ICD-10-CM

## 2022-05-19 DIAGNOSIS — R058 Other specified cough: Secondary | ICD-10-CM | POA: Diagnosis not present

## 2022-05-19 DIAGNOSIS — D126 Benign neoplasm of colon, unspecified: Secondary | ICD-10-CM

## 2022-05-19 DIAGNOSIS — D508 Other iron deficiency anemias: Secondary | ICD-10-CM

## 2022-05-19 DIAGNOSIS — Z5112 Encounter for antineoplastic immunotherapy: Secondary | ICD-10-CM | POA: Diagnosis not present

## 2022-05-19 DIAGNOSIS — R059 Cough, unspecified: Secondary | ICD-10-CM

## 2022-05-19 LAB — CMP (CANCER CENTER ONLY)
ALT: 16 U/L (ref 0–44)
AST: 21 U/L (ref 15–41)
Albumin: 4 g/dL (ref 3.5–5.0)
Alkaline Phosphatase: 61 U/L (ref 38–126)
Anion gap: 8 (ref 5–15)
BUN: 17 mg/dL (ref 8–23)
CO2: 27 mmol/L (ref 22–32)
Calcium: 9.7 mg/dL (ref 8.9–10.3)
Chloride: 103 mmol/L (ref 98–111)
Creatinine: 1.45 mg/dL — ABNORMAL HIGH (ref 0.61–1.24)
GFR, Estimated: 52 mL/min — ABNORMAL LOW (ref >60)
Glucose, Bld: 98 mg/dL (ref 70–99)
Potassium: 3.7 mmol/L (ref 3.5–5.1)
Sodium: 138 mmol/L (ref 135–145)
Total Bilirubin: 0.4 mg/dL (ref 0.3–1.2)
Total Protein: 6.6 g/dL (ref 6.5–8.1)

## 2022-05-19 LAB — CBC WITH DIFFERENTIAL (CANCER CENTER ONLY)
Abs Immature Granulocytes: 0.01 10*3/uL (ref 0.00–0.07)
Basophils Absolute: 0 10*3/uL (ref 0.0–0.1)
Basophils Relative: 1 %
Eosinophils Absolute: 0.1 10*3/uL (ref 0.0–0.5)
Eosinophils Relative: 5 %
HCT: 28.3 % — ABNORMAL LOW (ref 39.0–52.0)
Hemoglobin: 9.4 g/dL — ABNORMAL LOW (ref 13.0–17.0)
Immature Granulocytes: 0 %
Lymphocytes Relative: 13 %
Lymphs Abs: 0.4 10*3/uL — ABNORMAL LOW (ref 0.7–4.0)
MCH: 30.8 pg (ref 26.0–34.0)
MCHC: 33.2 g/dL (ref 30.0–36.0)
MCV: 92.8 fL (ref 80.0–100.0)
Monocytes Absolute: 0.9 10*3/uL (ref 0.1–1.0)
Monocytes Relative: 33 %
Neutro Abs: 1.4 10*3/uL — ABNORMAL LOW (ref 1.7–7.7)
Neutrophils Relative %: 48 %
Platelet Count: 130 10*3/uL — ABNORMAL LOW (ref 150–400)
RBC: 3.05 MIL/uL — ABNORMAL LOW (ref 4.22–5.81)
RDW: 14.6 % (ref 11.5–15.5)
WBC Count: 2.8 10*3/uL — ABNORMAL LOW (ref 4.0–10.5)
nRBC: 0 % (ref 0.0–0.2)

## 2022-05-19 LAB — IRON AND IRON BINDING CAPACITY (CC-WL,HP ONLY)
Iron: 66 ug/dL (ref 45–182)
Saturation Ratios: 23 % (ref 17.9–39.5)
TIBC: 290 ug/dL (ref 250–450)
UIBC: 224 ug/dL (ref 117–376)

## 2022-05-19 LAB — LACTATE DEHYDROGENASE: LDH: 178 U/L (ref 98–192)

## 2022-05-19 LAB — VITAMIN B12: Vitamin B-12: 313 pg/mL (ref 180–914)

## 2022-05-19 LAB — FERRITIN: Ferritin: 155 ng/mL (ref 24–336)

## 2022-05-19 LAB — RETICULOCYTES
Immature Retic Fract: 25.8 % — ABNORMAL HIGH (ref 2.3–15.9)
RBC.: 3.12 MIL/uL — ABNORMAL LOW (ref 4.22–5.81)
Retic Count, Absolute: 82.7 10*3/uL (ref 19.0–186.0)
Retic Ct Pct: 2.7 % (ref 0.4–3.1)

## 2022-05-19 MED ORDER — PALONOSETRON HCL INJECTION 0.25 MG/5ML
0.2500 mg | Freq: Once | INTRAVENOUS | Status: AC
  Administered 2022-05-19: 0.25 mg via INTRAVENOUS
  Filled 2022-05-19: qty 5

## 2022-05-19 MED ORDER — SODIUM CHLORIDE 0.9% FLUSH
10.0000 mL | Freq: Once | INTRAVENOUS | Status: AC
  Administered 2022-05-19: 10 mL

## 2022-05-19 MED ORDER — SODIUM CHLORIDE 0.9 % IV SOLN: Freq: Once | INTRAVENOUS | Status: AC

## 2022-05-19 MED ORDER — HEPARIN SOD (PORK) LOCK FLUSH 100 UNIT/ML IV SOLN
500.0000 [IU] | Freq: Once | INTRAVENOUS | Status: AC | PRN
  Administered 2022-05-19: 500 [IU]

## 2022-05-19 MED ORDER — DIPHENHYDRAMINE HCL 25 MG PO CAPS
50.0000 mg | ORAL_CAPSULE | Freq: Once | ORAL | Status: AC
  Administered 2022-05-19: 50 mg via ORAL
  Filled 2022-05-19: qty 2

## 2022-05-19 MED ORDER — ACETAMINOPHEN 325 MG PO TABS
650.0000 mg | ORAL_TABLET | Freq: Once | ORAL | Status: AC
  Administered 2022-05-19: 650 mg via ORAL
  Filled 2022-05-19: qty 2

## 2022-05-19 MED ORDER — SODIUM CHLORIDE 0.9 % IV SOLN
375.0000 mg/m2 | Freq: Once | INTRAVENOUS | Status: AC
  Administered 2022-05-19: 800 mg via INTRAVENOUS
  Filled 2022-05-19: qty 50

## 2022-05-19 MED ORDER — SODIUM CHLORIDE 0.9 % IV SOLN
72.0000 mg/m2 | Freq: Once | INTRAVENOUS | Status: AC
  Administered 2022-05-19: 152.5 mg via INTRAVENOUS
  Filled 2022-05-19: qty 6.1

## 2022-05-19 MED ORDER — SODIUM CHLORIDE 0.9% FLUSH
10.0000 mL | INTRAVENOUS | Status: DC | PRN
  Administered 2022-05-19: 10 mL

## 2022-05-19 MED ORDER — IPRATROPIUM-ALBUTEROL 20-100 MCG/ACT IN AERS: 1.0000 | INHALATION_SPRAY | Freq: Four times a day (QID) | RESPIRATORY_TRACT | 4 refills | Status: DC

## 2022-05-19 MED ORDER — SODIUM CHLORIDE 0.9 % IV SOLN
10.0000 mg | Freq: Once | INTRAVENOUS | Status: AC
  Administered 2022-05-19: 10 mg via INTRAVENOUS
  Filled 2022-05-19: qty 10

## 2022-05-19 NOTE — Progress Notes (Signed)
Ok to treat with ANC 1.4 per Dr Marin Olp

## 2022-05-19 NOTE — Progress Notes (Signed)
Per Dr. Marin Olp ok to run rituxan infusion as rapid. Pharmacy aware.

## 2022-05-19 NOTE — Patient Instructions (Signed)

## 2022-05-19 NOTE — Progress Notes (Signed)
Hematology and Oncology Follow Up Visit  [name] [name] [id number] 08/07/1950 72 y.o. 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 is back for follow-up.  Unfortunately, he is still having pulmonary symptoms.  He still has a cough.  He has some wheezing.  Is hard to say what might be going on.  I think he is going need to have a CT of the chest.  I do not know if he has any kind of bronchiectasis or some type of pneumonitis.  We have done a chest x-ray on him in the past which was relatively unremarkable.  He has had some low-grade temperatures.  He says he does cough up some purulent mucus.  There is no blood in the mucus.    Ultimately, he may need to have Pulmonary see him and possibly do a bronchoscopy.  He is responded very nicely to treatment.  His last monoclonal spike was down to 0.6 g/dL.  The IgM level that he was at 800 mg/dL.  The Kappa light chain was down to 9.5 mg/dL.  He also feels tired.  He is anemic.  I think the anemia is probably from his chemotherapy.  I think the anemia is also partly from his renal insufficiency.  I will check some anemia studies on him including an erythropoietin level.  We may have to give him some ESA.  Henrene Pastor does have little bit of leukopenia.  This also might be causing some of the fatigue.  He also has a bit of swelling with the left elbow.  There is no warmth or erythema.  Looks like he has a little bit of fluid collection there.  Currently, I would say that he has a performance status of ECOG 1.      Medications:  Current Outpatient Medications:    dexamethasone (DECADRON) 4 MG tablet, Take 2 tablets (8 mg total) by mouth  daily. Start the day after bendamustine chemotherapy for 2 days. Take with food., Disp: 30 tablet, Rfl: 1   famciclovir (FAMVIR) 250 MG tablet, Take 1 tablet (250 mg total) by mouth daily., Disp: 30 tablet, Rfl: 12   hydrALAZINE (APRESOLINE) 25 MG tablet, Take 1 tablet (25 mg total) by mouth 3 (three) times daily., Disp: 90 tablet, Rfl: 5   levothyroxine (SYNTHROID) 137 MCG tablet, Take 1 tablet (137 mcg total) by mouth daily before breakfast., Disp: 90 tablet, Rfl: 3   Melatonin 10 MG SUBL, Place 10 mg under the tongue as needed., Disp: , Rfl:    Multiple Vitamins-Minerals (CENTRUM SILVER 50+MEN PO), Take by mouth., Disp: , Rfl:    ondansetron (ZOFRAN) 8 MG tablet, Take 1 tablet (8 mg total) by mouth every 8 (eight) hours as needed for nausea or vomiting. Start on the third day after chemotherapy., Disp: 30 tablet, Rfl: 1   prochlorperazine (COMPAZINE) 10 MG tablet, Take 1 tablet (10 mg total) by mouth every 6 (six) hours as needed for nausea or vomiting., Disp: 30 tablet, Rfl: 1   rosuvastatin (CRESTOR) 20 MG tablet, TAKE 1 TABLET BY MOUTH EVERY DAY, Disp: 90 tablet, Rfl: 1   venlafaxine XR (EFFEXOR-XR) 37.5 MG  24 hr capsule, TAKE 1 CAPSULE BY MOUTH DAILY WITH BREAKFAST., Disp: 90 capsule, Rfl: 2   furosemide (LASIX) 40 MG tablet, Take 40 mg by mouth daily as needed. (Patient not taking: Reported on 05/19/2022), Disp: , Rfl:    lidocaine-prilocaine (EMLA) cream, Apply to affected area once (Patient not taking: Reported on 05/19/2022), Disp: 30 g, Rfl: 3  Allergies: No Known Allergies  Past Medical History, Surgical history, Social history, and Family History were reviewed and updated.  Review of Systems: Review of Systems  Constitutional: Negative.   HENT:  Negative.    Eyes: Negative.   Respiratory: Negative.    Cardiovascular: Negative.   Gastrointestinal:  Positive for abdominal pain and nausea.  Endocrine: Negative.   Genitourinary: Negative.    Musculoskeletal: Negative.   Skin:  Negative.   Neurological: Negative.   Hematological: Negative.   Psychiatric/Behavioral: Negative.      Physical Exam:  height is '5\' 11"'$  (1.803 m) and weight is 188 lb 6.4 oz (85.5 kg). His oral temperature is 98.4 F (36.9 C). His blood pressure is 129/71 and his pulse is 88. His respiration is 20 and oxygen saturation is 96%.   Wt Readings from Last 3 Encounters:  05/19/22 188 lb 6.4 oz (85.5 kg)  04/25/22 197 lb 6.4 oz (89.5 kg)  04/21/22 192 lb 12.8 oz (87.5 kg)    Physical Exam Vitals reviewed.  HENT:     Head: Normocephalic and atraumatic.  Eyes:     Pupils: Pupils are equal, round, and reactive to light.  Cardiovascular:     Rate and Rhythm: Normal rate and regular rhythm.     Heart sounds: Normal heart sounds.  Pulmonary:     Effort: Pulmonary effort is normal.     Breath sounds: Normal breath sounds.  Abdominal:     General: Bowel sounds are normal.     Palpations: Abdomen is soft.  Musculoskeletal:        General: No tenderness or deformity. Normal range of motion.     Cervical back: Normal range of motion.  Lymphadenopathy:     Cervical: No cervical adenopathy.  Skin:    General: Skin is warm and dry.     Findings: No erythema or rash.  Neurological:     Mental Status: He is alert and oriented to person, place, and time.  Psychiatric:        Behavior: Behavior normal.        Thought Content: Thought content normal.        Judgment: Judgment normal.    Lab Results  Component Value Date   WBC 2.7 (L) 04/21/2022   HGB 9.6 (L) 04/21/2022   HCT 28.7 (L) 04/21/2022   MCV 90.5 04/21/2022   PLT 109 (L) 04/21/2022     Chemistry      Component Value Date/Time   NA 136 04/21/2022 0810   NA 138 12/06/2019 1501   NA 142 04/25/2016 0844   NA 140 10/19/2015 0903   K 3.8 04/21/2022 0810   K 4.1 04/25/2016 0844   K 4.1 10/19/2015 0903   CL 101 04/21/2022 0810   CL 102 04/25/2016 0844   CO2 25 04/21/2022 0810   CO2 29 04/25/2016 0844   CO2 25 10/19/2015  0903   BUN 19 04/21/2022 0810   BUN 20 12/06/2019 1501   BUN 14 04/25/2016 0844   BUN 21.6 10/19/2015 0903   CREATININE 1.28 (H) 04/21/2022 0810   CREATININE 1.1 04/25/2016 0844   CREATININE  1.3 10/19/2015 0903      Component Value Date/Time   CALCIUM 9.8 04/21/2022 0810   CALCIUM 9.6 04/25/2016 0844   CALCIUM 9.4 10/19/2015 0903   ALKPHOS 57 04/21/2022 0810   ALKPHOS 59 04/25/2016 0844   ALKPHOS 68 10/19/2015 0903   AST 17 04/21/2022 0810   AST 21 10/19/2015 0903   ALT 14 04/21/2022 0810   ALT 27 04/25/2016 0844   ALT 19 10/19/2015 0903   BILITOT 0.5 04/21/2022 0810   BILITOT 0.54 10/19/2015 0903      Impression and Plan: [name] is a 72 year old white male.  We treated him 10 years ago.  He had a marginal zone lymphoma.  He went into clinical remission.  When he initially presented, he had splenomegaly.  When he relapsed, he again had splenomegaly.  We subsequently treated him with Imbruvica and venetoclax.  He completed his treatments back in April.  We now have him on Rituxan/Bendamustine.  His monoclonal studies are improving.  As such, we will continue him on treatment.  I think this will be his 5th cycle.  I would have more worried about the pulmonary issues.  I am also worried about the anemia.  I think we have to try to address these which affect his quality of life.  I think this is a working progress.  He does have some wheezing.  I will send him a inhaler.  Will see if this may help a little bit.  We will still plan to get him back in another 4 weeks.  Again, it would not surprise me if we see him a little bit sooner depending on what his labs show.    [name], MD 2/29/20248:18 AM

## 2022-05-20 ENCOUNTER — Inpatient Hospital Stay: Payer: PPO | Attending: Hematology & Oncology

## 2022-05-20 VITALS — BP 125/59 | HR 74 | Temp 98.2°F | Resp 18

## 2022-05-20 DIAGNOSIS — C8583 Other specified types of non-Hodgkin lymphoma, intra-abdominal lymph nodes: Secondary | ICD-10-CM

## 2022-05-20 DIAGNOSIS — M79662 Pain in left lower leg: Secondary | ICD-10-CM | POA: Insufficient documentation

## 2022-05-20 DIAGNOSIS — Z5112 Encounter for antineoplastic immunotherapy: Secondary | ICD-10-CM | POA: Diagnosis not present

## 2022-05-20 DIAGNOSIS — Z5111 Encounter for antineoplastic chemotherapy: Secondary | ICD-10-CM | POA: Diagnosis not present

## 2022-05-20 LAB — KAPPA/LAMBDA LIGHT CHAINS
Kappa free light chain: 83.9 mg/L — ABNORMAL HIGH (ref 3.3–19.4)
Kappa, lambda light chain ratio: 8.07 — ABNORMAL HIGH (ref 0.26–1.65)
Lambda free light chains: 10.4 mg/L (ref 5.7–26.3)

## 2022-05-20 LAB — IGG, IGA, IGM
IgA: 84 mg/dL (ref 61–437)
IgG (Immunoglobin G), Serum: 434 mg/dL — ABNORMAL LOW (ref 603–1613)
IgM (Immunoglobulin M), Srm: 559 mg/dL — ABNORMAL HIGH (ref 20–172)

## 2022-05-20 LAB — ERYTHROPOIETIN: Erythropoietin: 38 m[IU]/mL — ABNORMAL HIGH (ref 2.6–18.5)

## 2022-05-20 MED ORDER — SODIUM CHLORIDE 0.9 % IV SOLN
72.0000 mg/m2 | Freq: Once | INTRAVENOUS | Status: AC
  Administered 2022-05-20: 152.5 mg via INTRAVENOUS
  Filled 2022-05-20: qty 6.1

## 2022-05-20 MED ORDER — SODIUM CHLORIDE 0.9 % IV SOLN: Freq: Once | INTRAVENOUS | Status: AC

## 2022-05-20 MED ORDER — SODIUM CHLORIDE 0.9% FLUSH
10.0000 mL | INTRAVENOUS | Status: DC | PRN
  Administered 2022-05-20: 10 mL

## 2022-05-20 MED ORDER — SODIUM CHLORIDE 0.9 % IV SOLN
10.0000 mg | Freq: Once | INTRAVENOUS | Status: AC
  Administered 2022-05-20: 10 mg via INTRAVENOUS
  Filled 2022-05-20: qty 10

## 2022-05-20 MED ORDER — HEPARIN SOD (PORK) LOCK FLUSH 100 UNIT/ML IV SOLN
500.0000 [IU] | Freq: Once | INTRAVENOUS | Status: AC | PRN
  Administered 2022-05-20: 500 [IU]

## 2022-05-20 NOTE — Patient Instructions (Signed)
[facility]  Discharge Instructions: Thank you for choosing [facility] to provide your oncology and hematology care.   If you have a lab appointment with the [facility], please go directly to the [facility] and check in at the registration area.  Wear comfortable clothing and clothing appropriate for easy access to any Portacath or PICC line.   We strive to give you quality time with your provider. You may need to reschedule your appointment if you arrive late (15 or more minutes).  Arriving late affects you and other patients whose appointments are after yours.  Also, if you miss three or more appointments without notifying the office, you may be dismissed from the clinic at the provider's discretion.      For prescription refill requests, have your pharmacy contact our office and allow 72 hours for refills to be completed.    Today you received the following chemotherapy and/or immunotherapy agents bendeka   To help prevent nausea and vomiting after your treatment, we encourage you to take your nausea medication as directed.  BELOW ARE SYMPTOMS THAT SHOULD BE REPORTED IMMEDIATELY: *FEVER GREATER THAN 100.4 F (38 C) OR HIGHER *CHILLS OR SWEATING *NAUSEA AND VOMITING THAT IS NOT CONTROLLED WITH YOUR NAUSEA MEDICATION *UNUSUAL SHORTNESS OF BREATH *UNUSUAL BRUISING OR BLEEDING *URINARY PROBLEMS (pain or burning when urinating, or frequent urination) *BOWEL PROBLEMS (unusual diarrhea, constipation, pain near the anus) TENDERNESS IN MOUTH AND THROAT WITH OR WITHOUT PRESENCE OF ULCERS (sore throat, sores in mouth, or a toothache) UNUSUAL RASH, SWELLING OR PAIN  UNUSUAL VAGINAL DISCHARGE OR ITCHING   Items with * indicate a potential emergency and should be followed up as soon as possible or go to the Emergency Department if any problems should occur.  Please show the CHEMOTHERAPY ALERT CARD or IMMUNOTHERAPY ALERT CARD at check-in to  the Emergency Department and triage nurse. Should you have questions after your visit or need to cancel or reschedule your appointment, please contact [facility]  [phone] and follow the prompts.  Office hours are 8:00 a.m. to 4:30 p.m. Monday - Friday. Please note that voicemails left after 4:00 p.m. may not be returned until the following business day.  We are closed weekends and major holidays. You have access to a nurse at all times for urgent questions. Please call the main number to the clinic [phone] and follow the prompts.  For any non-urgent questions, you may also contact your provider using MyChart. We now offer e-Visits for anyone 3 and older to request care online for non-urgent symptoms. For details visit [email or web address].   Also download the MyChart app! Go to the app store, search "MyChart", open the app, select [facility], and log in with your MyChart username and password.

## 2022-05-23 ENCOUNTER — Ambulatory Visit (HOSPITAL_BASED_OUTPATIENT_CLINIC_OR_DEPARTMENT_OTHER)
Admission: RE | Admit: 2022-05-23 | Discharge: 2022-05-23 | Disposition: A | Discharge status: Home / Self Care | Payer: PPO | Source: Ambulatory Visit | Attending: Hematology & Oncology | Admitting: Hematology & Oncology

## 2022-05-23 DIAGNOSIS — R058 Other specified cough: Secondary | ICD-10-CM | POA: Insufficient documentation

## 2022-05-23 DIAGNOSIS — J479 Bronchiectasis, uncomplicated: Secondary | ICD-10-CM | POA: Diagnosis not present

## 2022-05-25 ENCOUNTER — Telehealth: Payer: Self-pay

## 2022-05-25 ENCOUNTER — Other Ambulatory Visit: Payer: Self-pay

## 2022-05-25 DIAGNOSIS — R059 Cough, unspecified: Secondary | ICD-10-CM

## 2022-05-25 NOTE — Telephone Encounter (Signed)
-----   Message from [name], MD sent at 05/25/2022 11:52 AM EST ----- Call - the CT scan does show some bronchiectasis -- this may be why he coughs.  He also has a hiatal hernia -- this could cause him to cough.  He needs to see a pulmonary specialist.  Please put in a referral to Dr. Malvin Johns .  Laurey Arrow

## 2022-05-25 NOTE — Telephone Encounter (Signed)
Advised via MyChart.

## 2022-05-27 ENCOUNTER — Encounter: Payer: Self-pay | Admitting: *Deleted

## 2022-05-27 LAB — PROTEIN ELECTROPHORESIS, SERUM, WITH REFLEX
A/G Ratio: 1.1 (ref 0.7–1.7)
Albumin ELP: 3.3 g/dL (ref 2.9–4.4)
Alpha-1-Globulin: 0.3 g/dL (ref 0.0–0.4)
Alpha-2-Globulin: 1 g/dL (ref 0.4–1.0)
Beta Globulin: 0.8 g/dL (ref 0.7–1.3)
Gamma Globulin: 0.8 g/dL (ref 0.4–1.8)
Globulin, Total: 2.9 g/dL (ref 2.2–3.9)
M-Spike, %: 0.4 g/dL — ABNORMAL HIGH
SPEP Interpretation: 0
Total Protein ELP: 6.2 g/dL (ref 6.0–8.5)

## 2022-05-27 LAB — IMMUNOFIXATION REFLEX, SERUM
IgA: 88 mg/dL (ref 61–437)
IgG (Immunoglobin G), Serum: 480 mg/dL — ABNORMAL LOW (ref 603–1613)
IgM (Immunoglobulin M), Srm: 627 mg/dL — ABNORMAL HIGH (ref 20–172)

## 2022-05-30 ENCOUNTER — Ambulatory Visit (INDEPENDENT_AMBULATORY_CARE_PROVIDER_SITE_OTHER): Payer: PPO | Admitting: Pulmonary Disease

## 2022-05-30 ENCOUNTER — Encounter (HOSPITAL_BASED_OUTPATIENT_CLINIC_OR_DEPARTMENT_OTHER): Payer: Self-pay | Admitting: Pulmonary Disease

## 2022-05-30 VITALS — BP 122/70 | HR 83 | Temp 97.9°F | Ht 71.0 in | Wt 193.6 lb

## 2022-05-30 DIAGNOSIS — R053 Chronic cough: Secondary | ICD-10-CM | POA: Diagnosis not present

## 2022-05-30 MED ORDER — ALBUTEROL SULFATE HFA 108 (90 BASE) MCG/ACT IN AERS: 2.0000 | INHALATION_SPRAY | Freq: Four times a day (QID) | RESPIRATORY_TRACT | 3 refills | Status: DC | PRN

## 2022-05-30 MED ORDER — PREDNISONE 10 MG PO TABS: ORAL_TABLET | ORAL | 0 refills | Status: AC

## 2022-05-30 MED ORDER — BENZONATATE 200 MG PO CAPS: 200.0000 mg | ORAL_CAPSULE | Freq: Three times a day (TID) | ORAL | 0 refills | Status: DC | PRN

## 2022-05-30 NOTE — Assessment & Plan Note (Signed)
His cough has persisted for more than 3 months after initial episode of COVID infection.  This is likely a postviral cough.  He does not seem to be having an active bacterial infection so no antibiotics are required.  CT chest shows mild bronchiectasis and this may be the reason for his lingering cough.  No reason to suspect MAC or other etiology. he does not seem to have significant postnasal drip or reflux although he does have a moderate hiatal hernia We will treat him with a course of low-dose prednisone since this seems to have helped the best and if cough is persistent after this consider inhaled steroids. He will continue to use albuterol on an as-needed basis. I have asked him to use OTC Delsym and provide him with Ladona Ridgel to use on an as-needed basis.  If cough is persistent for another 4 weeks we will consider inhaled steroids and empiric sequential treatment for upper airway cough syndrome and reflux

## 2022-05-30 NOTE — Progress Notes (Signed)
Subjective:    Patient ID: [name] [name], male    DOB: 1951-01-11, 72 y.o.   MRN: [id number]  HPI  Chief Complaint  Patient presents with   Consult    Pt has had a chronic cough for the past 3 months after he had covid. States the cough is worse in the middle of the night which will wake him up from his sleep but can occasionally have spells throughout the day.   72 year old never smoker presents for evaluation of chronic cough. He reports COVID infection in December following which she developed a cough productive of initially green sputum which is now turned to clear.  He was treated by his PCP with antibiotic and steroids in January. In February, his oncologist gave him another round of antibiotic and steroids he was given albuterol MDI which seems to help a little Mucinex caused him to cough a lot and throw up.  Codeine cough syrup did not help much. 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 ([place]) 01/27/2011   Thyroid disease 2007   hypothyroidism   Past Surgical History:  Procedure Laterality Date   COLONOSCOPY  2008   Dr Fuller Plan   COLONOSCOPY W/ POLYPECTOMY  2014   Dr Fuller Plan   IR IMAGING GUIDED PORT INSERTION  01/26/2022   POLYPECTOMY     PORTACATH  PLACEMENT  Dec 2012   REMOVED spring 2014   TONSILLECTOMY      No Known Allergies  Social History   Socioeconomic History   Marital status: Married    Spouse name: Not on file   Number of children: 2   Years of education: Not on file   Highest education level: Not on file  Occupational History   Occupation: Retired    Comment: Airline pilot  Tobacco Use   Smoking status: Never   Smokeless tobacco: Never   Tobacco comments:    NEVER USED TOBACCO  Vaping Use   Vaping Use: Never used  Substance and Sexual Activity   Alcohol use: Yes    Alcohol/week: 0.0 standard drinks of alcohol    Comment:  1-2 wine / week   Drug use: No   Sexual activity: Not on file  Other Topics Concern   Not on file  Social History Narrative   Exercise:  No regular exercise, active, yardwork   Social Determinants of Health   Financial Resource Strain: Low Risk  (12/04/2020)   Overall Financial Resource Strain (CARDIA)    Difficulty of Paying Living Expenses: Not hard at all  Food Insecurity: No Food Insecurity (12/04/2020)   Hunger Vital Sign    Worried About Running Out of Food in the Last Year: Never true    Ran Out of Food in the Last Year: Never true  Transportation Needs: No Transportation Needs (12/04/2020)   PRAPARE - Hydrologist (Medical): No    Lack of Transportation (Non-Medical): No  Physical Activity: Inactive (12/04/2020)   Exercise Vital Sign    Days of Exercise per Week: 0 days    Minutes of Exercise per Session: 0 min  Stress: No Stress Concern Present (12/04/2020)   Zeigler of Stress : Not at all  Social Connections: [place] (12/04/2020)   Social Connection and Isolation Panel [NHANES]    Frequency of Communication with Friends and Family: More than three times a week    Frequency of Social Gatherings with Friends and Family: More than three times a week     Attends Religious Services: 1 to 4 times per year    Active Member of [employer] or Organizations: Yes    Attends Archivist Meetings: 1 to 4 times per year    Marital Status: Married  Human resources officer Violence: Not At Risk (12/04/2020)   Humiliation, Afraid, Rape, and Kick questionnaire    Fear of Current or Ex-Partner: No    Emotionally Abused: No    Physically Abused: No    Sexually Abused: No    Family History  Problem Relation Age of Onset   Alzheimer's disease Mother    Heart attack Father 23   Diabetes Brother    COPD Brother    Dementia Brother    Stroke Brother    Hypertension Brother         X 3   Heart disease Paternal Grandfather        in 29s   Coronary artery disease Brother        CBAG/vavlve replacement   Cancer Neg Hx    Colon cancer Neg Hx    Colon polyps Neg Hx    Rectal cancer Neg Hx    Stomach cancer Neg Hx    Esophageal cancer Neg Hx      Review of Systems Shortness of breath with activity Cough productive of white sputum Decrease in weight 5 to 10 pounds Sneezing Depression Feet swelling Joint stiffness    Objective:   Physical Exam  Gen. Pleasant, well-nourished, in no distress, normal affect ENT - no pallor,icterus, no post nasal drip Neck: No JVD, no thyromegaly, no carotid bruits Lungs: no use of accessory muscles, no dullness to percussion, crackles left infrascapular, no rhonchi  Cardiovascular: Rhythm regular, heart sounds  normal, no murmurs or gallops, no peripheral edema Abdomen: soft and non-tender, no hepatosplenomegaly, BS normal. Musculoskeletal: No deformities, no cyanosis or clubbing Neuro:  alert, non focal        Assessment & Plan:

## 2022-05-30 NOTE — Patient Instructions (Signed)
X Prednisone 10 mg tabs  Take 2 tabs daily with food x 7ds, then 1 tab daily with food x 7ds then STOP  X Benzonatate 200 mg thrice daily as needed #30  Use albuterol MDI 2 puffs as needed

## 2022-06-16 ENCOUNTER — Inpatient Hospital Stay: Payer: PPO

## 2022-06-16 ENCOUNTER — Inpatient Hospital Stay (HOSPITAL_BASED_OUTPATIENT_CLINIC_OR_DEPARTMENT_OTHER): Payer: PPO | Admitting: Hematology & Oncology

## 2022-06-16 ENCOUNTER — Encounter: Payer: Self-pay | Admitting: Hematology & Oncology

## 2022-06-16 ENCOUNTER — Ambulatory Visit (HOSPITAL_BASED_OUTPATIENT_CLINIC_OR_DEPARTMENT_OTHER)
Admission: RE | Admit: 2022-06-16 | Discharge: 2022-06-16 | Disposition: A | Discharge status: Home / Self Care | Payer: PPO | Source: Ambulatory Visit | Attending: Hematology & Oncology | Admitting: Hematology & Oncology

## 2022-06-16 ENCOUNTER — Other Ambulatory Visit: Payer: Self-pay

## 2022-06-16 VITALS — BP 129/76 | HR 76 | Temp 98.4°F | Resp 17

## 2022-06-16 VITALS — BP 134/76 | HR 92 | Temp 98.2°F | Resp 18 | Ht 71.0 in | Wt 195.0 lb

## 2022-06-16 DIAGNOSIS — M79662 Pain in left lower leg: Secondary | ICD-10-CM | POA: Insufficient documentation

## 2022-06-16 DIAGNOSIS — C8583 Other specified types of non-Hodgkin lymphoma, intra-abdominal lymph nodes: Secondary | ICD-10-CM

## 2022-06-16 DIAGNOSIS — M79605 Pain in left leg: Secondary | ICD-10-CM | POA: Diagnosis not present

## 2022-06-16 DIAGNOSIS — D126 Benign neoplasm of colon, unspecified: Secondary | ICD-10-CM

## 2022-06-16 DIAGNOSIS — Z5112 Encounter for antineoplastic immunotherapy: Secondary | ICD-10-CM | POA: Diagnosis not present

## 2022-06-16 DIAGNOSIS — D508 Other iron deficiency anemias: Secondary | ICD-10-CM

## 2022-06-16 LAB — CBC WITH DIFFERENTIAL (CANCER CENTER ONLY)
Abs Immature Granulocytes: 0.03 10*3/uL (ref 0.00–0.07)
Basophils Absolute: 0 10*3/uL (ref 0.0–0.1)
Basophils Relative: 1 %
Eosinophils Absolute: 0.1 10*3/uL (ref 0.0–0.5)
Eosinophils Relative: 2 %
HCT: 30.2 % — ABNORMAL LOW (ref 39.0–52.0)
Hemoglobin: 10 g/dL — ABNORMAL LOW (ref 13.0–17.0)
Immature Granulocytes: 1 %
Lymphocytes Relative: 14 %
Lymphs Abs: 0.7 10*3/uL (ref 0.7–4.0)
MCH: 31.5 pg (ref 26.0–34.0)
MCHC: 33.1 g/dL (ref 30.0–36.0)
MCV: 95.3 fL (ref 80.0–100.0)
Monocytes Absolute: 1 10*3/uL (ref 0.1–1.0)
Monocytes Relative: 19 %
Neutro Abs: 3.3 10*3/uL (ref 1.7–7.7)
Neutrophils Relative %: 63 %
Platelet Count: 99 10*3/uL — ABNORMAL LOW (ref 150–400)
RBC: 3.17 MIL/uL — ABNORMAL LOW (ref 4.22–5.81)
RDW: 14.8 % (ref 11.5–15.5)
WBC Count: 5.1 10*3/uL (ref 4.0–10.5)
nRBC: 0 % (ref 0.0–0.2)

## 2022-06-16 LAB — RETICULOCYTES
Immature Retic Fract: 16.7 % — ABNORMAL HIGH (ref 2.3–15.9)
RBC.: 3.11 MIL/uL — ABNORMAL LOW (ref 4.22–5.81)
Retic Count, Absolute: 72.2 10*3/uL (ref 19.0–186.0)
Retic Ct Pct: 2.3 % (ref 0.4–3.1)

## 2022-06-16 LAB — CMP (CANCER CENTER ONLY)
ALT: 19 U/L (ref 0–44)
AST: 20 U/L (ref 15–41)
Albumin: 4.1 g/dL (ref 3.5–5.0)
Alkaline Phosphatase: 51 U/L (ref 38–126)
Anion gap: 8 (ref 5–15)
BUN: 22 mg/dL (ref 8–23)
CO2: 28 mmol/L (ref 22–32)
Calcium: 9.2 mg/dL (ref 8.9–10.3)
Chloride: 100 mmol/L (ref 98–111)
Creatinine: 1.37 mg/dL — ABNORMAL HIGH (ref 0.61–1.24)
GFR, Estimated: 56 mL/min — ABNORMAL LOW (ref >60)
Glucose, Bld: 93 mg/dL (ref 70–99)
Potassium: 4.1 mmol/L (ref 3.5–5.1)
Sodium: 136 mmol/L (ref 135–145)
Total Bilirubin: 0.5 mg/dL (ref 0.3–1.2)
Total Protein: 6.3 g/dL — ABNORMAL LOW (ref 6.5–8.1)

## 2022-06-16 LAB — IRON AND IRON BINDING CAPACITY (CC-WL,HP ONLY)
Iron: 62 ug/dL (ref 45–182)
Saturation Ratios: 21 % (ref 17.9–39.5)
TIBC: 294 ug/dL (ref 250–450)
UIBC: 232 ug/dL (ref 117–376)

## 2022-06-16 LAB — FERRITIN: Ferritin: 74 ng/mL (ref 24–336)

## 2022-06-16 LAB — LACTATE DEHYDROGENASE: LDH: 169 U/L (ref 98–192)

## 2022-06-16 MED ORDER — DIPHENHYDRAMINE HCL 25 MG PO CAPS
50.0000 mg | ORAL_CAPSULE | Freq: Once | ORAL | Status: AC
  Administered 2022-06-16: 50 mg via ORAL
  Filled 2022-06-16: qty 2

## 2022-06-16 MED ORDER — SODIUM CHLORIDE 0.9% FLUSH: 10.0000 mL | INTRAVENOUS | Status: DC | PRN

## 2022-06-16 MED ORDER — HEPARIN SOD (PORK) LOCK FLUSH 100 UNIT/ML IV SOLN: 500.0000 [IU] | Freq: Once | INTRAVENOUS | Status: DC | PRN

## 2022-06-16 MED ORDER — SODIUM CHLORIDE 0.9 % IV SOLN
72.0000 mg/m2 | Freq: Once | INTRAVENOUS | Status: AC
  Administered 2022-06-16: 152.5 mg via INTRAVENOUS
  Filled 2022-06-16: qty 6.1

## 2022-06-16 MED ORDER — ACETAMINOPHEN 325 MG PO TABS
650.0000 mg | ORAL_TABLET | Freq: Once | ORAL | Status: AC
  Administered 2022-06-16: 650 mg via ORAL
  Filled 2022-06-16: qty 2

## 2022-06-16 MED ORDER — SODIUM CHLORIDE 0.9% FLUSH
10.0000 mL | Freq: Once | INTRAVENOUS | Status: AC
  Administered 2022-06-16: 10 mL

## 2022-06-16 MED ORDER — SODIUM CHLORIDE 0.9 % IV SOLN
10.0000 mg | Freq: Once | INTRAVENOUS | Status: AC
  Administered 2022-06-16: 10 mg via INTRAVENOUS
  Filled 2022-06-16: qty 10

## 2022-06-16 MED ORDER — PALONOSETRON HCL INJECTION 0.25 MG/5ML
0.2500 mg | Freq: Once | INTRAVENOUS | Status: AC
  Administered 2022-06-16: 0.25 mg via INTRAVENOUS
  Filled 2022-06-16: qty 5

## 2022-06-16 MED ORDER — SODIUM CHLORIDE 0.9 % IV SOLN
375.0000 mg/m2 | Freq: Once | INTRAVENOUS | Status: AC
  Administered 2022-06-16: 800 mg via INTRAVENOUS
  Filled 2022-06-16: qty 50

## 2022-06-16 MED ORDER — SODIUM CHLORIDE 0.9 % IV SOLN: Freq: Once | INTRAVENOUS | Status: AC

## 2022-06-16 NOTE — Patient Instructions (Signed)
[facility]  Discharge Instructions: Thank you for choosing [facility] to provide your oncology and hematology care.   If you have a lab appointment with the [facility], please go directly to the [facility] and check in at the registration area.  Wear comfortable clothing and clothing appropriate for easy access to any Portacath or PICC line.   We strive to give you quality time with your provider. You may need to reschedule your appointment if you arrive late (15 or more minutes).  Arriving late affects you and other patients whose appointments are after yours.  Also, if you miss three or more appointments without notifying the office, you may be dismissed from the clinic at the provider's discretion.      For prescription refill requests, have your pharmacy contact our office and allow 72 hours for refills to be completed.    Today you received the following chemotherapy and/or immunotherapy agents Bendeka, Rituxan       To help prevent nausea and vomiting after your treatment, we encourage you to take your nausea medication as directed.  BELOW ARE SYMPTOMS THAT SHOULD BE REPORTED IMMEDIATELY: *FEVER GREATER THAN 100.4 F (38 C) OR HIGHER *CHILLS OR SWEATING *NAUSEA AND VOMITING THAT IS NOT CONTROLLED WITH YOUR NAUSEA MEDICATION *UNUSUAL SHORTNESS OF BREATH *UNUSUAL BRUISING OR BLEEDING *URINARY PROBLEMS (pain or burning when urinating, or frequent urination) *BOWEL PROBLEMS (unusual diarrhea, constipation, pain near the anus) TENDERNESS IN MOUTH AND THROAT WITH OR WITHOUT PRESENCE OF ULCERS (sore throat, sores in mouth, or a toothache) UNUSUAL RASH, SWELLING OR PAIN  UNUSUAL VAGINAL DISCHARGE OR ITCHING   Items with * indicate a potential emergency and should be followed up as soon as possible or go to the Emergency Department if any problems should occur.  Please show the CHEMOTHERAPY ALERT CARD or IMMUNOTHERAPY ALERT CARD at  check-in to the Emergency Department and triage nurse. Should you have questions after your visit or need to cancel or reschedule your appointment, please contact [facility]  [phone] and follow the prompts.  Office hours are 8:00 a.m. to 4:30 p.m. Monday - Friday. Please note that voicemails left after 4:00 p.m. may not be returned until the following business day.  We are closed weekends and major holidays. You have access to a nurse at all times for urgent questions. Please call the main number to the clinic [phone] and follow the prompts.  For any non-urgent questions, you may also contact your provider using MyChart. We now offer e-Visits for anyone 35 and older to request care online for non-urgent symptoms. For details visit [email or web address].   Also download the MyChart app! Go to the app store, search "MyChart", open the app, select [facility], and log in with your MyChart username and password.

## 2022-06-16 NOTE — Progress Notes (Signed)
Hematology and Oncology Follow Up Visit  [name] [name] [id number] 04/27/1950 72 y.o. 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 is back for follow-up.  He is still having a little bit of a cough.  He did see Pulmonary Medicine.  They thought that he may have had bronchiectasis.  I do think that they may have given him some steroids which may have helped a little bit.  His IgG level is on the lower side at 480 mg/dL.  As such, we may have to think about IVIG for him.  When we last saw him, we did do a CT of the chest.  This was done on 05/23/2022.  This shows some mild bronchiectasis.  He has some bronchial wall thickening.  He has responded to treatment.  His last monoclonal spike showed a level of 0.4 g/dL.  His IgM level was 590 mg/dL.  The Kappa light chain was 8.4 mg/dL.  He has had no fever.  He has had no change in bowel or bladder habits.  There is still may be a little bit of swelling in the lower legs.  He has had no rashes.  There has been no bleeding.  He has had no headache.  Overall, I would say his performance status is probably ECOG 1.    Medications:  Current Outpatient Medications:    benzonatate (TESSALON) 200 MG capsule, Take 1 capsule (200 mg total) by mouth 3 (three) times daily as needed for cough., Disp: 30 capsule, Rfl: 0   dexamethasone (DECADRON) 4 MG tablet, Take 2 tablets (8 mg total) by mouth daily. Start the day after bendamustine chemotherapy for 2 days. Take with food., Disp: 30 tablet, Rfl: 1   famciclovir (FAMVIR) 250 MG tablet, Take 1 tablet (250 mg total) by mouth daily., Disp: 30 tablet, Rfl: 12   furosemide (LASIX) 40 MG tablet, Take 40  mg by mouth daily as needed., Disp: , Rfl:    hydrALAZINE (APRESOLINE) 25 MG tablet, Take 1 tablet (25 mg total) by mouth 3 (three) times daily., Disp: 90 tablet, Rfl: 5   Ipratropium-Albuterol (COMBIVENT) 20-100 MCG/ACT AERS respimat, Inhale 1 puff into the lungs every 6 (six) hours., Disp: 1 each, Rfl: 4   levothyroxine (SYNTHROID) 137 MCG tablet, Take 1 tablet (137 mcg total) by mouth daily before breakfast., Disp: 90 tablet, Rfl: 3   lidocaine-prilocaine (EMLA) cream, Apply to affected area once, Disp: 30 g, Rfl: 3   Melatonin 10 MG SUBL, Place 10 mg under the tongue as needed., Disp: , Rfl:    Multiple Vitamins-Minerals (CENTRUM SILVER 50+MEN PO), Take by mouth., Disp: , Rfl:    ondansetron (ZOFRAN) 8 MG tablet, Take 1 tablet (8 mg total) by mouth every 8 (eight) hours as needed for nausea or vomiting. Start on the third day after chemotherapy., Disp: 30 tablet, Rfl: 1   prochlorperazine (COMPAZINE) 10 MG tablet, Take 1 tablet (10 mg total) by mouth every 6 (six) hours as needed for nausea or vomiting., Disp: 30 tablet, Rfl: 1   rosuvastatin (CRESTOR) 20 MG tablet, TAKE 1 TABLET BY MOUTH EVERY DAY, Disp: 90 tablet, Rfl: 1  venlafaxine XR (EFFEXOR-XR) 37.5 MG 24 hr capsule, TAKE 1 CAPSULE BY MOUTH DAILY WITH BREAKFAST., Disp: 90 capsule, Rfl: 2  Allergies: No Known Allergies  Past Medical History, Surgical history, Social history, and Family History were reviewed and updated.  Review of Systems: Review of Systems  Constitutional: Negative.   HENT:  Negative.    Eyes: Negative.   Respiratory: Negative.    Cardiovascular: Negative.   Gastrointestinal:  Positive for abdominal pain and nausea.  Endocrine: Negative.   Genitourinary: Negative.    Musculoskeletal: Negative.   Skin: Negative.   Neurological: Negative.   Hematological: Negative.   Psychiatric/Behavioral: Negative.      Physical Exam:  height is 5\' 11"  (1.803 m) and weight is 195 lb (88.5 kg). His oral temperature is  98.2 F (36.8 C). His blood pressure is 134/76 and his pulse is 92. His respiration is 18 and oxygen saturation is 99%.   Wt Readings from Last 3 Encounters:  06/16/22 195 lb (88.5 kg)  05/30/22 193 lb 9 oz (87.8 kg)  05/19/22 188 lb 6.4 oz (85.5 kg)    Physical Exam Vitals reviewed.  HENT:     Head: Normocephalic and atraumatic.  Eyes:     Pupils: Pupils are equal, round, and reactive to light.  Cardiovascular:     Rate and Rhythm: Normal rate and regular rhythm.     Heart sounds: Normal heart sounds.  Pulmonary:     Effort: Pulmonary effort is normal.     Breath sounds: Normal breath sounds.  Abdominal:     General: Bowel sounds are normal.     Palpations: Abdomen is soft.  Musculoskeletal:        General: No tenderness or deformity. Normal range of motion.     Cervical back: Normal range of motion.  Lymphadenopathy:     Cervical: No cervical adenopathy.  Skin:    General: Skin is warm and dry.     Findings: No erythema or rash.  Neurological:     Mental Status: He is alert and oriented to person, place, and time.  Psychiatric:        Behavior: Behavior normal.        Thought Content: Thought content normal.        Judgment: Judgment normal.    Lab Results  Component Value Date   WBC 5.1 06/16/2022   HGB 10.0 (L) 06/16/2022   HCT 30.2 (L) 06/16/2022   MCV 95.3 06/16/2022   PLT 99 (L) 06/16/2022     Chemistry      Component Value Date/Time   NA 136 06/16/2022 0803   NA 138 12/06/2019 1501   NA 142 04/25/2016 0844   NA 140 10/19/2015 0903   K 4.1 06/16/2022 0803   K 4.1 04/25/2016 0844   K 4.1 10/19/2015 0903   CL 100 06/16/2022 0803   CL 102 04/25/2016 0844   CO2 28 06/16/2022 0803   CO2 29 04/25/2016 0844   CO2 25 10/19/2015 0903   BUN 22 06/16/2022 0803   BUN 20 12/06/2019 1501   BUN 14 04/25/2016 0844   BUN 21.6 10/19/2015 0903   CREATININE 1.37 (H) 06/16/2022 0803   CREATININE 1.1 04/25/2016 0844   CREATININE 1.3 10/19/2015 0903       Component Value Date/Time   CALCIUM 9.2 06/16/2022 0803   CALCIUM 9.6 04/25/2016 0844   CALCIUM 9.4 10/19/2015 0903   ALKPHOS 51 06/16/2022 0803   ALKPHOS 59 04/25/2016 0844   ALKPHOS 68 10/19/2015  0903   AST 20 06/16/2022 0803   AST 21 10/19/2015 0903   ALT 19 06/16/2022 0803   ALT 27 04/25/2016 0844   ALT 19 10/19/2015 0903   BILITOT 0.5 06/16/2022 0803   BILITOT 0.54 10/19/2015 0903      Impression and Plan: [name] is a 72 year old white male.  We treated him 10 years ago.  He had a marginal zone lymphoma.  He went into clinical remission.  When he initially presented, he had splenomegaly.  When he relapsed, he again had splenomegaly.  We subsequently treated him with Imbruvica and venetoclax.  He completed his treatments back in April.  We now have him on Rituxan/Bendamustine.  His monoclonal studies are improving.  As such, we will continue him on treatment.  I think this will be his 6th cycle.  We are going to have to do a bone marrow biopsy on him.  This is really the best way for [place] to tell how things are working.  His monoclonal studies also help but I think ultimately, the bone marrow biopsy will be best.  We will see about doing a bone marrow biopsy on him in about 3 weeks.  Again, we will see what his IgG level is.  If it is on the low side, we will think about doing some IVIG.  He really has done nicely.  He has shown a lot of tough news.  His wife has been a huge inspiration and motivated for him.  I will plan to see him back in about 4-5 weeks.    [name], MD 3/28/20248:36 AM

## 2022-06-16 NOTE — Patient Instructions (Signed)

## 2022-06-17 ENCOUNTER — Encounter: Payer: Self-pay | Admitting: Hematology & Oncology

## 2022-06-17 ENCOUNTER — Inpatient Hospital Stay: Payer: PPO

## 2022-06-17 VITALS — BP 159/78 | HR 92 | Temp 98.2°F | Resp 17

## 2022-06-17 DIAGNOSIS — Z5112 Encounter for antineoplastic immunotherapy: Secondary | ICD-10-CM | POA: Diagnosis not present

## 2022-06-17 DIAGNOSIS — C8583 Other specified types of non-Hodgkin lymphoma, intra-abdominal lymph nodes: Secondary | ICD-10-CM

## 2022-06-17 LAB — IGG, IGA, IGM
IgA: 74 mg/dL (ref 61–437)
IgG (Immunoglobin G), Serum: 466 mg/dL — ABNORMAL LOW (ref 603–1613)
IgM (Immunoglobulin M), Srm: 477 mg/dL — ABNORMAL HIGH (ref 20–172)

## 2022-06-17 LAB — KAPPA/LAMBDA LIGHT CHAINS
Kappa free light chain: 61.1 mg/L — ABNORMAL HIGH (ref 3.3–19.4)
Kappa, lambda light chain ratio: 7.45 — ABNORMAL HIGH (ref 0.26–1.65)
Lambda free light chains: 8.2 mg/L (ref 5.7–26.3)

## 2022-06-17 MED ORDER — SODIUM CHLORIDE 0.9 % IV SOLN: Freq: Once | INTRAVENOUS | Status: AC

## 2022-06-17 MED ORDER — SODIUM CHLORIDE 0.9% FLUSH
10.0000 mL | INTRAVENOUS | Status: DC | PRN
  Administered 2022-06-17: 10 mL

## 2022-06-17 MED ORDER — SODIUM CHLORIDE 0.9 % IV SOLN
72.0000 mg/m2 | Freq: Once | INTRAVENOUS | Status: AC
  Administered 2022-06-17: 152.5 mg via INTRAVENOUS
  Filled 2022-06-17: qty 6.1

## 2022-06-17 MED ORDER — SODIUM CHLORIDE 0.9 % IV SOLN
10.0000 mg | Freq: Once | INTRAVENOUS | Status: AC
  Administered 2022-06-17: 10 mg via INTRAVENOUS
  Filled 2022-06-17: qty 10

## 2022-06-17 MED ORDER — HEPARIN SOD (PORK) LOCK FLUSH 100 UNIT/ML IV SOLN
500.0000 [IU] | Freq: Once | INTRAVENOUS | Status: AC | PRN
  Administered 2022-06-17: 500 [IU]

## 2022-06-17 NOTE — Patient Instructions (Signed)
[facility]  Discharge Instructions: Thank you for choosing [facility] to provide your oncology and hematology care.   If you have a lab appointment with the Cancer Center, please go directly to the Cancer Center and check in at the registration area.  Wear comfortable clothing and clothing appropriate for easy access to any Portacath or PICC line.   We strive to give you quality time with your provider. You may need to reschedule your appointment if you arrive late (15 or more minutes).  Arriving late affects you and other patients whose appointments are after yours.  Also, if you miss three or more appointments without notifying the office, you may be dismissed from the clinic at the provider's discretion.      For prescription refill requests, have your pharmacy contact our office and allow 72 hours for refills to be completed.    Today you received the following chemotherapy and/or immunotherapy agents Bendeka.      To help prevent nausea and vomiting after your treatment, we encourage you to take your nausea medication as directed.  BELOW ARE SYMPTOMS THAT SHOULD BE REPORTED IMMEDIATELY: *FEVER GREATER THAN 100.4 F (38 C) OR HIGHER *CHILLS OR SWEATING *NAUSEA AND VOMITING THAT IS NOT CONTROLLED WITH YOUR NAUSEA MEDICATION *UNUSUAL SHORTNESS OF BREATH *UNUSUAL BRUISING OR BLEEDING *URINARY PROBLEMS (pain or burning when urinating, or frequent urination) *BOWEL PROBLEMS (unusual diarrhea, constipation, pain near the anus) TENDERNESS IN MOUTH AND THROAT WITH OR WITHOUT PRESENCE OF ULCERS (sore throat, sores in mouth, or a toothache) UNUSUAL RASH, SWELLING OR PAIN  UNUSUAL VAGINAL DISCHARGE OR ITCHING   Items with * indicate a potential emergency and should be followed up as soon as possible or go to the Emergency Department if any problems should occur.  Please show the CHEMOTHERAPY ALERT CARD or IMMUNOTHERAPY ALERT CARD at check-in  to the Emergency Department and triage nurse. Should you have questions after your visit or need to cancel or reschedule your appointment, please contact [facility]  [phone] and follow the prompts.  Office hours are 8:00 a.m. to 4:30 p.m. Monday - Friday. Please note that voicemails left after 4:00 p.m. may not be returned until the following business day.  We are closed weekends and major holidays. You have access to a nurse at all times for urgent questions. Please call the main number to the clinic [phone] and follow the prompts.  For any non-urgent questions, you may also contact your provider using MyChart. We now offer e-Visits for anyone 18 and older to request care online for non-urgent symptoms. For details visit [email or web address].   Also download the MyChart app! Go to the app store, search "MyChart", open the app, select [facility], and log in with your MyChart username and password.   

## 2022-06-20 ENCOUNTER — Encounter: Payer: Self-pay | Admitting: *Deleted

## 2022-06-21 ENCOUNTER — Other Ambulatory Visit: Payer: Self-pay

## 2022-06-23 ENCOUNTER — Other Ambulatory Visit: Payer: Self-pay

## 2022-06-23 LAB — IMMUNOFIXATION REFLEX, SERUM
IgA: 80 mg/dL (ref 61–437)
IgG (Immunoglobin G), Serum: 487 mg/dL — ABNORMAL LOW (ref 603–1613)
IgM (Immunoglobulin M), Srm: 507 mg/dL — ABNORMAL HIGH (ref 20–172)

## 2022-06-23 LAB — PROTEIN ELECTROPHORESIS, SERUM, WITH REFLEX
A/G Ratio: 1.2 (ref 0.7–1.7)
Albumin ELP: 3.3 g/dL (ref 2.9–4.4)
Alpha-1-Globulin: 0.3 g/dL (ref 0.0–0.4)
Alpha-2-Globulin: 0.9 g/dL (ref 0.4–1.0)
Beta Globulin: 0.8 g/dL (ref 0.7–1.3)
Gamma Globulin: 0.8 g/dL (ref 0.4–1.8)
Globulin, Total: 2.8 g/dL (ref 2.2–3.9)
M-Spike, %: 0.3 g/dL — ABNORMAL HIGH
SPEP Interpretation: 0
Total Protein ELP: 6.1 g/dL (ref 6.0–8.5)

## 2022-06-24 ENCOUNTER — Other Ambulatory Visit: Payer: Self-pay | Admitting: Radiology

## 2022-06-24 DIAGNOSIS — C8583 Other specified types of non-Hodgkin lymphoma, intra-abdominal lymph nodes: Secondary | ICD-10-CM

## 2022-06-27 ENCOUNTER — Ambulatory Visit (HOSPITAL_COMMUNITY)
Admission: RE | Admit: 2022-06-27 | Discharge: 2022-06-27 | Disposition: A | Discharge status: Home / Self Care | Payer: PPO | Source: Ambulatory Visit | Attending: Hematology & Oncology | Admitting: Hematology & Oncology

## 2022-06-27 ENCOUNTER — Encounter (HOSPITAL_COMMUNITY): Payer: Self-pay

## 2022-06-27 ENCOUNTER — Other Ambulatory Visit: Payer: Self-pay

## 2022-06-27 DIAGNOSIS — Z9221 Personal history of antineoplastic chemotherapy: Secondary | ICD-10-CM | POA: Diagnosis not present

## 2022-06-27 DIAGNOSIS — Z1379 Encounter for other screening for genetic and chromosomal anomalies: Secondary | ICD-10-CM | POA: Insufficient documentation

## 2022-06-27 DIAGNOSIS — C8590 Non-Hodgkin lymphoma, unspecified, unspecified site: Secondary | ICD-10-CM | POA: Diagnosis not present

## 2022-06-27 DIAGNOSIS — C8583 Other specified types of non-Hodgkin lymphoma, intra-abdominal lymph nodes: Secondary | ICD-10-CM | POA: Diagnosis not present

## 2022-06-27 HISTORY — PX: IR BONE MARROW BIOPSY & ASPIRATION: IMG5727

## 2022-06-27 LAB — CBC WITH DIFFERENTIAL/PLATELET
Abs Immature Granulocytes: 0.02 10*3/uL (ref 0.00–0.07)
Basophils Absolute: 0 10*3/uL (ref 0.0–0.1)
Basophils Relative: 0 %
Eosinophils Absolute: 0.1 10*3/uL (ref 0.0–0.5)
Eosinophils Relative: 3 %
HCT: 28.1 % — ABNORMAL LOW (ref 39.0–52.0)
Hemoglobin: 9.2 g/dL — ABNORMAL LOW (ref 13.0–17.0)
Immature Granulocytes: 1 %
Lymphocytes Relative: 7 %
Lymphs Abs: 0.2 10*3/uL — ABNORMAL LOW (ref 0.7–4.0)
MCH: 31.8 pg (ref 26.0–34.0)
MCHC: 32.7 g/dL (ref 30.0–36.0)
MCV: 97.2 fL (ref 80.0–100.0)
Monocytes Absolute: 0.6 10*3/uL (ref 0.1–1.0)
Monocytes Relative: 22 %
Neutro Abs: 1.8 10*3/uL (ref 1.7–7.7)
Neutrophils Relative %: 67 %
Platelets: 77 10*3/uL — ABNORMAL LOW (ref 150–400)
RBC: 2.89 MIL/uL — ABNORMAL LOW (ref 4.22–5.81)
RDW: 14.5 % (ref 11.5–15.5)
WBC: 2.7 10*3/uL — ABNORMAL LOW (ref 4.0–10.5)
nRBC: 0 % (ref 0.0–0.2)

## 2022-06-27 MED ORDER — MIDAZOLAM HCL 2 MG/2ML IJ SOLN
INTRAMUSCULAR | Status: AC | PRN
  Administered 2022-06-27 (×2): 1 mg via INTRAVENOUS

## 2022-06-27 MED ORDER — FENTANYL CITRATE (PF) 100 MCG/2ML IJ SOLN
INTRAMUSCULAR | Status: AC
  Filled 2022-06-27: qty 2

## 2022-06-27 MED ORDER — MIDAZOLAM HCL 2 MG/2ML IJ SOLN
INTRAMUSCULAR | Status: AC
  Filled 2022-06-27: qty 2

## 2022-06-27 MED ORDER — SODIUM CHLORIDE 0.9 % IV SOLN: INTRAVENOUS | Status: DC

## 2022-06-27 MED ORDER — FENTANYL CITRATE (PF) 100 MCG/2ML IJ SOLN
INTRAMUSCULAR | Status: AC | PRN
  Administered 2022-06-27 (×2): 50 ug via INTRAVENOUS

## 2022-06-27 MED ORDER — HEPARIN SOD (PORK) LOCK FLUSH 100 UNIT/ML IV SOLN
500.0000 [IU] | Freq: Once | INTRAVENOUS | Status: AC
  Administered 2022-06-27: 500 [IU] via INTRAVENOUS
  Filled 2022-06-27: qty 5

## 2022-06-27 MED ORDER — LIDOCAINE HCL (PF) 1 % IJ SOLN
30.0000 mL | Freq: Once | INTRAMUSCULAR | Status: AC
  Administered 2022-06-27: 8 mL via INTRADERMAL

## 2022-06-27 MED ORDER — LIDOCAINE HCL (PF) 1 % IJ SOLN
INTRAMUSCULAR | Status: AC
  Filled 2022-06-27: qty 30

## 2022-06-27 NOTE — Procedures (Signed)
Interventional Radiology Procedure Note  Procedure: CT guided aspirate and core biopsy of LEFT iliac bone Complications: None Recommendations: - Bedrest supine x 1 hrs - Hydrocodone PRN  Pain - Follow biopsy results  Signed,  Arwa Yero K. Rehanna Oloughlin, MD    

## 2022-06-27 NOTE — H&P (Signed)
Referring Physician(s): Ennever,Peter R  Supervising Physician: Malachy Moan  Patient Status:  [name] [name]  Chief Complaint: "I'm getting a bone marrow biopsy"   Subjective: Pt known to IR team from port a cath placement in 2012, removal in 2014, BM bx on 12/16/21 and port a cath placement on 01/26/22. He has a hx of recurrent marginal zone lymphoma, s/p chemo. He presents again today for image guided bone marrow biopsy to assess treatment response. He denies HA,CP,dyspnea, back pain, vomiting or bleeding. He has had recent temp elevation, cough, occ abd discomfort. Additional med hx as below. Pt had COVID in Nov 2023.      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  Medications: Prior to Admission medications   Medication Sig Start Date End Date Taking? Authorizing Provider  benzonatate (TESSALON) 200 MG capsule Take 1 capsule (200 mg total) by mouth 3 (three) times daily as needed for cough. 05/30/22   Oretha Milch, MD  dexamethasone (DECADRON) 4 MG tablet Take 2 tablets (8 mg total) by mouth daily. Start the day after bendamustine chemotherapy for 2 days. Take with food. 01/20/22   [name], MD  famciclovir (FAMVIR) 250 MG tablet Take 1 tablet (250 mg total) by mouth daily. 01/18/22   [name], MD  furosemide (LASIX) 40 MG tablet Take 40 mg by mouth daily as needed. 05/04/20   [provider]  hydrALAZINE (APRESOLINE) 25 MG tablet Take 1 tablet (25 mg  total) by mouth 3 (three) times daily. 04/07/22   Burns, Bobette Mo, MD  Ipratropium-Albuterol (COMBIVENT) 20-100 MCG/ACT AERS respimat Inhale 1 puff into the lungs every 6 (six) hours. 05/19/22   [name], MD  levothyroxine (SYNTHROID) 137 MCG tablet Take 1 tablet (137 mcg total) by mouth daily before breakfast. 04/25/22   Pincus Sanes, MD  lidocaine-prilocaine (EMLA) cream Apply to affected area once 01/20/22   [name], MD  Melatonin 10 MG SUBL Place 10 mg under the tongue as needed.    [provider]  Multiple Vitamins-Minerals (CENTRUM SILVER 50+MEN PO) Take by mouth.    [provider]  ondansetron (ZOFRAN) 8 MG tablet Take 1 tablet (8 mg total) by mouth every 8 (eight) hours as needed for nausea or vomiting. Start on the third day after chemotherapy. 01/20/22   [name], MD  prochlorperazine (COMPAZINE) 10 MG tablet Take 1 tablet (10 mg total) by mouth every 6 (six) hours as needed for nausea or vomiting. 01/20/22   [name], MD  rosuvastatin (CRESTOR) 20 MG tablet TAKE 1 TABLET BY MOUTH EVERY DAY 04/07/22   Pincus Sanes, MD  venlafaxine XR (EFFEXOR-XR) 37.5 MG 24 hr capsule TAKE 1 CAPSULE BY MOUTH DAILY WITH BREAKFAST. 02/09/22   [name], MD     Vital Signs:pending    Physical Exam: awake/alert; chest- sl  dim BS bases; heart- RRR; abd- soft,+BS,NT;trace pretibial edema bilat  Imaging: No results found.  Labs:  CBC: Recent Labs    03/23/22 0933 04/21/22 0810 05/19/22 0810 06/16/22 0803  WBC 4.9 2.7* 2.8* 5.1  HGB 9.6* 9.6* 9.4* 10.0*  HCT 29.3* 28.7* 28.3* 30.2*  PLT 154 109* 130* 99*    COAGS: No results for input(s): "INR", "APTT" in the last 8760 hours.  BMP: Recent Labs    03/23/22 0933 04/21/22 0810 05/19/22 0810 06/16/22 0803  NA 135 136 138 136  K 3.9 3.8 3.7 4.1  CL 98 101 103 100  CO2 29 25 27 28   GLUCOSE 107* 125* 98 93  BUN 25* 19 17 22   CALCIUM 9.5 9.8 9.7 9.2  CREATININE 1.30* 1.28* 1.45*  1.37*  GFRNONAA 59* >60 52* 56*    LIVER FUNCTION TESTS: Recent Labs    03/23/22 0933 04/21/22 0810 05/19/22 0810 06/16/22 0803  BILITOT 0.5 0.5 0.4 0.5  AST 27 17 21 20   ALT 45* 14 16 19   ALKPHOS 59 57 61 51  PROT 7.2 7.5 6.6 6.3*  ALBUMIN 3.9 4.1 4.0 4.1    Assessment and Plan: 72 yo male with hx of recurrent marginal zone lymphoma, s/p chemo. Scheduled today for image guided bone marrow biopsy to assess treatment response.Risks and benefits of procedure was discussed with the patient  including, but not limited to bleeding, infection, damage to adjacent structures or low yield requiring additional tests.  All of the questions were answered and there is agreement to proceed.  Consent signed and in chart.    Electronically Signed: D. Jeananne Rama, PA-C 06/27/2022, 9:05 AM   I spent a total of 20 minutes at the the patient's bedside AND on the patient's hospital floor or unit, greater than 50% of which was counseling/coordinating care for image guided bone marrow biopsy

## 2022-06-27 NOTE — Discharge Instructions (Signed)
Please call Interventional Radiology clinic [phone] with any questions or concerns. ? ?You may remove your dressing and shower tomorrow. ? ? ?Bone Marrow Aspiration and Bone Marrow Biopsy, Adult, Care After ?This sheet gives you information about how to care for yourself after your procedure. Your health care provider may also give you more specific instructions. If you have problems or questions, contact your health care provider. ?What can I expect after the procedure? ?After the procedure, it is common to have: ?Mild pain and tenderness. ?Swelling. ?Bruising. ?Follow these instructions at home: ?Puncture site care ?Follow instructions from your health care provider about how to take care of the puncture site. Make sure you: ?Wash your hands with soap and water before and after you change your bandage (dressing). If soap and water are not available, use hand sanitizer. ?Change your dressing as told by your health care provider. ?Check your puncture site every day for signs of infection. Check for: ?More redness, swelling, or pain. ?Fluid or blood. ?Warmth. ?Pus or a bad smell.   ?Activity ?Return to your normal activities as told by your health care provider. Ask your health care provider what activities are safe for you. ?Do not lift anything that is heavier than 10 lb (4.5 kg), or the limit that you are told, until your health care provider says that it is safe. ?Do not drive for 24 hours if you were given a sedative during your procedure. ?General instructions ?Take over-the-counter and prescription medicines only as told by your health care provider. ?Do not take baths, swim, or use a hot tub until your health care provider approves. Ask your health care provider if you may take showers. You may only be allowed to take sponge baths. ?If directed, put ice on the affected area. To do this: ?Put ice in a plastic bag. ?Place a towel between your skin and the bag. ?Leave the ice on for 20 minutes, 2-3 times a  day. ?Keep all follow-up visits as told by your health care provider. This is important.   ?Contact a health care provider if: ?Your pain is not controlled with medicine. ?You have a fever. ?You have more redness, swelling, or pain around the puncture site. ?You have fluid or blood coming from the puncture site. ?Your puncture site feels warm to the touch. ?You have pus or a bad smell coming from the puncture site. ?Summary ?After the procedure, it is common to have mild pain, tenderness, swelling, and bruising. ?Follow instructions from your health care provider about how to take care of the puncture site and what activities are safe for you. ?Take over-the-counter and prescription medicines only as told by your health care provider. ?Contact a health care provider if you have any signs of infection, such as fluid or blood coming from the puncture site. ?This information is not intended to replace advice given to you by your health care provider. Make sure you discuss any questions you have with your health care provider. ?Document Revised: 07/24/2018 Document Reviewed: 07/24/2018 ?Elsevier Patient Education ? 2021 Elsevier Inc. ? ? ?Moderate Conscious Sedation, Adult, Care After ?This sheet gives you information about how to care for yourself after your procedure. Your health care provider may also give you more specific instructions. If you have problems or questions, contact your health care provider. ?What can I expect after the procedure? ?After the procedure, it is common to have: ?Sleepiness for several hours. ?Impaired judgment for several hours. ?Difficulty with balance. ?Vomiting if you eat too   soon. ?Follow these instructions at home: ?For the time period you were told by your health care provider: ?Rest. ?Do not participate in activities where you could fall or become injured. ?Do not drive or use machinery. ?Do not drink alcohol. ?Do not take sleeping pills or medicines that cause drowsiness. ?Do not  make important decisions or sign legal documents. ?Do not take care of children on your own.  ?  ?  ?Eating and drinking ?Follow the diet recommended by your health care provider. ?Drink enough fluid to keep your urine pale yellow. ?If you vomit: ?Drink water, juice, or soup when you can drink without vomiting. ?Make sure you have little or no nausea before eating solid foods.   ?General instructions ?Take over-the-counter and prescription medicines only as told by your health care provider. ?Have a responsible adult stay with you for the time you are told. It is important to have someone help care for you until you are awake and alert. ?Do not smoke. ?Keep all follow-up visits as told by your health care provider. This is important. ?Contact a health care provider if: ?You are still sleepy or having trouble with balance after 24 hours. ?You feel light-headed. ?You keep feeling nauseous or you keep vomiting. ?You develop a rash. ?You have a fever. ?You have redness or swelling around the IV site. ?Get help right away if: ?You have trouble breathing. ?You have new-onset confusion at home. ?Summary ?After the procedure, it is common to feel sleepy, have impaired judgment, or feel nauseous if you eat too soon. ?Rest after you get home. Know the things you should not do after the procedure. ?Follow the diet recommended by your health care provider and drink enough fluid to keep your urine pale yellow. ?Get help right away if you have trouble breathing or new-onset confusion at home. ?This information is not intended to replace advice given to you by your health care provider. Make sure you discuss any questions you have with your health care provider. ?Document Revised: 07/05/2019 Document Reviewed: 01/31/2019 ?Elsevier Patient Education ? 2021 Elsevier Inc.  ?

## 2022-06-27 NOTE — Sedation Documentation (Signed)
Sample number 2 obtained

## 2022-06-27 NOTE — Sedation Documentation (Signed)
Sample number 1 obtained

## 2022-06-28 ENCOUNTER — Other Ambulatory Visit: Payer: Self-pay

## 2022-06-29 LAB — SURGICAL PATHOLOGY

## 2022-06-30 ENCOUNTER — Other Ambulatory Visit: Payer: Self-pay | Admitting: *Deleted

## 2022-06-30 ENCOUNTER — Inpatient Hospital Stay: Payer: PPO | Attending: Hematology & Oncology

## 2022-06-30 ENCOUNTER — Ambulatory Visit (HOSPITAL_BASED_OUTPATIENT_CLINIC_OR_DEPARTMENT_OTHER)
Admission: RE | Admit: 2022-06-30 | Discharge: 2022-06-30 | Disposition: A | Discharge status: Home / Self Care | Payer: PPO | Source: Ambulatory Visit | Attending: Hematology & Oncology | Admitting: Hematology & Oncology

## 2022-06-30 ENCOUNTER — Telehealth: Payer: Self-pay | Admitting: *Deleted

## 2022-06-30 ENCOUNTER — Inpatient Hospital Stay: Payer: PPO

## 2022-06-30 DIAGNOSIS — N1831 Chronic kidney disease, stage 3a: Secondary | ICD-10-CM | POA: Insufficient documentation

## 2022-06-30 DIAGNOSIS — D126 Benign neoplasm of colon, unspecified: Secondary | ICD-10-CM | POA: Diagnosis not present

## 2022-06-30 DIAGNOSIS — R058 Other specified cough: Secondary | ICD-10-CM | POA: Diagnosis not present

## 2022-06-30 DIAGNOSIS — J479 Bronchiectasis, uncomplicated: Secondary | ICD-10-CM | POA: Diagnosis not present

## 2022-06-30 DIAGNOSIS — R509 Fever, unspecified: Secondary | ICD-10-CM | POA: Diagnosis not present

## 2022-06-30 DIAGNOSIS — C8583 Other specified types of non-Hodgkin lymphoma, intra-abdominal lymph nodes: Secondary | ICD-10-CM | POA: Diagnosis not present

## 2022-06-30 DIAGNOSIS — D631 Anemia in chronic kidney disease: Secondary | ICD-10-CM | POA: Insufficient documentation

## 2022-06-30 DIAGNOSIS — R059 Cough, unspecified: Secondary | ICD-10-CM

## 2022-06-30 DIAGNOSIS — K449 Diaphragmatic hernia without obstruction or gangrene: Secondary | ICD-10-CM | POA: Diagnosis not present

## 2022-06-30 DIAGNOSIS — D508 Other iron deficiency anemias: Secondary | ICD-10-CM | POA: Diagnosis not present

## 2022-06-30 DIAGNOSIS — Z79899 Other long term (current) drug therapy: Secondary | ICD-10-CM | POA: Insufficient documentation

## 2022-06-30 LAB — CMP (CANCER CENTER ONLY)
ALT: 16 U/L (ref 0–44)
AST: 22 U/L (ref 15–41)
Albumin: 4 g/dL (ref 3.5–5.0)
Alkaline Phosphatase: 54 U/L (ref 38–126)
Anion gap: 10 (ref 5–15)
BUN: 18 mg/dL (ref 8–23)
CO2: 26 mmol/L (ref 22–32)
Calcium: 9.3 mg/dL (ref 8.9–10.3)
Chloride: 99 mmol/L (ref 98–111)
Creatinine: 1.36 mg/dL — ABNORMAL HIGH (ref 0.61–1.24)
GFR, Estimated: 56 mL/min — ABNORMAL LOW (ref >60)
Glucose, Bld: 115 mg/dL — ABNORMAL HIGH (ref 70–99)
Potassium: 3.7 mmol/L (ref 3.5–5.1)
Sodium: 135 mmol/L (ref 135–145)
Total Bilirubin: 0.4 mg/dL (ref 0.3–1.2)
Total Protein: 7.2 g/dL (ref 6.5–8.1)

## 2022-06-30 LAB — CBC WITH DIFFERENTIAL (CANCER CENTER ONLY)
Abs Immature Granulocytes: 0.02 10*3/uL (ref 0.00–0.07)
Basophils Absolute: 0 10*3/uL (ref 0.0–0.1)
Basophils Relative: 1 %
Eosinophils Absolute: 0 10*3/uL (ref 0.0–0.5)
Eosinophils Relative: 1 %
HCT: 29.3 % — ABNORMAL LOW (ref 39.0–52.0)
Hemoglobin: 10 g/dL — ABNORMAL LOW (ref 13.0–17.0)
Immature Granulocytes: 1 %
Lymphocytes Relative: 14 %
Lymphs Abs: 0.4 10*3/uL — ABNORMAL LOW (ref 0.7–4.0)
MCH: 32.2 pg (ref 26.0–34.0)
MCHC: 34.1 g/dL (ref 30.0–36.0)
MCV: 94.2 fL (ref 80.0–100.0)
Monocytes Absolute: 0.7 10*3/uL (ref 0.1–1.0)
Monocytes Relative: 26 %
Neutro Abs: 1.6 10*3/uL — ABNORMAL LOW (ref 1.7–7.7)
Neutrophils Relative %: 57 %
Platelet Count: 86 10*3/uL — ABNORMAL LOW (ref 150–400)
RBC: 3.11 MIL/uL — ABNORMAL LOW (ref 4.22–5.81)
RDW: 14.1 % (ref 11.5–15.5)
WBC Count: 2.7 10*3/uL — ABNORMAL LOW (ref 4.0–10.5)
nRBC: 0 % (ref 0.0–0.2)

## 2022-06-30 MED ORDER — MOXIFLOXACIN HCL 400 MG PO TABS: 400.0000 mg | ORAL_TABLET | Freq: Every day | ORAL | 0 refills | Status: DC

## 2022-06-30 NOTE — Telephone Encounter (Signed)
Call received from patient's wife stating that pt had a temp of 101.8 last night, continues to have a productive cough with green phlegm and decreased appetite and is requesting BM biopsy results.  Dr. Myna Hidalgo notified.  Call placed back to Randa Evens and [facility] notified per order of Dr. Myna Hidalgo to bring pt in for a CXR and lab work.  Randa Evens states that she can have pt here at 1:30PM.  Message sent to scheduling.

## 2022-06-30 NOTE — Patient Instructions (Signed)

## 2022-07-01 ENCOUNTER — Other Ambulatory Visit: Payer: Self-pay

## 2022-07-02 ENCOUNTER — Other Ambulatory Visit: Payer: Self-pay | Admitting: Internal Medicine

## 2022-07-06 ENCOUNTER — Encounter (HOSPITAL_COMMUNITY): Payer: Self-pay | Admitting: Hematology & Oncology

## 2022-07-14 ENCOUNTER — Inpatient Hospital Stay: Payer: PPO

## 2022-07-14 ENCOUNTER — Encounter: Payer: Self-pay | Admitting: Hematology & Oncology

## 2022-07-14 ENCOUNTER — Inpatient Hospital Stay (HOSPITAL_BASED_OUTPATIENT_CLINIC_OR_DEPARTMENT_OTHER): Payer: PPO | Admitting: Hematology & Oncology

## 2022-07-14 VITALS — BP 148/73 | HR 90 | Temp 99.1°F | Resp 18 | Wt 193.0 lb

## 2022-07-14 DIAGNOSIS — C8583 Other specified types of non-Hodgkin lymphoma, intra-abdominal lymph nodes: Secondary | ICD-10-CM

## 2022-07-14 DIAGNOSIS — D631 Anemia in chronic kidney disease: Secondary | ICD-10-CM | POA: Diagnosis not present

## 2022-07-14 DIAGNOSIS — Z95828 Presence of other vascular implants and grafts: Secondary | ICD-10-CM

## 2022-07-14 DIAGNOSIS — N1831 Chronic kidney disease, stage 3a: Secondary | ICD-10-CM | POA: Diagnosis not present

## 2022-07-14 HISTORY — DX: Anemia in chronic kidney disease: D63.1

## 2022-07-14 LAB — CBC WITH DIFFERENTIAL (CANCER CENTER ONLY)
Abs Immature Granulocytes: 0.01 10*3/uL (ref 0.00–0.07)
Basophils Absolute: 0 10*3/uL (ref 0.0–0.1)
Basophils Relative: 1 %
Eosinophils Absolute: 0.1 10*3/uL (ref 0.0–0.5)
Eosinophils Relative: 2 %
HCT: 26.9 % — ABNORMAL LOW (ref 39.0–52.0)
Hemoglobin: 9.1 g/dL — ABNORMAL LOW (ref 13.0–17.0)
Immature Granulocytes: 0 %
Lymphocytes Relative: 34 %
Lymphs Abs: 1.1 10*3/uL (ref 0.7–4.0)
MCH: 31.9 pg (ref 26.0–34.0)
MCHC: 33.8 g/dL (ref 30.0–36.0)
MCV: 94.4 fL (ref 80.0–100.0)
Monocytes Absolute: 1 10*3/uL (ref 0.1–1.0)
Monocytes Relative: 33 %
Neutro Abs: 0.9 10*3/uL — ABNORMAL LOW (ref 1.7–7.7)
Neutrophils Relative %: 30 %
Platelet Count: 83 10*3/uL — ABNORMAL LOW (ref 150–400)
RBC: 2.85 MIL/uL — ABNORMAL LOW (ref 4.22–5.81)
RDW: 14.2 % (ref 11.5–15.5)
WBC Count: 3.1 10*3/uL — ABNORMAL LOW (ref 4.0–10.5)
nRBC: 0 % (ref 0.0–0.2)

## 2022-07-14 LAB — CMP (CANCER CENTER ONLY)
ALT: 15 U/L (ref 0–44)
AST: 20 U/L (ref 15–41)
Albumin: 3.8 g/dL (ref 3.5–5.0)
Alkaline Phosphatase: 54 U/L (ref 38–126)
Anion gap: 8 (ref 5–15)
BUN: 20 mg/dL (ref 8–23)
CO2: 27 mmol/L (ref 22–32)
Calcium: 9.1 mg/dL (ref 8.9–10.3)
Chloride: 106 mmol/L (ref 98–111)
Creatinine: 1.48 mg/dL — ABNORMAL HIGH (ref 0.61–1.24)
GFR, Estimated: 51 mL/min — ABNORMAL LOW (ref >60)
Glucose, Bld: 88 mg/dL (ref 70–99)
Potassium: 3.9 mmol/L (ref 3.5–5.1)
Sodium: 141 mmol/L (ref 135–145)
Total Bilirubin: 0.4 mg/dL (ref 0.3–1.2)
Total Protein: 6.2 g/dL — ABNORMAL LOW (ref 6.5–8.1)

## 2022-07-14 LAB — LACTATE DEHYDROGENASE: LDH: 228 U/L — ABNORMAL HIGH (ref 98–192)

## 2022-07-14 MED ORDER — SODIUM CHLORIDE 0.9% FLUSH
10.0000 mL | INTRAVENOUS | Status: DC | PRN
  Administered 2022-07-14: 10 mL via INTRAVENOUS

## 2022-07-14 MED ORDER — HEPARIN SOD (PORK) LOCK FLUSH 100 UNIT/ML IV SOLN
500.0000 [IU] | Freq: Once | INTRAVENOUS | Status: AC
  Administered 2022-07-14: 500 [IU] via INTRAVENOUS

## 2022-07-14 NOTE — Progress Notes (Signed)
**Note [name]-Identified via Obfuscation** Hematology and Oncology Follow Up Visit  Fowler Antos [id number] 18-Apr-1950 72 y.o. 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 is back for follow-up.  He is doing a whole lot better with his lungs.  We did give him some Avelox when we last saw him.  This seemed to work quite well.  We have not had to give any IVIG.  I am just happy that his lungs are doing better.  As far as his marginal zone lymphoma is concerned, we have done a great job with treatment.  We did go ahead and do a bone marrow biopsy on him.  This was done on 06/27/2022.  The pathology report [id number]) showed no evidence of lymphoma.  There is no monoclonal B-cell population.  As such, I do not think he needs any further chemotherapy.  I do not think we need any type of maintenance therapy for him.  When we last saw him, his monoclonal spike was 0.3 g/dL.  His eye GM level was 490 mg/dL.  The Kappa light chain was 6.1 mg/dL.  I think the issue right now is his anemia.  We did do a fairly thorough workup on him.  He has a borderline iron level.  However, I think that his erythropoietin level is on the low side.  His erythropoietin level is 38.  Given his renal insufficiency, I think this is low.  His corrected reticulocyte count is about 1.5%.  I think this is on the low side.  Think be worthwhile trying to give him some ESA.  I think this probably would work.  I talked to he and his wife about this.  I explained how ESA works.  I think that it  would help his hemoglobin up so he would feel better.  He is still quite tired.  He has very little stamina.  He has some edema in his legs.  I think a lot of this could be resolved with his hemoglobin improving.  We will try to see if insurance will approve Aranesp at 300 mcg every 3 weeks.  He has had a decent appetite.  He has had no problems with bowels or bladder.  He does see Nephrology for his renal insufficiency.  Overall, I would say that his performance status is probably ECOG 1-2.  Medications:  Current Outpatient Medications:    famciclovir (FAMVIR) 250 MG tablet, Take 1 tablet (250 mg total) by mouth daily., Disp: 30 tablet, Rfl: 12   hydrALAZINE (APRESOLINE) 25 MG tablet, Take 1 tablet (25 mg total) by mouth 3 (three) times daily., Disp: 90 tablet, Rfl: 5   levothyroxine (SYNTHROID) 137 MCG tablet, Take 1 tablet (137 mcg total) by mouth daily before breakfast., Disp: 90 tablet, Rfl: 3   lidocaine-prilocaine (EMLA) cream, Apply to affected area once, Disp: 30 g, Rfl: 3   Multiple Vitamins-Minerals (CENTRUM SILVER 50+MEN PO), Take by mouth., Disp: , Rfl:    rosuvastatin (CRESTOR) 20 MG tablet, TAKE 1 TABLET BY MOUTH  EVERY DAY, Disp: 90 tablet, Rfl: 1   venlafaxine XR (EFFEXOR-XR) 37.5 MG 24 hr capsule, TAKE 1 CAPSULE BY MOUTH DAILY WITH BREAKFAST., Disp: 90 capsule, Rfl: 2   benzonatate (TESSALON) 200 MG capsule, Take 1 capsule (200 mg total) by mouth 3 (three) times daily as needed for cough., Disp: 30 capsule, Rfl: 0   dexamethasone (DECADRON) 4 MG tablet, Take 2 tablets (8 mg total) by mouth daily. Start the day after bendamustine chemotherapy for 2 days. Take with food. (Patient not taking: Reported on 07/14/2022), Disp: 30 tablet, Rfl: 1   furosemide (LASIX) 40 MG tablet, Take 40 mg by mouth daily as needed. (Patient not taking: Reported on 07/14/2022), Disp: , Rfl:    Ipratropium-Albuterol (COMBIVENT) 20-100 MCG/ACT AERS respimat, Inhale 1 puff into the lungs every 6 (six) hours.  (Patient not taking: Reported on 07/14/2022), Disp: 1 each, Rfl: 4   Melatonin 10 MG SUBL, Place 10 mg under the tongue as needed. (Patient not taking: Reported on 07/14/2022), Disp: , Rfl:    moxifloxacin (AVELOX) 400 MG tablet, Take 1 tablet (400 mg total) by mouth daily at 8 pm., Disp: 5 tablet, Rfl: 0   ondansetron (ZOFRAN) 8 MG tablet, Take 1 tablet (8 mg total) by mouth every 8 (eight) hours as needed for nausea or vomiting. Start on the third day after chemotherapy. (Patient not taking: Reported on 07/14/2022), Disp: 30 tablet, Rfl: 1   prochlorperazine (COMPAZINE) 10 MG tablet, Take 1 tablet (10 mg total) by mouth every 6 (six) hours as needed for nausea or vomiting. (Patient not taking: Reported on 07/14/2022), Disp: 30 tablet, Rfl: 1 No current facility-administered medications for this visit.  Facility-Administered Medications Ordered in Other Visits:    heparin lock flush 100 unit/mL, 500 Units, Intravenous, Once, Covington, Sarah M, PA-C   sodium chloride flush (NS) 0.9 % injection 10 mL, 10 mL, Intravenous, PRN, Covington, Sarah M, PA-C  Allergies: No Known Allergies  Past Medical History, Surgical history, Social history, and Family History were reviewed and updated.  Review of Systems: Review of Systems  Constitutional: Negative.   HENT:  Negative.    Eyes: Negative.   Respiratory: Negative.    Cardiovascular: Negative.   Gastrointestinal:  Positive for abdominal pain and nausea.  Endocrine: Negative.   Genitourinary: Negative.    Musculoskeletal: Negative.   Skin: Negative.   Neurological: Negative.   Hematological: Negative.   Psychiatric/Behavioral: Negative.      Physical Exam:  weight is 193 lb (87.5 kg). His temperature is 99.1 F (37.3 C). His blood pressure is 148/73 (abnormal) and his pulse is 90. His respiration is 18 and oxygen saturation is 9% (abnormal).   Wt Readings from Last 3 Encounters:  07/14/22 193 lb (87.5 kg)  06/27/22 190 lb (86.2 kg)   06/16/22 195 lb (88.5 kg)    Physical Exam Vitals reviewed.  HENT:     Head: Normocephalic and atraumatic.  Eyes:     Pupils: Pupils are equal, round, and reactive to light.  Cardiovascular:     Rate and Rhythm: Normal rate and regular rhythm.     Heart sounds: Normal heart sounds.  Pulmonary:     Effort: Pulmonary effort is normal.     Breath sounds: Normal breath sounds.  Abdominal:     General: Bowel sounds are normal.     Palpations: Abdomen is soft.  Musculoskeletal:        General: No tenderness or deformity. Normal range of motion.  Cervical back: Normal range of motion.  Lymphadenopathy:     Cervical: No cervical adenopathy.  Skin:    General: Skin is warm and dry.     Findings: No erythema or rash.  Neurological:     Mental Status: He is alert and oriented to person, place, and time.  Psychiatric:        Behavior: Behavior normal.        Thought Content: Thought content normal.        Judgment: Judgment normal.    Lab Results  Component Value Date   WBC 3.1 (L) 07/14/2022   HGB 9.1 (L) 07/14/2022   HCT 26.9 (L) 07/14/2022   MCV 94.4 07/14/2022   PLT 83 (L) 07/14/2022     Chemistry      Component Value Date/Time   NA 141 07/14/2022 1015   NA 138 12/06/2019 1501   NA 142 04/25/2016 0844   NA 140 10/19/2015 0903   K 3.9 07/14/2022 1015   K 4.1 04/25/2016 0844   K 4.1 10/19/2015 0903   CL 106 07/14/2022 1015   CL 102 04/25/2016 0844   CO2 27 07/14/2022 1015   CO2 29 04/25/2016 0844   CO2 25 10/19/2015 0903   BUN 20 07/14/2022 1015   BUN 20 12/06/2019 1501   BUN 14 04/25/2016 0844   BUN 21.6 10/19/2015 0903   CREATININE 1.48 (H) 07/14/2022 1015   CREATININE 1.1 04/25/2016 0844   CREATININE 1.3 10/19/2015 0903      Component Value Date/Time   CALCIUM 9.1 07/14/2022 1015   CALCIUM 9.6 04/25/2016 0844   CALCIUM 9.4 10/19/2015 0903   ALKPHOS 54 07/14/2022 1015   ALKPHOS 59 04/25/2016 0844   ALKPHOS 68 10/19/2015 0903   AST 20 07/14/2022  1015   AST 21 10/19/2015 0903   ALT 15 07/14/2022 1015   ALT 27 04/25/2016 0844   ALT 19 10/19/2015 0903   BILITOT 0.4 07/14/2022 1015   BILITOT 0.54 10/19/2015 0903      Impression and Plan: [name] is a 72 year old white male.  We treated him 10 years ago.  He had a marginal zone lymphoma.  He went into clinical remission.  When he initially presented, he had splenomegaly.  When he relapsed, he again had splenomegaly.  We subsequently treated him with Imbruvica and venetoclax.  He completed his treatments back in April.  He has completed 6 cycles of Rituxan/Bendamustine.  He had a little bit of a tough time with his protocol.  However, the bone marrow looks fantastic.  We will just watch him.  We will see how his monoclonal studies look.  We will see about using ESA.  Hopefully, we can do the Aranesp.  I do think this will help.  We will try to get him started on ESA next week.  Will have to follow him somewhat closely given that we will going to start the ESA.  Again, hopefully insurance will allow [place] to use Aranesp which be a lot more convenient for him.  I know this is quite complicated.  There is a lot going on.  His quality life clearly is dictated by his anemia right now.  If we get his anemia improved, a lot of his symptoms will improve.  Will plan to get him back to see [place] in about a month.   [name], MD 4/25/202410:55 AM

## 2022-07-15 LAB — KAPPA/LAMBDA LIGHT CHAINS
Kappa free light chain: 66.4 mg/L — ABNORMAL HIGH (ref 3.3–19.4)
Kappa, lambda light chain ratio: 8.41 — ABNORMAL HIGH (ref 0.26–1.65)
Lambda free light chains: 7.9 mg/L (ref 5.7–26.3)

## 2022-07-15 LAB — IGG, IGA, IGM
IgA: 76 mg/dL (ref 61–437)
IgG (Immunoglobin G), Serum: 421 mg/dL — ABNORMAL LOW (ref 603–1613)
IgM (Immunoglobulin M), Srm: 370 mg/dL — ABNORMAL HIGH (ref 20–172)

## 2022-07-18 ENCOUNTER — Encounter: Payer: Self-pay | Admitting: *Deleted

## 2022-07-21 LAB — PROTEIN ELECTROPHORESIS, SERUM, WITH REFLEX
A/G Ratio: 1.4 (ref 0.7–1.7)
Albumin ELP: 3.4 g/dL (ref 2.9–4.4)
Alpha-1-Globulin: 0.3 g/dL (ref 0.0–0.4)
Alpha-2-Globulin: 0.8 g/dL (ref 0.4–1.0)
Beta Globulin: 0.9 g/dL (ref 0.7–1.3)
Gamma Globulin: 0.6 g/dL (ref 0.4–1.8)
Globulin, Total: 2.5 g/dL (ref 2.2–3.9)
M-Spike, %: 0.3 g/dL — ABNORMAL HIGH
SPEP Interpretation: 0
Total Protein ELP: 5.9 g/dL — ABNORMAL LOW (ref 6.0–8.5)

## 2022-07-21 LAB — IMMUNOFIXATION REFLEX, SERUM
IgA: 78 mg/dL (ref 61–437)
IgG (Immunoglobin G), Serum: 434 mg/dL — ABNORMAL LOW (ref 603–1613)
IgM (Immunoglobulin M), Srm: 416 mg/dL — ABNORMAL HIGH (ref 20–172)

## 2022-07-27 ENCOUNTER — Telehealth: Payer: Self-pay | Admitting: *Deleted

## 2022-07-27 ENCOUNTER — Other Ambulatory Visit: Payer: Self-pay | Admitting: Hematology & Oncology

## 2022-07-27 NOTE — Telephone Encounter (Signed)
Called pt regarding mychart msg.per MD note on 4/25 - Aranesp q 3weeks for hgb <11.  Checked with Lurline Hare in authorizations. Pts insurance does not require authorization.  Message to scheduling for injection appt 5/9 10am per pt request.

## 2022-07-28 ENCOUNTER — Inpatient Hospital Stay: Payer: PPO | Attending: Hematology & Oncology

## 2022-07-28 VITALS — BP 145/68 | HR 67 | Temp 97.8°F | Resp 18

## 2022-07-28 DIAGNOSIS — C8583 Other specified types of non-Hodgkin lymphoma, intra-abdominal lymph nodes: Secondary | ICD-10-CM

## 2022-07-28 DIAGNOSIS — N1831 Chronic kidney disease, stage 3a: Secondary | ICD-10-CM | POA: Diagnosis not present

## 2022-07-28 DIAGNOSIS — D631 Anemia in chronic kidney disease: Secondary | ICD-10-CM | POA: Insufficient documentation

## 2022-07-28 MED ORDER — DARBEPOETIN ALFA 300 MCG/0.6ML IJ SOSY
300.0000 ug | PREFILLED_SYRINGE | Freq: Once | INTRAMUSCULAR | Status: AC
  Administered 2022-07-28: 300 ug via SUBCUTANEOUS
  Filled 2022-07-28: qty 0.6

## 2022-07-28 NOTE — Patient Instructions (Signed)

## 2022-08-17 ENCOUNTER — Inpatient Hospital Stay (HOSPITAL_BASED_OUTPATIENT_CLINIC_OR_DEPARTMENT_OTHER): Payer: PPO | Admitting: Hematology & Oncology

## 2022-08-17 ENCOUNTER — Inpatient Hospital Stay: Payer: PPO

## 2022-08-17 ENCOUNTER — Inpatient Hospital Stay: Payer: PPO | Admitting: Hematology & Oncology

## 2022-08-17 ENCOUNTER — Encounter: Payer: Self-pay | Admitting: Hematology & Oncology

## 2022-08-17 ENCOUNTER — Other Ambulatory Visit: Payer: Self-pay

## 2022-08-17 VITALS — BP 133/71 | HR 80 | Temp 98.2°F | Resp 18 | Ht 71.0 in | Wt 198.1 lb

## 2022-08-17 DIAGNOSIS — C8583 Other specified types of non-Hodgkin lymphoma, intra-abdominal lymph nodes: Secondary | ICD-10-CM

## 2022-08-17 DIAGNOSIS — N1831 Chronic kidney disease, stage 3a: Secondary | ICD-10-CM

## 2022-08-17 DIAGNOSIS — D631 Anemia in chronic kidney disease: Secondary | ICD-10-CM

## 2022-08-17 DIAGNOSIS — Z95828 Presence of other vascular implants and grafts: Secondary | ICD-10-CM

## 2022-08-17 LAB — CMP (CANCER CENTER ONLY)
ALT: 12 U/L (ref 0–44)
AST: 17 U/L (ref 15–41)
Albumin: 4.3 g/dL (ref 3.5–5.0)
Alkaline Phosphatase: 53 U/L (ref 38–126)
Anion gap: 9 (ref 5–15)
BUN: 23 mg/dL (ref 8–23)
CO2: 29 mmol/L (ref 22–32)
Calcium: 9.7 mg/dL (ref 8.9–10.3)
Chloride: 103 mmol/L (ref 98–111)
Creatinine: 1.4 mg/dL — ABNORMAL HIGH (ref 0.61–1.24)
GFR, Estimated: 54 mL/min — ABNORMAL LOW (ref >60)
Glucose, Bld: 93 mg/dL (ref 70–99)
Potassium: 4.4 mmol/L (ref 3.5–5.1)
Sodium: 141 mmol/L (ref 135–145)
Total Bilirubin: 0.5 mg/dL (ref 0.3–1.2)
Total Protein: 6 g/dL — ABNORMAL LOW (ref 6.5–8.1)

## 2022-08-17 LAB — CBC WITH DIFFERENTIAL (CANCER CENTER ONLY)
Abs Immature Granulocytes: 0.05 10*3/uL (ref 0.00–0.07)
Basophils Absolute: 0 10*3/uL (ref 0.0–0.1)
Basophils Relative: 1 %
Eosinophils Absolute: 0.1 10*3/uL (ref 0.0–0.5)
Eosinophils Relative: 2 %
HCT: 32.5 % — ABNORMAL LOW (ref 39.0–52.0)
Hemoglobin: 10.7 g/dL — ABNORMAL LOW (ref 13.0–17.0)
Immature Granulocytes: 2 %
Lymphocytes Relative: 27 %
Lymphs Abs: 0.8 10*3/uL (ref 0.7–4.0)
MCH: 31.4 pg (ref 26.0–34.0)
MCHC: 32.9 g/dL (ref 30.0–36.0)
MCV: 95.3 fL (ref 80.0–100.0)
Monocytes Absolute: 0.8 10*3/uL (ref 0.1–1.0)
Monocytes Relative: 25 %
Neutro Abs: 1.3 10*3/uL — ABNORMAL LOW (ref 1.7–7.7)
Neutrophils Relative %: 43 %
Platelet Count: 136 10*3/uL — ABNORMAL LOW (ref 150–400)
RBC: 3.41 MIL/uL — ABNORMAL LOW (ref 4.22–5.81)
RDW: 13.2 % (ref 11.5–15.5)
WBC Count: 3 10*3/uL — ABNORMAL LOW (ref 4.0–10.5)
nRBC: 0 % (ref 0.0–0.2)

## 2022-08-17 LAB — RETICULOCYTES
Immature Retic Fract: 15.7 % (ref 2.3–15.9)
RBC.: 3.36 MIL/uL — ABNORMAL LOW (ref 4.22–5.81)
Retic Count, Absolute: 66.2 10*3/uL (ref 19.0–186.0)
Retic Ct Pct: 2 % (ref 0.4–3.1)

## 2022-08-17 LAB — LACTATE DEHYDROGENASE: LDH: 183 U/L (ref 98–192)

## 2022-08-17 LAB — IRON AND IRON BINDING CAPACITY (CC-WL,HP ONLY)
Iron: 69 ug/dL (ref 45–182)
Saturation Ratios: 19 % (ref 17.9–39.5)
TIBC: 370 ug/dL (ref 250–450)
UIBC: 301 ug/dL (ref 117–376)

## 2022-08-17 LAB — FERRITIN: Ferritin: 33 ng/mL (ref 24–336)

## 2022-08-17 MED ORDER — SODIUM CHLORIDE 0.9% FLUSH
10.0000 mL | Freq: Once | INTRAVENOUS | Status: AC
  Administered 2022-08-17: 10 mL via INTRAVENOUS

## 2022-08-17 MED ORDER — DARBEPOETIN ALFA 300 MCG/0.6ML IJ SOSY
300.0000 ug | PREFILLED_SYRINGE | Freq: Once | INTRAMUSCULAR | Status: AC
  Administered 2022-08-17: 300 ug via SUBCUTANEOUS
  Filled 2022-08-17: qty 0.6

## 2022-08-17 MED ORDER — HEPARIN SOD (PORK) LOCK FLUSH 100 UNIT/ML IV SOLN
500.0000 [IU] | Freq: Once | INTRAVENOUS | Status: AC
  Administered 2022-08-17: 500 [IU] via INTRAVENOUS

## 2022-08-17 NOTE — Progress Notes (Signed)
Hematology and Oncology Follow Up Visit  [name] [name] [id number] 1951/03/04 72 y.o. 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  We started him on Aranesp.  I think he has responded.  His hemoglobin today is 10.7.  I think this is just an indicator that he has low erythropoietin level.  He is feeling well otherwise.  He is not coughing.  I am glad that his lungs are doing okay.  He has had no problems with nausea or vomiting.  There is no change in bowel or bladder habits.  He has had no rashes.  He says little bit of leg swelling.  His monoclonal studies showed M spike of 0.3 g/dL.  The IgM is 390 mg/dL.  The Kappa light chain is 6.6 mg/dL.  All this is holding pretty stable.  He has had no fever.  He and his wife were up in [place] for [place] Day weekend.  Next month, will be their 50th wedding anniversary.  I am absolutely impressed by this.  They both look so young.  Currently, I would have said that his performance status by ECOG 1.   Medications:  Current Outpatient Medications:    famciclovir (FAMVIR) 250 MG tablet, Take 1 tablet (250 mg total) by mouth daily., Disp: 30 tablet, Rfl: 12   furosemide (LASIX) 40 MG tablet, Take 40 mg by mouth daily as needed., Disp: , Rfl:    hydrALAZINE (APRESOLINE) 25 MG tablet, Take 1 tablet (25 mg total) by mouth 3 (three) times daily., Disp: 90 tablet, Rfl: 5   Ipratropium-Albuterol (COMBIVENT) 20-100 MCG/ACT AERS respimat, Inhale 1  puff into the lungs every 6 (six) hours., Disp: 1 each, Rfl: 4   levothyroxine (SYNTHROID) 137 MCG tablet, Take 1 tablet (137 mcg total) by mouth daily before breakfast., Disp: 90 tablet, Rfl: 3   Melatonin 10 MG SUBL, Place 10 mg under the tongue as needed., Disp: , Rfl:    Multiple Vitamins-Minerals (CENTRUM SILVER 50+MEN PO), Take by mouth., Disp: , Rfl:    rosuvastatin (CRESTOR) 20 MG tablet, TAKE 1 TABLET BY MOUTH EVERY DAY, Disp: 90 tablet, Rfl: 1   venlafaxine XR (EFFEXOR-XR) 37.5 MG 24 hr capsule, TAKE 1 CAPSULE BY MOUTH DAILY WITH BREAKFAST., Disp: 90 capsule, Rfl: 2  Allergies: No Known Allergies  Past Medical History, Surgical history, Social history, and Family History were reviewed and updated.  Review of Systems: Review of Systems  Constitutional: Negative.   HENT:  Negative.    Eyes: Negative.   Respiratory: Negative.    Cardiovascular: Negative.   Gastrointestinal:  Positive for abdominal pain and nausea.  Endocrine: Negative.   Genitourinary: Negative.    Musculoskeletal: Negative.   Skin: Negative.   Neurological: Negative.   Hematological: Negative.   Psychiatric/Behavioral: Negative.      Physical Exam:  height is 5\' 11"  (1.803 m) and weight is 198 lb 1.9 oz (89.9 kg). His oral temperature is 98.2 F (36.8 C). His blood  pressure is 133/71 and his pulse is 80. His respiration is 18 and oxygen saturation is 100%.   Wt Readings from Last 3 Encounters:  08/17/22 198 lb 1.9 oz (89.9 kg)  07/14/22 193 lb (87.5 kg)  06/27/22 190 lb (86.2 kg)    Physical Exam Vitals reviewed.  HENT:     Head: Normocephalic and atraumatic.  Eyes:     Pupils: Pupils are equal, round, and reactive to light.  Cardiovascular:     Rate and Rhythm: Normal rate and regular rhythm.     Heart sounds: Normal heart sounds.  Pulmonary:     Effort: Pulmonary effort is normal.     Breath sounds: Normal breath sounds.  Abdominal:     General: Bowel sounds are normal.     Palpations:  Abdomen is soft.  Musculoskeletal:        General: No tenderness or deformity. Normal range of motion.     Cervical back: Normal range of motion.  Lymphadenopathy:     Cervical: No cervical adenopathy.  Skin:    General: Skin is warm and dry.     Findings: No erythema or rash.  Neurological:     Mental Status: He is alert and oriented to person, place, and time.  Psychiatric:        Behavior: Behavior normal.        Thought Content: Thought content normal.        Judgment: Judgment normal.    Lab Results  Component Value Date   WBC 3.0 (L) 08/17/2022   HGB 10.7 (L) 08/17/2022   HCT 32.5 (L) 08/17/2022   MCV 95.3 08/17/2022   PLT 136 (L) 08/17/2022     Chemistry      Component Value Date/Time   NA 141 08/17/2022 0939   NA 138 12/06/2019 1501   NA 142 04/25/2016 0844   NA 140 10/19/2015 0903   K 4.4 08/17/2022 0939   K 4.1 04/25/2016 0844   K 4.1 10/19/2015 0903   CL 103 08/17/2022 0939   CL 102 04/25/2016 0844   CO2 29 08/17/2022 0939   CO2 29 04/25/2016 0844   CO2 25 10/19/2015 0903   BUN 23 08/17/2022 0939   BUN 20 12/06/2019 1501   BUN 14 04/25/2016 0844   BUN 21.6 10/19/2015 0903   CREATININE 1.40 (H) 08/17/2022 0939   CREATININE 1.1 04/25/2016 0844   CREATININE 1.3 10/19/2015 0903      Component Value Date/Time   CALCIUM 9.7 08/17/2022 0939   CALCIUM 9.6 04/25/2016 0844   CALCIUM 9.4 10/19/2015 0903   ALKPHOS 53 08/17/2022 0939   ALKPHOS 59 04/25/2016 0844   ALKPHOS 68 10/19/2015 0903   AST 17 08/17/2022 0939   AST 21 10/19/2015 0903   ALT 12 08/17/2022 0939   ALT 27 04/25/2016 0844   ALT 19 10/19/2015 0903   BILITOT 0.5 08/17/2022 0939   BILITOT 0.54 10/19/2015 0903      Impression and Plan: [name] [name] is a 72 year old white male.  We treated him 10 years ago.  He had a marginal zone lymphoma.  He went into clinical remission.  When he initially presented, he had splenomegaly.  When he relapsed, he again had splenomegaly.  We subsequently  treated him with Imbruvica and venetoclax.  He completed his treatments back in April.  He has completed 6 cycles of Rituxan/Bendamustine.  He had a little bit of a tough time with his protocol.  However, the bone marrow looks fantastic.  We will just watch him.  We will see how his monoclonal studies look.  We will go ahead and give him Aranesp today.  I would like to think that we can get his hemoglobin above 11.  This would certainly help him out.  I think will help some of the leg swelling out.  I would like to get him back to see [place] in another month.  I think we have to stay on top of his blood counts while we are doing the Aranesp.  [name] Macho, MD 5/29/202410:49 AM

## 2022-08-17 NOTE — Patient Instructions (Signed)

## 2022-08-17 NOTE — Patient Instructions (Signed)

## 2022-08-18 ENCOUNTER — Encounter: Payer: Self-pay | Admitting: Hematology & Oncology

## 2022-08-18 LAB — KAPPA/LAMBDA LIGHT CHAINS
Kappa free light chain: 46.7 mg/L — ABNORMAL HIGH (ref 3.3–19.4)
Kappa, lambda light chain ratio: 5.02 — ABNORMAL HIGH (ref 0.26–1.65)
Lambda free light chains: 9.3 mg/L (ref 5.7–26.3)

## 2022-08-18 MED ORDER — FUSION PLUS PO CAPS
1.0000 | ORAL_CAPSULE | Freq: Every day | ORAL | 2 refills | Status: AC
Start: 2022-08-18 — End: 2022-09-17

## 2022-08-18 MED ORDER — FUSION PLUS PO CAPS
1.0000 | ORAL_CAPSULE | Freq: Every day | ORAL | 2 refills | Status: DC
Start: 2022-08-18 — End: 2022-08-18

## 2022-08-18 NOTE — Progress Notes (Deleted)
Prescription for Fusion Plus ordered and sent.

## 2022-08-18 NOTE — Progress Notes (Signed)
Error

## 2022-08-19 ENCOUNTER — Encounter: Payer: Self-pay | Admitting: Hematology & Oncology

## 2022-08-19 DIAGNOSIS — N1831 Chronic kidney disease, stage 3a: Secondary | ICD-10-CM | POA: Diagnosis not present

## 2022-08-19 LAB — IGG, IGA, IGM
IgA: 73 mg/dL (ref 61–437)
IgG (Immunoglobin G), Serum: 396 mg/dL — ABNORMAL LOW (ref 603–1613)
IgM (Immunoglobulin M), Srm: 330 mg/dL — ABNORMAL HIGH (ref 20–172)

## 2022-08-22 DIAGNOSIS — I129 Hypertensive chronic kidney disease with stage 1 through stage 4 chronic kidney disease, or unspecified chronic kidney disease: Secondary | ICD-10-CM | POA: Diagnosis not present

## 2022-08-22 DIAGNOSIS — C8580 Other specified types of non-Hodgkin lymphoma, unspecified site: Secondary | ICD-10-CM | POA: Diagnosis not present

## 2022-08-22 DIAGNOSIS — E875 Hyperkalemia: Secondary | ICD-10-CM | POA: Diagnosis not present

## 2022-08-22 DIAGNOSIS — N1831 Chronic kidney disease, stage 3a: Secondary | ICD-10-CM | POA: Diagnosis not present

## 2022-08-22 DIAGNOSIS — R809 Proteinuria, unspecified: Secondary | ICD-10-CM | POA: Diagnosis not present

## 2022-08-24 ENCOUNTER — Telehealth: Payer: Self-pay

## 2022-08-24 LAB — IMMUNOFIXATION REFLEX, SERUM
IgA: 72 mg/dL (ref 61–437)
IgG (Immunoglobin G), Serum: 452 mg/dL — ABNORMAL LOW (ref 603–1613)
IgM (Immunoglobulin M), Srm: 370 mg/dL — ABNORMAL HIGH (ref 20–172)

## 2022-08-24 LAB — PROTEIN ELECTROPHORESIS, SERUM, WITH REFLEX
A/G Ratio: 1.3 (ref 0.7–1.7)
Albumin ELP: 3.3 g/dL (ref 2.9–4.4)
Alpha-1-Globulin: 0.3 g/dL (ref 0.0–0.4)
Alpha-2-Globulin: 0.9 g/dL (ref 0.4–1.0)
Beta Globulin: 0.8 g/dL (ref 0.7–1.3)
Gamma Globulin: 0.6 g/dL (ref 0.4–1.8)
Globulin, Total: 2.6 g/dL (ref 2.2–3.9)
M-Spike, %: 0.2 g/dL — ABNORMAL HIGH
SPEP Interpretation: 0
Total Protein ELP: 5.9 g/dL — ABNORMAL LOW (ref 6.0–8.5)

## 2022-08-24 NOTE — Telephone Encounter (Signed)
-----   Message from [name], MD sent at 08/24/2022  3:08 PM EDT ----- Please call let him know that the protein level keeps going down.  It is now 0.2.  This is fantastic.  Thanks.  [name]

## 2022-09-12 DIAGNOSIS — D3131 Benign neoplasm of right choroid: Secondary | ICD-10-CM | POA: Diagnosis not present

## 2022-09-16 ENCOUNTER — Inpatient Hospital Stay: Payer: PPO

## 2022-09-16 ENCOUNTER — Encounter: Payer: Self-pay | Admitting: Hematology & Oncology

## 2022-09-16 ENCOUNTER — Inpatient Hospital Stay: Payer: PPO | Attending: Hematology & Oncology

## 2022-09-16 ENCOUNTER — Inpatient Hospital Stay: Payer: PPO | Admitting: Hematology & Oncology

## 2022-09-16 VITALS — BP 117/67 | HR 90 | Temp 98.6°F | Resp 18 | Wt 197.0 lb

## 2022-09-16 DIAGNOSIS — C8583 Other specified types of non-Hodgkin lymphoma, intra-abdominal lymph nodes: Secondary | ICD-10-CM

## 2022-09-16 DIAGNOSIS — Z8572 Personal history of non-Hodgkin lymphomas: Secondary | ICD-10-CM | POA: Insufficient documentation

## 2022-09-16 DIAGNOSIS — N1831 Chronic kidney disease, stage 3a: Secondary | ICD-10-CM

## 2022-09-16 DIAGNOSIS — D631 Anemia in chronic kidney disease: Secondary | ICD-10-CM | POA: Diagnosis not present

## 2022-09-16 DIAGNOSIS — Z79899 Other long term (current) drug therapy: Secondary | ICD-10-CM | POA: Insufficient documentation

## 2022-09-16 DIAGNOSIS — Z79624 Long term (current) use of inhibitors of nucleotide synthesis: Secondary | ICD-10-CM | POA: Insufficient documentation

## 2022-09-16 DIAGNOSIS — E038 Other specified hypothyroidism: Secondary | ICD-10-CM

## 2022-09-16 DIAGNOSIS — Z95828 Presence of other vascular implants and grafts: Secondary | ICD-10-CM

## 2022-09-16 LAB — CBC WITH DIFFERENTIAL (CANCER CENTER ONLY)
Abs Immature Granulocytes: 0.18 10*3/uL — ABNORMAL HIGH (ref 0.00–0.07)
Basophils Absolute: 0 10*3/uL (ref 0.0–0.1)
Basophils Relative: 1 %
Eosinophils Absolute: 0 10*3/uL (ref 0.0–0.5)
Eosinophils Relative: 1 %
HCT: 35 % — ABNORMAL LOW (ref 39.0–52.0)
Hemoglobin: 11.6 g/dL — ABNORMAL LOW (ref 13.0–17.0)
Immature Granulocytes: 6 %
Lymphocytes Relative: 31 %
Lymphs Abs: 0.9 10*3/uL (ref 0.7–4.0)
MCH: 30.9 pg (ref 26.0–34.0)
MCHC: 33.1 g/dL (ref 30.0–36.0)
MCV: 93.3 fL (ref 80.0–100.0)
Monocytes Absolute: 0.6 10*3/uL (ref 0.1–1.0)
Monocytes Relative: 21 %
Neutro Abs: 1.2 10*3/uL — ABNORMAL LOW (ref 1.7–7.7)
Neutrophils Relative %: 40 %
Platelet Count: 148 10*3/uL — ABNORMAL LOW (ref 150–400)
RBC: 3.75 MIL/uL — ABNORMAL LOW (ref 4.22–5.81)
RDW: 12.6 % (ref 11.5–15.5)
WBC Count: 3 10*3/uL — ABNORMAL LOW (ref 4.0–10.5)
nRBC: 0 % (ref 0.0–0.2)

## 2022-09-16 LAB — IRON AND IRON BINDING CAPACITY (CC-WL,HP ONLY)
Iron: 63 ug/dL (ref 45–182)
Saturation Ratios: 18 % (ref 17.9–39.5)
TIBC: 344 ug/dL (ref 250–450)
UIBC: 281 ug/dL (ref 117–376)

## 2022-09-16 LAB — CMP (CANCER CENTER ONLY)
ALT: 12 U/L (ref 0–44)
AST: 18 U/L (ref 15–41)
Albumin: 4.2 g/dL (ref 3.5–5.0)
Alkaline Phosphatase: 64 U/L (ref 38–126)
Anion gap: 6 (ref 5–15)
BUN: 25 mg/dL — ABNORMAL HIGH (ref 8–23)
CO2: 30 mmol/L (ref 22–32)
Calcium: 9.6 mg/dL (ref 8.9–10.3)
Chloride: 103 mmol/L (ref 98–111)
Creatinine: 1.55 mg/dL — ABNORMAL HIGH (ref 0.61–1.24)
GFR, Estimated: 48 mL/min — ABNORMAL LOW (ref >60)
Glucose, Bld: 100 mg/dL — ABNORMAL HIGH (ref 70–99)
Potassium: 3.8 mmol/L (ref 3.5–5.1)
Sodium: 139 mmol/L (ref 135–145)
Total Bilirubin: 0.3 mg/dL (ref 0.3–1.2)
Total Protein: 6.6 g/dL (ref 6.5–8.1)

## 2022-09-16 LAB — RETICULOCYTES
Immature Retic Fract: 13.6 % (ref 2.3–15.9)
RBC.: 3.72 MIL/uL — ABNORMAL LOW (ref 4.22–5.81)
Retic Count, Absolute: 37.2 10*3/uL (ref 19.0–186.0)
Retic Ct Pct: 1 % (ref 0.4–3.1)

## 2022-09-16 LAB — FERRITIN: Ferritin: 104 ng/mL (ref 24–336)

## 2022-09-16 MED ORDER — HEPARIN SOD (PORK) LOCK FLUSH 100 UNIT/ML IV SOLN
500.0000 [IU] | Freq: Once | INTRAVENOUS | Status: AC
  Administered 2022-09-16: 500 [IU] via INTRAVENOUS

## 2022-09-16 MED ORDER — SODIUM CHLORIDE 0.9% FLUSH
10.0000 mL | Freq: Once | INTRAVENOUS | Status: AC
  Administered 2022-09-16: 10 mL via INTRAVENOUS

## 2022-09-16 NOTE — Patient Instructions (Signed)

## 2022-09-16 NOTE — Progress Notes (Signed)
Hematology and Oncology Follow Up Visit  [name] [name] [id number] 1951-02-23 72 y.o. 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  He is doing pretty well.  He really has had no specific complaints.  He does say that he on occasion, does not feel all that well.  He has little bit of nausea.  He has responded, as expected, to the Aranesp.  His hemoglobin is up to 11.6 today.  He does not need to have any Aranesp.  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  His last monoclonal spike was 0.2 mg/dL.  His IgM level was 350 mg/dL.  The Kappa light chain was 4.7 mg/dL.  He has had no rashes.  He says little bit leg swelling.  He has had no problems with bowels or bladder.  There has been no obvious bleeding.  He has had no cough.  Overall, I would say that his performance status is probably ECOG 1. \  Medications:  Current Outpatient Medications:    famciclovir (FAMVIR) 250 MG tablet, Take 1 tablet (250 mg total) by mouth daily., Disp: 30 tablet, Rfl: 12   furosemide (LASIX) 40 MG tablet, Take 40 mg by mouth daily as needed., Disp: , Rfl:    hydrALAZINE (APRESOLINE) 25 MG tablet, Take 1 tablet (25 mg total) by mouth 3 (three) times daily., Disp: 90 tablet, Rfl: 5   Ipratropium-Albuterol (COMBIVENT) 20-100 MCG/ACT AERS respimat, Inhale 1 puff into the lungs every 6 (six) hours., Disp: 1 each, Rfl: 4   Iron-FA-B Cmp-C-Biot-Probiotic (FUSION PLUS) CAPS, Take 1 capsule by mouth  daily at 6 (six) AM., Disp: 30 capsule, Rfl: 2   levothyroxine (SYNTHROID) 137 MCG tablet, Take 1 tablet (137 mcg total) by mouth daily before breakfast., Disp: 90 tablet, Rfl: 3   Melatonin 10 MG SUBL, Place 10 mg under the tongue as needed., Disp: , Rfl:    Multiple Vitamins-Minerals (CENTRUM SILVER 50+MEN PO), Take by mouth., Disp: , Rfl:    ondansetron (ZOFRAN) 8 MG tablet, Take 8 mg by mouth every 8 (eight) hours as needed for nausea or vomiting (start 3rd day after chemotherapy)., Disp: , Rfl:    prochlorperazine (COMPAZINE) 10 MG tablet, Take 10 mg by mouth every 6 (six) hours as needed for nausea or vomiting., Disp: , Rfl:    rosuvastatin (CRESTOR) 20 MG tablet, TAKE 1 TABLET BY MOUTH EVERY DAY, Disp: 90 tablet, Rfl: 1   venlafaxine XR (EFFEXOR-XR) 37.5 MG 24 hr capsule, TAKE 1 CAPSULE BY MOUTH DAILY WITH BREAKFAST., Disp: 90 capsule, Rfl: 2  Allergies: No Known Allergies  Past Medical History, Surgical history, Social history, and Family History were reviewed and updated.  Review of Systems: Review of Systems  Constitutional: Negative.   HENT:  Negative.    Eyes: Negative.   Respiratory: Negative.    Cardiovascular: Negative.   Gastrointestinal:  Positive for abdominal pain and nausea.  Endocrine: Negative.   Genitourinary: Negative.    Musculoskeletal:  Negative.   Skin: Negative.   Neurological: Negative.   Hematological: Negative.   Psychiatric/Behavioral: Negative.      Physical Exam:  weight is 197 lb (89.4 kg). His oral temperature is 98.6 F (37 C). His blood pressure is 117/67 and his pulse is 90. His respiration is 18 and oxygen saturation is 97%.   Wt Readings from Last 3 Encounters:  09/16/22 197 lb (89.4 kg)  08/17/22 198 lb 1.9 oz (89.9 kg)  07/14/22 193 lb (87.5 kg)    Physical Exam Vitals reviewed.  HENT:     Head: Normocephalic and atraumatic.  Eyes:     Pupils: Pupils are equal, round, and reactive to light.  Cardiovascular:     Rate and Rhythm:  Normal rate and regular rhythm.     Heart sounds: Normal heart sounds.  Pulmonary:     Effort: Pulmonary effort is normal.     Breath sounds: Normal breath sounds.  Abdominal:     General: Bowel sounds are normal.     Palpations: Abdomen is soft.  Musculoskeletal:        General: No tenderness or deformity. Normal range of motion.     Cervical back: Normal range of motion.  Lymphadenopathy:     Cervical: No cervical adenopathy.  Skin:    General: Skin is warm and dry.     Findings: No erythema or rash.  Neurological:     Mental Status: He is alert and oriented to person, place, and time.  Psychiatric:        Behavior: Behavior normal.        Thought Content: Thought content normal.        Judgment: Judgment normal.    Lab Results  Component Value Date   WBC 3.0 (L) 09/16/2022   HGB 11.6 (L) 09/16/2022   HCT 35.0 (L) 09/16/2022   MCV 93.3 09/16/2022   PLT 148 (L) 09/16/2022     Chemistry      Component Value Date/Time   NA 141 08/17/2022 0939   NA 138 12/06/2019 1501   NA 142 04/25/2016 0844   NA 140 10/19/2015 0903   K 4.4 08/17/2022 0939   K 4.1 04/25/2016 0844   K 4.1 10/19/2015 0903   CL 103 08/17/2022 0939   CL 102 04/25/2016 0844   CO2 29 08/17/2022 0939   CO2 29 04/25/2016 0844   CO2 25 10/19/2015 0903   BUN 23 08/17/2022 0939   BUN 20 12/06/2019 1501   BUN 14 04/25/2016 0844   BUN 21.6 10/19/2015 0903   CREATININE 1.40 (H) 08/17/2022 0939   CREATININE 1.1 04/25/2016 0844   CREATININE 1.3 10/19/2015 0903      Component Value Date/Time   CALCIUM 9.7 08/17/2022 0939   CALCIUM 9.6 04/25/2016 0844   CALCIUM 9.4 10/19/2015 0903   ALKPHOS 53 08/17/2022 0939   ALKPHOS 59 04/25/2016 0844   ALKPHOS 68 10/19/2015 0903   AST 17 08/17/2022 0939   AST 21 10/19/2015 0903   ALT 12 08/17/2022 0939   ALT 27 04/25/2016 0844   ALT 19 10/19/2015 0903   BILITOT 0.5 08/17/2022 0939   BILITOT 0.54 10/19/2015 0903      Impression and Plan: [name] [name] is a  71 year old white male.  We treated him 10 years ago.  He had a marginal zone lymphoma.  He went into clinical remission.  When he initially presented, he had splenomegaly.  When he relapsed, he again had splenomegaly.  We subsequently treated  him with Imbruvica and venetoclax.  He completed his treatments back in April.  He has completed 6 cycles of Rituxan/Bendamustine.  He had a little bit of a tough time with his protocol.  However, the bone marrow looks fantastic.  We will just watch him.  We will see how his monoclonal studies look.  Again, he does not need any Aranesp today.  I am very happy about this.  Hopefully, these episodes of not feeling well will get less and less.  At this point, I would like to plan to get him back to see [place] in another 6 weeks.  Will have him come back in 3 weeks for lab work and possible Aranesp.   [name] Macho, MD 6/28/202412:14 PM

## 2022-09-16 NOTE — Progress Notes (Signed)
No injection -parameters not met

## 2022-09-17 ENCOUNTER — Other Ambulatory Visit: Payer: Self-pay | Admitting: Internal Medicine

## 2022-09-17 ENCOUNTER — Other Ambulatory Visit: Payer: Self-pay | Admitting: Hematology & Oncology

## 2022-09-18 ENCOUNTER — Encounter: Payer: Self-pay | Admitting: Hematology & Oncology

## 2022-09-18 LAB — IGG, IGA, IGM
IgA: 61 mg/dL (ref 61–437)
IgG (Immunoglobin G), Serum: 349 mg/dL — ABNORMAL LOW (ref 603–1613)
IgM (Immunoglobulin M), Srm: 273 mg/dL — ABNORMAL HIGH (ref 20–172)

## 2022-09-19 LAB — KAPPA/LAMBDA LIGHT CHAINS
Kappa free light chain: 55 mg/L — ABNORMAL HIGH (ref 3.3–19.4)
Kappa, lambda light chain ratio: 4.95 — ABNORMAL HIGH (ref 0.26–1.65)
Lambda free light chains: 11.1 mg/L (ref 5.7–26.3)

## 2022-09-26 LAB — PROTEIN ELECTROPHORESIS, SERUM, WITH REFLEX
A/G Ratio: 1.3 (ref 0.7–1.7)
Albumin ELP: 3.5 g/dL (ref 2.9–4.4)
Alpha-1-Globulin: 0.3 g/dL (ref 0.0–0.4)
Alpha-2-Globulin: 1 g/dL (ref 0.4–1.0)
Beta Globulin: 0.8 g/dL (ref 0.7–1.3)
Gamma Globulin: 0.5 g/dL (ref 0.4–1.8)
Globulin, Total: 2.6 g/dL (ref 2.2–3.9)
M-Spike, %: 0.3 g/dL — ABNORMAL HIGH
SPEP Interpretation: 0
Total Protein ELP: 6.1 g/dL (ref 6.0–8.5)

## 2022-09-26 LAB — IMMUNOFIXATION REFLEX, SERUM
IgA: 61 mg/dL (ref 61–437)
IgG (Immunoglobin G), Serum: 370 mg/dL — ABNORMAL LOW (ref 603–1613)
IgM (Immunoglobulin M), Srm: 292 mg/dL — ABNORMAL HIGH (ref 20–172)

## 2022-10-06 ENCOUNTER — Other Ambulatory Visit: Payer: Self-pay

## 2022-10-06 DIAGNOSIS — N1831 Chronic kidney disease, stage 3a: Secondary | ICD-10-CM

## 2022-10-07 ENCOUNTER — Inpatient Hospital Stay: Payer: PPO | Attending: Hematology & Oncology

## 2022-10-07 ENCOUNTER — Inpatient Hospital Stay: Payer: PPO

## 2022-10-07 VITALS — BP 112/68 | HR 90 | Temp 98.3°F | Resp 20

## 2022-10-07 DIAGNOSIS — C8583 Other specified types of non-Hodgkin lymphoma, intra-abdominal lymph nodes: Secondary | ICD-10-CM | POA: Diagnosis not present

## 2022-10-07 DIAGNOSIS — C439 Malignant melanoma of skin, unspecified: Secondary | ICD-10-CM

## 2022-10-07 DIAGNOSIS — N1831 Chronic kidney disease, stage 3a: Secondary | ICD-10-CM

## 2022-10-07 DIAGNOSIS — Z9221 Personal history of antineoplastic chemotherapy: Secondary | ICD-10-CM | POA: Diagnosis not present

## 2022-10-07 LAB — CMP (CANCER CENTER ONLY)
ALT: 15 U/L (ref 0–44)
AST: 20 U/L (ref 15–41)
Albumin: 4.5 g/dL (ref 3.5–5.0)
Alkaline Phosphatase: 60 U/L (ref 38–126)
Anion gap: 7 (ref 5–15)
BUN: 25 mg/dL — ABNORMAL HIGH (ref 8–23)
CO2: 28 mmol/L (ref 22–32)
Calcium: 9.6 mg/dL (ref 8.9–10.3)
Chloride: 103 mmol/L (ref 98–111)
Creatinine: 1.51 mg/dL — ABNORMAL HIGH (ref 0.61–1.24)
GFR, Estimated: 49 mL/min — ABNORMAL LOW (ref >60)
Glucose, Bld: 128 mg/dL — ABNORMAL HIGH (ref 70–99)
Potassium: 4 mmol/L (ref 3.5–5.1)
Sodium: 138 mmol/L (ref 135–145)
Total Bilirubin: 0.4 mg/dL (ref 0.3–1.2)
Total Protein: 6.8 g/dL (ref 6.5–8.1)

## 2022-10-07 LAB — CBC WITH DIFFERENTIAL (CANCER CENTER ONLY)
Abs Immature Granulocytes: 0.13 10*3/uL — ABNORMAL HIGH (ref 0.00–0.07)
Basophils Absolute: 0 10*3/uL (ref 0.0–0.1)
Basophils Relative: 1 %
Eosinophils Absolute: 0 10*3/uL (ref 0.0–0.5)
Eosinophils Relative: 1 %
HCT: 35.2 % — ABNORMAL LOW (ref 39.0–52.0)
Hemoglobin: 11.6 g/dL — ABNORMAL LOW (ref 13.0–17.0)
Immature Granulocytes: 4 %
Lymphocytes Relative: 32 %
Lymphs Abs: 1 10*3/uL (ref 0.7–4.0)
MCH: 30.6 pg (ref 26.0–34.0)
MCHC: 33 g/dL (ref 30.0–36.0)
MCV: 92.9 fL (ref 80.0–100.0)
Monocytes Absolute: 0.6 10*3/uL (ref 0.1–1.0)
Monocytes Relative: 19 %
Neutro Abs: 1.3 10*3/uL — ABNORMAL LOW (ref 1.7–7.7)
Neutrophils Relative %: 43 %
Platelet Count: 140 10*3/uL — ABNORMAL LOW (ref 150–400)
RBC: 3.79 MIL/uL — ABNORMAL LOW (ref 4.22–5.81)
RDW: 13 % (ref 11.5–15.5)
WBC Count: 3 10*3/uL — ABNORMAL LOW (ref 4.0–10.5)
nRBC: 0 % (ref 0.0–0.2)

## 2022-10-07 MED ORDER — HEPARIN SOD (PORK) LOCK FLUSH 100 UNIT/ML IV SOLN
500.0000 [IU] | Freq: Once | INTRAVENOUS | Status: AC
  Administered 2022-10-07: 500 [IU] via INTRAVENOUS

## 2022-10-07 MED ORDER — SODIUM CHLORIDE 0.9% FLUSH
10.0000 mL | INTRAVENOUS | Status: DC | PRN
  Administered 2022-10-07: 10 mL via INTRAVENOUS

## 2022-10-07 NOTE — Patient Instructions (Signed)

## 2022-10-07 NOTE — Progress Notes (Signed)
Pt's Hemoglobin noted at 11.6. No injection needed per orders. Copy of lab given to patient. Pt instructed to flow schedule and call office if issues occur.

## 2022-10-27 DIAGNOSIS — Z1283 Encounter for screening for malignant neoplasm of skin: Secondary | ICD-10-CM | POA: Diagnosis not present

## 2022-10-27 DIAGNOSIS — Z85820 Personal history of malignant melanoma of skin: Secondary | ICD-10-CM | POA: Diagnosis not present

## 2022-10-27 DIAGNOSIS — X32XXXD Exposure to sunlight, subsequent encounter: Secondary | ICD-10-CM | POA: Diagnosis not present

## 2022-10-27 DIAGNOSIS — Z08 Encounter for follow-up examination after completed treatment for malignant neoplasm: Secondary | ICD-10-CM | POA: Diagnosis not present

## 2022-10-27 DIAGNOSIS — L570 Actinic keratosis: Secondary | ICD-10-CM | POA: Diagnosis not present

## 2022-10-28 ENCOUNTER — Inpatient Hospital Stay: Payer: PPO

## 2022-10-28 ENCOUNTER — Encounter: Payer: Self-pay | Admitting: Hematology & Oncology

## 2022-10-28 ENCOUNTER — Inpatient Hospital Stay: Payer: PPO | Attending: Hematology & Oncology | Admitting: Hematology & Oncology

## 2022-10-28 VITALS — Ht 71.0 in | Wt 203.0 lb

## 2022-10-28 VITALS — BP 137/83 | HR 91 | Temp 98.1°F | Resp 19

## 2022-10-28 DIAGNOSIS — Z95828 Presence of other vascular implants and grafts: Secondary | ICD-10-CM

## 2022-10-28 DIAGNOSIS — C439 Malignant melanoma of skin, unspecified: Secondary | ICD-10-CM | POA: Diagnosis not present

## 2022-10-28 DIAGNOSIS — C8583 Other specified types of non-Hodgkin lymphoma, intra-abdominal lymph nodes: Secondary | ICD-10-CM | POA: Diagnosis not present

## 2022-10-28 DIAGNOSIS — D631 Anemia in chronic kidney disease: Secondary | ICD-10-CM | POA: Diagnosis not present

## 2022-10-28 DIAGNOSIS — Z79899 Other long term (current) drug therapy: Secondary | ICD-10-CM | POA: Diagnosis not present

## 2022-10-28 DIAGNOSIS — C884 Extranodal marginal zone B-cell lymphoma of mucosa-associated lymphoid tissue [MALT-lymphoma]: Secondary | ICD-10-CM | POA: Insufficient documentation

## 2022-10-28 DIAGNOSIS — N1831 Chronic kidney disease, stage 3a: Secondary | ICD-10-CM | POA: Diagnosis present

## 2022-10-28 DIAGNOSIS — M7989 Other specified soft tissue disorders: Secondary | ICD-10-CM | POA: Diagnosis not present

## 2022-10-28 DIAGNOSIS — E038 Other specified hypothyroidism: Secondary | ICD-10-CM

## 2022-10-28 LAB — CBC WITH DIFFERENTIAL (CANCER CENTER ONLY)
Abs Immature Granulocytes: 0.09 10*3/uL — ABNORMAL HIGH (ref 0.00–0.07)
Basophils Absolute: 0 10*3/uL (ref 0.0–0.1)
Basophils Relative: 1 %
Eosinophils Absolute: 0 10*3/uL (ref 0.0–0.5)
Eosinophils Relative: 1 %
HCT: 32.9 % — ABNORMAL LOW (ref 39.0–52.0)
Hemoglobin: 10.9 g/dL — ABNORMAL LOW (ref 13.0–17.0)
Immature Granulocytes: 4 %
Lymphocytes Relative: 28 %
Lymphs Abs: 0.7 10*3/uL (ref 0.7–4.0)
MCH: 30.6 pg (ref 26.0–34.0)
MCHC: 33.1 g/dL (ref 30.0–36.0)
MCV: 92.4 fL (ref 80.0–100.0)
Monocytes Absolute: 0.5 10*3/uL (ref 0.1–1.0)
Monocytes Relative: 18 %
Neutro Abs: 1.3 10*3/uL — ABNORMAL LOW (ref 1.7–7.7)
Neutrophils Relative %: 48 %
Platelet Count: 127 10*3/uL — ABNORMAL LOW (ref 150–400)
RBC: 3.56 MIL/uL — ABNORMAL LOW (ref 4.22–5.81)
RDW: 12.8 % (ref 11.5–15.5)
WBC Count: 2.6 10*3/uL — ABNORMAL LOW (ref 4.0–10.5)
nRBC: 0 % (ref 0.0–0.2)

## 2022-10-28 LAB — CMP (CANCER CENTER ONLY)
ALT: 16 U/L (ref 0–44)
AST: 20 U/L (ref 15–41)
Albumin: 4.1 g/dL (ref 3.5–5.0)
Alkaline Phosphatase: 60 U/L (ref 38–126)
Anion gap: 7 (ref 5–15)
BUN: 23 mg/dL (ref 8–23)
CO2: 28 mmol/L (ref 22–32)
Calcium: 9.3 mg/dL (ref 8.9–10.3)
Chloride: 104 mmol/L (ref 98–111)
Creatinine: 1.47 mg/dL — ABNORMAL HIGH (ref 0.61–1.24)
GFR, Estimated: 51 mL/min — ABNORMAL LOW (ref >60)
Glucose, Bld: 116 mg/dL — ABNORMAL HIGH (ref 70–99)
Potassium: 4 mmol/L (ref 3.5–5.1)
Sodium: 139 mmol/L (ref 135–145)
Total Bilirubin: 0.4 mg/dL (ref 0.3–1.2)
Total Protein: 6.6 g/dL (ref 6.5–8.1)

## 2022-10-28 LAB — IRON AND IRON BINDING CAPACITY (CC-WL,HP ONLY)
Iron: 90 ug/dL (ref 45–182)
Saturation Ratios: 27 % (ref 17.9–39.5)
TIBC: 333 ug/dL (ref 250–450)
UIBC: 243 ug/dL (ref 117–376)

## 2022-10-28 LAB — TSH: TSH: 0.032 u[IU]/mL — ABNORMAL LOW (ref 0.350–4.500)

## 2022-10-28 LAB — FERRITIN: Ferritin: 102 ng/mL (ref 24–336)

## 2022-10-28 LAB — LACTATE DEHYDROGENASE: LDH: 199 U/L — ABNORMAL HIGH (ref 98–192)

## 2022-10-28 MED ORDER — SODIUM CHLORIDE 0.9% FLUSH
10.0000 mL | Freq: Once | INTRAVENOUS | Status: AC
  Administered 2022-10-28: 10 mL via INTRAVENOUS

## 2022-10-28 MED ORDER — DARBEPOETIN ALFA 300 MCG/0.6ML IJ SOSY
300.0000 ug | PREFILLED_SYRINGE | Freq: Once | INTRAMUSCULAR | Status: AC
  Administered 2022-10-28: 300 ug via SUBCUTANEOUS
  Filled 2022-10-28: qty 0.6

## 2022-10-28 MED ORDER — HEPARIN SOD (PORK) LOCK FLUSH 100 UNIT/ML IV SOLN
500.0000 [IU] | Freq: Once | INTRAVENOUS | Status: AC
  Administered 2022-10-28: 500 [IU] via INTRAVENOUS

## 2022-10-28 NOTE — Progress Notes (Signed)
Hematology and Oncology Follow Up Visit  [name] [name] [id number] December 05, 1950 72 y.o. 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  He feels okay.  He is done well with the Aranesp.  He does require an injection today but his hemoglobin is 10.9 which is really not all that bad.  His IgM level keeps coming down.  Over last saw him back in June, the IgM level was 280 mg/dL.  The Kappa light chain was 5.5 mg/dL.  The monoclonal spike was 0.3 g/dL.   He has had no problems with cough.  He had a cough only last saw him.  This is better.  He has had no nausea or vomiting.  Has had no change in bowel or bladder habits.  He did have some precancerous skin lesions taken care of by his dermatologist recently.  He has had no leg swelling.  On occasion his legs do swell but this is better now that his hemoglobin is improved.  I noted that his white cell count has dropped a little bit.  We will have to be cognizant of this.  Overall, I would say that his performance status is probably ECOG 1.    Medications:  Current Outpatient Medications:    famciclovir (FAMVIR) 250 MG tablet, Take 1 tablet (250 mg total) by mouth daily., Disp: 30 tablet, Rfl: 12   furosemide (LASIX) 40 MG tablet, Take 40 mg by mouth daily as needed., Disp: , Rfl:    hydrALAZINE (APRESOLINE) 25 MG tablet, TAKE 1 TABLET BY MOUTH THREE TIMES A DAY, Disp: 270 tablet, Rfl: 1   levothyroxine (SYNTHROID) 137 MCG tablet, Take 1 tablet  (137 mcg total) by mouth daily before breakfast., Disp: 90 tablet, Rfl: 3   Multiple Vitamins-Minerals (CENTRUM SILVER 50+MEN PO), Take by mouth., Disp: , Rfl:    rosuvastatin (CRESTOR) 20 MG tablet, TAKE 1 TABLET BY MOUTH EVERY DAY, Disp: 90 tablet, Rfl: 1   venlafaxine XR (EFFEXOR-XR) 37.5 MG 24 hr capsule, TAKE 1 CAPSULE BY MOUTH DAILY WITH BREAKFAST., Disp: 90 capsule, Rfl: 2   Ipratropium-Albuterol (COMBIVENT) 20-100 MCG/ACT AERS respimat, Inhale 1 puff into the lungs every 6 (six) hours. (Patient not taking: Reported on 10/28/2022), Disp: 1 each, Rfl: 4   Melatonin 10 MG SUBL, Place 10 mg under the tongue as needed. (Patient not taking: Reported on 10/28/2022), Disp: , Rfl:    ondansetron (ZOFRAN) 8 MG tablet, Take 8 mg by mouth every 8 (eight) hours as needed for nausea or vomiting (start 3rd day after chemotherapy). (Patient not taking: Reported on 10/28/2022), Disp: , Rfl:    prochlorperazine (COMPAZINE) 10 MG tablet, Take 10 mg by mouth every 6 (six) hours as needed for nausea or vomiting. (Patient not taking: Reported on 10/28/2022), Disp: , Rfl:   Allergies: No Known Allergies  Past Medical History, Surgical history, Social history, and Family History were reviewed and updated.  Review of Systems: Review of Systems  Constitutional: Negative.   HENT:  Negative.    Eyes: Negative.   Respiratory: Negative.    Cardiovascular: Negative.   Gastrointestinal:  Positive for abdominal pain and nausea.  Endocrine: Negative.   Genitourinary: Negative.    Musculoskeletal: Negative.   Skin: Negative.   Neurological: Negative.   Hematological: Negative.   Psychiatric/Behavioral: Negative.      Physical Exam: Vital signs are temperature of 98.1.  Pulse 91.  Blood pressure 137/83.  Weight is 203 pounds.  Wt Readings from Last 3 Encounters:  10/28/22 203 lb (92.1 kg)  09/16/22 197 lb (89.4 kg)  08/17/22 198 lb 1.9 oz (89.9 kg)    Physical Exam Vitals reviewed.  HENT:     Head:  Normocephalic and atraumatic.  Eyes:     Pupils: Pupils are equal, round, and reactive to light.  Cardiovascular:     Rate and Rhythm: Normal rate and regular rhythm.     Heart sounds: Normal heart sounds.  Pulmonary:     Effort: Pulmonary effort is normal.     Breath sounds: Normal breath sounds.  Abdominal:     General: Bowel sounds are normal.     Palpations: Abdomen is soft.  Musculoskeletal:        General: No tenderness or deformity. Normal range of motion.     Cervical back: Normal range of motion.  Lymphadenopathy:     Cervical: No cervical adenopathy.  Skin:    General: Skin is warm and dry.     Findings: No erythema or rash.  Neurological:     Mental Status: He is alert and oriented to person, place, and time.  Psychiatric:        Behavior: Behavior normal.        Thought Content: Thought content normal.        Judgment: Judgment normal.     Lab Results  Component Value Date   WBC 2.6 (L) 10/28/2022   HGB 10.9 (L) 10/28/2022   HCT 32.9 (L) 10/28/2022   MCV 92.4 10/28/2022   PLT 127 (L) 10/28/2022     Chemistry      Component Value Date/Time   NA 139 10/28/2022 1024   NA 138 12/06/2019 1501   NA 142 04/25/2016 0844   NA 140 10/19/2015 0903   K 4.0 10/28/2022 1024   K 4.1 04/25/2016 0844   K 4.1 10/19/2015 0903   CL 104 10/28/2022 1024   CL 102 04/25/2016 0844   CO2 28 10/28/2022 1024   CO2 29 04/25/2016 0844   CO2 25 10/19/2015 0903   BUN 23 10/28/2022 1024   BUN 20 12/06/2019 1501   BUN 14 04/25/2016 0844   BUN 21.6 10/19/2015 0903   CREATININE 1.47 (H) 10/28/2022 1024   CREATININE 1.1 04/25/2016 0844   CREATININE 1.3 10/19/2015 0903      Component Value Date/Time   CALCIUM 9.3 10/28/2022 1024   CALCIUM 9.6 04/25/2016 0844   CALCIUM 9.4 10/19/2015 0903   ALKPHOS 60 10/28/2022 1024   ALKPHOS 59 04/25/2016 0844   ALKPHOS 68 10/19/2015 0903   AST 20 10/28/2022 1024   AST 21 10/19/2015 0903   ALT 16 10/28/2022 1024   ALT 27 04/25/2016 0844    ALT 19 10/19/2015 0903   BILITOT 0.4 10/28/2022 1024   BILITOT 0.54 10/19/2015 0903      Impression and Plan: [name] [name] is a 72 year old white male.  We treated him 10 years ago.  He had a marginal zone lymphoma.  He went into clinical remission.  When he initially presented, he had splenomegaly.  When he relapsed, he again had splenomegaly.  We subsequently treated him with Imbruvica and venetoclax.  He completed his treatments back in April.  He has completed 6 cycles of Rituxan/Bendamustine.  He had a little bit of a tough time with his protocol.  However, the bone marrow looks fantastic.  We will just watch him.  We will see how his monoclonal studies look.  Again, we are watching his monoclonal studies.  I do not see an indication for a bone marrow or CAT scan right now.  I am glad that his leg swelling is better.  I figured this would improve if his hemoglobin improved.  We will go ahead and give him Aranesp today.  He will come in every 3 weeks for a CBC and possible Aranesp.  I will plan to see him back in 6 weeks.   [name] Macho, MD 8/9/202411:40 AM

## 2022-11-03 ENCOUNTER — Encounter: Payer: Self-pay | Admitting: *Deleted

## 2022-11-17 ENCOUNTER — Other Ambulatory Visit: Payer: Self-pay

## 2022-11-17 DIAGNOSIS — C8583 Other specified types of non-Hodgkin lymphoma, intra-abdominal lymph nodes: Secondary | ICD-10-CM

## 2022-11-18 ENCOUNTER — Inpatient Hospital Stay: Payer: PPO

## 2022-11-18 DIAGNOSIS — C8583 Other specified types of non-Hodgkin lymphoma, intra-abdominal lymph nodes: Secondary | ICD-10-CM

## 2022-11-18 DIAGNOSIS — Z95828 Presence of other vascular implants and grafts: Secondary | ICD-10-CM

## 2022-11-18 DIAGNOSIS — N1831 Chronic kidney disease, stage 3a: Secondary | ICD-10-CM | POA: Diagnosis not present

## 2022-11-18 LAB — CBC WITH DIFFERENTIAL (CANCER CENTER ONLY)
Abs Immature Granulocytes: 0 10*3/uL (ref 0.00–0.07)
Band Neutrophils: 1 %
Basophils Absolute: 0 10*3/uL (ref 0.0–0.1)
Basophils Relative: 0 %
Eosinophils Absolute: 0 10*3/uL (ref 0.0–0.5)
Eosinophils Relative: 2 %
HCT: 34.8 % — ABNORMAL LOW (ref 39.0–52.0)
Hemoglobin: 11.3 g/dL — ABNORMAL LOW (ref 13.0–17.0)
Lymphocytes Relative: 43 %
Lymphs Abs: 0.9 10*3/uL (ref 0.7–4.0)
MCH: 30.5 pg (ref 26.0–34.0)
MCHC: 32.5 g/dL (ref 30.0–36.0)
MCV: 93.8 fL (ref 80.0–100.0)
Metamyelocytes Relative: 1 %
Monocytes Absolute: 0.3 10*3/uL (ref 0.1–1.0)
Monocytes Relative: 13 %
Neutro Abs: 0.9 10*3/uL — ABNORMAL LOW (ref 1.7–7.7)
Neutrophils Relative %: 40 %
Platelet Count: 132 10*3/uL — ABNORMAL LOW (ref 150–400)
RBC: 3.71 MIL/uL — ABNORMAL LOW (ref 4.22–5.81)
RDW: 13.6 % (ref 11.5–15.5)
Smear Review: NORMAL
WBC Count: 2.2 10*3/uL — ABNORMAL LOW (ref 4.0–10.5)
nRBC: 0 % (ref 0.0–0.2)

## 2022-11-18 LAB — CMP (CANCER CENTER ONLY)
ALT: 12 U/L (ref 0–44)
AST: 18 U/L (ref 15–41)
Albumin: 4.2 g/dL (ref 3.5–5.0)
Alkaline Phosphatase: 56 U/L (ref 38–126)
Anion gap: 6 (ref 5–15)
BUN: 24 mg/dL — ABNORMAL HIGH (ref 8–23)
CO2: 28 mmol/L (ref 22–32)
Calcium: 9.6 mg/dL (ref 8.9–10.3)
Chloride: 104 mmol/L (ref 98–111)
Creatinine: 1.48 mg/dL — ABNORMAL HIGH (ref 0.61–1.24)
GFR, Estimated: 51 mL/min — ABNORMAL LOW (ref >60)
Glucose, Bld: 92 mg/dL (ref 70–99)
Potassium: 4.3 mmol/L (ref 3.5–5.1)
Sodium: 138 mmol/L (ref 135–145)
Total Bilirubin: 0.5 mg/dL (ref 0.3–1.2)
Total Protein: 6.6 g/dL (ref 6.5–8.1)

## 2022-11-18 MED ORDER — HEPARIN SOD (PORK) LOCK FLUSH 100 UNIT/ML IV SOLN
500.0000 [IU] | Freq: Once | INTRAVENOUS | Status: AC
  Administered 2022-11-18: 500 [IU] via INTRAVENOUS

## 2022-11-18 MED ORDER — SODIUM CHLORIDE 0.9% FLUSH
10.0000 mL | Freq: Once | INTRAVENOUS | Status: AC
  Administered 2022-11-18: 10 mL via INTRAVENOUS

## 2022-11-18 NOTE — Progress Notes (Signed)
No injection needed

## 2022-11-18 NOTE — Patient Instructions (Signed)

## 2022-12-04 ENCOUNTER — Other Ambulatory Visit: Payer: Self-pay | Admitting: Internal Medicine

## 2022-12-09 ENCOUNTER — Inpatient Hospital Stay: Payer: PPO | Attending: Hematology & Oncology | Admitting: Hematology & Oncology

## 2022-12-09 ENCOUNTER — Inpatient Hospital Stay: Payer: PPO

## 2022-12-09 ENCOUNTER — Encounter: Payer: Self-pay | Admitting: Hematology & Oncology

## 2022-12-09 ENCOUNTER — Inpatient Hospital Stay (HOSPITAL_BASED_OUTPATIENT_CLINIC_OR_DEPARTMENT_OTHER): Payer: PPO

## 2022-12-09 VITALS — BP 124/69 | HR 73 | Temp 98.5°F | Resp 19 | Ht 71.0 in | Wt 203.8 lb

## 2022-12-09 DIAGNOSIS — Z79899 Other long term (current) drug therapy: Secondary | ICD-10-CM | POA: Insufficient documentation

## 2022-12-09 DIAGNOSIS — C8583 Other specified types of non-Hodgkin lymphoma, intra-abdominal lymph nodes: Secondary | ICD-10-CM

## 2022-12-09 DIAGNOSIS — N1831 Chronic kidney disease, stage 3a: Secondary | ICD-10-CM

## 2022-12-09 DIAGNOSIS — C884 Extranodal marginal zone B-cell lymphoma of mucosa-associated lymphoid tissue [MALT-lymphoma]: Secondary | ICD-10-CM | POA: Insufficient documentation

## 2022-12-09 DIAGNOSIS — C439 Malignant melanoma of skin, unspecified: Secondary | ICD-10-CM

## 2022-12-09 DIAGNOSIS — D631 Anemia in chronic kidney disease: Secondary | ICD-10-CM | POA: Insufficient documentation

## 2022-12-09 LAB — CBC WITH DIFFERENTIAL (CANCER CENTER ONLY)
Abs Immature Granulocytes: 0.13 10*3/uL — ABNORMAL HIGH (ref 0.00–0.07)
Basophils Absolute: 0 10*3/uL (ref 0.0–0.1)
Basophils Relative: 1 %
Eosinophils Absolute: 0.1 10*3/uL (ref 0.0–0.5)
Eosinophils Relative: 2 %
HCT: 33.5 % — ABNORMAL LOW (ref 39.0–52.0)
Hemoglobin: 11 g/dL — ABNORMAL LOW (ref 13.0–17.0)
Immature Granulocytes: 5 %
Lymphocytes Relative: 27 %
Lymphs Abs: 0.8 10*3/uL (ref 0.7–4.0)
MCH: 30.6 pg (ref 26.0–34.0)
MCHC: 32.8 g/dL (ref 30.0–36.0)
MCV: 93.1 fL (ref 80.0–100.0)
Monocytes Absolute: 0.6 10*3/uL (ref 0.1–1.0)
Monocytes Relative: 20 %
Neutro Abs: 1.3 10*3/uL — ABNORMAL LOW (ref 1.7–7.7)
Neutrophils Relative %: 45 %
Platelet Count: 131 10*3/uL — ABNORMAL LOW (ref 150–400)
RBC: 3.6 MIL/uL — ABNORMAL LOW (ref 4.22–5.81)
RDW: 13.2 % (ref 11.5–15.5)
WBC Count: 2.9 10*3/uL — ABNORMAL LOW (ref 4.0–10.5)
nRBC: 0 % (ref 0.0–0.2)

## 2022-12-09 LAB — CMP (CANCER CENTER ONLY)
ALT: 14 U/L (ref 0–44)
AST: 18 U/L (ref 15–41)
Albumin: 4.2 g/dL (ref 3.5–5.0)
Alkaline Phosphatase: 57 U/L (ref 38–126)
Anion gap: 7 (ref 5–15)
BUN: 25 mg/dL — ABNORMAL HIGH (ref 8–23)
CO2: 29 mmol/L (ref 22–32)
Calcium: 9.1 mg/dL (ref 8.9–10.3)
Chloride: 103 mmol/L (ref 98–111)
Creatinine: 1.45 mg/dL — ABNORMAL HIGH (ref 0.61–1.24)
GFR, Estimated: 52 mL/min — ABNORMAL LOW
Glucose, Bld: 89 mg/dL (ref 70–99)
Potassium: 4.3 mmol/L (ref 3.5–5.1)
Sodium: 139 mmol/L (ref 135–145)
Total Bilirubin: 0.5 mg/dL (ref 0.3–1.2)
Total Protein: 6.5 g/dL (ref 6.5–8.1)

## 2022-12-09 LAB — LACTATE DEHYDROGENASE: LDH: 199 U/L — ABNORMAL HIGH (ref 98–192)

## 2022-12-09 NOTE — Progress Notes (Signed)
Hematology and Oncology Follow Up Visit  [name] [name] [id number] 02-19-1951 72 y.o. 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 is back for follow-up.  He is doing pretty well.  He really has had no specific complaints.  He has had no problems with nausea or vomiting.  He has had no problems with fever.  He has had no bleeding.  There has been no change in bowel or bladder habits.  As far as he [name] [name], only last saw him, his monoclonal spike was stable at 0.3 g/dL.  His IgM level was 325 mg/dL.  The Kappa light chain was 4.5 mg/dL.  We are focusing more so on his anemia.  The Aranesp does help quite a bit.  When we last saw him, his ferritin was 102 with an iron saturation of 27%.  He has had no swelling in the lower legs.  This seems to be a little bit better.  Looks like his renal function seems to be improving slowly but surely.  His white cell count is always on the lower side.  I am sure this is probably from his past treatment with Rituxan/Bendamustine.  Currently, I would say his performance status is probably ECOG 1.      Medications:  Current Outpatient Medications:    hydrALAZINE (APRESOLINE) 25 MG tablet, TAKE 1 TABLET BY MOUTH THREE TIMES A DAY, Disp: 270 tablet, Rfl: 1   levothyroxine (SYNTHROID) 137 MCG tablet, Take 1 tablet (137 mcg total) by mouth daily before breakfast., Disp: 90 tablet, Rfl: 3   Multiple Vitamins-Minerals (CENTRUM SILVER 50+MEN PO), Take by mouth., Disp:  , Rfl:    rosuvastatin (CRESTOR) 20 MG tablet, TAKE 1 TABLET BY MOUTH EVERY DAY, Disp: 90 tablet, Rfl: 1   venlafaxine XR (EFFEXOR-XR) 37.5 MG 24 hr capsule, TAKE 1 CAPSULE BY MOUTH DAILY WITH BREAKFAST., Disp: 90 capsule, Rfl: 2   famciclovir (FAMVIR) 250 MG tablet, Take 1 tablet (250 mg total) by mouth daily. (Patient not taking: Reported on 12/09/2022), Disp: 30 tablet, Rfl: 12   furosemide (LASIX) 40 MG tablet, Take 40 mg by mouth daily as needed. (Patient not taking: Reported on 12/09/2022), Disp: , Rfl:    Ipratropium-Albuterol (COMBIVENT) 20-100 MCG/ACT AERS respimat, Inhale 1 puff into the lungs every 6 (six) hours. (Patient not taking: Reported on 10/28/2022), Disp: 1 each, Rfl: 4   Melatonin 10 MG SUBL, Place 10 mg under the tongue as needed. (Patient not taking: Reported on 10/28/2022), Disp: , Rfl:    ondansetron (ZOFRAN) 8 MG tablet, Take 8 mg by mouth every 8 (eight) hours as needed for nausea or vomiting (start 3rd day after chemotherapy). (Patient not taking: Reported on 10/28/2022), Disp: , Rfl:    prochlorperazine (COMPAZINE) 10 MG tablet, Take 10 mg by mouth every 6 (six) hours as needed for nausea or vomiting. (Patient not taking: Reported on 10/28/2022), Disp: , Rfl:   Allergies: No Known Allergies  Past Medical History, Surgical history, Social history, and Family History  were reviewed and updated.  Review of Systems: Review of Systems  Constitutional: Negative.   HENT:  Negative.    Eyes: Negative.   Respiratory: Negative.    Cardiovascular: Negative.   Gastrointestinal:  Positive for abdominal pain and nausea.  Endocrine: Negative.   Genitourinary: Negative.    Musculoskeletal: Negative.   Skin: Negative.   Neurological: Negative.   Hematological: Negative.   Psychiatric/Behavioral: Negative.      Physical Exam: Vital signs are temperature of 98.5.  Pulse 73.  Blood pressure 124/69.  Weight is 203 pounds.    Wt Readings from Last 3 Encounters:  12/09/22 203 lb 12.8 oz  (92.4 kg)  10/28/22 203 lb (92.1 kg)  09/16/22 197 lb (89.4 kg)    Physical Exam Vitals reviewed.  HENT:     Head: Normocephalic and atraumatic.  Eyes:     Pupils: Pupils are equal, round, and reactive to light.  Cardiovascular:     Rate and Rhythm: Normal rate and regular rhythm.     Heart sounds: Normal heart sounds.  Pulmonary:     Effort: Pulmonary effort is normal.     Breath sounds: Normal breath sounds.  Abdominal:     General: Bowel sounds are normal.     Palpations: Abdomen is soft.  Musculoskeletal:        General: No tenderness or deformity. Normal range of motion.     Cervical back: Normal range of motion.  Lymphadenopathy:     Cervical: No cervical adenopathy.  Skin:    General: Skin is warm and dry.     Findings: No erythema or rash.  Neurological:     Mental Status: He is alert and oriented to person, place, and time.  Psychiatric:        Behavior: Behavior normal.        Thought Content: Thought content normal.        Judgment: Judgment normal.     Lab Results  Component Value Date   WBC 2.9 (L) 12/09/2022   HGB 11.0 (L) 12/09/2022   HCT 33.5 (L) 12/09/2022   MCV 93.1 12/09/2022   PLT 131 (L) 12/09/2022     Chemistry      Component Value Date/Time   NA 139 12/09/2022 1045   NA 138 12/06/2019 1501   NA 142 04/25/2016 0844   NA 140 10/19/2015 0903   K 4.3 12/09/2022 1045   K 4.1 04/25/2016 0844   K 4.1 10/19/2015 0903   CL 103 12/09/2022 1045   CL 102 04/25/2016 0844   CO2 29 12/09/2022 1045   CO2 29 04/25/2016 0844   CO2 25 10/19/2015 0903   BUN 25 (H) 12/09/2022 1045   BUN 20 12/06/2019 1501   BUN 14 04/25/2016 0844   BUN 21.6 10/19/2015 0903   CREATININE 1.45 (H) 12/09/2022 1045   CREATININE 1.1 04/25/2016 0844   CREATININE 1.3 10/19/2015 0903      Component Value Date/Time   CALCIUM 9.1 12/09/2022 1045   CALCIUM 9.6 04/25/2016 0844   CALCIUM 9.4 10/19/2015 0903   ALKPHOS 57 12/09/2022 1045   ALKPHOS 59 04/25/2016 0844    ALKPHOS 68 10/19/2015 0903   AST 18 12/09/2022 1045   AST 21 10/19/2015 0903   ALT 14 12/09/2022 1045   ALT 27 04/25/2016 0844   ALT 19 10/19/2015 0903   BILITOT 0.5 12/09/2022 1045   BILITOT 0.54 10/19/2015 0903      Impression and Plan: [name] is a 72 year old white  male.  We treated him 10 years ago.  He had a marginal zone lymphoma.  He went into clinical remission.  When he initially presented, he had splenomegaly.  When he relapsed, he again had splenomegaly.  We subsequently treated him with Imbruvica and venetoclax.  He completed his treatments back in April.  He has completed 6 cycles of Rituxan/Bendamustine.  He had a little bit of a tough time with his protocol.  However, the bone marrow looks fantastic.  We will just watch him.  We will see how his monoclonal studies look.  I am glad that he is doing well with his anemia.  At least, his hemoglobin is better.  His quality life is a little bit better.  We will probably have him come back in 4 weeks for CBC and possible Aranesp.  I will plan to see him back in 2 months.    [name], MD 9/20/202411:45 AM

## 2022-12-09 NOTE — Patient Instructions (Signed)
Implanted [facility] Guide An implanted port is a device that is placed under the skin. It is usually placed in the chest. The device may vary based on the need. Implanted ports can be used to give IV medicine, to take blood, or to give fluids. You may have an implanted port if: You need IV medicine that would be irritating to the small veins in your hands or arms. You need IV medicines, such as chemotherapy, for a long period of time. You need IV nutrition for a long period of time. You may have fewer limitations when using a port than you would if you used other types of long-term IVs. You will also likely be able to return to normal activities after your incision heals. An implanted port has two main parts: Reservoir. The reservoir is the part where a needle is inserted to give medicines or draw blood. The reservoir is round. After the port is placed, it appears as a small, raised area under your skin. Catheter. The catheter is a small, thin tube that connects the reservoir to a vein. Medicine that is inserted into the reservoir goes into the catheter and then into the vein. How is my port accessed? To access your port: A numbing cream may be placed on the skin over the port site. Your health care provider will put on a mask and sterile gloves. The skin over your port will be cleaned carefully with a germ-killing soap and allowed to dry. Your health care provider will gently pinch the port and insert a needle into it. Your health care provider will check for a blood return to make sure the port is in the vein and is still working (patent). If your port needs to remain accessed to get medicine continuously (constant infusion), your health care provider will place a clear bandage (dressing) over the needle site. The dressing and needle will need to be changed every week, or as told by your health care provider. What is flushing? Flushing helps keep the port working. Follow instructions from your  health care provider about how and when to flush the port. Ports are usually flushed with saline solution or a medicine called heparin. The need for flushing will depend on how the port is used: If the port is only used from time to time to give medicines or draw blood, the port may need to be flushed: Before and after medicines have been given. Before and after blood has been drawn. As part of routine maintenance. Flushing may be recommended every 4-6 weeks. If a constant infusion is running, the port may not need to be flushed. Throw away any syringes in a disposal container that is meant for sharp items (sharps container). You can buy a sharps container from a pharmacy, or you can make one by using an empty hard plastic bottle with a cover. How long will my port stay implanted? The port can stay in for as long as your health care provider thinks it is needed. When it is time for the port to come out, a surgery will be done to remove it. The surgery will be similar to the procedure that was done to put the port in. Follow these instructions at home: Caring for your port and port site Flush your port as told by your health care provider. If you need an infusion over several days, follow instructions from your health care provider about how to take care of your port site. Make sure you: Change your  dressing as told by your health care provider. Wash your hands with soap and water for at least 20 seconds before and after you change your dressing. If soap and water are not available, use alcohol-based hand sanitizer. Place any used dressings or infusion bags into a plastic bag. Throw that bag in the trash. Keep the dressing that covers the needle clean and dry. Do not get it wet. Do not use scissors or sharp objects near the infusion tubing. Keep any external tubes clamped, unless they are being used. Check your port site every day for signs of infection. Check for: Redness, swelling, or  pain. Fluid or blood. Warmth. Pus or a bad smell. Protect the skin around the port site. Avoid wearing bra straps that rub or irritate the site. Protect the skin around your port from seat belts. Place a soft pad over your chest if needed. Bathe or shower as told by your health care provider. The site may get wet as long as you are not actively receiving an infusion. General instructions  Return to your normal activities as told by your health care provider. Ask your health care provider what activities are safe for you. Carry a medical alert card or wear a medical alert bracelet at all times. This will let health care providers know that you have an implanted port in case of an emergency. Where to find more information American Cancer Society: www.cancer.org American Society of Clinical Oncology: www.cancer.net Contact a health care provider if: You have a fever or chills. You have redness, swelling, or pain at the port site. You have fluid or blood coming from your port site. Your incision feels warm to the touch. You have pus or a bad smell coming from the port site. Summary Implanted ports are usually placed in the chest for long-term IV access. Follow instructions from your health care provider about flushing the port and changing bandages (dressings). Take care of the area around your port by avoiding clothing that puts pressure on the area, and by watching for signs of infection. Protect the skin around your port from seat belts. Place a soft pad over your chest if needed. Contact a health care provider if you have a fever or you have redness, swelling, pain, fluid, or a bad smell at the port site. This information is not intended to replace advice given to you by your health care provider. Make sure you discuss any questions you have with your health care provider. Document Revised: 09/08/2020 Document Reviewed: 09/08/2020 Elsevier Patient Education  2024 [employer].

## 2022-12-09 NOTE — Patient Instructions (Signed)

## 2022-12-11 LAB — IGG, IGA, IGM
IgA: 73 mg/dL (ref 61–437)
IgG (Immunoglobin G), Serum: 420 mg/dL — ABNORMAL LOW (ref 603–1613)
IgM (Immunoglobulin M), Srm: 324 mg/dL — ABNORMAL HIGH (ref 20–172)

## 2022-12-12 LAB — KAPPA/LAMBDA LIGHT CHAINS
Kappa free light chain: 49 mg/L — ABNORMAL HIGH (ref 3.3–19.4)
Kappa, lambda light chain ratio: 5.98 — ABNORMAL HIGH (ref 0.26–1.65)
Lambda free light chains: 8.2 mg/L (ref 5.7–26.3)

## 2022-12-15 ENCOUNTER — Encounter: Payer: Self-pay | Admitting: *Deleted

## 2022-12-15 LAB — PROTEIN ELECTROPHORESIS, SERUM, WITH REFLEX
A/G Ratio: 1.3 (ref 0.7–1.7)
Albumin ELP: 3.6 g/dL (ref 2.9–4.4)
Alpha-1-Globulin: 0.2 g/dL (ref 0.0–0.4)
Alpha-2-Globulin: 0.9 g/dL (ref 0.4–1.0)
Beta Globulin: 0.9 g/dL (ref 0.7–1.3)
Gamma Globulin: 0.7 g/dL (ref 0.4–1.8)
Globulin, Total: 2.7 g/dL (ref 2.2–3.9)
M-Spike, %: 0.3 g/dL — ABNORMAL HIGH
SPEP Interpretation: 0
Total Protein ELP: 6.3 g/dL (ref 6.0–8.5)

## 2022-12-15 LAB — IMMUNOFIXATION REFLEX, SERUM
IgA: 79 mg/dL (ref 61–437)
IgG (Immunoglobin G), Serum: 454 mg/dL — ABNORMAL LOW (ref 603–1613)
IgM (Immunoglobulin M), Srm: 347 mg/dL — ABNORMAL HIGH (ref 20–172)

## 2023-01-06 ENCOUNTER — Inpatient Hospital Stay: Payer: PPO

## 2023-01-06 ENCOUNTER — Inpatient Hospital Stay: Payer: PPO | Attending: Hematology & Oncology

## 2023-01-06 VITALS — BP 136/69 | HR 91 | Resp 18

## 2023-01-06 DIAGNOSIS — D631 Anemia in chronic kidney disease: Secondary | ICD-10-CM | POA: Diagnosis not present

## 2023-01-06 DIAGNOSIS — C884 Extranodal marginal zone b-cell lymphoma of mucosa-associated lymphoid tissue (malt-lymphoma) not having achieved remission: Secondary | ICD-10-CM | POA: Insufficient documentation

## 2023-01-06 DIAGNOSIS — N1831 Chronic kidney disease, stage 3a: Secondary | ICD-10-CM | POA: Diagnosis not present

## 2023-01-06 DIAGNOSIS — C8583 Other specified types of non-Hodgkin lymphoma, intra-abdominal lymph nodes: Secondary | ICD-10-CM

## 2023-01-06 DIAGNOSIS — Z95828 Presence of other vascular implants and grafts: Secondary | ICD-10-CM

## 2023-01-06 LAB — CBC WITH DIFFERENTIAL (CANCER CENTER ONLY)
Abs Immature Granulocytes: 0.27 10*3/uL — ABNORMAL HIGH (ref 0.00–0.07)
Basophils Absolute: 0 10*3/uL (ref 0.0–0.1)
Basophils Relative: 1 %
Eosinophils Absolute: 0 10*3/uL (ref 0.0–0.5)
Eosinophils Relative: 1 %
HCT: 32.8 % — ABNORMAL LOW (ref 39.0–52.0)
Hemoglobin: 11.1 g/dL — ABNORMAL LOW (ref 13.0–17.0)
Immature Granulocytes: 9 %
Lymphocytes Relative: 26 %
Lymphs Abs: 0.8 10*3/uL (ref 0.7–4.0)
MCH: 31.4 pg (ref 26.0–34.0)
MCHC: 33.8 g/dL (ref 30.0–36.0)
MCV: 92.9 fL (ref 80.0–100.0)
Monocytes Absolute: 0.5 10*3/uL (ref 0.1–1.0)
Monocytes Relative: 16 %
Neutro Abs: 1.5 10*3/uL — ABNORMAL LOW (ref 1.7–7.7)
Neutrophils Relative %: 47 %
Platelet Count: 134 10*3/uL — ABNORMAL LOW (ref 150–400)
RBC: 3.53 MIL/uL — ABNORMAL LOW (ref 4.22–5.81)
RDW: 13 % (ref 11.5–15.5)
Smear Review: NORMAL
WBC Count: 3.1 10*3/uL — ABNORMAL LOW (ref 4.0–10.5)
nRBC: 0.6 % — ABNORMAL HIGH (ref 0.0–0.2)

## 2023-01-06 LAB — CMP (CANCER CENTER ONLY)
ALT: 13 U/L (ref 0–44)
AST: 18 U/L (ref 15–41)
Albumin: 4 g/dL (ref 3.5–5.0)
Alkaline Phosphatase: 54 U/L (ref 38–126)
Anion gap: 6 (ref 5–15)
BUN: 29 mg/dL — ABNORMAL HIGH (ref 8–23)
CO2: 28 mmol/L (ref 22–32)
Calcium: 9 mg/dL (ref 8.9–10.3)
Chloride: 105 mmol/L (ref 98–111)
Creatinine: 1.58 mg/dL — ABNORMAL HIGH (ref 0.61–1.24)
GFR, Estimated: 47 mL/min — ABNORMAL LOW (ref >60)
Glucose, Bld: 114 mg/dL — ABNORMAL HIGH (ref 70–99)
Potassium: 4.3 mmol/L (ref 3.5–5.1)
Sodium: 139 mmol/L (ref 135–145)
Total Bilirubin: 0.4 mg/dL (ref 0.3–1.2)
Total Protein: 6.3 g/dL — ABNORMAL LOW (ref 6.5–8.1)

## 2023-01-06 MED ORDER — SODIUM CHLORIDE 0.9% FLUSH
10.0000 mL | INTRAVENOUS | Status: DC | PRN
  Administered 2023-01-06: 10 mL via INTRAVENOUS

## 2023-01-06 MED ORDER — HEPARIN SOD (PORK) LOCK FLUSH 100 UNIT/ML IV SOLN
500.0000 [IU] | Freq: Once | INTRAVENOUS | Status: AC
  Administered 2023-01-06: 500 [IU] via INTRAVENOUS

## 2023-01-06 NOTE — Progress Notes (Signed)
Pt's labs revealed Hemoglobin at 11.1. No injection needed per MD's order. Pt instructed to maintain current appointments and call center should issues occur.  Pt  verbalized understanding of directions.

## 2023-01-06 NOTE — Patient Instructions (Signed)

## 2023-01-16 ENCOUNTER — Telehealth (INDEPENDENT_AMBULATORY_CARE_PROVIDER_SITE_OTHER): Payer: PPO | Admitting: Internal Medicine

## 2023-01-16 ENCOUNTER — Encounter: Payer: Self-pay | Admitting: Internal Medicine

## 2023-01-16 DIAGNOSIS — U071 COVID-19: Secondary | ICD-10-CM

## 2023-01-16 MED ORDER — NIRMATRELVIR/RITONAVIR (PAXLOVID) TABLET (RENAL DOSING): 2.0000 | ORAL_TABLET | Freq: Two times a day (BID) | ORAL | 0 refills | Status: AC

## 2023-01-16 NOTE — Assessment & Plan Note (Signed)
Acute Today is day 5 of his symptoms Symptoms are mild-moderate and he is high risk with his lymphoma Start Paxlovid at renal dose which will likely be more effective than molnupiravir and concerns about coverage with molnupiravir Discussed possible side effects otc medications for symptom relief Rest, fluids Call with questions/concerns

## 2023-01-16 NOTE — Progress Notes (Signed)
Virtual Visit via Video Note  I connected with [name] [name] on 01/16/23 at  2:40 PM EDT by a video enabled telemedicine application and verified that I am speaking with the correct person using two identifiers.   I discussed the limitations of evaluation and management by telemedicine and the availability of in person appointments. The patient expressed understanding and agreed to proceed.  Present for the visit:  Myself, Dr Cheryll Cockayne, [name].  The patient is currently at home and I am in the office.    No referring provider.    History of Present Illness: This is an acute visit for covid.   He has been having cold symptoms-COVID test at home this morning is positive  Symptoms started last Thursday-today is day 5 of symptoms  He states fever up to 104.4 over the weekend and 102 this morning.  He has been taking Tylenol as needed.  He states fatigue, mild sore throat, runny nose, cough, headaches and dizziness.  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   Review of Systems  Constitutional:  Positive for fever and malaise/fatigue.  HENT:  Positive for sore throat (mild). Negative for congestion.        Runny nose  Respiratory:  Positive for cough. Negative for shortness of breath and wheezing.   Musculoskeletal:  Negative for myalgias.  Neurological:  Positive for dizziness and headaches.       Social History   Socioeconomic History   Marital status: Married    Spouse name: Not on file   Number of children: 2   Years of education: Not on file   Highest education level: Not on file  Occupational History   Occupation: Retired    Comment: IT sales professional  Tobacco Use   Smoking status: Never   Smokeless tobacco: Never   Tobacco comments:    NEVER USED TOBACCO  Vaping Use   Vaping status: Never Used  Substance and Sexual Activity   Alcohol use: Yes    Alcohol/week: 0.0 standard drinks of alcohol    Comment:  1-2  wine / week   Drug use: No   Sexual activity: Not on file  Other Topics Concern   Not on file  Social History Narrative   Exercise:  No regular exercise, active, yardwork   Social Determinants of Health   Financial Resource Strain: Low Risk  (12/04/2020)   Overall Financial Resource Strain (CARDIA)    Difficulty of Paying Living Expenses: Not hard at all  Food Insecurity: No Food Insecurity (12/04/2020)   Hunger Vital Sign    Worried About Running Out of Food in the Last Year: Never true    Ran Out of Food in the Last Year: Never true  Transportation Needs: No Transportation Needs (12/04/2020)   PRAPARE - Administrator, Civil Service (Medical): No    Lack of Transportation (Non-Medical): No  Physical Activity: Inactive (12/04/2020)   Exercise Vital Sign    Days of Exercise per Week: 0 days    Minutes of Exercise per Session: 0 min  Stress: No Stress Concern Present (12/04/2020)   [employer] of Occupational Health - Occupational Stress Questionnaire    Feeling of Stress : Not at all  Social Connections: Socially Integrated (12/04/2020)   Social Connection and Isolation Panel [NHANES]    Frequency of Communication with Friends and Family: More than three times a week    Frequency of Social Gatherings with Friends and  Family: More than three times a week    Attends Religious Services: 1 to 4 times per year    Active Member of Clubs or Organizations: Yes    Attends Banker Meetings: 1 to 4 times per year    Marital Status: Married     Observations/Objective: Appears well in NAD Frequent cough.  No respiratory distress-no shortness of breath  Assessment and Plan:  See Problem List for Assessment and Plan of chronic medical problems.   Follow Up Instructions:    I discussed the assessment and treatment plan with the patient. The patient was provided an opportunity to ask questions and all were answered. The patient agreed with the plan and  demonstrated an understanding of the instructions.   The patient was advised to call back or seek an in-person evaluation if the symptoms worsen or if the condition fails to improve as anticipated.    Pincus Sanes, MD

## 2023-01-24 ENCOUNTER — Encounter: Payer: Self-pay | Admitting: Internal Medicine

## 2023-01-24 NOTE — Progress Notes (Unsigned)
Virtual Visit via Video Note  I connected with [name] [name] on 01/24/23 at 11:00 AM EST by a video enabled telemedicine application and verified that I am speaking with the correct person using two identifiers.   I discussed the limitations of evaluation and management by telemedicine and the availability of in person appointments. The patient expressed understanding and agreed to proceed.  Present for the visit:  Myself, Dr Cheryll Cockayne, [name].  The patient is currently at home and I am in the office.    No referring provider.    History of Present Illness: He had covid just over one week ago.  He took paxlovid.  He did feel better.  Last Friday the covid test was negative.  He started experiencing cold symptoms.  His symptoms started lower grade fever, fatigue, decreased appetite, sore throat, runny nose, cough that is productive of discolored sputum, mild SOB, mild wheeze and headaches.   He has tried taking robitussin, mucinex.    Review of Systems  Constitutional:  Positive for fever and malaise/fatigue.       Dec appetite  HENT:  Positive for sore throat. Negative for congestion and sinus pain.        Runny nose  Respiratory:  Positive for cough, sputum production, shortness of breath (mild) and wheezing (mild).   Musculoskeletal:  Negative for myalgias.  Neurological:  Positive for headaches.      Social History   Socioeconomic History   Marital status: Married    Spouse name: Not on file   Number of children: 2   Years of education: Not on file   Highest education level: Not on file  Occupational History   Occupation: Retired    Comment: IT sales professional  Tobacco Use   Smoking status: Never   Smokeless tobacco: Never   Tobacco comments:    NEVER USED TOBACCO  Vaping Use   Vaping status: Never Used  Substance and Sexual Activity   Alcohol use: Yes    Alcohol/week: 0.0 standard drinks of alcohol    Comment:  1-2 wine / week   Drug use: No    Sexual activity: Not on file  Other Topics Concern   Not on file  Social History Narrative   Exercise:  No regular exercise, active, yardwork   Social Determinants of Health   Financial Resource Strain: Low Risk  (12/04/2020)   Overall Financial Resource Strain (CARDIA)    Difficulty of Paying Living Expenses: Not hard at all  Food Insecurity: No Food Insecurity (12/04/2020)   Hunger Vital Sign    Worried About Running Out of Food in the Last Year: Never true    Ran Out of Food in the Last Year: Never true  Transportation Needs: No Transportation Needs (12/04/2020)   PRAPARE - Administrator, Civil Service (Medical): No    Lack of Transportation (Non-Medical): No  Physical Activity: Inactive (12/04/2020)   Exercise Vital Sign    Days of Exercise per Week: 0 days    Minutes of Exercise per Session: 0 min  Stress: No Stress Concern Present (12/04/2020)   [employer] of Occupational Health - Occupational Stress Questionnaire    Feeling of Stress : Not at all  Social Connections: Socially Integrated (12/04/2020)   Social Connection and Isolation Panel [NHANES]    Frequency of Communication with Friends and Family: More than three times a week    Frequency of Social Gatherings with Friends and Family: More than three times  a week    Attends Religious Services: 1 to 4 times per year    Active Member of Clubs or Organizations: Yes    Attends Banker Meetings: 1 to 4 times per year    Marital Status: Married     Observations/Objective: Appears well in NAD Cough that sounds wet Breathing normally, speaking in full sentences  Assessment and Plan:  See Problem List for Assessment and Plan of chronic medical problems.   Follow Up Instructions:    I discussed the assessment and treatment plan with the patient. The patient was provided an opportunity to ask questions and all were answered. The patient agreed with the plan and demonstrated an understanding of  the instructions.   The patient was advised to call back or seek an in-person evaluation if the symptoms worsen or if the condition fails to improve as anticipated.    Pincus Sanes, MD

## 2023-01-25 ENCOUNTER — Telehealth (INDEPENDENT_AMBULATORY_CARE_PROVIDER_SITE_OTHER): Payer: PPO | Admitting: Internal Medicine

## 2023-01-25 DIAGNOSIS — J209 Acute bronchitis, unspecified: Secondary | ICD-10-CM | POA: Diagnosis not present

## 2023-01-25 MED ORDER — DOXYCYCLINE HYCLATE 100 MG PO TABS: 100.0000 mg | ORAL_TABLET | Freq: Two times a day (BID) | ORAL | 0 refills | Status: AC

## 2023-01-25 NOTE — Assessment & Plan Note (Signed)
Acute Likely bacterial - secondary to recent covid infection Start Doxycyline 100 mg BID x 10 day otc cold medications Rest, fluid Call if no improvement

## 2023-02-10 ENCOUNTER — Other Ambulatory Visit: Payer: Self-pay | Admitting: *Deleted

## 2023-02-10 ENCOUNTER — Encounter: Payer: Self-pay | Admitting: Hematology & Oncology

## 2023-02-10 ENCOUNTER — Inpatient Hospital Stay: Payer: PPO

## 2023-02-10 ENCOUNTER — Inpatient Hospital Stay: Payer: PPO | Attending: Hematology & Oncology | Admitting: Hematology & Oncology

## 2023-02-10 VITALS — BP 142/75 | HR 86 | Temp 98.1°F | Resp 18 | Ht 71.0 in | Wt 195.0 lb

## 2023-02-10 DIAGNOSIS — E034 Atrophy of thyroid (acquired): Secondary | ICD-10-CM | POA: Diagnosis not present

## 2023-02-10 DIAGNOSIS — N1831 Chronic kidney disease, stage 3a: Secondary | ICD-10-CM | POA: Insufficient documentation

## 2023-02-10 DIAGNOSIS — C8583 Other specified types of non-Hodgkin lymphoma, intra-abdominal lymph nodes: Secondary | ICD-10-CM

## 2023-02-10 DIAGNOSIS — Z79620 Long term (current) use of immunosuppressive biologic: Secondary | ICD-10-CM | POA: Insufficient documentation

## 2023-02-10 DIAGNOSIS — Z79899 Other long term (current) drug therapy: Secondary | ICD-10-CM | POA: Diagnosis not present

## 2023-02-10 DIAGNOSIS — D631 Anemia in chronic kidney disease: Secondary | ICD-10-CM

## 2023-02-10 DIAGNOSIS — Z95828 Presence of other vascular implants and grafts: Secondary | ICD-10-CM

## 2023-02-10 LAB — CMP (CANCER CENTER ONLY)
ALT: 16 U/L (ref 0–44)
AST: 20 U/L (ref 15–41)
Albumin: 4.1 g/dL (ref 3.5–5.0)
Alkaline Phosphatase: 58 U/L (ref 38–126)
Anion gap: 8 (ref 5–15)
BUN: 24 mg/dL — ABNORMAL HIGH (ref 8–23)
CO2: 29 mmol/L (ref 22–32)
Calcium: 9.6 mg/dL (ref 8.9–10.3)
Chloride: 100 mmol/L (ref 98–111)
Creatinine: 1.39 mg/dL — ABNORMAL HIGH (ref 0.61–1.24)
GFR, Estimated: 55 mL/min — ABNORMAL LOW (ref >60)
Glucose, Bld: 93 mg/dL (ref 70–99)
Potassium: 4.1 mmol/L (ref 3.5–5.1)
Sodium: 137 mmol/L (ref 135–145)
Total Bilirubin: 0.4 mg/dL (ref <1.2)
Total Protein: 6.8 g/dL (ref 6.5–8.1)

## 2023-02-10 LAB — RETICULOCYTES
Immature Retic Fract: 13.2 % (ref 2.3–15.9)
RBC.: 3.31 MIL/uL — ABNORMAL LOW (ref 4.22–5.81)
Retic Count, Absolute: 59.9 10*3/uL (ref 19.0–186.0)
Retic Ct Pct: 1.8 % (ref 0.4–3.1)

## 2023-02-10 LAB — CBC WITH DIFFERENTIAL (CANCER CENTER ONLY)
Abs Immature Granulocytes: 0.17 10*3/uL — ABNORMAL HIGH (ref 0.00–0.07)
Basophils Absolute: 0 10*3/uL (ref 0.0–0.1)
Basophils Relative: 1 %
Eosinophils Absolute: 0.1 10*3/uL (ref 0.0–0.5)
Eosinophils Relative: 3 %
HCT: 31.2 % — ABNORMAL LOW (ref 39.0–52.0)
Hemoglobin: 10.5 g/dL — ABNORMAL LOW (ref 13.0–17.0)
Immature Granulocytes: 6 %
Lymphocytes Relative: 30 %
Lymphs Abs: 0.9 10*3/uL (ref 0.7–4.0)
MCH: 31.8 pg (ref 26.0–34.0)
MCHC: 33.7 g/dL (ref 30.0–36.0)
MCV: 94.5 fL (ref 80.0–100.0)
Monocytes Absolute: 0.5 10*3/uL (ref 0.1–1.0)
Monocytes Relative: 17 %
Neutro Abs: 1.3 10*3/uL — ABNORMAL LOW (ref 1.7–7.7)
Neutrophils Relative %: 43 %
Platelet Count: 151 10*3/uL (ref 150–400)
RBC: 3.3 MIL/uL — ABNORMAL LOW (ref 4.22–5.81)
RDW: 13 % (ref 11.5–15.5)
Smear Review: NORMAL
WBC Count: 3 10*3/uL — ABNORMAL LOW (ref 4.0–10.5)
nRBC: 0 % (ref 0.0–0.2)

## 2023-02-10 LAB — IRON AND IRON BINDING CAPACITY (CC-WL,HP ONLY)
Iron: 55 ug/dL (ref 45–182)
Saturation Ratios: 20 % (ref 17.9–39.5)
TIBC: 274 ug/dL (ref 250–450)
UIBC: 219 ug/dL (ref 117–376)

## 2023-02-10 LAB — FERRITIN: Ferritin: 246 ng/mL (ref 24–336)

## 2023-02-10 LAB — TSH: TSH: 0.061 u[IU]/mL — ABNORMAL LOW (ref 0.350–4.500)

## 2023-02-10 LAB — LACTATE DEHYDROGENASE: LDH: 184 U/L (ref 98–192)

## 2023-02-10 MED ORDER — SODIUM CHLORIDE (PF) 0.9 % IJ SOLN: 10.0000 mL | INTRAMUSCULAR | Status: DC | PRN

## 2023-02-10 MED ORDER — SODIUM CHLORIDE 0.9% FLUSH
10.0000 mL | Freq: Once | INTRAVENOUS | Status: AC
  Administered 2023-02-10: 10 mL via INTRAVENOUS

## 2023-02-10 MED ORDER — HEPARIN SOD (PORK) LOCK FLUSH 100 UNIT/ML IV SOLN
500.0000 [IU] | Freq: Once | INTRAVENOUS | Status: AC
  Administered 2023-02-10: 500 [IU] via INTRAVENOUS

## 2023-02-10 MED ORDER — DARBEPOETIN ALFA 300 MCG/0.6ML IJ SOSY
300.0000 ug | PREFILLED_SYRINGE | Freq: Once | INTRAMUSCULAR | Status: AC
  Administered 2023-02-10: 300 ug via SUBCUTANEOUS
  Filled 2023-02-10: qty 0.6

## 2023-02-10 NOTE — Patient Instructions (Signed)

## 2023-02-10 NOTE — Progress Notes (Signed)
Hematology and Oncology Follow Up Visit  [name] [name] [id number] 1950-04-15 72 y.o. 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 De Burrs is back for follow-up.  Unfortunately, he still not feeling all that great.  He still gets tired quite easily.  He does get some shortness of breath.  He had COVID a few weeks ago.  I am sure this probably is affecting him to some extent.  I have noticed that in the past, he has had a low IgG level.  This could certainly be part of the problem.  If he continues have a low IgG level, he might want to think about giving him IVIG to try to help bring up his immune system.  He is on Synthroid.  We are checking a TSH on him.  I will also check some iron studies.  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  When we last checked his monoclonal studies, his M spike was 0.3 g/dL.  The IgM level was 330 mg/dL.  The Kappa light chain was 4.9 mg/dL.  He has had no bleeding.  He does have some nocturia.  This might indicate that he may have all BPH.  He has had no rashes.  Overall, I would have to say that his performance status is ECOG 1.      Medications:  Current Outpatient Medications:    furosemide (LASIX) 40 MG tablet, Take 40 mg by mouth daily as needed., Disp: , Rfl:    hydrALAZINE (APRESOLINE) 25 MG tablet, TAKE 1 TABLET BY MOUTH THREE TIMES A DAY, Disp: 270 tablet, Rfl: 1   Ipratropium-Albuterol (COMBIVENT) 20-100 MCG/ACT AERS respimat, Inhale 1 puff into the lungs every 6  (six) hours., Disp: 1 each, Rfl: 4   levothyroxine (SYNTHROID) 137 MCG tablet, Take 1 tablet (137 mcg total) by mouth daily before breakfast., Disp: 90 tablet, Rfl: 3   Multiple Vitamins-Minerals (CENTRUM SILVER 50+MEN PO), Take by mouth., Disp: , Rfl:    rosuvastatin (CRESTOR) 20 MG tablet, TAKE 1 TABLET BY MOUTH EVERY DAY, Disp: 90 tablet, Rfl: 1   venlafaxine XR (EFFEXOR-XR) 37.5 MG 24 hr capsule, TAKE 1 CAPSULE BY MOUTH DAILY WITH BREAKFAST., Disp: 90 capsule, Rfl: 2  Allergies: No Known Allergies  Past Medical History, Surgical history, Social history, and Family History were reviewed and updated.  Review of Systems: Review of Systems  Constitutional: Negative.   HENT:  Negative.    Eyes: Negative.   Respiratory: Negative.    Cardiovascular: Negative.   Gastrointestinal:  Positive for abdominal pain and nausea.  Endocrine: Negative.   Genitourinary: Negative.    Musculoskeletal: Negative.   Skin: Negative.   Neurological: Negative.   Hematological: Negative.   Psychiatric/Behavioral: Negative.      Physical Exam: Vital signs are temperature of 98.1.  Pulse 86.  Blood pressure 142/75.  Weight is 195 pounds.     Wt Readings from Last 3 Encounters:  02/10/23 195 lb (88.5 kg)  12/09/22 203 lb 12.8 oz (92.4 kg)  10/28/22 203 lb (92.1 kg)    Physical Exam Vitals reviewed.  HENT:     Head: Normocephalic and atraumatic.  Eyes:     Pupils: Pupils are equal, round, and reactive to light.  Cardiovascular:     Rate and Rhythm: Normal rate and regular rhythm.     Heart sounds: Normal heart sounds.  Pulmonary:     Effort: Pulmonary effort is normal.     Breath sounds: Normal breath sounds.  Abdominal:     General: Bowel sounds are normal.     Palpations: Abdomen is soft.  Musculoskeletal:        General: No tenderness or deformity. Normal range of motion.     Cervical back: Normal range of motion.  Lymphadenopathy:     Cervical: No cervical adenopathy.  Skin:     General: Skin is warm and dry.     Findings: No erythema or rash.  Neurological:     Mental Status: He is alert and oriented to person, place, and time.  Psychiatric:        Behavior: Behavior normal.        Thought Content: Thought content normal.        Judgment: Judgment normal.    Lab Results  Component Value Date   WBC 3.0 (L) 02/10/2023   HGB 10.5 (L) 02/10/2023   HCT 31.2 (L) 02/10/2023   MCV 94.5 02/10/2023   PLT 151 02/10/2023     Chemistry      Component Value Date/Time   NA 137 02/10/2023 1215   NA 138 12/06/2019 1501   NA 142 04/25/2016 0844   NA 140 10/19/2015 0903   K 4.1 02/10/2023 1215   K 4.1 04/25/2016 0844   K 4.1 10/19/2015 0903   CL 100 02/10/2023 1215   CL 102 04/25/2016 0844   CO2 29 02/10/2023 1215   CO2 29 04/25/2016 0844   CO2 25 10/19/2015 0903   BUN 24 (H) 02/10/2023 1215   BUN 20 12/06/2019 1501   BUN 14 04/25/2016 0844   BUN 21.6 10/19/2015 0903   CREATININE 1.39 (H) 02/10/2023 1215   CREATININE 1.1 04/25/2016 0844   CREATININE 1.3 10/19/2015 0903      Component Value Date/Time   CALCIUM 9.6 02/10/2023 1215   CALCIUM 9.6 04/25/2016 0844   CALCIUM 9.4 10/19/2015 0903   ALKPHOS 58 02/10/2023 1215   ALKPHOS 59 04/25/2016 0844   ALKPHOS 68 10/19/2015 0903   AST 20 02/10/2023 1215   AST 21 10/19/2015 0903   ALT 16 02/10/2023 1215   ALT 27 04/25/2016 0844   ALT 19 10/19/2015 0903   BILITOT 0.4 02/10/2023 1215   BILITOT 0.54 10/19/2015 0903      Impression and Plan: Mr. De Burrs is a 72 year old white male.  We treated him 10 years ago.  He had a marginal zone lymphoma.  He went into clinical remission.  When he initially presented, he had splenomegaly.  When he relapsed, he again had splenomegaly.  We subsequently treated him with Imbruvica and venetoclax.  He completed his treatments back in April.  He has completed 6 cycles of Rituxan/Bendamustine.  He had a little bit of a tough time with his protocol.  However, the bone marrow  looks fantastic.  We will just watch him.  We will see how his monoclonal studies look.  Again, I just feel bad that his quality of life is not doing all that great.  We will see how we can try  to affect this.  We will see what the IgG level is.  I will check his TSH.  Will check his iron studies.  He will get Aranesp today.  He does have some mild renal insufficiency.  Maybe, if we get his hemoglobin up with the Aranesp, he may feel better.  I will like to have him come back in about 3 weeks or so.  Again, we need to keep close observation on him.   [name], MD 11/22/20241:08 PM

## 2023-02-12 LAB — IGG, IGA, IGM
IgA: 78 mg/dL (ref 61–437)
IgG (Immunoglobin G), Serum: 430 mg/dL — ABNORMAL LOW (ref 603–1613)
IgM (Immunoglobulin M), Srm: 320 mg/dL — ABNORMAL HIGH (ref 20–172)

## 2023-02-12 LAB — KAPPA/LAMBDA LIGHT CHAINS
Kappa free light chain: 47.4 mg/L — ABNORMAL HIGH (ref 3.3–19.4)
Kappa, lambda light chain ratio: 4.69 — ABNORMAL HIGH (ref 0.26–1.65)
Lambda free light chains: 10.1 mg/L (ref 5.7–26.3)

## 2023-02-20 LAB — PROTEIN ELECTROPHORESIS, SERUM, WITH REFLEX
A/G Ratio: 1.3 (ref 0.7–1.7)
Albumin ELP: 3.5 g/dL (ref 2.9–4.4)
Alpha-1-Globulin: 0.3 g/dL (ref 0.0–0.4)
Alpha-2-Globulin: 1.1 g/dL — ABNORMAL HIGH (ref 0.4–1.0)
Beta Globulin: 0.8 g/dL (ref 0.7–1.3)
Gamma Globulin: 0.5 g/dL (ref 0.4–1.8)
Globulin, Total: 2.7 g/dL (ref 2.2–3.9)
M-Spike, %: 0.3 g/dL — ABNORMAL HIGH
SPEP Interpretation: 0
Total Protein ELP: 6.2 g/dL (ref 6.0–8.5)

## 2023-02-20 LAB — IMMUNOFIXATION REFLEX, SERUM
IgA: 79 mg/dL (ref 61–437)
IgG (Immunoglobin G), Serum: 478 mg/dL — ABNORMAL LOW (ref 603–1613)
IgM (Immunoglobulin M), Srm: 339 mg/dL — ABNORMAL HIGH (ref 20–172)

## 2023-03-02 ENCOUNTER — Inpatient Hospital Stay: Payer: PPO

## 2023-03-02 ENCOUNTER — Inpatient Hospital Stay: Payer: PPO | Attending: Hematology & Oncology

## 2023-03-02 ENCOUNTER — Encounter: Payer: Self-pay | Admitting: Hematology & Oncology

## 2023-03-02 ENCOUNTER — Inpatient Hospital Stay: Payer: PPO | Admitting: Hematology & Oncology

## 2023-03-02 VITALS — BP 137/70 | HR 84 | Temp 98.4°F | Resp 17 | Ht 71.0 in | Wt 196.8 lb

## 2023-03-02 DIAGNOSIS — C8583 Other specified types of non-Hodgkin lymphoma, intra-abdominal lymph nodes: Secondary | ICD-10-CM

## 2023-03-02 DIAGNOSIS — N1831 Chronic kidney disease, stage 3a: Secondary | ICD-10-CM

## 2023-03-02 DIAGNOSIS — E034 Atrophy of thyroid (acquired): Secondary | ICD-10-CM

## 2023-03-02 DIAGNOSIS — E038 Other specified hypothyroidism: Secondary | ICD-10-CM

## 2023-03-02 DIAGNOSIS — D631 Anemia in chronic kidney disease: Secondary | ICD-10-CM

## 2023-03-02 DIAGNOSIS — D801 Nonfamilial hypogammaglobulinemia: Secondary | ICD-10-CM | POA: Insufficient documentation

## 2023-03-02 HISTORY — DX: Nonfamilial hypogammaglobulinemia: D80.1

## 2023-03-02 LAB — CBC WITH DIFFERENTIAL (CANCER CENTER ONLY)
Abs Immature Granulocytes: 0 10*3/uL (ref 0.00–0.07)
Band Neutrophils: 0 %
Basophils Absolute: 0.1 10*3/uL (ref 0.0–0.1)
Basophils Relative: 2 %
Blasts: 0 %
Eosinophils Absolute: 0.1 10*3/uL (ref 0.0–0.5)
Eosinophils Relative: 2 %
HCT: 33.1 % — ABNORMAL LOW (ref 39.0–52.0)
Hemoglobin: 10.9 g/dL — ABNORMAL LOW (ref 13.0–17.0)
Immature Granulocytes: 0 %
Lymphocytes Relative: 29 %
Lymphs Abs: 1 10*3/uL (ref 0.7–4.0)
MCH: 31.6 pg (ref 26.0–34.0)
MCHC: 32.9 g/dL (ref 30.0–36.0)
MCV: 95.9 fL (ref 80.0–100.0)
Metamyelocytes Relative: 0 %
Monocytes Absolute: 0.7 10*3/uL (ref 0.1–1.0)
Monocytes Relative: 22 %
Myelocytes: 0 %
Neutro Abs: 1.4 10*3/uL — ABNORMAL LOW (ref 1.7–7.7)
Neutrophils Relative %: 45 %
Other: 0 %
Platelet Count: 169 10*3/uL (ref 150–400)
Promyelocytes Relative: 0 %
RBC: 3.45 MIL/uL — ABNORMAL LOW (ref 4.22–5.81)
RDW: 13.6 % (ref 11.5–15.5)
Smear Review: NORMAL
WBC Count: 3.3 10*3/uL — ABNORMAL LOW (ref 4.0–10.5)
nRBC: 0 % (ref 0.0–0.2)
nRBC: 0 /100{WBCs}

## 2023-03-02 LAB — CMP (CANCER CENTER ONLY)
ALT: 14 U/L (ref 0–44)
AST: 19 U/L (ref 15–41)
Albumin: 4.1 g/dL (ref 3.5–5.0)
Alkaline Phosphatase: 66 U/L (ref 38–126)
Anion gap: 9 (ref 5–15)
BUN: 22 mg/dL (ref 8–23)
CO2: 28 mmol/L (ref 22–32)
Calcium: 9.5 mg/dL (ref 8.9–10.3)
Chloride: 99 mmol/L (ref 98–111)
Creatinine: 1.53 mg/dL — ABNORMAL HIGH (ref 0.61–1.24)
GFR, Estimated: 49 mL/min — ABNORMAL LOW (ref >60)
Glucose, Bld: 119 mg/dL — ABNORMAL HIGH (ref 70–99)
Potassium: 3.8 mmol/L (ref 3.5–5.1)
Sodium: 136 mmol/L (ref 135–145)
Total Bilirubin: 0.5 mg/dL (ref <1.2)
Total Protein: 7 g/dL (ref 6.5–8.1)

## 2023-03-02 LAB — FERRITIN: Ferritin: 251 ng/mL (ref 24–336)

## 2023-03-02 LAB — LACTATE DEHYDROGENASE: LDH: 218 U/L — ABNORMAL HIGH (ref 98–192)

## 2023-03-02 MED ORDER — SODIUM CHLORIDE 0.9% FLUSH
10.0000 mL | INTRAVENOUS | Status: DC | PRN
  Administered 2023-03-02: 10 mL via INTRAVENOUS

## 2023-03-02 MED ORDER — DARBEPOETIN ALFA 300 MCG/0.6ML IJ SOSY
300.0000 ug | PREFILLED_SYRINGE | Freq: Once | INTRAMUSCULAR | Status: AC
  Administered 2023-03-02: 300 ug via SUBCUTANEOUS
  Filled 2023-03-02: qty 0.6

## 2023-03-02 MED ORDER — HEPARIN SOD (PORK) LOCK FLUSH 100 UNIT/ML IV SOLN
500.0000 [IU] | Freq: Once | INTRAVENOUS | Status: AC
  Administered 2023-03-02: 500 [IU] via INTRAVENOUS

## 2023-03-02 NOTE — Patient Instructions (Signed)

## 2023-03-02 NOTE — Progress Notes (Signed)
Hematology and Oncology Follow Up Visit  [name] [name] [id number] 1950-10-26 72 y.o. 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  Unfortunately, he still not feeling all that great.  He still gets tired quite easily.  He does get some shortness of breath.  He is not having fevers.  I really think we are to have to consider IVIG for him.  His IgG level was only 400 mg/dL.  This is on the low side.  This is all from his treatments that he took for the marginal zone lymphoma.  I know that he has had COVID in the past.  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  However, I just want his quality life to feel better.  His appetite is all right.  He has had no nausea or vomiting.  He has had no diarrhea.  He does have some swelling in the legs.  He does have some chronic renal insufficiency.  There is been no problems with bleeding.  He has had no headache.  He has had no mouth sores.  There is been a little cough.  As far as monoclonal studies show, his last M spike was 0.1 g/dL.  His IgM level was 330 mg/dL.  The Kappa light chain was 4.7 mg/dL.  Overall, I would have to say that his performance status is probably ECOG 1.      Medications:  Current Outpatient Medications:    furosemide (LASIX) 40 MG tablet, Take 40 mg by mouth daily as needed., Disp: , Rfl:    hydrALAZINE (APRESOLINE) 25 MG tablet, TAKE 1 TABLET BY MOUTH THREE TIMES A DAY,  Disp: 270 tablet, Rfl: 1   levothyroxine (SYNTHROID) 137 MCG tablet, Take 1 tablet (137 mcg total) by mouth daily before breakfast., Disp: 90 tablet, Rfl: 3   melatonin 5 MG TABS, Take 5 mg by mouth at bedtime as needed., Disp: , Rfl:    Multiple Vitamins-Minerals (CENTRUM SILVER 50+MEN PO), Take by mouth., Disp: , Rfl:    rosuvastatin (CRESTOR) 20 MG tablet, TAKE 1 TABLET BY MOUTH EVERY DAY, Disp: 90 tablet, Rfl: 1   venlafaxine XR (EFFEXOR-XR) 37.5 MG 24 hr capsule, TAKE 1 CAPSULE BY MOUTH DAILY WITH BREAKFAST., Disp: 90 capsule, Rfl: 2   Ipratropium-Albuterol (COMBIVENT) 20-100 MCG/ACT AERS respimat, Inhale 1 puff into the lungs every 6 (six) hours. (Patient not taking: Reported on 03/02/2023), Disp: 1 each, Rfl: 4 No current facility-administered medications for this visit.  Facility-Administered Medications Ordered in Other Visits:    sodium chloride flush (NS) 0.9 % injection 10 mL, 10 mL, Intravenous, PRN, [name], MD, 10 mL at 03/02/23 1503  Allergies: No Known Allergies  Past Medical History, Surgical history, Social history, and Family History were reviewed and updated.  Review of Systems: Review of Systems  Constitutional: Negative.   HENT:  Negative.    Eyes: Negative.   Respiratory: Negative.    Cardiovascular: Negative.  Gastrointestinal:  Positive for abdominal pain and nausea.  Endocrine: Negative.   Genitourinary: Negative.    Musculoskeletal: Negative.   Skin: Negative.   Neurological: Negative.   Hematological: Negative.   Psychiatric/Behavioral: Negative.      Physical Exam: Vital signs are temperature of 98.4.  Pulse 84.  Blood pressure 137/70.  Weight is 196 pounds.     Wt Readings from Last 3 Encounters:  03/02/23 196 lb 12.8 oz (89.3 kg)  02/10/23 195 lb (88.5 kg)  12/09/22 203 lb 12.8 oz (92.4 kg)    Physical Exam Vitals reviewed.  HENT:     Head: Normocephalic and atraumatic.  Eyes:     Pupils: Pupils are equal, round, and reactive  to light.  Cardiovascular:     Rate and Rhythm: Normal rate and regular rhythm.     Heart sounds: Normal heart sounds.  Pulmonary:     Effort: Pulmonary effort is normal.     Breath sounds: Normal breath sounds.  Abdominal:     General: Bowel sounds are normal.     Palpations: Abdomen is soft.  Musculoskeletal:        General: No tenderness or deformity. Normal range of motion.     Cervical back: Normal range of motion.  Lymphadenopathy:     Cervical: No cervical adenopathy.  Skin:    General: Skin is warm and dry.     Findings: No erythema or rash.  Neurological:     Mental Status: He is alert and oriented to person, place, and time.  Psychiatric:        Behavior: Behavior normal.        Thought Content: Thought content normal.        Judgment: Judgment normal.    Lab Results  Component Value Date   WBC 3.3 (L) 03/02/2023   HGB 10.9 (L) 03/02/2023   HCT 33.1 (L) 03/02/2023   MCV 95.9 03/02/2023   PLT 169 03/02/2023     Chemistry      Component Value Date/Time   NA 137 02/10/2023 1215   NA 138 12/06/2019 1501   NA 142 04/25/2016 0844   NA 140 10/19/2015 0903   K 4.1 02/10/2023 1215   K 4.1 04/25/2016 0844   K 4.1 10/19/2015 0903   CL 100 02/10/2023 1215   CL 102 04/25/2016 0844   CO2 29 02/10/2023 1215   CO2 29 04/25/2016 0844   CO2 25 10/19/2015 0903   BUN 24 (H) 02/10/2023 1215   BUN 20 12/06/2019 1501   BUN 14 04/25/2016 0844   BUN 21.6 10/19/2015 0903   CREATININE 1.39 (H) 02/10/2023 1215   CREATININE 1.1 04/25/2016 0844   CREATININE 1.3 10/19/2015 0903      Component Value Date/Time   CALCIUM 9.6 02/10/2023 1215   CALCIUM 9.6 04/25/2016 0844   CALCIUM 9.4 10/19/2015 0903   ALKPHOS 58 02/10/2023 1215   ALKPHOS 59 04/25/2016 0844   ALKPHOS 68 10/19/2015 0903   AST 20 02/10/2023 1215   AST 21 10/19/2015 0903   ALT 16 02/10/2023 1215   ALT 27 04/25/2016 0844   ALT 19 10/19/2015 0903   BILITOT 0.4 02/10/2023 1215   BILITOT 0.54 10/19/2015 0903       Impression and Plan: [name] [name] is a 72 year old white male.  We treated him 10 years ago.  He had a marginal zone lymphoma.  He went into clinical remission.  When he initially presented, he had splenomegaly.  When he relapsed, he  again had splenomegaly.  We subsequently treated him with Imbruvica and venetoclax.  He completed his treatments back in April.  He has completed 6 cycles of Rituxan/Bendamustine.    Again, I just feel bad that his quality of life is not doing all that great.  Again, we will now try more IVIG.  He is getting Aranesp.  We will see what his iron studies look like.  Will see about getting him in next week for the IVIG.  I will plan to see him back after the Holiday season.   [name], MD 12/12/20243:53 PM

## 2023-03-02 NOTE — Patient Instructions (Signed)

## 2023-03-03 ENCOUNTER — Encounter: Payer: Self-pay | Admitting: Hematology & Oncology

## 2023-03-03 ENCOUNTER — Telehealth: Payer: Self-pay

## 2023-03-03 LAB — IRON AND IRON BINDING CAPACITY (CC-WL,HP ONLY)
Iron: 27 ug/dL — ABNORMAL LOW (ref 45–182)
Saturation Ratios: 9 % — ABNORMAL LOW (ref 17.9–39.5)
TIBC: 314 ug/dL (ref 250–450)
UIBC: 287 ug/dL (ref 117–376)

## 2023-03-03 LAB — IGG, IGA, IGM
IgA: 72 mg/dL (ref 61–437)
IgG (Immunoglobin G), Serum: 437 mg/dL — ABNORMAL LOW (ref 603–1613)
IgM (Immunoglobulin M), Srm: 309 mg/dL — ABNORMAL HIGH (ref 20–172)

## 2023-03-03 NOTE — Addendum Note (Signed)
Addended by: Arlan Organ R on: 03/03/2023 04:46 PM   Modules accepted: Orders

## 2023-03-03 NOTE — Telephone Encounter (Signed)
Advised via MyChart.

## 2023-03-03 NOTE — Telephone Encounter (Signed)
-----   Message from [name] sent at 03/03/2023 12:15 PM EST ----- Please call let him know that the iron is incredibly low.  This might be part of why he is tired.  Please set him up with IV iron.  Please have him come in for the IV iron when he gets his IVIG.  Thanks.  [name]

## 2023-03-04 LAB — KAPPA/LAMBDA LIGHT CHAINS
Kappa free light chain: 50.1 mg/L — ABNORMAL HIGH (ref 3.3–19.4)
Kappa, lambda light chain ratio: 2.26 — ABNORMAL HIGH (ref 0.26–1.65)
Lambda free light chains: 22.2 mg/L (ref 5.7–26.3)

## 2023-03-09 ENCOUNTER — Ambulatory Visit (HOSPITAL_BASED_OUTPATIENT_CLINIC_OR_DEPARTMENT_OTHER)
Admission: RE | Admit: 2023-03-09 | Discharge: 2023-03-09 | Disposition: A | Discharge status: Home / Self Care | Payer: PPO | Source: Ambulatory Visit | Attending: Hematology & Oncology | Admitting: Hematology & Oncology

## 2023-03-09 ENCOUNTER — Inpatient Hospital Stay: Payer: PPO

## 2023-03-09 ENCOUNTER — Other Ambulatory Visit: Payer: Self-pay

## 2023-03-09 VITALS — BP 142/66 | HR 86 | Temp 98.6°F | Resp 20

## 2023-03-09 DIAGNOSIS — R058 Other specified cough: Secondary | ICD-10-CM | POA: Diagnosis not present

## 2023-03-09 DIAGNOSIS — C8583 Other specified types of non-Hodgkin lymphoma, intra-abdominal lymph nodes: Secondary | ICD-10-CM

## 2023-03-09 DIAGNOSIS — R0602 Shortness of breath: Secondary | ICD-10-CM | POA: Diagnosis not present

## 2023-03-09 DIAGNOSIS — C8590 Non-Hodgkin lymphoma, unspecified, unspecified site: Secondary | ICD-10-CM | POA: Diagnosis not present

## 2023-03-09 DIAGNOSIS — D801 Nonfamilial hypogammaglobulinemia: Secondary | ICD-10-CM | POA: Diagnosis not present

## 2023-03-09 MED ORDER — LORATADINE 10 MG PO TABS
10.0000 mg | ORAL_TABLET | Freq: Once | ORAL | Status: AC
  Administered 2023-03-09: 10 mg via ORAL
  Filled 2023-03-09: qty 1

## 2023-03-09 MED ORDER — METHYLPREDNISOLONE SODIUM SUCC 125 MG IJ SOLR
80.0000 mg | Freq: Once | INTRAMUSCULAR | Status: AC
Start: 2023-03-09 — End: 2023-03-09
  Administered 2023-03-09: 80 mg via INTRAVENOUS
  Filled 2023-03-09: qty 2

## 2023-03-09 MED ORDER — ACETAMINOPHEN 325 MG PO TABS
650.0000 mg | ORAL_TABLET | Freq: Once | ORAL | Status: DC
Start: 2023-03-09 — End: 2023-03-09
  Filled 2023-03-09: qty 2

## 2023-03-09 MED ORDER — HEPARIN SOD (PORK) LOCK FLUSH 100 UNIT/ML IV SOLN
500.0000 [IU] | Freq: Once | INTRAVENOUS | Status: AC
  Administered 2023-03-09: 500 [IU] via INTRAVENOUS

## 2023-03-09 MED ORDER — DEXTROSE 5 % IV SOLN
INTRAVENOUS | Status: DC
Start: 2023-03-09 — End: 2023-03-09

## 2023-03-09 MED ORDER — ACETAMINOPHEN 325 MG PO TABS
650.0000 mg | ORAL_TABLET | Freq: Once | ORAL | Status: AC
Start: 2023-03-09 — End: 2023-03-09
  Administered 2023-03-09: 650 mg via ORAL

## 2023-03-09 MED ORDER — DIPHENHYDRAMINE HCL 25 MG PO CAPS
25.0000 mg | ORAL_CAPSULE | Freq: Once | ORAL | Status: AC
  Administered 2023-03-09: 25 mg via ORAL
  Filled 2023-03-09: qty 1

## 2023-03-09 MED ORDER — CEFDINIR 300 MG PO CAPS: 600.0000 mg | ORAL_CAPSULE | Freq: Every day | ORAL | 0 refills | Status: DC

## 2023-03-09 MED ORDER — IMMUNE GLOBULIN (HUMAN) 10 GM/100ML IV SOLN
40.0000 g | Freq: Once | INTRAVENOUS | Status: AC
  Administered 2023-03-09: 40 g via INTRAVENOUS
  Filled 2023-03-09: qty 400

## 2023-03-09 MED ORDER — DEXTROSE 5 % IV SOLN
2.0000 g | Freq: Once | INTRAVENOUS | Status: AC
  Administered 2023-03-09: 2 g via INTRAVENOUS
  Filled 2023-03-09: qty 20

## 2023-03-09 MED ORDER — SODIUM CHLORIDE 0.9 % IV SOLN
INTRAVENOUS | Status: DC
Start: 2023-03-09 — End: 2023-03-09

## 2023-03-09 MED ORDER — SODIUM CHLORIDE 0.9% FLUSH: 10.0000 mL | INTRAVENOUS | Status: DC | PRN

## 2023-03-09 MED ORDER — SODIUM CHLORIDE 0.9% FLUSH
10.0000 mL | INTRAVENOUS | Status: DC | PRN
  Administered 2023-03-09: 10 mL via INTRAVENOUS

## 2023-03-09 MED ORDER — SODIUM CHLORIDE 0.9 % IV SOLN
1000.0000 mg | Freq: Once | INTRAVENOUS | Status: AC
  Administered 2023-03-09: 1000 mg via INTRAVENOUS
  Filled 2023-03-09: qty 10

## 2023-03-09 NOTE — Patient Instructions (Signed)

## 2023-03-14 LAB — PROTEIN ELECTROPHORESIS, SERUM, WITH REFLEX
A/G Ratio: 1.3 (ref 0.7–1.7)
Albumin ELP: 3.6 g/dL (ref 2.9–4.4)
Alpha-1-Globulin: 0.3 g/dL (ref 0.0–0.4)
Alpha-2-Globulin: 1.1 g/dL — ABNORMAL HIGH (ref 0.4–1.0)
Beta Globulin: 0.8 g/dL (ref 0.7–1.3)
Gamma Globulin: 0.6 g/dL (ref 0.4–1.8)
Globulin, Total: 2.8 g/dL (ref 2.2–3.9)
M-Spike, %: 0.3 g/dL — ABNORMAL HIGH
SPEP Interpretation: 0
Total Protein ELP: 6.4 g/dL (ref 6.0–8.5)

## 2023-03-14 LAB — IMMUNOFIXATION REFLEX, SERUM

## 2023-03-26 ENCOUNTER — Other Ambulatory Visit: Payer: Self-pay | Admitting: Internal Medicine

## 2023-03-30 ENCOUNTER — Encounter: Payer: Self-pay | Admitting: Hematology & Oncology

## 2023-03-30 ENCOUNTER — Telehealth: Payer: Self-pay

## 2023-03-30 ENCOUNTER — Inpatient Hospital Stay (HOSPITAL_BASED_OUTPATIENT_CLINIC_OR_DEPARTMENT_OTHER): Payer: PPO | Admitting: Hematology & Oncology

## 2023-03-30 ENCOUNTER — Inpatient Hospital Stay: Payer: PPO

## 2023-03-30 ENCOUNTER — Inpatient Hospital Stay: Payer: PPO | Attending: Hematology & Oncology

## 2023-03-30 VITALS — BP 139/80 | HR 66 | Temp 98.2°F | Resp 18

## 2023-03-30 VITALS — BP 126/68 | HR 96 | Temp 98.2°F | Resp 18 | Ht 71.0 in | Wt 200.0 lb

## 2023-03-30 DIAGNOSIS — D801 Nonfamilial hypogammaglobulinemia: Secondary | ICD-10-CM | POA: Diagnosis not present

## 2023-03-30 DIAGNOSIS — N1831 Chronic kidney disease, stage 3a: Secondary | ICD-10-CM

## 2023-03-30 DIAGNOSIS — C8583 Other specified types of non-Hodgkin lymphoma, intra-abdominal lymph nodes: Secondary | ICD-10-CM

## 2023-03-30 DIAGNOSIS — E038 Other specified hypothyroidism: Secondary | ICD-10-CM

## 2023-03-30 DIAGNOSIS — Z79899 Other long term (current) drug therapy: Secondary | ICD-10-CM | POA: Diagnosis not present

## 2023-03-30 DIAGNOSIS — E030 Congenital hypothyroidism with diffuse goiter: Secondary | ICD-10-CM | POA: Diagnosis not present

## 2023-03-30 DIAGNOSIS — D509 Iron deficiency anemia, unspecified: Secondary | ICD-10-CM | POA: Insufficient documentation

## 2023-03-30 DIAGNOSIS — D631 Anemia in chronic kidney disease: Secondary | ICD-10-CM

## 2023-03-30 LAB — CBC WITH DIFFERENTIAL (CANCER CENTER ONLY)
Abs Immature Granulocytes: 0.04 10*3/uL (ref 0.00–0.07)
Basophils Absolute: 0 10*3/uL (ref 0.0–0.1)
Basophils Relative: 1 %
Eosinophils Absolute: 0.1 10*3/uL (ref 0.0–0.5)
Eosinophils Relative: 2 %
HCT: 36.2 % — ABNORMAL LOW (ref 39.0–52.0)
Hemoglobin: 12.1 g/dL — ABNORMAL LOW (ref 13.0–17.0)
Immature Granulocytes: 1 %
Lymphocytes Relative: 25 %
Lymphs Abs: 1.2 10*3/uL (ref 0.7–4.0)
MCH: 32 pg (ref 26.0–34.0)
MCHC: 33.4 g/dL (ref 30.0–36.0)
MCV: 95.8 fL (ref 80.0–100.0)
Monocytes Absolute: 0.7 10*3/uL (ref 0.1–1.0)
Monocytes Relative: 15 %
Neutro Abs: 2.7 10*3/uL (ref 1.7–7.7)
Neutrophils Relative %: 56 %
Platelet Count: 141 10*3/uL — ABNORMAL LOW (ref 150–400)
RBC: 3.78 MIL/uL — ABNORMAL LOW (ref 4.22–5.81)
RDW: 15.2 % (ref 11.5–15.5)
WBC Count: 4.9 10*3/uL (ref 4.0–10.5)
nRBC: 0 % (ref 0.0–0.2)

## 2023-03-30 LAB — IRON AND IRON BINDING CAPACITY (CC-WL,HP ONLY)
Iron: 129 ug/dL (ref 45–182)
Saturation Ratios: 47 % — ABNORMAL HIGH (ref 17.9–39.5)
TIBC: 273 ug/dL (ref 250–450)
UIBC: 144 ug/dL (ref 117–376)

## 2023-03-30 LAB — CMP (CANCER CENTER ONLY)
ALT: 14 U/L (ref 0–44)
AST: 18 U/L (ref 15–41)
Albumin: 4.2 g/dL (ref 3.5–5.0)
Alkaline Phosphatase: 59 U/L (ref 38–126)
Anion gap: 9 (ref 5–15)
BUN: 26 mg/dL — ABNORMAL HIGH (ref 8–23)
CO2: 27 mmol/L (ref 22–32)
Calcium: 9.5 mg/dL (ref 8.9–10.3)
Chloride: 103 mmol/L (ref 98–111)
Creatinine: 1.54 mg/dL — ABNORMAL HIGH (ref 0.61–1.24)
GFR, Estimated: 48 mL/min — ABNORMAL LOW (ref >60)
Glucose, Bld: 113 mg/dL — ABNORMAL HIGH (ref 70–99)
Potassium: 3.8 mmol/L (ref 3.5–5.1)
Sodium: 139 mmol/L (ref 135–145)
Total Bilirubin: 0.5 mg/dL (ref 0.0–1.2)
Total Protein: 6.9 g/dL (ref 6.5–8.1)

## 2023-03-30 LAB — LACTATE DEHYDROGENASE: LDH: 150 U/L (ref 98–192)

## 2023-03-30 LAB — FERRITIN: Ferritin: 684 ng/mL — ABNORMAL HIGH (ref 24–336)

## 2023-03-30 MED ORDER — HEPARIN SOD (PORK) LOCK FLUSH 100 UNIT/ML IV SOLN
500.0000 [IU] | Freq: Once | INTRAVENOUS | Status: AC | PRN
Start: 2023-03-30 — End: 2023-03-30
  Administered 2023-03-30: 500 [IU]

## 2023-03-30 MED ORDER — SODIUM CHLORIDE 0.9% FLUSH
10.0000 mL | Freq: Once | INTRAVENOUS | Status: AC | PRN
Start: 2023-03-30 — End: 2023-03-30
  Administered 2023-03-30: 10 mL

## 2023-03-30 MED ORDER — ACETAMINOPHEN 325 MG PO TABS
325.0000 mg | ORAL_TABLET | Freq: Once | ORAL | Status: AC
  Administered 2023-03-30: 325 mg via ORAL
  Filled 2023-03-30: qty 1

## 2023-03-30 MED ORDER — FAMOTIDINE IN NACL 20-0.9 MG/50ML-% IV SOLN
20.0000 mg | INTRAVENOUS | Status: AC
  Administered 2023-03-30 (×2): 20 mg via INTRAVENOUS
  Filled 2023-03-30: qty 50

## 2023-03-30 MED ORDER — IMMUNE GLOBULIN (HUMAN) 40 GM/400ML IV SOLN
40.0000 g | Freq: Once | INTRAVENOUS | Status: AC
  Administered 2023-03-30: 40 g via INTRAVENOUS
  Filled 2023-03-30: qty 400

## 2023-03-30 MED ORDER — DEXTROSE 5 % IV SOLN
INTRAVENOUS | Status: DC
Start: 2023-03-30 — End: 2023-03-30

## 2023-03-30 NOTE — Progress Notes (Signed)
 12 Hematology and Oncology Follow Up Visit  [name] [name] [id number] May 29, 1950 73 y.o. 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 is back for follow-up.  Finally, he started to feel better.  Somehow, have to believe that we found that his iron was incredibly low, this was the cause for him not to feel well.  His iron saturation was only 9%.  We did go ahead and give him a dose of IV iron.  Today, his hemoglobin is up to 12.1.  I am very impressed by this.  He also is on IVIG now.  I think this also has helped him feel a little bit better as his immune system is improved.  His white cell count is also come up quite nicely.  Again I think this also has helped his immune system.  Thankfully, he has had no problems with COVID over the holidays.  He has had no issues with nausea or vomiting.  His kidney function is about the same.  He does not have as much swelling in the legs.  His last monoclonal studies showed M spike 0.3 g/dL.  His IgM level was 300 mg/dL.  The Kappa light chain was 5 mg/dL.  Overall, I would say his performance status is probably ECOG 0.     Medications:  Current Outpatient Medications:    furosemide (LASIX) 40 MG tablet, Take 40 mg by mouth daily as needed., Disp: , Rfl:    hydrALAZINE  (APRESOLINE ) 25 MG tablet, TAKE 1 TABLET BY MOUTH THREE TIMES A DAY, Disp: 270 tablet,  Rfl: 1   levothyroxine  (SYNTHROID ) 137 MCG tablet, Take 1 tablet (137 mcg total) by mouth daily before breakfast., Disp: 90 tablet, Rfl: 3   Multiple Vitamins-Minerals (CENTRUM SILVER 50+MEN PO), Take by mouth daily., Disp: , Rfl:    rosuvastatin  (CRESTOR ) 20 MG tablet, TAKE 1 TABLET BY MOUTH EVERY DAY, Disp: 90 tablet, Rfl: 1   venlafaxine  XR (EFFEXOR -XR) 37.5 MG 24 hr capsule, TAKE 1 CAPSULE BY MOUTH DAILY WITH BREAKFAST., Disp: 90 capsule, Rfl: 2   Ipratropium-Albuterol  (COMBIVENT) 20-100 MCG/ACT AERS respimat, Inhale 1 puff into the lungs every 6 (six) hours. (Patient not taking: Reported on 03/30/2023), Disp: 1 each, Rfl: 4   melatonin 5 MG TABS, Take 5 mg by mouth at bedtime as needed. (Patient not taking: Reported on 03/30/2023), Disp: , Rfl:   Allergies: No Known Allergies  Past Medical History, Surgical history, Social history, and Family History were reviewed and updated.  Review of Systems: Review of Systems  Constitutional: Negative.   HENT:  Negative.    Eyes: Negative.   Respiratory: Negative.    Cardiovascular: Negative.   Gastrointestinal:  Positive for abdominal pain and nausea.  Endocrine: Negative.   Genitourinary: Negative.    Musculoskeletal: Negative.   Skin: Negative.   Neurological: Negative.   Hematological: Negative.   Psychiatric/Behavioral: Negative.  Physical Exam: Vital signs are temperature of 98.2.  Pulse 96.  Blood pressure 126/68.  Weight is 200 pounds.      Wt Readings from Last 3 Encounters:  03/30/23 200 lb (90.7 kg)  03/02/23 196 lb 12.8 oz (89.3 kg)  02/10/23 195 lb (88.5 kg)    Physical Exam Vitals reviewed.  HENT:     Head: Normocephalic and atraumatic.  Eyes:     Pupils: Pupils are equal, round, and reactive to light.  Cardiovascular:     Rate and Rhythm: Normal rate and regular rhythm.     Heart sounds: Normal heart sounds.  Pulmonary:     Effort: Pulmonary effort is normal.     Breath sounds: Normal breath sounds.   Abdominal:     General: Bowel sounds are normal.     Palpations: Abdomen is soft.  Musculoskeletal:        General: No tenderness or deformity. Normal range of motion.     Cervical back: Normal range of motion.  Lymphadenopathy:     Cervical: No cervical adenopathy.  Skin:    General: Skin is warm and dry.     Findings: No erythema or rash.  Neurological:     Mental Status: He is alert and oriented to person, place, and time.  Psychiatric:        Behavior: Behavior normal.        Thought Content: Thought content normal.        Judgment: Judgment normal.    Lab Results  Component Value Date   WBC 4.9 03/30/2023   HGB 12.1 (L) 03/30/2023   HCT 36.2 (L) 03/30/2023   MCV 95.8 03/30/2023   PLT 141 (L) 03/30/2023     Chemistry      Component Value Date/Time   NA 136 03/02/2023 1510   NA 138 12/06/2019 1501   NA 142 04/25/2016 0844   NA 140 10/19/2015 0903   K 3.8 03/02/2023 1510   K 4.1 04/25/2016 0844   K 4.1 10/19/2015 0903   CL 99 03/02/2023 1510   CL 102 04/25/2016 0844   CO2 28 03/02/2023 1510   CO2 29 04/25/2016 0844   CO2 25 10/19/2015 0903   BUN 22 03/02/2023 1510   BUN 20 12/06/2019 1501   BUN 14 04/25/2016 0844   BUN 21.6 10/19/2015 0903   CREATININE 1.53 (H) 03/02/2023 1510   CREATININE 1.1 04/25/2016 0844   CREATININE 1.3 10/19/2015 0903      Component Value Date/Time   CALCIUM  9.5 03/02/2023 1510   CALCIUM  9.6 04/25/2016 0844   CALCIUM  9.4 10/19/2015 0903   ALKPHOS 66 03/02/2023 1510   ALKPHOS 59 04/25/2016 0844   ALKPHOS 68 10/19/2015 0903   AST 19 03/02/2023 1510   AST 21 10/19/2015 0903   ALT 14 03/02/2023 1510   ALT 27 04/25/2016 0844   ALT 19 10/19/2015 0903   BILITOT 0.5 03/02/2023 1510   BILITOT 0.54 10/19/2015 0903      Impression and Plan: [name] is a 73 year old white male.  We treated him 10 years ago.  He had a marginal zone lymphoma.  He went into clinical remission.  When he initially presented, he had splenomegaly.   When he relapsed, he again had splenomegaly.  We subsequently treated him with Imbruvica  and venetoclax .  He completed his treatments back in April.  He has completed 6 cycles of Rituxan /Bendamustine .  He completed this back in March 2024.  I am grateful that his quality  of life is better now.  I am so happy that he feels better.  Again, I think the iron really helped him out.  He does not need any Aranesp  today.  He will get the IVIG.  Will have him come back in another month.  We will monitor his IgG level.   Maude JONELLE Crease, MD 1/9/20258:44 AM

## 2023-03-30 NOTE — Telephone Encounter (Signed)
-----   Message from [name] sent at 03/30/2023  2:12 PM EST ----- Please call and let him know that the iron level is much better now.  [name]

## 2023-03-30 NOTE — Patient Instructions (Signed)

## 2023-03-30 NOTE — Patient Instructions (Signed)

## 2023-03-30 NOTE — Telephone Encounter (Signed)
 Advised via MyChart.

## 2023-03-31 LAB — IGG, IGA, IGM
IgA: 64 mg/dL (ref 61–437)
IgG (Immunoglobin G), Serum: 767 mg/dL (ref 603–1613)
IgM (Immunoglobulin M), Srm: 324 mg/dL — ABNORMAL HIGH (ref 20–172)

## 2023-03-31 LAB — KAPPA/LAMBDA LIGHT CHAINS
Kappa free light chain: 43.9 mg/L — ABNORMAL HIGH (ref 3.3–19.4)
Kappa, lambda light chain ratio: 5.23 — ABNORMAL HIGH (ref 0.26–1.65)
Lambda free light chains: 8.4 mg/L (ref 5.7–26.3)

## 2023-04-07 LAB — PROTEIN ELECTROPHORESIS, SERUM, WITH REFLEX
A/G Ratio: 1.3 (ref 0.7–1.7)
Albumin ELP: 3.6 g/dL (ref 2.9–4.4)
Alpha-1-Globulin: 0.3 g/dL (ref 0.0–0.4)
Alpha-2-Globulin: 1 g/dL (ref 0.4–1.0)
Beta Globulin: 0.7 g/dL (ref 0.7–1.3)
Gamma Globulin: 0.9 g/dL (ref 0.4–1.8)
Globulin, Total: 2.8 g/dL (ref 2.2–3.9)
M-Spike, %: 0.4 g/dL — ABNORMAL HIGH
SPEP Interpretation: 0
Total Protein ELP: 6.4 g/dL (ref 6.0–8.5)

## 2023-04-07 LAB — IMMUNOFIXATION REFLEX, SERUM
IgA: 84 mg/dL (ref 61–437)
IgG (Immunoglobin G), Serum: 875 mg/dL (ref 603–1613)
IgM (Immunoglobulin M), Srm: 382 mg/dL — ABNORMAL HIGH (ref 20–172)

## 2023-04-17 ENCOUNTER — Other Ambulatory Visit: Payer: Self-pay | Admitting: Internal Medicine

## 2023-04-18 ENCOUNTER — Encounter: Payer: Self-pay | Admitting: Internal Medicine

## 2023-04-18 ENCOUNTER — Other Ambulatory Visit: Payer: Self-pay | Admitting: Internal Medicine

## 2023-04-23 ENCOUNTER — Encounter: Payer: Self-pay | Admitting: Internal Medicine

## 2023-04-23 DIAGNOSIS — N183 Chronic kidney disease, stage 3 unspecified: Secondary | ICD-10-CM | POA: Insufficient documentation

## 2023-04-23 NOTE — Progress Notes (Unsigned)
Subjective:    Patient ID: [name] [name], male    DOB: 02-Sep-1950, 73 y.o.   MRN: [id number]     HPI [name] [name] is here for a physical exam and his chronic medical problems.   Could do better with water intake.   Has been feeling a little bit better-getting infusion and Aranesp through heme-on[facility] and that seems to be helping his energy level.   Medications and allergies reviewed with patient and updated if appropriate.  Current Outpatient Medications on File Prior to Visit  Medication Sig Dispense Refill   furosemide (LASIX) 40 MG tablet Take 40 mg by mouth daily as needed.     hydrALAZINE (APRESOLINE) 25 MG tablet TAKE 1 TABLET BY MOUTH THREE TIMES A DAY 270 tablet 1   levothyroxine (SYNTHROID) 137 MCG tablet TAKE 1 TABLET BY MOUTH DAILY BEFORE BREAKFAST. 90 tablet 0   Multiple Vitamins-Minerals (CENTRUM SILVER 50+MEN PO) Take by mouth daily.     rosuvastatin (CRESTOR) 20 MG tablet TAKE 1 TABLET BY MOUTH EVERY DAY 90 tablet 1   venlafaxine XR (EFFEXOR-XR) 37.5 MG 24 hr capsule TAKE 1 CAPSULE BY MOUTH DAILY WITH BREAKFAST. 90 capsule 2   Ipratropium-Albuterol (COMBIVENT) 20-100 MCG/ACT AERS respimat Inhale 1 puff into the lungs every 6 (six) hours. (Patient not taking: Reported on 03/02/2023) 1 each 4   No current facility-administered medications on file prior to visit.    Review of Systems  Constitutional:  Positive for fatigue. Negative for fever.  Eyes:  Negative for visual disturbance.  Respiratory:  Positive for shortness of breath (with moderate exertion). Negative for cough and wheezing.   Cardiovascular:  Positive for leg swelling (if on his feet a lot). Negative for chest pain and palpitations.  Gastrointestinal:  Negative for abdominal pain, blood in stool, constipation and diarrhea.       Occ gerd  Genitourinary:  Positive for frequency. Negative for difficulty urinating, dysuria and hematuria.  Musculoskeletal:  Negative for arthralgias (stiffness in hands) and  back pain.  Skin:  Negative for rash.  Neurological:  Negative for light-headedness and headaches.  Psychiatric/Behavioral:  Negative for dysphoric mood. The patient is not nervous/anxious.        Objective:   Vitals:   04/24/23 1422  BP: 108/70  Pulse: 77  Temp: 98.4 F (36.9 C)  SpO2: 92%   Filed Weights   04/24/23 1422  Weight: 203 lb (92.1 kg)   Body mass index is 28.31 kg/m.  BP Readings from Last 3 Encounters:  04/24/23 108/70  03/30/23 139/80  03/30/23 126/68    Wt Readings from Last 3 Encounters:  04/24/23 203 lb (92.1 kg)  03/30/23 200 lb (90.7 kg)  03/02/23 196 lb 12.8 oz (89.3 kg)      Physical Exam Constitutional: He appears well-developed and well-nourished. No distress.  HENT:  Head: Normocephalic and atraumatic.  Right Ear: External ear normal.  Left Ear: External ear normal.  Normal ear canals and TM b/l  Mouth/Throat: Oropharynx is clear and moist. Eyes: Conjunctivae and EOM are normal.  Neck: Neck supple. No tracheal deviation present. No thyromegaly present.  No carotid bruit  Cardiovascular: Normal rate, regular rhythm, normal heart sounds and intact distal pulses.   No murmur heard.  No lower extremity edema. Pulmonary/Chest: Effort normal and breath sounds normal. No respiratory distress. He has no wheezes. He has no rales.  Abdominal: Soft. He exhibits no distension. There is no tenderness.  Genitourinary: deferred  Lymphadenopathy:   He has  no cervical adenopathy.  Skin: Skin is warm and dry. He is not diaphoretic.  Psychiatric: He has a normal mood and affect. His behavior is normal.         Assessment & Plan:   Physical exam: Screening blood work  ordered Exercise   not much due to fatigue - energy increasing  -will try to start walking Weight  is good Substance abuse   none   Reviewed recommended immunizations.   Health Maintenance  Topic Date Due   Medicare Annual Wellness (AWV)  12/04/2021   INFLUENZA VACCINE   10/20/2022   COVID-19 Vaccine (7 - 2024-25 season) 11/20/2022   Colonoscopy  11/23/2022   DTaP/Tdap/Td (2 - Td or Tdap) 04/23/2024 (Originally 09/06/2020)   Pneumonia Vaccine 54+ Years old  Completed   Hepatitis C Screening  Completed   Zoster Vaccines- Shingrix  Completed   HPV VACCINES  Aged Out     See Problem List for Assessment and Plan of chronic medical problems.

## 2023-04-23 NOTE — Progress Notes (Signed)
 Cardiology Office Note   Date:  04/26/2023   ID:  [name], [name] October 03, 1950, MRN [id number]  PCP:  Geofm Glade PARAS, MD  Cardiologist:   Whyatt Klinger, MD   Chief Complaint  Patient presents with   Coronary Artery Disease       History of Present Illness: [name] [name] is a 73 y.o. male who is seen for follow up CAD/coronary calcification.  He has a history of HLD and hypothyroidism. 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  Treated with Imbruvica .  CT in February 2021 showed calcification in the left main coronary artery.    He retired from the [employer] in 2008. Prior to that he had yearly stress testing done. He has no history of DM, HTN, or tobacco use.  He does have a family history of CAD with father dying at age 31 with MI. palpitations. ETT in April 2021 was normal.    He developed HTN while on Imbruvica  for treatment of lymphoma. Was on  Valsartan  and triamterene  HCT. Developed edema on amlodipine . He then developed worsening renal function with creatinine up to 2.14 and potassium up to 6.6.  Renal US  was normal. His BP medication was switched to hydralazine  100 mg tid. He did have recurrent lymphoma and was treated with IV chemo with Rituxan  and bendamustine .   BP had improved and hydralazine  was reduced to 25 mg tid.  Echo done in Nov 2023 was normal.  On follow up today reports cancer in remission but has been dealing with low blood counts. Improved with IV iron and IVIG infusions. BP has been very good. Other than feeling tired denies any chest pain or dyspnea.   Past Medical History:  Diagnosis Date   Anemia of chronic renal failure, stage 3a (HCC) 07/14/2022   Arthritis    Hemorrhoids 2014   Hyperlipidemia    Hypertension 08/09/2019   Hypogammaglobulinemia (HCC) 03/02/2023   Marginal zone lymphoma of intra-abdominal lymph nodes (HCC) 01/27/2011   Thyroid  disease 2007   hypothyroidism    Past  Surgical History:  Procedure Laterality Date   COLONOSCOPY  2008   Dr Aneita   COLONOSCOPY W/ POLYPECTOMY  2014   Dr Aneita   IR BONE MARROW BIOPSY & ASPIRATION  06/27/2022   IR IMAGING GUIDED PORT INSERTION  01/26/2022   POLYPECTOMY     PORTACATH PLACEMENT  Dec 2012   REMOVED spring 2014   TONSILLECTOMY       Current Outpatient Medications  Medication Sig Dispense Refill   furosemide (LASIX) 40 MG tablet Take 40 mg by mouth daily as needed.     levothyroxine  (SYNTHROID ) 137 MCG tablet TAKE 1 TABLET BY MOUTH DAILY BEFORE BREAKFAST. 90 tablet 0   Multiple Vitamins-Minerals (CENTRUM SILVER 50+MEN PO) Take by mouth daily.     rosuvastatin  (CRESTOR ) 20 MG tablet TAKE 1 TABLET BY MOUTH EVERY DAY 90 tablet 1   venlafaxine  XR (EFFEXOR -XR) 37.5 MG 24 hr capsule TAKE 1 CAPSULE BY MOUTH DAILY WITH BREAKFAST. 90 capsule 2   hydrALAZINE  (APRESOLINE ) 25 MG tablet Take 1 tablet (25 mg total) by mouth 2 (two) times daily. 270 tablet 1   No current facility-administered medications for this visit.    Allergies:   Patient has no known allergies.    Social History:  The patient  reports that he has never smoked. He has never used smokeless tobacco. He reports current alcohol use. He  reports that he does not use drugs.   Family History:  The patient's family history includes Alzheimer's disease in his mother; COPD in his brother; Coronary artery disease in his brother; Dementia in his brother; Diabetes in his brother; Heart attack (age of onset: 36) in his father; Heart disease in his paternal grandfather; Hypertension in his brother; Stroke in his brother.    ROS:  Please see the history of present illness.   Otherwise, review of systems are positive for none.   All other systems are reviewed and negative.    PHYSICAL EXAM: VS:  BP 118/70   Pulse 95   Ht 5' 11 (1.803 m)   Wt 203 lb 12.8 oz (92.4 kg)   SpO2 99%   BMI 28.42 kg/m  , BMI Body mass index is 28.42 kg/m. GEN: Well nourished, well  developed, in no acute distress  HEENT: normal  Neck: no JVD, carotid bruits, or masses Cardiac: RRR; no murmurs, rubs, or gallops,no edema  Respiratory:  clear to auscultation bilaterally, normal work of breathing GI: soft, nontender, nondistended, + BS MS: no deformity or atrophy  Skin: warm and dry, no rash Neuro:  Strength and sensation are intact Psych: euthymic mood, full affect   EKG Interpretation Date/Time:  Wednesday April 26 2023 11:12:02 EST Ventricular Rate:  94 PR Interval:  180 QRS Duration:  100 QT Interval:  346 QTC Calculation: 432 R Axis:   52  Text Interpretation: Normal sinus rhythm  Normal ECG When compared with ECG of 01-Feb-2022 14:42,  No significant change was found  Confirmed by Windi Toro [phone]) on 04/26/2023 11:14:19 AM  , Recent Labs: 02/10/2023: TSH 0.061 03/30/2023: ALT 14; BUN 26; Creatinine 1.54; Hemoglobin 12.1; Platelet Count 141; Potassium 3.8; Sodium 139    Lipid Panel    Component Value Date/Time   CHOL 136 05/21/2021 0655   TRIG 90 05/21/2021 0655   TRIG 175 (H) 10/19/2015 0903   TRIG 106 02/22/2006 1133   HDL 60 05/21/2021 0655   CHOLHDL 2.3 05/21/2021 0655   CHOLHDL 3 08/07/2019 0857   VLDL 21.6 08/07/2019 0857   LDLCALC 59 05/21/2021 0655      Wt Readings from Last 3 Encounters:  04/26/23 203 lb 12.8 oz (92.4 kg)  04/24/23 203 lb (92.1 kg)  03/30/23 200 lb (90.7 kg)      Other studies Reviewed: Additional studies/ records that were reviewed today include:   ETT 07/04/20: Study Highlights  Blood pressure demonstrated a hypertensive response to exercise. Clinically negative, electrically negative for ischemia   Echo 01/26/22: IMPRESSIONS     1. Left ventricular ejection fraction, by estimation, is 55 to 60%. The  left ventricle has normal function. The left ventricle has no regional  wall motion abnormalities. There is mild concentric left ventricular  hypertrophy of the basal-septal segment.  Left  ventricular diastolic parameters were normal.   2. Right ventricular systolic function is normal. The right ventricular  size is normal.   3. The mitral valve is grossly normal. Trivial mitral valve  regurgitation. No evidence of mitral stenosis.   4. The aortic valve is calcified. Aortic valve regurgitation is not  visualized. Aortic valve sclerosis/calcification is present, without any  evidence of aortic stenosis.   5. There is mild dilatation of the ascending aorta, measuring 39 mm.   ASSESSMENT AND PLAN:  1.  Coronary calcification.  focal calcification of the left main. Otherwise no significant calcification.  ETT was normal in 2021. He remains asymptomatic. Recommend  continued risk factor modification.   2. Hypercholesterolemia. Now on  Crestor . Due to have lipid panel rechecked.   3. Marginal zone lymphoma recurrent. Per oncology  4. HTN- currently under good control with low dose hydralazine . Recommend reducing hydralazine  to 25 mg bid and monitor.   5. CKD stage 3b- followed by Dr Jerrye with [facility]. Creatinine 1.54    Current medicines are reviewed at length with the patient today.  The patient does not have concerns regarding medicines.  The following changes have been made:  no change  Labs/ tests ordered today include:   Orders Placed This Encounter  Procedures   EKG 12-Lead      Disposition:   FU one  year  Signed, Zubin Pontillo, MD  04/26/2023 11:23 AM    [facility] [address], [place], [place], [address] Phone [phone], Fax [phone]

## 2023-04-23 NOTE — Patient Instructions (Addendum)
Call GI to schedule your colonoscopy Phone: [phone]    Blood work was ordered.   This can be done with Oncology    Medications changes include :   None    Return in about 1 year (around 04/23/2024) for Physical Exam.    Health Maintenance, Male Adopting a healthy lifestyle and getting preventive care are important in promoting health and wellness. Ask your health care provider about: The right schedule for you to have regular tests and exams. Things you can do on your own to prevent diseases and keep yourself healthy. What should I know about diet, weight, and exercise? Eat a healthy diet  Eat a diet that includes plenty of vegetables, fruits, low-fat dairy products, and lean protein. Do not eat a lot of foods that are high in solid fats, added sugars, or sodium. Maintain a healthy weight Body mass index (BMI) is a measurement that can be used to identify possible weight problems. It estimates body fat based on height and weight. Your health care provider can help determine your BMI and help you achieve or maintain a healthy weight. Get regular exercise Get regular exercise. This is one of the most important things you can do for your health. Most adults should: Exercise for at least 150 minutes each week. The exercise should increase your heart rate and make you sweat (moderate-intensity exercise). Do strengthening exercises at least twice a week. This is in addition to the moderate-intensity exercise. Spend less time sitting. Even light physical activity can be beneficial. Watch cholesterol and blood lipids Have your blood tested for lipids and cholesterol at 71 years of age, then have this test every 5 years. You may need to have your cholesterol levels checked more often if: Your lipid or cholesterol levels are high. You are older than 71 years of age. You are at high risk for heart disease. What should I know about cancer screening? Many types of cancers can be  detected early and may often be prevented. Depending on your health history and family history, you may need to have cancer screening at various ages. This may include screening for: Colorectal cancer. Prostate cancer. Skin cancer. Lung cancer. What should I know about heart disease, diabetes, and high blood pressure? Blood pressure and heart disease High blood pressure causes heart disease and increases the risk of stroke. This is more likely to develop in people who have high blood pressure readings or are overweight. Talk with your health care provider about your target blood pressure readings. Have your blood pressure checked: Every 3-5 years if you are 76-3 years of age. Every year if you are 76 years old or older. If you are between the ages of 59 and 27 and are a current or former smoker, ask your health care provider if you should have a one-time screening for abdominal aortic aneurysm (AAA). Diabetes Have regular diabetes screenings. This checks your fasting blood sugar level. Have the screening done: Once every three years after age 78 if you are at a normal weight and have a low risk for diabetes. More often and at a younger age if you are overweight or have a high risk for diabetes. What should I know about preventing infection? Hepatitis B If you have a higher risk for hepatitis B, you should be screened for this virus. Talk with your health care provider to find out if you are at risk for hepatitis B infection. Hepatitis C Blood testing is recommended for: Everyone born  from 1945 through 1965. Anyone with known risk factors for hepatitis C. Sexually transmitted infections (STIs) You should be screened each year for STIs, including gonorrhea and chlamydia, if: You are sexually active and are younger than 71 years of age. You are older than 71 years of age and your health care provider tells you that you are at risk for this type of infection. Your sexual activity has changed  since you were last screened, and you are at increased risk for chlamydia or gonorrhea. Ask your health care provider if you are at risk. Ask your health care provider about whether you are at high risk for HIV. Your health care provider may recommend a prescription medicine to help prevent HIV infection. If you choose to take medicine to prevent HIV, you should first get tested for HIV. You should then be tested every 3 months for as long as you are taking the medicine. Follow these instructions at home: Alcohol use Do not drink alcohol if your health care provider tells you not to drink. If you drink alcohol: Limit how much you have to 0-2 drinks a day. Know how much alcohol is in your drink. In the [place] one drink equals one 12 oz bottle of beer (355 mL), one 5 oz glass of wine (148 mL), or one 1 oz glass of hard liquor (44 mL). Lifestyle Do not use any products that contain nicotine or tobacco. These products include cigarettes, chewing tobacco, and vaping devices, such as e-cigarettes. If you need help quitting, ask your health care provider. Do not use street drugs. Do not share needles. Ask your health care provider for help if you need support or information about quitting drugs. General instructions Schedule regular health, dental, and eye exams. Stay current with your vaccines. Tell your health care provider if: You often feel depressed. You have ever been abused or do not feel safe at home. Summary Adopting a healthy lifestyle and getting preventive care are important in promoting health and wellness. Follow your health care provider's instructions about healthy diet, exercising, and getting tested or screened for diseases. Follow your health care provider's instructions on monitoring your cholesterol and blood pressure. This information is not intended to replace advice given to you by your health care provider. Make sure you discuss any questions you have with your health care  provider. Document Revised: 07/27/2020 Document Reviewed: 07/27/2020 Elsevier Patient Education  2024 [employer].

## 2023-04-24 ENCOUNTER — Ambulatory Visit (INDEPENDENT_AMBULATORY_CARE_PROVIDER_SITE_OTHER): Payer: PPO | Admitting: Internal Medicine

## 2023-04-24 VITALS — BP 108/70 | HR 77 | Temp 98.4°F | Ht 71.0 in | Wt 203.0 lb

## 2023-04-24 DIAGNOSIS — E7849 Other hyperlipidemia: Secondary | ICD-10-CM

## 2023-04-24 DIAGNOSIS — Z0000 Encounter for general adult medical examination without abnormal findings: Secondary | ICD-10-CM | POA: Diagnosis not present

## 2023-04-24 DIAGNOSIS — N1831 Chronic kidney disease, stage 3a: Secondary | ICD-10-CM | POA: Diagnosis not present

## 2023-04-24 DIAGNOSIS — R739 Hyperglycemia, unspecified: Secondary | ICD-10-CM | POA: Diagnosis not present

## 2023-04-24 DIAGNOSIS — D631 Anemia in chronic kidney disease: Secondary | ICD-10-CM

## 2023-04-24 DIAGNOSIS — I159 Secondary hypertension, unspecified: Secondary | ICD-10-CM

## 2023-04-24 DIAGNOSIS — E032 Hypothyroidism due to medicaments and other exogenous substances: Secondary | ICD-10-CM

## 2023-04-24 NOTE — Assessment & Plan Note (Signed)
Chronic Check lipid panel, cmp  Continue Crestor 20 mg daily Regular exercise and healthy diet encouraged

## 2023-04-24 NOTE — Assessment & Plan Note (Signed)
Chronic Following with nephrology Kidney function stable Blood pressure well-controlled

## 2023-04-24 NOTE — Assessment & Plan Note (Signed)
Chronic Tsh with next labs with onc Continue levothyroxine 137 mcg daily - will titrate as needed

## 2023-04-24 NOTE — Assessment & Plan Note (Signed)
Chronic Check a1c Low sugar / carb diet Stressed regular exercise  

## 2023-04-24 NOTE — Assessment & Plan Note (Signed)
Chronic Blood pressure well-controlled  Reviewed last CMP Continue hydralazine 100 mg 3 times daily

## 2023-04-24 NOTE — Assessment & Plan Note (Signed)
Chronic Currently getting Aranesp via heme-onc

## 2023-04-25 ENCOUNTER — Encounter: Payer: Self-pay | Admitting: Gastroenterology

## 2023-04-26 ENCOUNTER — Encounter: Payer: Self-pay | Admitting: Cardiology

## 2023-04-26 ENCOUNTER — Ambulatory Visit: Payer: PPO | Attending: Cardiology | Admitting: Cardiology

## 2023-04-26 VITALS — BP 118/70 | HR 95 | Ht 71.0 in | Wt 203.8 lb

## 2023-04-26 DIAGNOSIS — R931 Abnormal findings on diagnostic imaging of heart and coronary circulation: Secondary | ICD-10-CM | POA: Diagnosis not present

## 2023-04-26 DIAGNOSIS — I159 Secondary hypertension, unspecified: Secondary | ICD-10-CM | POA: Diagnosis not present

## 2023-04-26 DIAGNOSIS — E7849 Other hyperlipidemia: Secondary | ICD-10-CM | POA: Diagnosis not present

## 2023-04-26 MED ORDER — HYDRALAZINE HCL 25 MG PO TABS: 25.0000 mg | ORAL_TABLET | Freq: Two times a day (BID) | ORAL | 1 refills | Status: DC

## 2023-04-26 NOTE — Patient Instructions (Addendum)
 Medication Instructions:  Decrease Hydralazine  to 25 mg twice a day *If you need a refill on your cardiac medications before your next appointment, please call your pharmacy*   Lab Work: None ordered   Testing/Procedures: None ordered   Follow-Up: At [facility], you and your health needs are our priority.  As part of our continuing mission to provide you with exceptional heart care, we have created designated Provider Care Teams.  These Care Teams include your primary Cardiologist (physician) and Advanced Practice Providers (APPs -  Physician Assistants and Nurse Practitioners) who all work together to provide you with the care you need, when you need it.  We recommend signing up for the patient portal called MyChart.  Sign up information is provided on this After Visit Summary.  MyChart is used to connect with patients for Virtual Visits (Telemedicine).  Patients are able to view lab/test results, encounter notes, upcoming appointments, etc.  Non-urgent messages can be sent to your provider as well.   To learn more about what you can do with MyChart, go to [email or web address].    Your next appointment:  1 year     Call in Oct to schedule Feb appointment     Provider:  Dr.Jordan

## 2023-04-27 ENCOUNTER — Encounter: Payer: Self-pay | Admitting: *Deleted

## 2023-04-27 ENCOUNTER — Inpatient Hospital Stay: Payer: PPO

## 2023-04-27 ENCOUNTER — Inpatient Hospital Stay: Payer: PPO | Attending: Hematology & Oncology

## 2023-04-27 ENCOUNTER — Other Ambulatory Visit: Payer: Self-pay | Admitting: Internal Medicine

## 2023-04-27 ENCOUNTER — Inpatient Hospital Stay (HOSPITAL_BASED_OUTPATIENT_CLINIC_OR_DEPARTMENT_OTHER): Payer: PPO | Admitting: Hematology & Oncology

## 2023-04-27 ENCOUNTER — Encounter: Payer: Self-pay | Admitting: Hematology & Oncology

## 2023-04-27 VITALS — BP 120/72 | HR 99 | Temp 97.9°F | Resp 19 | Ht 71.0 in | Wt 204.0 lb

## 2023-04-27 VITALS — BP 137/79 | HR 65 | Temp 98.7°F | Resp 18

## 2023-04-27 DIAGNOSIS — C8583 Other specified types of non-Hodgkin lymphoma, intra-abdominal lymph nodes: Secondary | ICD-10-CM

## 2023-04-27 DIAGNOSIS — J209 Acute bronchitis, unspecified: Secondary | ICD-10-CM

## 2023-04-27 DIAGNOSIS — N1831 Chronic kidney disease, stage 3a: Secondary | ICD-10-CM

## 2023-04-27 DIAGNOSIS — R200 Anesthesia of skin: Secondary | ICD-10-CM

## 2023-04-27 DIAGNOSIS — I159 Secondary hypertension, unspecified: Secondary | ICD-10-CM

## 2023-04-27 DIAGNOSIS — E7849 Other hyperlipidemia: Secondary | ICD-10-CM

## 2023-04-27 DIAGNOSIS — E032 Hypothyroidism due to medicaments and other exogenous substances: Secondary | ICD-10-CM | POA: Diagnosis not present

## 2023-04-27 DIAGNOSIS — D801 Nonfamilial hypogammaglobulinemia: Secondary | ICD-10-CM | POA: Diagnosis not present

## 2023-04-27 DIAGNOSIS — Z79899 Other long term (current) drug therapy: Secondary | ICD-10-CM | POA: Diagnosis not present

## 2023-04-27 DIAGNOSIS — R739 Hyperglycemia, unspecified: Secondary | ICD-10-CM

## 2023-04-27 DIAGNOSIS — D509 Iron deficiency anemia, unspecified: Secondary | ICD-10-CM | POA: Diagnosis not present

## 2023-04-27 DIAGNOSIS — U071 COVID-19: Secondary | ICD-10-CM

## 2023-04-27 DIAGNOSIS — D126 Benign neoplasm of colon, unspecified: Secondary | ICD-10-CM

## 2023-04-27 DIAGNOSIS — B029 Zoster without complications: Secondary | ICD-10-CM

## 2023-04-27 DIAGNOSIS — E030 Congenital hypothyroidism with diffuse goiter: Secondary | ICD-10-CM | POA: Diagnosis not present

## 2023-04-27 DIAGNOSIS — E039 Hypothyroidism, unspecified: Secondary | ICD-10-CM

## 2023-04-27 DIAGNOSIS — D72829 Elevated white blood cell count, unspecified: Secondary | ICD-10-CM

## 2023-04-27 DIAGNOSIS — R42 Dizziness and giddiness: Secondary | ICD-10-CM

## 2023-04-27 DIAGNOSIS — M79662 Pain in left lower leg: Secondary | ICD-10-CM

## 2023-04-27 DIAGNOSIS — C439 Malignant melanoma of skin, unspecified: Secondary | ICD-10-CM

## 2023-04-27 DIAGNOSIS — D631 Anemia in chronic kidney disease: Secondary | ICD-10-CM

## 2023-04-27 DIAGNOSIS — R053 Chronic cough: Secondary | ICD-10-CM

## 2023-04-27 DIAGNOSIS — L299 Pruritus, unspecified: Secondary | ICD-10-CM

## 2023-04-27 DIAGNOSIS — R109 Unspecified abdominal pain: Secondary | ICD-10-CM

## 2023-04-27 LAB — CBC WITH DIFFERENTIAL (CANCER CENTER ONLY)
Abs Immature Granulocytes: 0.02 10*3/uL (ref 0.00–0.07)
Basophils Absolute: 0.1 10*3/uL (ref 0.0–0.1)
Basophils Relative: 1 %
Eosinophils Absolute: 0.1 10*3/uL (ref 0.0–0.5)
Eosinophils Relative: 2 %
HCT: 34.8 % — ABNORMAL LOW (ref 39.0–52.0)
Hemoglobin: 11.7 g/dL — ABNORMAL LOW (ref 13.0–17.0)
Immature Granulocytes: 0 %
Lymphocytes Relative: 19 %
Lymphs Abs: 1 10*3/uL (ref 0.7–4.0)
MCH: 32.2 pg (ref 26.0–34.0)
MCHC: 33.6 g/dL (ref 30.0–36.0)
MCV: 95.9 fL (ref 80.0–100.0)
Monocytes Absolute: 0.7 10*3/uL (ref 0.1–1.0)
Monocytes Relative: 14 %
Neutro Abs: 3.3 10*3/uL (ref 1.7–7.7)
Neutrophils Relative %: 64 %
Platelet Count: 155 10*3/uL (ref 150–400)
RBC: 3.63 MIL/uL — ABNORMAL LOW (ref 4.22–5.81)
RDW: 14.7 % (ref 11.5–15.5)
WBC Count: 5.2 10*3/uL (ref 4.0–10.5)
nRBC: 0 % (ref 0.0–0.2)

## 2023-04-27 LAB — CMP (CANCER CENTER ONLY)
ALT: 17 U/L (ref 0–44)
AST: 20 U/L (ref 15–41)
Albumin: 4.3 g/dL (ref 3.5–5.0)
Alkaline Phosphatase: 53 U/L (ref 38–126)
Anion gap: 8 (ref 5–15)
BUN: 25 mg/dL — ABNORMAL HIGH (ref 8–23)
CO2: 27 mmol/L (ref 22–32)
Calcium: 9.6 mg/dL (ref 8.9–10.3)
Chloride: 105 mmol/L (ref 98–111)
Creatinine: 1.5 mg/dL — ABNORMAL HIGH (ref 0.61–1.24)
GFR, Estimated: 50 mL/min — ABNORMAL LOW (ref >60)
Glucose, Bld: 101 mg/dL — ABNORMAL HIGH (ref 70–99)
Potassium: 3.7 mmol/L (ref 3.5–5.1)
Sodium: 140 mmol/L (ref 135–145)
Total Bilirubin: 0.4 mg/dL (ref 0.0–1.2)
Total Protein: 7 g/dL (ref 6.5–8.1)

## 2023-04-27 LAB — RETICULOCYTES
Immature Retic Fract: 14.9 % (ref 2.3–15.9)
RBC.: 3.66 MIL/uL — ABNORMAL LOW (ref 4.22–5.81)
Retic Count, Absolute: 58.6 10*3/uL (ref 19.0–186.0)
Retic Ct Pct: 1.6 % (ref 0.4–3.1)

## 2023-04-27 LAB — IRON AND IRON BINDING CAPACITY (CC-WL,HP ONLY)
Iron: 100 ug/dL (ref 45–182)
Saturation Ratios: 36 % (ref 17.9–39.5)
TIBC: 280 ug/dL (ref 250–450)
UIBC: 180 ug/dL (ref 117–376)

## 2023-04-27 LAB — FERRITIN: Ferritin: 470 ng/mL — ABNORMAL HIGH (ref 24–336)

## 2023-04-27 LAB — LACTATE DEHYDROGENASE: LDH: 128 U/L (ref 98–192)

## 2023-04-27 MED ORDER — IMMUNE GLOBULIN (HUMAN) 40 GM/400ML IV SOLN
40.0000 g | Freq: Once | INTRAVENOUS | Status: AC
  Administered 2023-04-27: 40 g via INTRAVENOUS
  Filled 2023-04-27: qty 400

## 2023-04-27 MED ORDER — HEPARIN SOD (PORK) LOCK FLUSH 100 UNIT/ML IV SOLN
500.0000 [IU] | Freq: Once | INTRAVENOUS | Status: AC | PRN
  Administered 2023-04-27: 500 [IU]

## 2023-04-27 MED ORDER — FAMOTIDINE IN NACL 20-0.9 MG/50ML-% IV SOLN
20.0000 mg | INTRAVENOUS | Status: AC
  Administered 2023-04-27: 20 mg via INTRAVENOUS
  Filled 2023-04-27: qty 50

## 2023-04-27 MED ORDER — DEXTROSE 5 % IV SOLN: INTRAVENOUS | Status: DC

## 2023-04-27 MED ORDER — SODIUM CHLORIDE 0.9% FLUSH
10.0000 mL | Freq: Once | INTRAVENOUS | Status: AC | PRN
  Administered 2023-04-27: 10 mL

## 2023-04-27 MED ORDER — ACETAMINOPHEN 325 MG PO TABS
650.0000 mg | ORAL_TABLET | Freq: Once | ORAL | Status: AC
  Administered 2023-04-27: 650 mg via ORAL
  Filled 2023-04-27: qty 2

## 2023-04-27 NOTE — Progress Notes (Signed)
 12 Hematology and Oncology Follow Up Visit  [name] [name] [id number] 07-29-1950 73 y.o. 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 is back for follow-up.  He seems to be doing a lot better.  I think that he IVIG is helping.  Also, the IV iron that we have been giving and also was helping.  No fever.  He has had no cough.  He has had no nausea or vomiting.  There has been no diarrhea.  His leg swelling seems to be doing better.  I think renal function is improving.  His monoclonal studies again that we checked back in early January showed a monoclonal spike of 0.4 g/dL.  His IgM level was 350 mg/dL.  He has had a good appetite.  Overall, I would say his performance status is probably ECOG 1.  His last monoclonal studies showed M spike 0.3 g/dL.  His IgM level was 300 mg/dL.  The Kappa light chain was 5 mg/dL.  Overall, I would say his performance status is probably ECOG 0.     Medications:  Current Outpatient Medications:    hydrALAZINE  (APRESOLINE ) 25 MG tablet, Take 1 tablet (25 mg total) by mouth 2 (two) times daily., Disp: 270 tablet, Rfl: 1   levothyroxine  (SYNTHROID ) 137 MCG tablet, TAKE 1 TABLET BY MOUTH DAILY BEFORE BREAKFAST., Disp: 90 tablet, Rfl: 0   Melatonin 5 MG CAPS, Take by mouth at bedtime as needed., Disp: , Rfl:    Multiple Vitamins-Minerals (CENTRUM SILVER 50+MEN PO),  Take by mouth daily., Disp: , Rfl:    rosuvastatin  (CRESTOR ) 20 MG tablet, TAKE 1 TABLET BY MOUTH EVERY DAY, Disp: 90 tablet, Rfl: 1   venlafaxine  XR (EFFEXOR -XR) 37.5 MG 24 hr capsule, TAKE 1 CAPSULE BY MOUTH DAILY WITH BREAKFAST., Disp: 90 capsule, Rfl: 2   furosemide (LASIX) 40 MG tablet, Take 40 mg by mouth daily as needed. (Patient not taking: Reported on 04/27/2023), Disp: , Rfl:  No current facility-administered medications for this visit.  Facility-Administered Medications Ordered in Other Visits:    acetaminophen  (TYLENOL ) tablet 650 mg, 650 mg, Oral, Once, Merna Baldi, Maude SAUNDERS, MD   dextrose  5 % solution, , Intravenous, Continuous, Willean Schurman, Maude SAUNDERS, MD   famotidine  (PEPCID ) IVPB 20 mg premix, 20 mg, Intravenous, Q15 min, Bandy Honaker, Maude SAUNDERS, MD   heparin  lock flush 100 unit/mL, 500 Units, Intracatheter, Once PRN, Jaquelinne Glendening R, MD   Immune Globulin  10% IV infusion 40 g, 40 g, Intravenous, Once, Terena Bohan, Maude SAUNDERS, MD   sodium chloride  flush (NS) 0.9 % injection 10 mL, 10 mL, Intracatheter, Once PRN, Haylin Camilli, Maude SAUNDERS, MD  Allergies: No Known Allergies  Past Medical History, Surgical history, Social history, and Family History were reviewed and updated.  Review of Systems: Review of Systems  Constitutional: Negative.   HENT:  Negative.    Eyes: Negative.   Respiratory: Negative.    Cardiovascular: Negative.  Gastrointestinal:  Positive for abdominal pain and nausea.  Endocrine: Negative.   Genitourinary: Negative.    Musculoskeletal: Negative.   Skin: Negative.   Neurological: Negative.   Hematological: Negative.   Psychiatric/Behavioral: Negative.      Physical Exam: Vital signs are temperature of 97.9.  Pulse 99.  Blood pressure 120/72.  Weight is 204 pounds.  We got a very nice low we will leave it off feels very very free good job    Wt Readings from Last 3 Encounters:  04/27/23 204 lb (92.5 kg)  04/26/23 203 lb 12.8 oz (92.4 kg)  04/24/23 203 lb (92.1 kg)     Physical Exam Vitals reviewed.  HENT:     Head: Normocephalic and atraumatic.  Eyes:     Pupils: Pupils are equal, round, and reactive to light.  Cardiovascular:     Rate and Rhythm: Normal rate and regular rhythm.     Heart sounds: Normal heart sounds.  Pulmonary:     Effort: Pulmonary effort is normal.     Breath sounds: Normal breath sounds.  Abdominal:     General: Bowel sounds are normal.     Palpations: Abdomen is soft.  Musculoskeletal:        General: No tenderness or deformity. Normal range of motion.     Cervical back: Normal range of motion.  Lymphadenopathy:     Cervical: No cervical adenopathy.  Skin:    General: Skin is warm and dry.     Findings: No erythema or rash.  Neurological:     Mental Status: He is alert and oriented to person, place, and time.  Psychiatric:        Behavior: Behavior normal.        Thought Content: Thought content normal.        Judgment: Judgment normal.    Lab Results  Component Value Date   WBC 5.2 04/27/2023   HGB 11.7 (L) 04/27/2023   HCT 34.8 (L) 04/27/2023   MCV 95.9 04/27/2023   PLT 155 04/27/2023     Chemistry      Component Value Date/Time   NA 139 03/30/2023 0809   NA 138 12/06/2019 1501   NA 142 04/25/2016 0844   NA 140 10/19/2015 0903   K 3.8 03/30/2023 0809   K 4.1 04/25/2016 0844   K 4.1 10/19/2015 0903   CL 103 03/30/2023 0809   CL 102 04/25/2016 0844   CO2 27 03/30/2023 0809   CO2 29 04/25/2016 0844   CO2 25 10/19/2015 0903   BUN 26 (H) 03/30/2023 0809   BUN 20 12/06/2019 1501   BUN 14 04/25/2016 0844   BUN 21.6 10/19/2015 0903   CREATININE 1.54 (H) 03/30/2023 0809   CREATININE 1.1 04/25/2016 0844   CREATININE 1.3 10/19/2015 0903      Component Value Date/Time   CALCIUM  9.5 03/30/2023 0809   CALCIUM  9.6 04/25/2016 0844   CALCIUM  9.4 10/19/2015 0903   ALKPHOS 59 03/30/2023 0809   ALKPHOS 59 04/25/2016 0844   ALKPHOS 68 10/19/2015 0903   AST 18 03/30/2023 0809   AST 21 10/19/2015 0903    ALT 14 03/30/2023 0809   ALT 27 04/25/2016 0844   ALT 19 10/19/2015 0903   BILITOT 0.5 03/30/2023 0809   BILITOT 0.54 10/19/2015 0903      Impression and Plan: [name] is a 73 year old white male.  We treated him 10 years ago.  He had a marginal zone lymphoma.  He went into clinical  remission.  When he initially presented, he had splenomegaly.  When he relapsed, he again had splenomegaly.  We subsequently treated him with Imbruvica  and venetoclax .  He completed his treatments back in April.  He has completed 6 cycles of Rituxan /Bendamustine .  He completed this back in March 2024.  I am so happy that he is doing better now.  The IVIG seems to be helping.  His last IgG level was milligrams per deciliter.  Hopefully, we can start moving appointments out a little bit longer.  I will plan to get him back in 6 weeks.  He does not need any Aranesp  today.  Maude JONELLE Crease, MD 2/6/20259:47 AM

## 2023-04-27 NOTE — Patient Instructions (Signed)
 Immune Globulin  Injection What is this medication? IMMUNE GLOBULIN  (im MUNE GLOB yoo lin) treats many immune system conditions. It works by Designer, multimedia extra antibodies. Antibodies are proteins made by the immune system that help protect the body. This medicine may be used for other purposes; ask your health care provider or pharmacist if you have questions. COMMON BRAND NAME(S): ASCENIV, Baygam, BIVIGAM, Carimune, Carimune NF, cutaquig, Cuvitru, Flebogamma, Flebogamma DIF, GamaSTAN, GamaSTAN S/D, Gamimune N, Gammagard, Gammagard S/D, Gammaked, Gammaplex, Gammar-P IV, Gamunex, Gamunex-C, Hizentra, Iveegam, Iveegam EN, Octagam, Panglobulin, Panglobulin NF, panzyga, Polygam S/D, Privigen , Sandoglobulin, Venoglobulin-S, Vigam, Vivaglobulin, Xembify What should I tell my care team before I take this medication? They need to know if you have any of these conditions: Blood clotting disorder Condition where you have excess fluid in your body, such as heart failure or edema Dehydration Diabetes Have had blood clots Heart disease Immune system conditions Kidney disease Low levels of IgA Recent or upcoming vaccine An unusual or allergic reaction to immune globulin , other medications, foods, dyes, or preservatives Pregnant or trying to get pregnant Breastfeeding How should I use this medication? This medication is infused into a vein or under the skin. It is usually given by your care team in a hospital or clinic setting. It may also be given at home. If you get this medication at home, you will be taught how to prepare and give it. Use exactly as directed. Take it as directed on the prescription label at the same time every day. Keep taking it unless your care team tells you to stop. It is important that you put your used needles and syringes in a special sharps container. Do not put them in a trash can. If you do not have a sharps container, call your pharmacist or care team to get one. Talk to  your care team about the use of this medication in children. While it may be given to children for selected conditions, precautions do apply. Overdosage: If you think you have taken too much of this medicine contact a poison control center or emergency room at once. NOTE: This medicine is only for you. Do not share this medicine with others. What if I miss a dose? If you get this medication at the hospital or clinic: It is important not to miss your dose. Call your care team if you are unable to keep an appointment. If you give yourself this medication at home: If you miss a dose, take it as soon as you can. Then continue your normal schedule. If it is almost time for your next dose, take only that dose. Do not take double or extra doses. Call your care team with questions. What may interact with this medication? Live virus vaccines This list may not describe all possible interactions. Give your health care provider a list of all the medicines, herbs, non-prescription drugs, or dietary supplements you use. Also tell them if you smoke, drink alcohol, or use illegal drugs. Some items may interact with your medicine. What should I watch for while using this medication? Your condition will be monitored carefully while you are receiving this medication. Tell your care team if your symptoms do not start to get better or if they get worse. You may need blood work done while you are taking this medication. This medication increases the risk of blood clots. People with heart, blood vessel, or blood clotting conditions are more likely to develop a blood clot. Other risk factors include advanced age, estrogen  use, tobacco use, lack of movement, and being overweight. This medication can decrease the response to a vaccine. If you need to get vaccinated, tell your care team if you have received this medication within the last year. Extra booster doses may be needed. Talk to your care team to see if a different  vaccination schedule is needed. If you have diabetes, you may get a falsely elevated blood sugar reading. Talk to your care team about how to check your blood sugar while taking this medication. What side effects may I notice from receiving this medication? Side effects that you should report to your care team as soon as possible: Allergic reactions--skin rash, itching, hives, swelling of the face, lips, tongue, or throat Blood clot--pain, swelling, or warmth in the leg, shortness of breath, chest pain Fever, neck pain or stiffness, sensitivity to light, headache, nausea, vomiting, confusion, which may be signs of meningitis Hemolytic anemia--unusual weakness or fatigue, dizziness, headache, trouble breathing, dark urine, yellowing skin or eyes Kidney injury--decrease in the amount of urine, swelling of the ankles, hands, or feet Low sodium level--muscle weakness, fatigue, dizziness, headache, confusion Shortness of breath or trouble breathing, cough, unusual weakness or fatigue, blue skin or lips Side effects that usually do not require medical attention (report these to your care team if they continue or are bothersome): Chills Diarrhea Fever Headache Nausea This list may not describe all possible side effects. Call your doctor for medical advice about side effects. You may report side effects to FDA at 1-800-FDA-1088. Where should I keep my medication? Keep out of the reach of children and pets. You will be instructed on how to store this medication. Get rid of any unused medication after the expiration date. To get rid of medications that are no longer needed or have expired: Take the medication to a medication take-back program. Check with your pharmacy or law enforcement to find a location. If you cannot return the medication, ask your pharmacist or care team how to get rid of this medication safely. NOTE: This sheet is a summary. It may not cover all possible information. If you have  questions about this medicine, talk to your doctor, pharmacist, or health care provider.  2024 Elsevier/Gold Standard (2023-02-17 00:00:00)

## 2023-04-27 NOTE — Patient Instructions (Signed)

## 2023-04-28 LAB — LIPID PANEL W/O CHOL/HDL RATIO
Cholesterol, Total: 151 mg/dL (ref 100–199)
HDL: 43 mg/dL (ref >39)
LDL Chol Calc (NIH): 78 mg/dL (ref 0–99)
Triglycerides: 173 mg/dL — ABNORMAL HIGH (ref 0–149)
VLDL Cholesterol Cal: 30 mg/dL (ref 5–40)

## 2023-04-28 LAB — HGB A1C W/O EAG: Hgb A1c MFr Bld: 5.4 % (ref 4.8–5.6)

## 2023-04-28 LAB — KAPPA/LAMBDA LIGHT CHAINS
Kappa free light chain: 41.3 mg/L — ABNORMAL HIGH (ref 3.3–19.4)
Kappa, lambda light chain ratio: 6.07 — ABNORMAL HIGH (ref 0.26–1.65)
Lambda free light chains: 6.8 mg/L (ref 5.7–26.3)

## 2023-04-28 LAB — TSH: TSH: 0.129 u[IU]/mL — ABNORMAL LOW (ref 0.450–4.500)

## 2023-04-29 LAB — IGG, IGA, IGM
IgA: 72 mg/dL (ref 61–437)
IgG (Immunoglobin G), Serum: 844 mg/dL (ref 603–1613)
IgM (Immunoglobulin M), Srm: 328 mg/dL — ABNORMAL HIGH (ref 20–172)

## 2023-04-30 MED ORDER — LEVOTHYROXINE SODIUM 137 MCG PO TABS: 137.0000 ug | ORAL_TABLET | Freq: Every day | ORAL | Status: DC

## 2023-04-30 NOTE — Addendum Note (Signed)
 Addended by: Colene Dauphin on: 04/30/2023 12:02 PM   Modules accepted: Orders

## 2023-05-08 LAB — PROTEIN ELECTROPHORESIS, SERUM, WITH REFLEX
A/G Ratio: 1.2 (ref 0.7–1.7)
Albumin ELP: 3.7 g/dL (ref 2.9–4.4)
Alpha-1-Globulin: 0.2 g/dL (ref 0.0–0.4)
Alpha-2-Globulin: 1 g/dL (ref 0.4–1.0)
Beta Globulin: 0.8 g/dL (ref 0.7–1.3)
Gamma Globulin: 1 g/dL (ref 0.4–1.8)
Globulin, Total: 3.1 g/dL (ref 2.2–3.9)
M-Spike, %: 0.4 g/dL — ABNORMAL HIGH
SPEP Interpretation: 0
Total Protein ELP: 6.8 g/dL (ref 6.0–8.5)

## 2023-05-08 LAB — IMMUNOFIXATION REFLEX, SERUM
IgA: 73 mg/dL (ref 61–437)
IgG (Immunoglobin G), Serum: 959 mg/dL (ref 603–1613)
IgM (Immunoglobulin M), Srm: 361 mg/dL — ABNORMAL HIGH (ref 20–172)

## 2023-05-18 ENCOUNTER — Encounter: Payer: Self-pay | Admitting: Hematology & Oncology

## 2023-05-23 ENCOUNTER — Ambulatory Visit (AMBULATORY_SURGERY_CENTER): Payer: PPO

## 2023-05-23 VITALS — Ht 71.0 in | Wt 204.0 lb

## 2023-05-23 DIAGNOSIS — Z860100 Personal history of colon polyps, unspecified: Secondary | ICD-10-CM

## 2023-05-23 MED ORDER — SUFLAVE 178.7 G PO SOLR: 1.0000 | ORAL | 0 refills | Status: DC

## 2023-05-23 NOTE — Progress Notes (Signed)

## 2023-06-02 ENCOUNTER — Encounter: Payer: Self-pay | Admitting: Gastroenterology

## 2023-06-08 ENCOUNTER — Inpatient Hospital Stay: Payer: PPO

## 2023-06-08 ENCOUNTER — Inpatient Hospital Stay: Payer: PPO | Attending: Hematology & Oncology

## 2023-06-08 ENCOUNTER — Encounter: Payer: Self-pay | Admitting: Medical Oncology

## 2023-06-08 ENCOUNTER — Inpatient Hospital Stay (HOSPITAL_BASED_OUTPATIENT_CLINIC_OR_DEPARTMENT_OTHER): Payer: PPO | Admitting: Medical Oncology

## 2023-06-08 VITALS — BP 139/82 | HR 63 | Resp 18

## 2023-06-08 VITALS — BP 118/75 | HR 78 | Temp 98.2°F | Resp 18 | Ht 71.0 in | Wt 209.8 lb

## 2023-06-08 DIAGNOSIS — N1831 Chronic kidney disease, stage 3a: Secondary | ICD-10-CM | POA: Diagnosis not present

## 2023-06-08 DIAGNOSIS — Z95828 Presence of other vascular implants and grafts: Secondary | ICD-10-CM

## 2023-06-08 DIAGNOSIS — Z79899 Other long term (current) drug therapy: Secondary | ICD-10-CM | POA: Insufficient documentation

## 2023-06-08 DIAGNOSIS — D801 Nonfamilial hypogammaglobulinemia: Secondary | ICD-10-CM | POA: Insufficient documentation

## 2023-06-08 DIAGNOSIS — D509 Iron deficiency anemia, unspecified: Secondary | ICD-10-CM | POA: Diagnosis not present

## 2023-06-08 DIAGNOSIS — C8583 Other specified types of non-Hodgkin lymphoma, intra-abdominal lymph nodes: Secondary | ICD-10-CM

## 2023-06-08 DIAGNOSIS — D508 Other iron deficiency anemias: Secondary | ICD-10-CM | POA: Diagnosis not present

## 2023-06-08 DIAGNOSIS — E032 Hypothyroidism due to medicaments and other exogenous substances: Secondary | ICD-10-CM

## 2023-06-08 DIAGNOSIS — C8800 Waldenstrom macroglobulinemia not having achieved remission: Secondary | ICD-10-CM

## 2023-06-08 DIAGNOSIS — D631 Anemia in chronic kidney disease: Secondary | ICD-10-CM | POA: Diagnosis not present

## 2023-06-08 LAB — IRON AND IRON BINDING CAPACITY (CC-WL,HP ONLY)
Iron: 109 ug/dL (ref 45–182)
Saturation Ratios: 37 % (ref 17.9–39.5)
TIBC: 293 ug/dL (ref 250–450)
UIBC: 184 ug/dL (ref 117–376)

## 2023-06-08 LAB — CBC WITH DIFFERENTIAL (CANCER CENTER ONLY)
Abs Immature Granulocytes: 0.01 10*3/uL (ref 0.00–0.07)
Basophils Absolute: 0 10*3/uL (ref 0.0–0.1)
Basophils Relative: 1 %
Eosinophils Absolute: 0.1 10*3/uL (ref 0.0–0.5)
Eosinophils Relative: 2 %
HCT: 33.9 % — ABNORMAL LOW (ref 39.0–52.0)
Hemoglobin: 11.7 g/dL — ABNORMAL LOW (ref 13.0–17.0)
Immature Granulocytes: 0 %
Lymphocytes Relative: 24 %
Lymphs Abs: 1 10*3/uL (ref 0.7–4.0)
MCH: 33.6 pg (ref 26.0–34.0)
MCHC: 34.5 g/dL (ref 30.0–36.0)
MCV: 97.4 fL (ref 80.0–100.0)
Monocytes Absolute: 0.6 10*3/uL (ref 0.1–1.0)
Monocytes Relative: 16 %
Neutro Abs: 2.3 10*3/uL (ref 1.7–7.7)
Neutrophils Relative %: 57 %
Platelet Count: 152 10*3/uL (ref 150–400)
RBC: 3.48 MIL/uL — ABNORMAL LOW (ref 4.22–5.81)
RDW: 13.8 % (ref 11.5–15.5)
WBC Count: 4.1 10*3/uL (ref 4.0–10.5)
nRBC: 0 % (ref 0.0–0.2)

## 2023-06-08 LAB — CMP (CANCER CENTER ONLY)
ALT: 17 U/L (ref 0–44)
AST: 20 U/L (ref 15–41)
Albumin: 4.4 g/dL (ref 3.5–5.0)
Alkaline Phosphatase: 53 U/L (ref 38–126)
Anion gap: 7 (ref 5–15)
BUN: 20 mg/dL (ref 8–23)
CO2: 28 mmol/L (ref 22–32)
Calcium: 9.1 mg/dL (ref 8.9–10.3)
Chloride: 103 mmol/L (ref 98–111)
Creatinine: 1.54 mg/dL — ABNORMAL HIGH (ref 0.61–1.24)
GFR, Estimated: 48 mL/min — ABNORMAL LOW (ref >60)
Glucose, Bld: 85 mg/dL (ref 70–99)
Potassium: 3.9 mmol/L (ref 3.5–5.1)
Sodium: 138 mmol/L (ref 135–145)
Total Bilirubin: 0.4 mg/dL (ref 0.0–1.2)
Total Protein: 6.9 g/dL (ref 6.5–8.1)

## 2023-06-08 LAB — FERRITIN: Ferritin: 237 ng/mL (ref 24–336)

## 2023-06-08 LAB — LACTATE DEHYDROGENASE: LDH: 141 U/L (ref 98–192)

## 2023-06-08 MED ORDER — IMMUNE GLOBULIN (HUMAN) 20 GM/200ML IV SOLN
0.4000 g/kg | Freq: Once | INTRAVENOUS | Status: AC
  Administered 2023-06-08: 40 g via INTRAVENOUS
  Filled 2023-06-08: qty 400

## 2023-06-08 MED ORDER — ACETAMINOPHEN 325 MG PO TABS
650.0000 mg | ORAL_TABLET | Freq: Once | ORAL | Status: AC
  Administered 2023-06-08: 650 mg via ORAL
  Filled 2023-06-08: qty 2

## 2023-06-08 MED ORDER — DEXTROSE 5 % IV SOLN: INTRAVENOUS | Status: DC

## 2023-06-08 MED ORDER — SODIUM CHLORIDE 0.9% FLUSH
10.0000 mL | Freq: Once | INTRAVENOUS | Status: AC
  Administered 2023-06-08: 10 mL via INTRAVENOUS

## 2023-06-08 MED ORDER — FAMOTIDINE IN NACL 20-0.9 MG/50ML-% IV SOLN
20.0000 mg | Freq: Once | INTRAVENOUS | Status: AC
  Administered 2023-06-08: 20 mg via INTRAVENOUS
  Filled 2023-06-08: qty 50

## 2023-06-08 MED ORDER — IMMUNE GLOBULIN (HUMAN) 40 GM/400ML IV SOLN: 40.0000 g | Freq: Once | INTRAVENOUS | Status: DC

## 2023-06-08 MED ORDER — SODIUM CHLORIDE 0.9% FLUSH
10.0000 mL | INTRAVENOUS | Status: DC | PRN
Start: 2023-06-08 — End: 2023-06-08
  Administered 2023-06-08: 10 mL via INTRAVENOUS

## 2023-06-08 MED ORDER — HEPARIN SOD (PORK) LOCK FLUSH 100 UNIT/ML IV SOLN
250.0000 [IU] | Freq: Once | INTRAVENOUS | Status: AC | PRN
  Administered 2023-06-08: 500 [IU]

## 2023-06-08 NOTE — Patient Instructions (Signed)

## 2023-06-08 NOTE — Patient Instructions (Signed)
 Immune Globulin Injection What is this medication? IMMUNE GLOBULIN (im MUNE GLOB yoo lin) treats many immune system conditions. It works by Designer, multimedia extra antibodies. Antibodies are proteins made by the immune system that help protect the body. This medicine may be used for other purposes; ask your health care provider or pharmacist if you have questions. COMMON BRAND NAME(S): ASCENIV, Baygam, BIVIGAM, Carimune, Carimune NF, cutaquig, Cuvitru, Flebogamma, Flebogamma DIF, GamaSTAN, GamaSTAN S/D, Gamimune N, Gammagard, Gammagard S/D, Gammaked, Gammaplex, Gammar-P IV, Gamunex, Gamunex-C, Hizentra, Iveegam, Iveegam EN, Octagam, Panglobulin, Panglobulin NF, panzyga, Polygam S/D, Privigen, Sandoglobulin, Venoglobulin-S, Vigam, Vivaglobulin, Xembify What should I tell my care team before I take this medication? They need to know if you have any of these conditions: Blood clotting disorder Condition where you have excess fluid in your body, such as heart failure or edema Dehydration Diabetes Have had blood clots Heart disease Immune system conditions Kidney disease Low levels of IgA Recent or upcoming vaccine An unusual or allergic reaction to immune globulin, other medications, foods, dyes, or preservatives Pregnant or trying to get pregnant Breastfeeding How should I use this medication? This medication is infused into a vein or under the skin. It is usually given by your care team in a hospital or clinic setting. It may also be given at home. If you get this medication at home, you will be taught how to prepare and give it. Use exactly as directed. Take it as directed on the prescription label at the same time every day. Keep taking it unless your care team tells you to stop. It is important that you put your used needles and syringes in a special sharps container. Do not put them in a trash can. If you do not have a sharps container, call your pharmacist or care team to get one. Talk to  your care team about the use of this medication in children. While it may be given to children for selected conditions, precautions do apply. Overdosage: If you think you have taken too much of this medicine contact a poison control center or emergency room at once. NOTE: This medicine is only for you. Do not share this medicine with others. What if I miss a dose? If you get this medication at the hospital or clinic: It is important not to miss your dose. Call your care team if you are unable to keep an appointment. If you give yourself this medication at home: If you miss a dose, take it as soon as you can. Then continue your normal schedule. If it is almost time for your next dose, take only that dose. Do not take double or extra doses. Call your care team with questions. What may interact with this medication? Live virus vaccines This list may not describe all possible interactions. Give your health care provider a list of all the medicines, herbs, non-prescription drugs, or dietary supplements you use. Also tell them if you smoke, drink alcohol, or use illegal drugs. Some items may interact with your medicine. What should I watch for while using this medication? Your condition will be monitored carefully while you are receiving this medication. Tell your care team if your symptoms do not start to get better or if they get worse. You may need blood work done while you are taking this medication. This medication increases the risk of blood clots. People with heart, blood vessel, or blood clotting conditions are more likely to develop a blood clot. Other risk factors include advanced age, estrogen  use, tobacco use, lack of movement, and being overweight. This medication can decrease the response to a vaccine. If you need to get vaccinated, tell your care team if you have received this medication within the last year. Extra booster doses may be needed. Talk to your care team to see if a different  vaccination schedule is needed. If you have diabetes, you may get a falsely elevated blood sugar reading. Talk to your care team about how to check your blood sugar while taking this medication. What side effects may I notice from receiving this medication? Side effects that you should report to your care team as soon as possible: Allergic reactions--skin rash, itching, hives, swelling of the face, lips, tongue, or throat Blood clot--pain, swelling, or warmth in the leg, shortness of breath, chest pain Fever, neck pain or stiffness, sensitivity to light, headache, nausea, vomiting, confusion, which may be signs of meningitis Hemolytic anemia--unusual weakness or fatigue, dizziness, headache, trouble breathing, dark urine, yellowing skin or eyes Kidney injury--decrease in the amount of urine, swelling of the ankles, hands, or feet Low sodium level--muscle weakness, fatigue, dizziness, headache, confusion Shortness of breath or trouble breathing, cough, unusual weakness or fatigue, blue skin or lips Side effects that usually do not require medical attention (report these to your care team if they continue or are bothersome): Chills Diarrhea Fever Headache Nausea This list may not describe all possible side effects. Call your doctor for medical advice about side effects. You may report side effects to FDA at 1-800-FDA-1088. Where should I keep my medication? Keep out of the reach of children and pets. You will be instructed on how to store this medication. Get rid of any unused medication after the expiration date. To get rid of medications that are no longer needed or have expired: Take the medication to a medication take-back program. Check with your pharmacy or law enforcement to find a location. If you cannot return the medication, ask your pharmacist or care team how to get rid of this medication safely. NOTE: This sheet is a summary. It may not cover all possible information. If you have  questions about this medicine, talk to your doctor, pharmacist, or health care provider.  2024 Elsevier/Gold Standard (2023-02-17 00:00:00)

## 2023-06-08 NOTE — Progress Notes (Signed)
 12 Hematology and Oncology Follow Up Visit  [name] [name] [id number] 02-23-1951 73 y.o. 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  He is here with his wife.   In general he has felt better with his IVIG infusions. He has gone a week longer than normal since his last infusion and has noticed that his fatigue is a bit worse.   No fever.  He has had no cough, SOB.  He has had no nausea or vomiting.  There has been no diarrhea.   No recent infections   His monoclonal studies again that we checked back in early Feb showed a monoclonal spike of 0.4 g/dL.  His IgM level was 328 mg/dL.  He has had a good appetite. Weight is stable  Overall, I would say his performance status is probably ECOG 1.    Wt Readings from Last 3 Encounters:  06/08/23 209 lb 12.8 oz (95.2 kg)  05/23/23 204 lb (92.5 kg)  04/27/23 204 lb (92.5 kg)     Medications:  Current Outpatient Medications:    furosemide (LASIX) 40 MG tablet, Take 40 mg by mouth daily as needed., Disp: , Rfl:    hydrALAZINE (APRESOLINE) 25 MG tablet, Take 1 tablet (25 mg total) by mouth 2 (two) times daily., Disp: 270 tablet, Rfl: 1   levothyroxine (SYNTHROID) 137 MCG tablet, Take 1 tablet (137 mcg total) by mouth daily before breakfast. Take 6 days a week only, Disp: , Rfl:    Melatonin 5 MG CAPS,  Take by mouth at bedtime as needed., Disp: , Rfl:    Multiple Vitamins-Minerals (CENTRUM SILVER 50+MEN PO), Take by mouth daily., Disp: , Rfl:    rosuvastatin (CRESTOR) 20 MG tablet, TAKE 1 TABLET BY MOUTH EVERY DAY, Disp: 90 tablet, Rfl: 1   venlafaxine XR (EFFEXOR-XR) 37.5 MG 24 hr capsule, TAKE 1 CAPSULE BY MOUTH DAILY WITH BREAKFAST., Disp: 90 capsule, Rfl: 2   PEG 3350-KCl-NaCl-NaSulf-MgSul (SUFLAVE) 178.7 g SOLR, Take 1 kit by mouth as directed. (Patient not taking: Reported on 06/08/2023), Disp: 1 each, Rfl: 0  Allergies: No Known Allergies  Past Medical History, Surgical history, Social history, and Family History were reviewed and updated.  Review of Systems: Review of Systems  Constitutional: Negative.   HENT:  Negative.    Eyes: Negative.   Respiratory: Negative.    Cardiovascular: Negative.   Gastrointestinal:  Positive for nausea (mild occasional). Negative for abdominal pain.  Endocrine: Negative.   Genitourinary: Negative.    Musculoskeletal: Negative.   Skin: Negative.   Neurological: Negative.   Hematological: Negative.   Psychiatric/Behavioral: Negative.      Physical Exam: Vitals:   06/08/23 0943  BP: 118/75  Pulse: 78  Resp: 18  Temp: 98.2 F (36.8 C)  SpO2: 97%     Wt Readings from Last 3 Encounters:  06/08/23 209 lb 12.8  oz (95.2 kg)  05/23/23 204 lb (92.5 kg)  04/27/23 204 lb (92.5 kg)    Physical Exam Vitals reviewed.  HENT:     Head: Normocephalic and atraumatic.  Eyes:     Pupils: Pupils are equal, round, and reactive to light.  Cardiovascular:     Rate and Rhythm: Normal rate and regular rhythm.     Heart sounds: Normal heart sounds.  Pulmonary:     Effort: Pulmonary effort is normal.     Breath sounds: Normal breath sounds.  Abdominal:     General: Bowel sounds are normal.     Palpations: Abdomen is soft.  Musculoskeletal:        General: No tenderness or deformity. Normal range of motion.     Cervical back: Normal range of  motion.  Lymphadenopathy:     Cervical: No cervical adenopathy.  Skin:    General: Skin is warm and dry.     Findings: No erythema or rash.  Neurological:     Mental Status: He is alert and oriented to person, place, and time.  Psychiatric:        Behavior: Behavior normal.        Thought Content: Thought content normal.        Judgment: Judgment normal.     Lab Results  Component Value Date   WBC 4.1 06/08/2023   HGB 11.7 (L) 06/08/2023   HCT 33.9 (L) 06/08/2023   MCV 97.4 06/08/2023   PLT 152 06/08/2023     Chemistry      Component Value Date/Time   NA 140 04/27/2023 0915   NA 138 12/06/2019 1501   NA 142 04/25/2016 0844   NA 140 10/19/2015 0903   K 3.7 04/27/2023 0915   K 4.1 04/25/2016 0844   K 4.1 10/19/2015 0903   CL 105 04/27/2023 0915   CL 102 04/25/2016 0844   CO2 27 04/27/2023 0915   CO2 29 04/25/2016 0844   CO2 25 10/19/2015 0903   BUN 25 (H) 04/27/2023 0915   BUN 20 12/06/2019 1501   BUN 14 04/25/2016 0844   BUN 21.6 10/19/2015 0903   CREATININE 1.50 (H) 04/27/2023 0915   CREATININE 1.1 04/25/2016 0844   CREATININE 1.3 10/19/2015 0903      Component Value Date/Time   CALCIUM 9.6 04/27/2023 0915   CALCIUM 9.6 04/25/2016 0844   CALCIUM 9.4 10/19/2015 0903   ALKPHOS 53 04/27/2023 0915   ALKPHOS 59 04/25/2016 0844   ALKPHOS 68 10/19/2015 0903   AST 20 04/27/2023 0915   AST 21 10/19/2015 0903   ALT 17 04/27/2023 0915   ALT 27 04/25/2016 0844   ALT 19 10/19/2015 0903   BILITOT 0.4 04/27/2023 0915   BILITOT 0.54 10/19/2015 0903     No diagnosis found.  Impression and Plan: [name] [name] is a 73 year old white male.  We treated him 10 years ago.  He had a marginal zone lymphoma.  He went into clinical remission.  When he initially presented, he had splenomegaly.  When he relapsed, he again had splenomegaly.  We subsequently treated him with Imbruvica and venetoclax.  He completed his treatments back in April. He has since completed 6 cycles of  Rituxan/Bendamustine.  He completed this back in March 2024.  Currently he is doing really well on IVIG and PRN Aranesp/IV iron.   He will get IVIG today. No aranesp needed Iron studies pending  RTC 5 weeks MD, port labs (CBC w/, CMP, IgG/IgA/IgM, light chains,  LDH, SPEP, ferritin, iron), IVIG +_Aranesp   Rushie Chestnut, PA-C 3/20/20259:50 AM

## 2023-06-09 ENCOUNTER — Encounter: Payer: Self-pay | Admitting: Gastroenterology

## 2023-06-09 ENCOUNTER — Ambulatory Visit: Payer: PPO | Admitting: Gastroenterology

## 2023-06-09 VITALS — BP 143/70 | HR 65 | Temp 97.3°F | Resp 15 | Ht 71.0 in | Wt 204.0 lb

## 2023-06-09 DIAGNOSIS — K648 Other hemorrhoids: Secondary | ICD-10-CM | POA: Diagnosis not present

## 2023-06-09 DIAGNOSIS — K644 Residual hemorrhoidal skin tags: Secondary | ICD-10-CM | POA: Diagnosis not present

## 2023-06-09 DIAGNOSIS — Z860101 Personal history of adenomatous and serrated colon polyps: Secondary | ICD-10-CM | POA: Diagnosis not present

## 2023-06-09 DIAGNOSIS — K5730 Diverticulosis of large intestine without perforation or abscess without bleeding: Secondary | ICD-10-CM | POA: Diagnosis not present

## 2023-06-09 DIAGNOSIS — Z860100 Personal history of colon polyps, unspecified: Secondary | ICD-10-CM

## 2023-06-09 DIAGNOSIS — I10 Essential (primary) hypertension: Secondary | ICD-10-CM | POA: Diagnosis not present

## 2023-06-09 DIAGNOSIS — E785 Hyperlipidemia, unspecified: Secondary | ICD-10-CM | POA: Diagnosis not present

## 2023-06-09 DIAGNOSIS — E039 Hypothyroidism, unspecified: Secondary | ICD-10-CM | POA: Diagnosis not present

## 2023-06-09 DIAGNOSIS — Z1211 Encounter for screening for malignant neoplasm of colon: Secondary | ICD-10-CM

## 2023-06-09 LAB — KAPPA/LAMBDA LIGHT CHAINS
Kappa free light chain: 38.9 mg/L — ABNORMAL HIGH (ref 3.3–19.4)
Kappa, lambda light chain ratio: 5.33 — ABNORMAL HIGH (ref 0.26–1.65)
Lambda free light chains: 7.3 mg/L (ref 5.7–26.3)

## 2023-06-09 LAB — IGG, IGA, IGM
IgA: 74 mg/dL (ref 61–437)
IgG (Immunoglobin G), Serum: 736 mg/dL (ref 603–1613)
IgM (Immunoglobulin M), Srm: 314 mg/dL — ABNORMAL HIGH (ref 20–172)

## 2023-06-09 MED ORDER — SODIUM CHLORIDE 0.9 % IV SOLN
500.0000 mL | Freq: Once | INTRAVENOUS | Status: DC
Start: 2023-06-09 — End: 2023-06-09

## 2023-06-09 NOTE — Patient Instructions (Signed)
 Resume previous diet and medications. No repeat Colonoscopy due to age. Follow up as needed.  YOU HAD AN ENDOSCOPIC PROCEDURE TODAY AT THE [facility]:   Refer to the procedure report that was given to you for any specific questions about what was found during the examination.  If the procedure report does not answer your questions, please call your gastroenterologist to clarify.  If you requested that your care partner not be given the details of your procedure findings, then the procedure report has been included in a sealed envelope for you to review at your convenience later.  YOU SHOULD EXPECT: Some feelings of bloating in the abdomen. Passage of more gas than usual.  Walking can help get rid of the air that was put into your GI tract during the procedure and reduce the bloating. If you had a lower endoscopy (such as a colonoscopy or flexible sigmoidoscopy) you may notice spotting of blood in your stool or on the toilet paper. If you underwent a bowel prep for your procedure, you may not have a normal bowel movement for a few days.  Please Note:  You might notice some irritation and congestion in your nose or some drainage.  This is from the oxygen used during your procedure.  There is no need for concern and it should clear up in a day or so.  SYMPTOMS TO REPORT IMMEDIATELY:  Following lower endoscopy (colonoscopy or flexible sigmoidoscopy):  Excessive amounts of blood in the stool  Significant tenderness or worsening of abdominal pains  Swelling of the abdomen that is new, acute  Fever of 100F or higher  For urgent or emergent issues, a gastroenterologist can be reached at any hour by calling [phone]. Do not use MyChart messaging for urgent concerns.    DIET:  We do recommend a small meal at first, but then you may proceed to your regular diet.  Drink plenty of fluids but you should avoid alcoholic beverages for 24 hours.  ACTIVITY:  You should plan to take it easy  for the rest of today and you should NOT DRIVE or use heavy machinery until tomorrow (because of the sedation medicines used during the test).    FOLLOW UP: Our staff will call the number listed on your records the next business day following your procedure.  We will call around 7:15- 8:00 am to check on you and address any questions or concerns that you may have regarding the information given to you following your procedure. If we do not reach you, we will leave a message.     If any biopsies were taken you will be contacted by phone or by letter within the next 1-3 weeks.  Please call us at [phone] if you have not heard about the biopsies in 3 weeks.    SIGNATURES/CONFIDENTIALITY: You and/or your care partner have signed paperwork which will be entered into your electronic medical record.  These signatures attest to the fact that that the information above on your After Visit Summary has been reviewed and is understood.  Full responsibility of the confidentiality of this discharge information lies with you and/or your care-partner.

## 2023-06-09 NOTE — Op Note (Signed)
 [facility] Patient Name: [name] [name] Procedure Date: 06/09/2023 11:23 AM MRN: [id number] Endoscopist: Napoleon Form , MD, [id number] Age: 73 Referring MD:  Date of Birth: 08-25-1950 Gender: Male Account #: [id number] Procedure:                Colonoscopy Indications:              High risk colon cancer surveillance: Personal                            history of adenoma less than 10 mm in size Medicines:                Monitored Anesthesia Care Procedure:                Pre-Anesthesia Assessment:                           - Prior to the procedure, a History and Physical                            was performed, and patient medications and                            allergies were reviewed. The patient's tolerance of                            previous anesthesia was also reviewed. The risks                            and benefits of the procedure and the sedation                            options and risks were discussed with the patient.                            All questions were answered, and informed consent                            was obtained. Prior Anticoagulants: The patient has                            taken no anticoagulant or antiplatelet agents. ASA                            Grade Assessment: III - A patient with severe                            systemic disease. After reviewing the risks and                            benefits, the patient was deemed in satisfactory                            condition to undergo the procedure.  After obtaining informed consent, the colonoscope                            was passed under direct vision. Throughout the                            procedure, the patient's blood pressure, pulse, and                            oxygen saturations were monitored continuously. The                            PCF-HQ190L Colonoscope [id number] was introduced                            through the  anus and advanced to the the cecum,                            identified by appendiceal orifice and ileocecal                            valve. The colonoscopy was performed without                            difficulty. The patient tolerated the procedure                            well. The quality of the bowel preparation was                            good. The ileocecal valve, appendiceal orifice, and                            rectum were photographed. Scope In: 11:33:28 AM Scope Out: 11:46:10 AM Scope Withdrawal Time: 0 hours 8 minutes 22 seconds  Total Procedure Duration: 0 hours 12 minutes 42 seconds  Findings:                 The perianal and digital rectal examinations were                            normal.                           Scattered large-mouthed, medium-mouthed and                            small-mouthed diverticula were found in the sigmoid                            colon and descending colon.                           Non-bleeding external and internal hemorrhoids were  found during retroflexion. The hemorrhoids were                            large. Complications:            No immediate complications. Estimated Blood Loss:     Estimated blood loss was minimal. Impression:               - Diverticulosis in the sigmoid colon and in the                            descending colon.                           - Non-bleeding external and internal hemorrhoids.                           - No specimens collected. Recommendation:           - Patient has a contact number available for                            emergencies. The signs and symptoms of potential                            delayed complications were discussed with the                            patient. Return to normal activities tomorrow.                            Written discharge instructions were provided to the                            patient.                            - Resume previous diet.                           - Continue present medications.                           - No repeat colonoscopy due to age. Napoleon Form, MD 06/09/2023 11:54:17 AM This report has been signed electronically.

## 2023-06-09 NOTE — Progress Notes (Signed)
 Pt A/O x 3, gd SR's, pleased with anesthesia, report to RN

## 2023-06-09 NOTE — Progress Notes (Signed)
 Pt's states no medical or surgical changes since previsit or office visit.

## 2023-06-09 NOTE — Progress Notes (Signed)
 [facility] History and Physical   Primary Care Physician:  Pincus Sanes, MD   Reason for Procedure:  History of adenomatous colon polyps  Plan:    Surveillance colonoscopy with possible interventions as needed     HPI: [name] [name] is a very pleasant 73 y.o. male here for surveillance colonoscopy. Denies any nausea, vomiting, abdominal pain, melena or bright red blood per rectum  The risks and benefits as well as alternatives of endoscopic procedure(s) have been discussed and reviewed. All questions answered. The patient agrees to proceed.    Past Medical History:  Diagnosis Date   Anemia of chronic renal failure, stage 3a (HCC) 07/14/2022   Arthritis    Hemorrhoids 2014   Hyperlipidemia    Hypertension 08/09/2019   Hypogammaglobulinemia (HCC) 03/02/2023   Marginal zone lymphoma of intra-abdominal lymph nodes (HCC) 01/27/2011   Thyroid disease 2007   hypothyroidism    Past Surgical History:  Procedure Laterality Date   COLONOSCOPY  2008   Dr Russella Dar   COLONOSCOPY W/ POLYPECTOMY  2014   Dr Russella Dar   IR BONE MARROW BIOPSY & ASPIRATION  06/27/2022   IR IMAGING GUIDED PORT INSERTION  01/26/2022   POLYPECTOMY     PORTACATH PLACEMENT  Dec 2012   REMOVED spring 2014   TONSILLECTOMY      Prior to Admission medications   Medication Sig Start Date End Date Taking? Authorizing Provider  hydrALAZINE (APRESOLINE) 25 MG tablet Take 1 tablet (25 mg total) by mouth 2 (two) times daily. 04/26/23  Yes [place], Peter M, MD  levothyroxine (SYNTHROID) 137 MCG tablet Take 1 tablet (137 mcg total) by mouth daily before breakfast. Take 6 days a week only 04/30/23  Yes Burns, Bobette Mo, MD  rosuvastatin (CRESTOR) 20 MG tablet TAKE 1 TABLET BY MOUTH EVERY DAY 04/17/23  Yes Burns, Bobette Mo, MD  venlafaxine XR (EFFEXOR-XR) 37.5 MG 24 hr capsule TAKE 1 CAPSULE BY MOUTH DAILY WITH BREAKFAST. 09/18/22  Yes Ennever, Rose Phi, MD  furosemide (LASIX) 40 MG tablet Take 40 mg by mouth daily as  needed. 05/04/20   [provider]  Melatonin 5 MG CAPS Take by mouth at bedtime as needed.    [provider]  Multiple Vitamins-Minerals (CENTRUM SILVER 50+MEN PO) Take by mouth daily.    [provider]    Current Outpatient Medications  Medication Sig Dispense Refill   hydrALAZINE (APRESOLINE) 25 MG tablet Take 1 tablet (25 mg total) by mouth 2 (two) times daily. 270 tablet 1   levothyroxine (SYNTHROID) 137 MCG tablet Take 1 tablet (137 mcg total) by mouth daily before breakfast. Take 6 days a week only     rosuvastatin (CRESTOR) 20 MG tablet TAKE 1 TABLET BY MOUTH EVERY DAY 90 tablet 1   venlafaxine XR (EFFEXOR-XR) 37.5 MG 24 hr capsule TAKE 1 CAPSULE BY MOUTH DAILY WITH BREAKFAST. 90 capsule 2   furosemide (LASIX) 40 MG tablet Take 40 mg by mouth daily as needed.     Melatonin 5 MG CAPS Take by mouth at bedtime as needed.     Multiple Vitamins-Minerals (CENTRUM SILVER 50+MEN PO) Take by mouth daily.     Current Facility-Administered Medications  Medication Dose Route Frequency Provider Last Rate Last Admin   0.9 %  sodium chloride infusion  500 mL Intravenous Once Napoleon Form, MD        Allergies as of 06/09/2023   (No Known Allergies)    Family History  Problem Relation Age  of Onset   Alzheimer's disease Mother    Heart attack Father 11   Diabetes Brother    COPD Brother    Dementia Brother    Stroke Brother    Hypertension Brother         X 3   Heart disease Paternal Grandfather        in 90s   Coronary artery disease Brother        CBAG/vavlve replacement   Cancer Neg Hx    Colon cancer Neg Hx    Colon polyps Neg Hx    Rectal cancer Neg Hx    Stomach cancer Neg Hx    Esophageal cancer Neg Hx     Social History   Socioeconomic History   Marital status: Married    Spouse name: Not on file   Number of children: 2   Years of education: Not on file   Highest education level: Some college, no degree  Occupational History    Occupation: Retired    Comment: IT sales professional  Tobacco Use   Smoking status: Never   Smokeless tobacco: Never   Tobacco comments:    NEVER USED TOBACCO  Vaping Use   Vaping status: Never Used  Substance and Sexual Activity   Alcohol use: Yes    Alcohol/week: 0.0 standard drinks of alcohol    Comment:  1-2 wine / week   Drug use: No   Sexual activity: Not on file  Other Topics Concern   Not on file  Social History Narrative   Exercise:  No regular exercise, active, yardwork   Social Drivers of Health   Financial Resource Strain: Low Risk  (04/23/2023)   Overall Financial Resource Strain (CARDIA)    Difficulty of Paying Living Expenses: Not very hard  Food Insecurity: No Food Insecurity (04/23/2023)   Hunger Vital Sign    Worried About Running Out of Food in the Last Year: Never true    Ran Out of Food in the Last Year: Never true  Transportation Needs: No Transportation Needs (04/23/2023)   PRAPARE - Administrator, Civil Service (Medical): No    Lack of Transportation (Non-Medical): No  Physical Activity: Unknown (04/23/2023)   Exercise Vital Sign    Days of Exercise per Week: 0 days    Minutes of Exercise per Session: Not on file  Stress: No Stress Concern Present (04/23/2023)   [employer] of Occupational Health - Occupational Stress Questionnaire    Feeling of Stress : Only a little  Social Connections: Moderately Integrated (04/23/2023)   Social Connection and Isolation Panel [NHANES]    Frequency of Communication with Friends and Family: Three times a week    Frequency of Social Gatherings with Friends and Family: Twice a week    Attends Religious Services: Never    Database administrator or Organizations: Yes    Attends Engineer, structural: More than 4 times per year    Marital Status: Married  Catering manager Violence: Not At Risk (12/04/2020)   Humiliation, Afraid, Rape, and Kick questionnaire    Fear of Current or Ex-Partner: No    Emotionally  Abused: No    Physically Abused: No    Sexually Abused: No    Review of Systems:  All other review of systems negative except as mentioned in the HPI.  Physical Exam: Vital signs in last 24 hours: BP (!) 152/87   Pulse 77   Temp (!) 97.3 F (36.3 C)   Ht  5\' 11"  (1.803 m)   Wt 204 lb (92.5 kg)   SpO2 99%   BMI 28.45 kg/m  General:   Alert, NAD Lungs:  Clear .   Heart:  Regular rate and rhythm Abdomen:  Soft, nontender and nondistended. Neuro/Psych:  Alert and cooperative. Normal mood and affect. A and O x 3  Reviewed labs, radiology imaging, old records and pertinent past GI work up  Patient is appropriate for planned procedure(s) and anesthesia in an ambulatory setting   K. Scherry Ran , MD [phone]

## 2023-06-12 ENCOUNTER — Telehealth: Payer: Self-pay

## 2023-06-12 NOTE — Telephone Encounter (Signed)
  Follow up Call-     06/09/2023   10:51 AM  Call back number  Post procedure Call Back phone  # [phone]  Permission to leave phone message Yes     Patient questions:  Do you have a fever, pain , or abdominal swelling? No. Pain Score  0 *  Have you tolerated food without any problems? Yes.    Have you been able to return to your normal activities? Yes.    Do you have any questions about your discharge instructions: Diet   No. Medications  No. Follow up visit  No.  Do you have questions or concerns about your Care? No.  Actions: * If pain score is 4 or above: No action needed, pain <4.

## 2023-06-13 ENCOUNTER — Other Ambulatory Visit (INDEPENDENT_AMBULATORY_CARE_PROVIDER_SITE_OTHER)

## 2023-06-13 ENCOUNTER — Encounter: Payer: Self-pay | Admitting: Internal Medicine

## 2023-06-13 DIAGNOSIS — E7849 Other hyperlipidemia: Secondary | ICD-10-CM

## 2023-06-13 DIAGNOSIS — E039 Hypothyroidism, unspecified: Secondary | ICD-10-CM

## 2023-06-13 DIAGNOSIS — R739 Hyperglycemia, unspecified: Secondary | ICD-10-CM

## 2023-06-13 LAB — LIPID PANEL
Cholesterol: 147 mg/dL (ref 0–200)
HDL: 46.3 mg/dL (ref >39.00)
LDL Cholesterol: 72 mg/dL (ref 0–99)
NonHDL: 101.13
Total CHOL/HDL Ratio: 3
Triglycerides: 147 mg/dL (ref 0.0–149.0)
VLDL: 29.4 mg/dL (ref 0.0–40.0)

## 2023-06-13 LAB — HEMOGLOBIN A1C: Hgb A1c MFr Bld: 5.6 % (ref 4.6–6.5)

## 2023-06-13 LAB — TSH: TSH: 2.27 u[IU]/mL (ref 0.35–5.50)

## 2023-06-13 NOTE — Addendum Note (Signed)
 Addended by: Neta Mends D on: 06/13/2023 08:53 AM   Modules accepted: Orders

## 2023-06-14 LAB — IMMUNOFIXATION REFLEX, SERUM
IgA: 75 mg/dL (ref 61–437)
IgG (Immunoglobin G), Serum: 760 mg/dL (ref 603–1613)
IgM (Immunoglobulin M), Srm: 318 mg/dL — ABNORMAL HIGH (ref 20–172)

## 2023-06-14 LAB — PROTEIN ELECTROPHORESIS, SERUM, WITH REFLEX
A/G Ratio: 1.2 (ref 0.7–1.7)
Albumin ELP: 3.5 g/dL (ref 2.9–4.4)
Alpha-1-Globulin: 0.2 g/dL (ref 0.0–0.4)
Alpha-2-Globulin: 0.9 g/dL (ref 0.4–1.0)
Beta Globulin: 0.9 g/dL (ref 0.7–1.3)
Gamma Globulin: 1 g/dL (ref 0.4–1.8)
Globulin, Total: 3 g/dL (ref 2.2–3.9)
M-Spike, %: 0.4 g/dL — ABNORMAL HIGH
SPEP Interpretation: 0
Total Protein ELP: 6.5 g/dL (ref 6.0–8.5)

## 2023-07-13 ENCOUNTER — Inpatient Hospital Stay: Attending: Hematology & Oncology

## 2023-07-13 ENCOUNTER — Inpatient Hospital Stay

## 2023-07-13 ENCOUNTER — Other Ambulatory Visit: Payer: Self-pay

## 2023-07-13 ENCOUNTER — Inpatient Hospital Stay: Admitting: Hematology & Oncology

## 2023-07-13 ENCOUNTER — Encounter: Payer: Self-pay | Admitting: Hematology & Oncology

## 2023-07-13 VITALS — BP 129/74 | HR 86 | Temp 98.0°F | Resp 17 | Ht 71.0 in | Wt 216.0 lb

## 2023-07-13 VITALS — BP 129/61 | HR 64

## 2023-07-13 DIAGNOSIS — E032 Hypothyroidism due to medicaments and other exogenous substances: Secondary | ICD-10-CM

## 2023-07-13 DIAGNOSIS — D508 Other iron deficiency anemias: Secondary | ICD-10-CM

## 2023-07-13 DIAGNOSIS — C8583 Other specified types of non-Hodgkin lymphoma, intra-abdominal lymph nodes: Secondary | ICD-10-CM

## 2023-07-13 DIAGNOSIS — D801 Nonfamilial hypogammaglobulinemia: Secondary | ICD-10-CM

## 2023-07-13 DIAGNOSIS — Z79899 Other long term (current) drug therapy: Secondary | ICD-10-CM | POA: Diagnosis not present

## 2023-07-13 DIAGNOSIS — D509 Iron deficiency anemia, unspecified: Secondary | ICD-10-CM | POA: Insufficient documentation

## 2023-07-13 DIAGNOSIS — N1831 Chronic kidney disease, stage 3a: Secondary | ICD-10-CM

## 2023-07-13 DIAGNOSIS — C8800 Waldenstrom macroglobulinemia not having achieved remission: Secondary | ICD-10-CM

## 2023-07-13 DIAGNOSIS — D631 Anemia in chronic kidney disease: Secondary | ICD-10-CM

## 2023-07-13 LAB — LACTATE DEHYDROGENASE: LDH: 165 U/L (ref 98–192)

## 2023-07-13 LAB — CMP (CANCER CENTER ONLY)
ALT: 19 U/L (ref 0–44)
AST: 22 U/L (ref 15–41)
Albumin: 4.3 g/dL (ref 3.5–5.0)
Alkaline Phosphatase: 49 U/L (ref 38–126)
Anion gap: 7 (ref 5–15)
BUN: 31 mg/dL — ABNORMAL HIGH (ref 8–23)
CO2: 29 mmol/L (ref 22–32)
Calcium: 9.4 mg/dL (ref 8.9–10.3)
Chloride: 102 mmol/L (ref 98–111)
Creatinine: 1.71 mg/dL — ABNORMAL HIGH (ref 0.61–1.24)
GFR, Estimated: 43 mL/min — ABNORMAL LOW (ref >60)
Glucose, Bld: 93 mg/dL (ref 70–99)
Potassium: 4.3 mmol/L (ref 3.5–5.1)
Sodium: 138 mmol/L (ref 135–145)
Total Bilirubin: 0.4 mg/dL (ref 0.0–1.2)
Total Protein: 6.7 g/dL (ref 6.5–8.1)

## 2023-07-13 LAB — CBC WITH DIFFERENTIAL (CANCER CENTER ONLY)
Abs Immature Granulocytes: 0.02 10*3/uL (ref 0.00–0.07)
Basophils Absolute: 0 10*3/uL (ref 0.0–0.1)
Basophils Relative: 1 %
Eosinophils Absolute: 0.1 10*3/uL (ref 0.0–0.5)
Eosinophils Relative: 2 %
HCT: 33.6 % — ABNORMAL LOW (ref 39.0–52.0)
Hemoglobin: 11.6 g/dL — ABNORMAL LOW (ref 13.0–17.0)
Immature Granulocytes: 1 %
Lymphocytes Relative: 23 %
Lymphs Abs: 1 10*3/uL (ref 0.7–4.0)
MCH: 34.3 pg — ABNORMAL HIGH (ref 26.0–34.0)
MCHC: 34.5 g/dL (ref 30.0–36.0)
MCV: 99.4 fL (ref 80.0–100.0)
Monocytes Absolute: 0.7 10*3/uL (ref 0.1–1.0)
Monocytes Relative: 16 %
Neutro Abs: 2.5 10*3/uL (ref 1.7–7.7)
Neutrophils Relative %: 57 %
Platelet Count: 167 10*3/uL (ref 150–400)
RBC: 3.38 MIL/uL — ABNORMAL LOW (ref 4.22–5.81)
RDW: 12.9 % (ref 11.5–15.5)
WBC Count: 4.4 10*3/uL (ref 4.0–10.5)
nRBC: 0 % (ref 0.0–0.2)

## 2023-07-13 LAB — IRON AND IRON BINDING CAPACITY (CC-WL,HP ONLY)
Iron: 123 ug/dL (ref 45–182)
Saturation Ratios: 41 % — ABNORMAL HIGH (ref 17.9–39.5)
TIBC: 301 ug/dL (ref 250–450)
UIBC: 178 ug/dL (ref 117–376)

## 2023-07-13 LAB — FERRITIN: Ferritin: 297 ng/mL (ref 24–336)

## 2023-07-13 MED ORDER — FAMOTIDINE IN NACL 20-0.9 MG/50ML-% IV SOLN
20.0000 mg | Freq: Once | INTRAVENOUS | Status: AC
Start: 2023-07-13 — End: 2023-07-13
  Administered 2023-07-13: 20 mg via INTRAVENOUS
  Filled 2023-07-13: qty 50

## 2023-07-13 MED ORDER — ACETAMINOPHEN 325 MG PO TABS
650.0000 mg | ORAL_TABLET | Freq: Once | ORAL | Status: AC
  Administered 2023-07-13: 650 mg via ORAL
  Filled 2023-07-13: qty 2

## 2023-07-13 MED ORDER — IMMUNE GLOBULIN (HUMAN) 40 GM/400ML IV SOLN
40.0000 g | Freq: Once | INTRAVENOUS | Status: AC
  Administered 2023-07-13: 40 g via INTRAVENOUS
  Filled 2023-07-13: qty 400

## 2023-07-13 MED ORDER — DEXTROSE 5 % IV SOLN: INTRAVENOUS | Status: DC

## 2023-07-13 NOTE — Progress Notes (Signed)
 12 Hematology and Oncology Follow Up Visit  [name] [name] [id number] 12-21-1950 73 y.o. 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 is back for follow-up.  He is here with his wife.  So far, he has been doing pretty well.  However, his wife is little concerned that he has been having some falling episodes.  Not sure exactly what with the etiology of this would be.  He does not seem to be too worried about this.  He thinks he just loses his balance because of external factors.  I think this is something that their family doctor probably needs to evaluate if she feels it is important to do.  He is responding well to the IVIG.  He has had no problem with infections.  The IVIG is helping his immune system.  I think his last IgG level was 745 mg/dL.  His last monoclonal spike was 0.4 g/dL.  His IgM level was 316 mg/dL.  His last iron studies showed ferritin of 237 with an iron saturation of 37%.  He has had no fever.  He has had no obvious bleeding..  Had a little bit of swelling in the legs.  He has not been taking his diuretic.  Overall, I would say that his performance status is probably ECOG 1.    Wt Readings from Last 3 Encounters:  07/13/23 216 lb (98 kg)  06/09/23 204 lb (92.5 kg)  06/08/23 209 lb 12.8 oz (95.2 kg)     Medications:  Current Outpatient Medications:     furosemide (LASIX) 40 MG tablet, Take 40 mg by mouth daily as needed., Disp: , Rfl:    hydrALAZINE  (APRESOLINE ) 25 MG tablet, Take 1 tablet (25 mg total) by mouth 2 (two) times daily., Disp: 270 tablet, Rfl: 1   levothyroxine  (SYNTHROID ) 137 MCG tablet, Take 1 tablet (137 mcg total) by mouth daily before breakfast. Take 6 days a week only, Disp: , Rfl:    Melatonin 5 MG CAPS, Take by mouth at bedtime as needed., Disp: , Rfl:    Multiple Vitamins-Minerals (CENTRUM SILVER 50+MEN PO), Take by mouth daily., Disp: , Rfl:    rosuvastatin  (CRESTOR ) 20 MG tablet, TAKE 1 TABLET BY MOUTH EVERY DAY, Disp: 90 tablet, Rfl: 1   venlafaxine  XR (EFFEXOR -XR) 37.5 MG 24 hr capsule, TAKE 1 CAPSULE BY MOUTH DAILY WITH BREAKFAST., Disp: 90 capsule, Rfl: 2  Allergies: No Known Allergies  Past Medical History, Surgical history, Social history, and Family History were reviewed and updated.  Review of Systems: Review of Systems  Constitutional: Negative.   HENT:  Negative.    Eyes: Negative.   Respiratory: Negative.    Cardiovascular: Negative.   Gastrointestinal:  Positive for nausea (mild occasional). Negative for abdominal pain.  Endocrine: Negative.   Genitourinary: Negative.    Musculoskeletal: Negative.   Skin: Negative.   Neurological: Negative.   Hematological:  Negative.   Psychiatric/Behavioral: Negative.      Physical Exam: Vitals:   07/13/23 1002  BP: 129/74  Pulse: 86  Resp: 17  Temp: 98 F (36.7 C)  SpO2: 96%     Wt Readings from Last 3 Encounters:  07/13/23 216 lb (98 kg)  06/09/23 204 lb (92.5 kg)  06/08/23 209 lb 12.8 oz (95.2 kg)    Physical Exam Vitals reviewed.  HENT:     Head: Normocephalic and atraumatic.  Eyes:     Pupils: Pupils are equal, round, and reactive to light.  Cardiovascular:     Rate and Rhythm: Normal rate and regular rhythm.     Heart sounds: Normal heart sounds.  Pulmonary:     Effort: Pulmonary effort is normal.     Breath sounds: Normal  breath sounds.  Abdominal:     General: Bowel sounds are normal.     Palpations: Abdomen is soft.  Musculoskeletal:        General: No tenderness or deformity. Normal range of motion.     Cervical back: Normal range of motion.  Lymphadenopathy:     Cervical: No cervical adenopathy.  Skin:    General: Skin is warm and dry.     Findings: No erythema or rash.  Neurological:     Mental Status: He is alert and oriented to person, place, and time.  Psychiatric:        Behavior: Behavior normal.        Thought Content: Thought content normal.        Judgment: Judgment normal.    Lab Results  Component Value Date   WBC 4.4 07/13/2023   HGB 11.6 (L) 07/13/2023   HCT 33.6 (L) 07/13/2023   MCV 99.4 07/13/2023   PLT 167 07/13/2023     Chemistry      Component Value Date/Time   NA 138 07/13/2023 0912   NA 138 12/06/2019 1501   NA 142 04/25/2016 0844   NA 140 10/19/2015 0903   K 4.3 07/13/2023 0912   K 4.1 04/25/2016 0844   K 4.1 10/19/2015 0903   CL 102 07/13/2023 0912   CL 102 04/25/2016 0844   CO2 29 07/13/2023 0912   CO2 29 04/25/2016 0844   CO2 25 10/19/2015 0903   BUN 31 (H) 07/13/2023 0912   BUN 20 12/06/2019 1501   BUN 14 04/25/2016 0844   BUN 21.6 10/19/2015 0903   CREATININE 1.71 (H) 07/13/2023 0912   CREATININE 1.1 04/25/2016 0844   CREATININE 1.3 10/19/2015 0903      Component Value Date/Time   CALCIUM  9.4 07/13/2023 0912   CALCIUM  9.6 04/25/2016 0844   CALCIUM  9.4 10/19/2015 0903   ALKPHOS 49 07/13/2023 0912   ALKPHOS 59 04/25/2016 0844   ALKPHOS 68 10/19/2015 0903   AST 22 07/13/2023 0912   AST 21 10/19/2015 0903   ALT 19 07/13/2023 0912   ALT 27 04/25/2016 0844   ALT 19 10/19/2015 0903   BILITOT 0.4 07/13/2023 0912   BILITOT 0.54 10/19/2015 0903       Impression and Plan: [name] is a 73 year old white male.  We treated him 10 years ago.  He had a marginal zone lymphoma.  He went into clinical remission.  When he initially presented, he had  splenomegaly.  When he relapsed, he again had splenomegaly.  We subsequently treated him with Imbruvica  and venetoclax .  He completed his treatments back in April. He has since completed 6 cycles of Rituxan /Bendamustine .  He completed this back in March 2024.  Currently he is doing really well on IVIG and PRN Aranesp /IV iron.   He will get IVIG today.  Hopefully, we will be able to move his IVIG treatments little longer now that the weather is doing okay and that his IgG level is holding on.  I think we can now begin him back after [facility] day.  If all looks good at that point, then we might be able to get him through most of the Summer.     Ivor Mars, MD 4/24/202510:15 AM

## 2023-07-13 NOTE — Patient Instructions (Signed)

## 2023-07-13 NOTE — Patient Instructions (Signed)
 Immune Globulin Injection What is this medication? IMMUNE GLOBULIN (im MUNE GLOB yoo lin) treats many immune system conditions. It works by Designer, multimedia extra antibodies. Antibodies are proteins made by the immune system that help protect the body. This medicine may be used for other purposes; ask your health care provider or pharmacist if you have questions. COMMON BRAND NAME(S): ASCENIV, Baygam, BIVIGAM, Carimune, Carimune NF, cutaquig, Cuvitru, Flebogamma, Flebogamma DIF, GamaSTAN, GamaSTAN S/D, Gamimune N, Gammagard, Gammagard S/D, Gammaked, Gammaplex, Gammar-P IV, Gamunex, Gamunex-C, Hizentra, Iveegam, Iveegam EN, Octagam, Panglobulin, Panglobulin NF, panzyga, Polygam S/D, Privigen, Sandoglobulin, Venoglobulin-S, Vigam, Vivaglobulin, Xembify What should I tell my care team before I take this medication? They need to know if you have any of these conditions: Blood clotting disorder Condition where you have excess fluid in your body, such as heart failure or edema Dehydration Diabetes Have had blood clots Heart disease Immune system conditions Kidney disease Low levels of IgA Recent or upcoming vaccine An unusual or allergic reaction to immune globulin, other medications, foods, dyes, or preservatives Pregnant or trying to get pregnant Breastfeeding How should I use this medication? This medication is infused into a vein or under the skin. It is usually given by your care team in a hospital or clinic setting. It may also be given at home. If you get this medication at home, you will be taught how to prepare and give it. Use exactly as directed. Take it as directed on the prescription label at the same time every day. Keep taking it unless your care team tells you to stop. It is important that you put your used needles and syringes in a special sharps container. Do not put them in a trash can. If you do not have a sharps container, call your pharmacist or care team to get one. Talk to  your care team about the use of this medication in children. While it may be given to children for selected conditions, precautions do apply. Overdosage: If you think you have taken too much of this medicine contact a poison control center or emergency room at once. NOTE: This medicine is only for you. Do not share this medicine with others. What if I miss a dose? If you get this medication at the hospital or clinic: It is important not to miss your dose. Call your care team if you are unable to keep an appointment. If you give yourself this medication at home: If you miss a dose, take it as soon as you can. Then continue your normal schedule. If it is almost time for your next dose, take only that dose. Do not take double or extra doses. Call your care team with questions. What may interact with this medication? Live virus vaccines This list may not describe all possible interactions. Give your health care provider a list of all the medicines, herbs, non-prescription drugs, or dietary supplements you use. Also tell them if you smoke, drink alcohol, or use illegal drugs. Some items may interact with your medicine. What should I watch for while using this medication? Your condition will be monitored carefully while you are receiving this medication. Tell your care team if your symptoms do not start to get better or if they get worse. You may need blood work done while you are taking this medication. This medication increases the risk of blood clots. People with heart, blood vessel, or blood clotting conditions are more likely to develop a blood clot. Other risk factors include advanced age, estrogen  use, tobacco use, lack of movement, and being overweight. This medication can decrease the response to a vaccine. If you need to get vaccinated, tell your care team if you have received this medication within the last year. Extra booster doses may be needed. Talk to your care team to see if a different  vaccination schedule is needed. If you have diabetes, you may get a falsely elevated blood sugar reading. Talk to your care team about how to check your blood sugar while taking this medication. What side effects may I notice from receiving this medication? Side effects that you should report to your care team as soon as possible: Allergic reactions--skin rash, itching, hives, swelling of the face, lips, tongue, or throat Blood clot--pain, swelling, or warmth in the leg, shortness of breath, chest pain Fever, neck pain or stiffness, sensitivity to light, headache, nausea, vomiting, confusion, which may be signs of meningitis Hemolytic anemia--unusual weakness or fatigue, dizziness, headache, trouble breathing, dark urine, yellowing skin or eyes Kidney injury--decrease in the amount of urine, swelling of the ankles, hands, or feet Low sodium level--muscle weakness, fatigue, dizziness, headache, confusion Shortness of breath or trouble breathing, cough, unusual weakness or fatigue, blue skin or lips Side effects that usually do not require medical attention (report these to your care team if they continue or are bothersome): Chills Diarrhea Fever Headache Nausea This list may not describe all possible side effects. Call your doctor for medical advice about side effects. You may report side effects to FDA at 1-800-FDA-1088. Where should I keep my medication? Keep out of the reach of children and pets. You will be instructed on how to store this medication. Get rid of any unused medication after the expiration date. To get rid of medications that are no longer needed or have expired: Take the medication to a medication take-back program. Check with your pharmacy or law enforcement to find a location. If you cannot return the medication, ask your pharmacist or care team how to get rid of this medication safely. NOTE: This sheet is a summary. It may not cover all possible information. If you have  questions about this medicine, talk to your doctor, pharmacist, or health care provider.  2024 Elsevier/Gold Standard (2023-02-17 00:00:00)

## 2023-07-14 LAB — IGG, IGA, IGM
IgA: 75 mg/dL (ref 61–437)
IgG (Immunoglobin G), Serum: 873 mg/dL (ref 603–1613)
IgM (Immunoglobulin M), Srm: 323 mg/dL — ABNORMAL HIGH (ref 20–172)

## 2023-07-14 LAB — KAPPA/LAMBDA LIGHT CHAINS
Kappa free light chain: 44 mg/L — ABNORMAL HIGH (ref 3.3–19.4)
Kappa, lambda light chain ratio: 5.12 — ABNORMAL HIGH (ref 0.26–1.65)
Lambda free light chains: 8.6 mg/L (ref 5.7–26.3)

## 2023-07-16 ENCOUNTER — Other Ambulatory Visit: Payer: Self-pay | Admitting: Internal Medicine

## 2023-07-16 ENCOUNTER — Other Ambulatory Visit: Payer: Self-pay | Admitting: Hematology & Oncology

## 2023-07-17 LAB — PROTEIN ELECTROPHORESIS, SERUM
A/G Ratio: 1.1 (ref 0.7–1.7)
Albumin ELP: 3.5 g/dL (ref 2.9–4.4)
Alpha-1-Globulin: 0.2 g/dL (ref 0.0–0.4)
Alpha-2-Globulin: 1 g/dL (ref 0.4–1.0)
Beta Globulin: 0.8 g/dL (ref 0.7–1.3)
Gamma Globulin: 1.1 g/dL (ref 0.4–1.8)
Globulin, Total: 3.1 g/dL (ref 2.2–3.9)
M-Spike, %: 0.4 g/dL — ABNORMAL HIGH
Total Protein ELP: 6.6 g/dL (ref 6.0–8.5)

## 2023-08-22 DIAGNOSIS — N1832 Chronic kidney disease, stage 3b: Secondary | ICD-10-CM | POA: Diagnosis not present

## 2023-08-28 DIAGNOSIS — C8580 Other specified types of non-Hodgkin lymphoma, unspecified site: Secondary | ICD-10-CM | POA: Diagnosis not present

## 2023-08-28 DIAGNOSIS — E875 Hyperkalemia: Secondary | ICD-10-CM | POA: Diagnosis not present

## 2023-08-28 DIAGNOSIS — I129 Hypertensive chronic kidney disease with stage 1 through stage 4 chronic kidney disease, or unspecified chronic kidney disease: Secondary | ICD-10-CM | POA: Diagnosis not present

## 2023-08-28 DIAGNOSIS — R809 Proteinuria, unspecified: Secondary | ICD-10-CM | POA: Diagnosis not present

## 2023-08-28 DIAGNOSIS — N1832 Chronic kidney disease, stage 3b: Secondary | ICD-10-CM | POA: Diagnosis not present

## 2023-08-31 ENCOUNTER — Inpatient Hospital Stay: Admitting: Hematology & Oncology

## 2023-08-31 ENCOUNTER — Inpatient Hospital Stay: Attending: Hematology & Oncology

## 2023-08-31 ENCOUNTER — Encounter: Payer: Self-pay | Admitting: Hematology & Oncology

## 2023-08-31 ENCOUNTER — Inpatient Hospital Stay

## 2023-08-31 VITALS — BP 119/66 | HR 97 | Temp 97.7°F | Resp 19 | Ht 71.0 in | Wt 218.8 lb

## 2023-08-31 DIAGNOSIS — N1831 Chronic kidney disease, stage 3a: Secondary | ICD-10-CM

## 2023-08-31 DIAGNOSIS — Z79899 Other long term (current) drug therapy: Secondary | ICD-10-CM | POA: Diagnosis not present

## 2023-08-31 DIAGNOSIS — D631 Anemia in chronic kidney disease: Secondary | ICD-10-CM

## 2023-08-31 DIAGNOSIS — E032 Hypothyroidism due to medicaments and other exogenous substances: Secondary | ICD-10-CM

## 2023-08-31 DIAGNOSIS — Z79620 Long term (current) use of immunosuppressive biologic: Secondary | ICD-10-CM | POA: Insufficient documentation

## 2023-08-31 DIAGNOSIS — D801 Nonfamilial hypogammaglobulinemia: Secondary | ICD-10-CM

## 2023-08-31 DIAGNOSIS — C8583 Other specified types of non-Hodgkin lymphoma, intra-abdominal lymph nodes: Secondary | ICD-10-CM

## 2023-08-31 LAB — CBC WITH DIFFERENTIAL (CANCER CENTER ONLY)
Abs Immature Granulocytes: 0.03 10*3/uL (ref 0.00–0.07)
Basophils Absolute: 0 10*3/uL (ref 0.0–0.1)
Basophils Relative: 1 %
Eosinophils Absolute: 0.1 10*3/uL (ref 0.0–0.5)
Eosinophils Relative: 2 %
HCT: 34.6 % — ABNORMAL LOW (ref 39.0–52.0)
Hemoglobin: 12 g/dL — ABNORMAL LOW (ref 13.0–17.0)
Immature Granulocytes: 1 %
Lymphocytes Relative: 21 %
Lymphs Abs: 1.1 10*3/uL (ref 0.7–4.0)
MCH: 34.3 pg — ABNORMAL HIGH (ref 26.0–34.0)
MCHC: 34.7 g/dL (ref 30.0–36.0)
MCV: 98.9 fL (ref 80.0–100.0)
Monocytes Absolute: 0.7 10*3/uL (ref 0.1–1.0)
Monocytes Relative: 14 %
Neutro Abs: 3.2 10*3/uL (ref 1.7–7.7)
Neutrophils Relative %: 61 %
Platelet Count: 172 10*3/uL (ref 150–400)
RBC: 3.5 MIL/uL — ABNORMAL LOW (ref 4.22–5.81)
RDW: 12.2 % (ref 11.5–15.5)
WBC Count: 5.1 10*3/uL (ref 4.0–10.5)
nRBC: 0 % (ref 0.0–0.2)

## 2023-08-31 LAB — LACTATE DEHYDROGENASE: LDH: 155 U/L (ref 98–192)

## 2023-08-31 LAB — CMP (CANCER CENTER ONLY)
ALT: 17 U/L (ref 0–44)
AST: 21 U/L (ref 15–41)
Albumin: 4.6 g/dL (ref 3.5–5.0)
Alkaline Phosphatase: 54 U/L (ref 38–126)
Anion gap: 7 (ref 5–15)
BUN: 28 mg/dL — ABNORMAL HIGH (ref 8–23)
CO2: 28 mmol/L (ref 22–32)
Calcium: 9.6 mg/dL (ref 8.9–10.3)
Chloride: 102 mmol/L (ref 98–111)
Creatinine: 1.64 mg/dL — ABNORMAL HIGH (ref 0.61–1.24)
GFR, Estimated: 45 mL/min — ABNORMAL LOW (ref >60)
Glucose, Bld: 121 mg/dL — ABNORMAL HIGH (ref 70–99)
Potassium: 4.1 mmol/L (ref 3.5–5.1)
Sodium: 137 mmol/L (ref 135–145)
Total Bilirubin: 0.4 mg/dL (ref 0.0–1.2)
Total Protein: 7.3 g/dL (ref 6.5–8.1)

## 2023-08-31 LAB — IRON AND IRON BINDING CAPACITY (CC-WL,HP ONLY)
Iron: 83 ug/dL (ref 45–182)
Saturation Ratios: 25 % (ref 17.9–39.5)
TIBC: 335 ug/dL (ref 250–450)
UIBC: 252 ug/dL

## 2023-08-31 LAB — RETICULOCYTES
Immature Retic Fract: 21.3 % — ABNORMAL HIGH (ref 2.3–15.9)
RBC.: 3.56 MIL/uL — ABNORMAL LOW (ref 4.22–5.81)
Retic Count, Absolute: 63.4 10*3/uL (ref 19.0–186.0)
Retic Ct Pct: 1.8 % (ref 0.4–3.1)

## 2023-08-31 LAB — TSH: TSH: 1.16 u[IU]/mL (ref 0.350–4.500)

## 2023-08-31 LAB — FERRITIN: Ferritin: 222 ng/mL (ref 24–336)

## 2023-08-31 MED ORDER — HEPARIN SOD (PORK) LOCK FLUSH 100 UNIT/ML IV SOLN
500.0000 [IU] | Freq: Once | INTRAVENOUS | Status: AC
  Administered 2023-08-31: 500 [IU] via INTRAVENOUS

## 2023-08-31 MED ORDER — SODIUM CHLORIDE 0.9% FLUSH
10.0000 mL | Freq: Once | INTRAVENOUS | Status: AC
  Administered 2023-08-31: 10 mL

## 2023-08-31 NOTE — Patient Instructions (Signed)

## 2023-08-31 NOTE — Progress Notes (Signed)
 12 Hematology and Oncology Follow Up Visit  [name] [name] [id number] [birth year] 73 y.o. 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  He is here with his wife.  So far, he has been doing pretty well.  Everything seems to going pretty well for him.  I do not think there is been problems with him falling.  He has been eating pretty good.  He has had no problems with nausea or vomiting.  He has had no problems with bowels or bladder.  On occasion, he does have little bit of swelling in the legs.  1 when I saw him, his ferritin was 297 with an iron saturation of 41%.  He has had no sinus issues.  When we last saw him, his IgG level was 873 mg/dL.  His IGM level was 323 mg/dL.  The Kappa light chain was 4.4 mg/dL.  He has been off therapy for the marginal zone lymphoma.  Again, I think he is done very nicely off therapy.  I think has been off therapy now for a little over a year.  Currently, I would have to say that his performance status is probably ECOG 1.    Wt Readings from Last 3 Encounters:  08/31/23 218 lb 12.8 oz (99.2 kg)  07/13/23 216 lb (98 kg)  06/09/23 204 lb (92.5 kg)     Medications:  Current Outpatient Medications:    furosemide (LASIX) 40 MG tablet, Take 40 mg by mouth daily as needed., Disp: , Rfl:    hydrALAZINE  (APRESOLINE ) 25  MG tablet, Take 1 tablet (25 mg total) by mouth 2 (two) times daily., Disp: 270 tablet, Rfl: 1   levothyroxine  (SYNTHROID ) 137 MCG tablet, TAKE 1 TABLET BY MOUTH EVERY DAY BEFORE BREAKFAST (Patient taking differently: Takes daily except on Tuesdays.), Disp: 90 tablet, Rfl: 1   Melatonin 5 MG CAPS, Take by mouth at bedtime as needed., Disp: , Rfl:    Multiple Vitamins-Minerals (CENTRUM SILVER 50+MEN PO), Take by mouth daily., Disp: , Rfl:    rosuvastatin  (CRESTOR ) 20 MG tablet, TAKE 1 TABLET BY MOUTH EVERY DAY, Disp: 90 tablet, Rfl: 1   venlafaxine  XR (EFFEXOR -XR) 37.5 MG 24 hr capsule, TAKE 1 CAPSULE BY MOUTH DAILY WITH BREAKFAST., Disp: 90 capsule, Rfl: 2  Allergies: No Known Allergies  Past Medical History, Surgical history, Social history, and Family History were reviewed and updated.  Review of Systems: Review of Systems  Constitutional: Negative.   HENT:  Negative.    Eyes: Negative.   Respiratory: Negative.    Cardiovascular: Negative.   Gastrointestinal:  Positive for nausea (mild occasional). Negative for abdominal pain.  Endocrine: Negative.   Genitourinary: Negative.    Musculoskeletal: Negative.   Skin: Negative.   Neurological: Negative.   Hematological: Negative.   Psychiatric/Behavioral: Negative.      Physical Exam: Vitals:   08/31/23 4034  BP: (!) 157/79  Pulse: 97  Resp: 19  Temp: 97.7 F (36.5 C)  SpO2: 98%     Wt Readings from Last 3 Encounters:  08/31/23 218 lb 12.8 oz (99.2 kg)  07/13/23 216 lb (98 kg)  06/09/23 204 lb (92.5 kg)    Physical Exam Vitals reviewed.  HENT:     Head: Normocephalic and atraumatic.   Eyes:     Pupils: Pupils are equal, round, and reactive to light.    Cardiovascular:     Rate and Rhythm: Normal rate and regular rhythm.     Heart sounds: Normal heart sounds.  Pulmonary:     Effort: Pulmonary effort is normal.     Breath sounds: Normal breath sounds.  Abdominal:     General: Bowel sounds are normal.      Palpations: Abdomen is soft.   Musculoskeletal:        General: No tenderness or deformity. Normal range of motion.     Cervical back: Normal range of motion.  Lymphadenopathy:     Cervical: No cervical adenopathy.   Skin:    General: Skin is warm and dry.     Findings: No erythema or rash.   Neurological:     Mental Status: He is alert and oriented to person, place, and time.   Psychiatric:        Behavior: Behavior normal.        Thought Content: Thought content normal.        Judgment: Judgment normal.    Lab Results  Component Value Date   WBC 5.1 08/31/2023   HGB 12.0 (L) 08/31/2023   HCT 34.6 (L) 08/31/2023   MCV 98.9 08/31/2023   PLT 172 08/31/2023     Chemistry      Component Value Date/Time   NA 137 08/31/2023 0844   NA 138 12/06/2019 1501   NA 142 04/25/2016 0844   NA 140 10/19/2015 0903   K 4.1 08/31/2023 0844   K 4.1 04/25/2016 0844   K 4.1 10/19/2015 0903   CL 102 08/31/2023 0844   CL 102 04/25/2016 0844   CO2 28 08/31/2023 0844   CO2 29 04/25/2016 0844   CO2 25 10/19/2015 0903   BUN 28 (H) 08/31/2023 0844   BUN 20 12/06/2019 1501   BUN 14 04/25/2016 0844   BUN 21.6 10/19/2015 0903   CREATININE 1.64 (H) 08/31/2023 0844   CREATININE 1.1 04/25/2016 0844   CREATININE 1.3 10/19/2015 0903      Component Value Date/Time   CALCIUM  9.6 08/31/2023 0844   CALCIUM  9.6 04/25/2016 0844   CALCIUM  9.4 10/19/2015 0903   ALKPHOS 54 08/31/2023 0844   ALKPHOS 59 04/25/2016 0844   ALKPHOS 68 10/19/2015 0903   AST 21 08/31/2023 0844   AST 21 10/19/2015 0903   ALT 17 08/31/2023 0844   ALT 27 04/25/2016 0844   ALT 19 10/19/2015 0903   BILITOT 0.4 08/31/2023 0844   BILITOT 0.54 10/19/2015 0903       Impression and Plan: [name] [name] is a 73 year old white male.  We treated him 10 years ago.  He had a marginal zone lymphoma.  He went into clinical remission.  When he initially presented, he had splenomegaly.  When he relapsed, he again had splenomegaly.  We  subsequently treated him with Imbruvica  and venetoclax .  He completed his treatments back in April. He has since completed 6 cycles of Rituxan /Bendamustine .  He completed this back in March 2024.  I  think we can hold off on the IVIG for right now.  Again he has done quite nicely with this.  I do think that his level probably is high enough that he does not need this or would benefit from this.  At this point, we will try to get him through most of The summer.  I will plan to get him back probably close to Labor Day weekend.   Ivor Mars, MD 6/12/20259:30 AM

## 2023-08-31 NOTE — Addendum Note (Signed)
 Addended by: Marne Sings on: 08/31/2023 10:16 AM   Modules accepted: Orders

## 2023-09-01 LAB — IGG, IGA, IGM
IgA: 77 mg/dL (ref 61–437)
IgG (Immunoglobin G), Serum: 800 mg/dL (ref 603–1613)
IgM (Immunoglobulin M), Srm: 327 mg/dL — ABNORMAL HIGH (ref 20–172)

## 2023-09-01 LAB — KAPPA/LAMBDA LIGHT CHAINS
Kappa free light chain: 38.4 mg/L — ABNORMAL HIGH (ref 3.3–19.4)
Kappa, lambda light chain ratio: 4.68 — ABNORMAL HIGH (ref 0.26–1.65)
Lambda free light chains: 8.2 mg/L (ref 5.7–26.3)

## 2023-09-07 LAB — PROTEIN ELECTROPHORESIS, SERUM, WITH REFLEX
A/G Ratio: 1 (ref 0.7–1.7)
Albumin ELP: 3.5 g/dL (ref 2.9–4.4)
Alpha-1-Globulin: 0.2 g/dL (ref 0.0–0.4)
Alpha-2-Globulin: 1.2 g/dL — ABNORMAL HIGH (ref 0.4–1.0)
Beta Globulin: 0.9 g/dL (ref 0.7–1.3)
Gamma Globulin: 1.1 g/dL (ref 0.4–1.8)
Globulin, Total: 3.4 g/dL (ref 2.2–3.9)
M-Spike, %: 0.5 g/dL — ABNORMAL HIGH
SPEP Interpretation: 0
Total Protein ELP: 6.9 g/dL (ref 6.0–8.5)

## 2023-09-07 LAB — IMMUNOFIXATION REFLEX, SERUM
IgA: 81 mg/dL (ref 61–437)
IgG (Immunoglobin G), Serum: 854 mg/dL (ref 603–1613)
IgM (Immunoglobulin M), Srm: 329 mg/dL — ABNORMAL HIGH (ref 20–172)

## 2023-10-18 ENCOUNTER — Other Ambulatory Visit: Payer: Self-pay | Admitting: Internal Medicine

## 2023-10-26 DIAGNOSIS — Z1283 Encounter for screening for malignant neoplasm of skin: Secondary | ICD-10-CM | POA: Diagnosis not present

## 2023-10-26 DIAGNOSIS — Z08 Encounter for follow-up examination after completed treatment for malignant neoplasm: Secondary | ICD-10-CM | POA: Diagnosis not present

## 2023-10-26 DIAGNOSIS — X32XXXD Exposure to sunlight, subsequent encounter: Secondary | ICD-10-CM | POA: Diagnosis not present

## 2023-10-26 DIAGNOSIS — L570 Actinic keratosis: Secondary | ICD-10-CM | POA: Diagnosis not present

## 2023-10-26 DIAGNOSIS — L820 Inflamed seborrheic keratosis: Secondary | ICD-10-CM | POA: Diagnosis not present

## 2023-10-26 DIAGNOSIS — Z85820 Personal history of malignant melanoma of skin: Secondary | ICD-10-CM | POA: Diagnosis not present

## 2023-10-26 DIAGNOSIS — D225 Melanocytic nevi of trunk: Secondary | ICD-10-CM | POA: Diagnosis not present

## 2023-11-02 ENCOUNTER — Inpatient Hospital Stay: Attending: Hematology & Oncology

## 2023-11-02 ENCOUNTER — Inpatient Hospital Stay

## 2023-11-02 ENCOUNTER — Encounter: Payer: Self-pay | Admitting: *Deleted

## 2023-11-02 ENCOUNTER — Ambulatory Visit: Payer: Self-pay | Admitting: Hematology & Oncology

## 2023-11-02 ENCOUNTER — Inpatient Hospital Stay (HOSPITAL_BASED_OUTPATIENT_CLINIC_OR_DEPARTMENT_OTHER): Admitting: Hematology & Oncology

## 2023-11-02 VITALS — BP 128/80 | HR 88 | Temp 98.9°F | Resp 18 | Ht 71.0 in | Wt 222.0 lb

## 2023-11-02 DIAGNOSIS — D801 Nonfamilial hypogammaglobulinemia: Secondary | ICD-10-CM

## 2023-11-02 DIAGNOSIS — C8583 Other specified types of non-Hodgkin lymphoma, intra-abdominal lymph nodes: Secondary | ICD-10-CM | POA: Diagnosis not present

## 2023-11-02 DIAGNOSIS — D631 Anemia in chronic kidney disease: Secondary | ICD-10-CM

## 2023-11-02 DIAGNOSIS — Z79899 Other long term (current) drug therapy: Secondary | ICD-10-CM | POA: Insufficient documentation

## 2023-11-02 LAB — RETICULOCYTES
Immature Retic Fract: 16.7 % — ABNORMAL HIGH (ref 2.3–15.9)
RBC.: 3.44 MIL/uL — ABNORMAL LOW (ref 4.22–5.81)
Retic Count, Absolute: 54.4 K/uL (ref 19.0–186.0)
Retic Ct Pct: 1.6 % (ref 0.4–3.1)

## 2023-11-02 LAB — CBC WITH DIFFERENTIAL (CANCER CENTER ONLY)
Abs Immature Granulocytes: 0.04 K/uL (ref 0.00–0.07)
Basophils Absolute: 0.1 K/uL (ref 0.0–0.1)
Basophils Relative: 1 %
Eosinophils Absolute: 0.1 K/uL (ref 0.0–0.5)
Eosinophils Relative: 2 %
HCT: 34.2 % — ABNORMAL LOW (ref 39.0–52.0)
Hemoglobin: 11.6 g/dL — ABNORMAL LOW (ref 13.0–17.0)
Immature Granulocytes: 1 %
Lymphocytes Relative: 21 %
Lymphs Abs: 1 K/uL (ref 0.7–4.0)
MCH: 33.2 pg (ref 26.0–34.0)
MCHC: 33.9 g/dL (ref 30.0–36.0)
MCV: 98 fL (ref 80.0–100.0)
Monocytes Absolute: 0.7 K/uL (ref 0.1–1.0)
Monocytes Relative: 15 %
Neutro Abs: 2.8 K/uL (ref 1.7–7.7)
Neutrophils Relative %: 60 %
Platelet Count: 161 K/uL (ref 150–400)
RBC: 3.49 MIL/uL — ABNORMAL LOW (ref 4.22–5.81)
RDW: 12.6 % (ref 11.5–15.5)
WBC Count: 4.7 K/uL (ref 4.0–10.5)
nRBC: 0 % (ref 0.0–0.2)

## 2023-11-02 LAB — CMP (CANCER CENTER ONLY)
ALT: 18 U/L (ref 0–44)
AST: 23 U/L (ref 15–41)
Albumin: 4.4 g/dL (ref 3.5–5.0)
Alkaline Phosphatase: 62 U/L (ref 38–126)
Anion gap: 12 (ref 5–15)
BUN: 20 mg/dL (ref 8–23)
CO2: 25 mmol/L (ref 22–32)
Calcium: 9.2 mg/dL (ref 8.9–10.3)
Chloride: 103 mmol/L (ref 98–111)
Creatinine: 1.55 mg/dL — ABNORMAL HIGH (ref 0.61–1.24)
GFR, Estimated: 48 mL/min — ABNORMAL LOW (ref >60)
Glucose, Bld: 92 mg/dL (ref 70–99)
Potassium: 4 mmol/L (ref 3.5–5.1)
Sodium: 140 mmol/L (ref 135–145)
Total Bilirubin: 0.4 mg/dL (ref 0.0–1.2)
Total Protein: 7.1 g/dL (ref 6.5–8.1)

## 2023-11-02 LAB — IRON AND IRON BINDING CAPACITY (CC-WL,HP ONLY)
Iron: 86 ug/dL (ref 45–182)
Saturation Ratios: 25 % (ref 17.9–39.5)
TIBC: 339 ug/dL (ref 250–450)
UIBC: 253 ug/dL

## 2023-11-02 LAB — FERRITIN: Ferritin: 145 ng/mL (ref 24–336)

## 2023-11-02 LAB — LACTATE DEHYDROGENASE: LDH: 171 U/L (ref 98–192)

## 2023-11-02 NOTE — Progress Notes (Signed)
 12 Hematology and Oncology Follow Up Visit  Saahir Prude [id number] 29-Jan-1951 73 y.o. 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 Joshuan Bolander is back for follow-up.  We last saw him back in June.  Since then, he has been doing quite well.  He really has had no complaints.  He has had no problems with nausea or vomiting.  He has had no cough.  He has had no problems with leg swelling.  He has had no change in bowel or bladder habits.  When we last saw him, his monoclonal spike was 0.5 g/dL.  His IgM level was 328 mg/dL.  The Kappa light chain was 3.8 mg/dL.  His iron studies when we last saw him showed a ferritin of 222 with an iron saturation of 25%.  He has had no bleeding.  He has had no fever.  He has had little bit of shortness of breath which is more chronic.  Overall, I would have said that his performance status is ECOG 1.       Wt Readings from Last 3 Encounters:  11/02/23 222 lb (100.7 kg)  08/31/23 218 lb 12.8 oz (99.2 kg)  07/13/23 216 lb (98 kg)     Medications:  Current Outpatient Medications:    furosemide (LASIX) 40 MG tablet, Take 40 mg by mouth daily as needed., Disp: , Rfl:    hydrALAZINE  (APRESOLINE ) 25 MG tablet, Take 1 tablet (25 mg total) by mouth 2 (two) times daily., Disp: 270 tablet, Rfl: 1   levothyroxine  (SYNTHROID ) 137 MCG tablet, TAKE 1 TABLET BY  MOUTH EVERY DAY BEFORE BREAKFAST (Patient taking differently: Takes daily except on Tuesdays.), Disp: 90 tablet, Rfl: 1   Melatonin 5 MG CAPS, Take by mouth at bedtime as needed., Disp: , Rfl:    Multiple Vitamins-Minerals (CENTRUM SILVER 50+MEN PO), Take by mouth daily., Disp: , Rfl:    rosuvastatin  (CRESTOR ) 20 MG tablet, TAKE 1 TABLET BY MOUTH EVERY DAY, Disp: 90 tablet, Rfl: 1   venlafaxine  XR (EFFEXOR -XR) 37.5 MG 24 hr capsule, TAKE 1 CAPSULE BY MOUTH DAILY WITH BREAKFAST., Disp: 90 capsule, Rfl: 2  Allergies: No Known Allergies  Past Medical History, Surgical history, Social history, and Family History were reviewed and updated.  Review of Systems: Review of Systems  Constitutional: Negative.   HENT:  Negative.    Eyes: Negative.   Respiratory: Negative.    Cardiovascular: Negative.   Gastrointestinal:  Positive for nausea (mild occasional). Negative for abdominal pain.  Endocrine: Negative.   Genitourinary: Negative.    Musculoskeletal: Negative.   Skin: Negative.   Neurological: Negative.   Hematological: Negative.   Psychiatric/Behavioral: Negative.      Physical Exam: Vitals:   11/02/23 0859  BP: 128/80  Pulse: 88  Resp: 18  Temp: 98.9 F (37.2 C)  SpO2: 96%     Wt Readings from Last 3 Encounters:  11/02/23 222 lb (100.7 kg)  08/31/23 218 lb 12.8 oz (99.2 kg)  07/13/23 216 lb (98 kg)    Physical Exam Vitals reviewed.  HENT:     Head: Normocephalic and atraumatic.  Eyes:     Pupils: Pupils are equal, round, and reactive to light.  Cardiovascular:     Rate and Rhythm: Normal rate and regular rhythm.     Heart sounds: Normal heart sounds.  Pulmonary:     Effort: Pulmonary effort is normal.     Breath sounds: Normal breath sounds.  Abdominal:     General: Bowel sounds are normal.     Palpations: Abdomen is soft.  Musculoskeletal:        General: No tenderness or deformity. Normal range of motion.     Cervical back: Normal range of motion.   Lymphadenopathy:     Cervical: No cervical adenopathy.  Skin:    General: Skin is warm and dry.     Findings: No erythema or rash.  Neurological:     Mental Status: He is alert and oriented to person, place, and time.  Psychiatric:        Behavior: Behavior normal.        Thought Content: Thought content normal.        Judgment: Judgment normal.     Lab Results  Component Value Date   WBC 4.7 11/02/2023   HGB 11.6 (L) 11/02/2023   HCT 34.2 (L) 11/02/2023   MCV 98.0 11/02/2023   PLT 161 11/02/2023     Chemistry      Component Value Date/Time   NA 137 08/31/2023 0844   NA 138 12/06/2019 1501   NA 142 04/25/2016 0844   NA 140 10/19/2015 0903   K 4.1 08/31/2023 0844   K 4.1 04/25/2016 0844   K 4.1 10/19/2015 0903   CL 102 08/31/2023 0844   CL 102 04/25/2016 0844   CO2 28 08/31/2023 0844   CO2 29 04/25/2016 0844   CO2 25 10/19/2015 0903   BUN 28 (H) 08/31/2023 0844   BUN 20 12/06/2019 1501   BUN 14 04/25/2016 0844   BUN 21.6 10/19/2015 0903   CREATININE 1.64 (H) 08/31/2023 0844   CREATININE 1.1 04/25/2016 0844   CREATININE 1.3 10/19/2015 0903      Component Value Date/Time   CALCIUM 9.6 08/31/2023 0844   CALCIUM 9.6 04/25/2016 0844   CALCIUM 9.4 10/19/2015 0903   ALKPHOS 54 08/31/2023 0844   ALKPHOS 59 04/25/2016 0844   ALKPHOS 68 10/19/2015 0903   AST 21 08/31/2023 0844   AST 21 10/19/2015 0903   ALT 17 08/31/2023 0844   ALT 27 04/25/2016 0844   ALT 19 10/19/2015 0903   BILITOT 0.4 08/31/2023 0844   BILITOT 0.54 10/19/2015 0903       Impression and Plan: Mr. Zakariya Knickerbocker is a 73 year old white male.  We treated him 10 years ago.  He had a marginal zone lymphoma.  He went into clinical remission.  When he initially presented, he had splenomegaly.  When he relapsed, he again had splenomegaly.  We subsequently treated him with Imbruvica and venetoclax.  He completed his treatments back in April. He has since completed 6 cycles of Rituxan/Bendamustine.  He  completed this back in March 2024.  I think we can hold off on the IVIG for right now.  I cannot imagine that his IgG level is all that low that he would require IVIG.  We will also see what his  iron studies are.  We will plan to get him back in a couple of months.  If all looks good at that time, then we will see about getting him through the Holiday season.    Maude JONELLE Crease, MD 8/14/20259:12 AM

## 2023-11-02 NOTE — Patient Instructions (Signed)

## 2023-11-03 LAB — KAPPA/LAMBDA LIGHT CHAINS
Kappa free light chain: 39.9 mg/L — ABNORMAL HIGH (ref 3.3–19.4)
Kappa, lambda light chain ratio: 4.16 — ABNORMAL HIGH (ref 0.26–1.65)
Lambda free light chains: 9.6 mg/L (ref 5.7–26.3)

## 2023-11-03 LAB — IGG, IGA, IGM
IgA: 66 mg/dL (ref 61–437)
IgG (Immunoglobin G), Serum: 588 mg/dL — ABNORMAL LOW (ref 603–1613)
IgM (Immunoglobulin M), Srm: 294 mg/dL — ABNORMAL HIGH (ref 15–143)

## 2023-11-05 ENCOUNTER — Other Ambulatory Visit: Payer: Self-pay | Admitting: Internal Medicine

## 2023-11-08 LAB — PROTEIN ELECTROPHORESIS, SERUM, WITH REFLEX
A/G Ratio: 1.2 (ref 0.7–1.7)
Albumin ELP: 3.6 g/dL (ref 2.9–4.4)
Alpha-1-Globulin: 0.2 g/dL (ref 0.0–0.4)
Alpha-2-Globulin: 1.1 g/dL — ABNORMAL HIGH (ref 0.4–1.0)
Beta Globulin: 0.9 g/dL (ref 0.7–1.3)
Gamma Globulin: 0.8 g/dL (ref 0.4–1.8)
Globulin, Total: 3 g/dL (ref 2.2–3.9)
M-Spike, %: 0.3 g/dL — ABNORMAL HIGH
SPEP Interpretation: 0
Total Protein ELP: 6.6 g/dL (ref 6.0–8.5)

## 2023-11-08 LAB — IMMUNOFIXATION REFLEX, SERUM
IgA: 75 mg/dL (ref 61–437)
IgG (Immunoglobin G), Serum: 616 mg/dL (ref 603–1613)
IgM (Immunoglobulin M), Srm: 310 mg/dL — ABNORMAL HIGH (ref 15–143)

## 2023-12-23 ENCOUNTER — Other Ambulatory Visit: Payer: Self-pay | Admitting: Internal Medicine

## 2024-01-04 ENCOUNTER — Inpatient Hospital Stay

## 2024-01-04 ENCOUNTER — Encounter: Payer: Self-pay | Admitting: Hematology & Oncology

## 2024-01-04 ENCOUNTER — Inpatient Hospital Stay: Admitting: Hematology & Oncology

## 2024-01-04 ENCOUNTER — Inpatient Hospital Stay: Attending: Hematology & Oncology

## 2024-01-04 ENCOUNTER — Other Ambulatory Visit: Payer: Self-pay

## 2024-01-04 VITALS — BP 145/83 | HR 62 | Resp 18

## 2024-01-04 VITALS — BP 150/89 | HR 91 | Temp 99.6°F | Resp 18 | Ht 71.0 in | Wt 226.0 lb

## 2024-01-04 DIAGNOSIS — C8583 Other specified types of non-Hodgkin lymphoma, intra-abdominal lymph nodes: Secondary | ICD-10-CM

## 2024-01-04 DIAGNOSIS — D801 Nonfamilial hypogammaglobulinemia: Secondary | ICD-10-CM | POA: Insufficient documentation

## 2024-01-04 DIAGNOSIS — D509 Iron deficiency anemia, unspecified: Secondary | ICD-10-CM | POA: Diagnosis not present

## 2024-01-04 LAB — CBC WITH DIFFERENTIAL (CANCER CENTER ONLY)
Abs Immature Granulocytes: 0.03 K/uL (ref 0.00–0.07)
Basophils Absolute: 0 K/uL (ref 0.0–0.1)
Basophils Relative: 1 %
Eosinophils Absolute: 0.1 K/uL (ref 0.0–0.5)
Eosinophils Relative: 3 %
HCT: 34.7 % — ABNORMAL LOW (ref 39.0–52.0)
Hemoglobin: 11.6 g/dL — ABNORMAL LOW (ref 13.0–17.0)
Immature Granulocytes: 1 %
Lymphocytes Relative: 21 %
Lymphs Abs: 1.1 K/uL (ref 0.7–4.0)
MCH: 33 pg (ref 26.0–34.0)
MCHC: 33.4 g/dL (ref 30.0–36.0)
MCV: 98.6 fL (ref 80.0–100.0)
Monocytes Absolute: 0.9 K/uL (ref 0.1–1.0)
Monocytes Relative: 17 %
Neutro Abs: 3.1 K/uL (ref 1.7–7.7)
Neutrophils Relative %: 57 %
Platelet Count: 184 K/uL (ref 150–400)
RBC: 3.52 MIL/uL — ABNORMAL LOW (ref 4.22–5.81)
RDW: 12.9 % (ref 11.5–15.5)
WBC Count: 5.3 K/uL (ref 4.0–10.5)
nRBC: 0 % (ref 0.0–0.2)

## 2024-01-04 LAB — CMP (CANCER CENTER ONLY)
ALT: 20 U/L (ref 0–44)
AST: 24 U/L (ref 15–41)
Albumin: 4.5 g/dL (ref 3.5–5.0)
Alkaline Phosphatase: 62 U/L (ref 38–126)
Anion gap: 12 (ref 5–15)
BUN: 27 mg/dL — ABNORMAL HIGH (ref 8–23)
CO2: 26 mmol/L (ref 22–32)
Calcium: 9.4 mg/dL (ref 8.9–10.3)
Chloride: 99 mmol/L (ref 98–111)
Creatinine: 1.65 mg/dL — ABNORMAL HIGH (ref 0.61–1.24)
GFR, Estimated: 44 mL/min — ABNORMAL LOW (ref >60)
Glucose, Bld: 95 mg/dL (ref 70–99)
Potassium: 4.1 mmol/L (ref 3.5–5.1)
Sodium: 138 mmol/L (ref 135–145)
Total Bilirubin: 0.4 mg/dL (ref 0.0–1.2)
Total Protein: 6.9 g/dL (ref 6.5–8.1)

## 2024-01-04 LAB — IRON AND IRON BINDING CAPACITY (CC-WL,HP ONLY)
Iron: 79 ug/dL (ref 45–182)
Saturation Ratios: 23 % (ref 17.9–39.5)
TIBC: 342 ug/dL (ref 250–450)
UIBC: 263 ug/dL

## 2024-01-04 LAB — FERRITIN: Ferritin: 145 ng/mL (ref 24–336)

## 2024-01-04 LAB — LACTATE DEHYDROGENASE: LDH: 181 U/L (ref 98–192)

## 2024-01-04 MED ORDER — HEPARIN SOD (PORK) LOCK FLUSH 100 UNIT/ML IV SOLN: 250.0000 [IU] | Freq: Once | INTRAVENOUS | Status: DC | PRN

## 2024-01-04 MED ORDER — ACETAMINOPHEN 325 MG PO TABS
650.0000 mg | ORAL_TABLET | Freq: Once | ORAL | Status: AC
  Administered 2024-01-04: 650 mg via ORAL
  Filled 2024-01-04: qty 2

## 2024-01-04 MED ORDER — IMMUNE GLOBULIN (HUMAN) 40 GM/400ML IV SOLN
40.0000 g | Freq: Once | INTRAVENOUS | Status: AC
  Administered 2024-01-04: 40 g via INTRAVENOUS
  Filled 2024-01-04: qty 400

## 2024-01-04 MED ORDER — DARBEPOETIN ALFA 300 MCG/0.6ML IJ SOSY: 300.0000 ug | PREFILLED_SYRINGE | Freq: Once | INTRAMUSCULAR | Status: DC

## 2024-01-04 MED ORDER — FAMOTIDINE IN NACL 20-0.9 MG/50ML-% IV SOLN
20.0000 mg | Freq: Once | INTRAVENOUS | Status: AC
  Administered 2024-01-04: 20 mg via INTRAVENOUS
  Filled 2024-01-04: qty 50

## 2024-01-04 MED ORDER — SODIUM CHLORIDE 0.9% FLUSH: 3.0000 mL | Freq: Once | INTRAVENOUS | Status: DC | PRN

## 2024-01-04 MED ORDER — SODIUM CHLORIDE 0.9 % IJ SOLN: 10.0000 mL | INTRAMUSCULAR | Status: DC | PRN

## 2024-01-04 MED ORDER — DEXTROSE 5 % IV SOLN: INTRAVENOUS | Status: DC

## 2024-01-04 MED ORDER — ALTEPLASE 2 MG IJ SOLR: 2.0000 mg | Freq: Once | INTRAMUSCULAR | Status: DC | PRN

## 2024-01-04 NOTE — Progress Notes (Signed)
 12 Hematology and Oncology Follow Up Visit  [name] [name] [id number] 06-13-1950 73 y.o. 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 is back for follow-up.  We last saw him back in August.  He and his wife will be going down to [place]  for a nephew's wedding in a couple weeks.  I know that they will have a wonderful time down there.  I told him to please watch out for COVID.  I told him to also drink a lot of water and make sure they wear sunscreen.  He is having some pain over in the left side.  This seems to occur when he bends over.  It is hard to say what this might be.  We will have to get a CT scan.  He has not had a CT scan probably for about a year.  He does feel a bit tired.  We will have to see what his iron studies show.  He has had no fever.  He has had no problems with nausea or vomiting.  He has had no problems with bowels or bladder.  Has had a little bit of leg swelling.  He has taken the diuretic every day or so.  When we last saw him, his monoclonal spike was 0.3 g/dL.  His IgM level was 300 mg/dL.  The Kappa light chain was 4 mg/dL.  Currently, I would have to say that his performance status is probably ECOG 1.    Wt Readings from Last 3 Encounters:  11/02/23 222 lb (100.7 kg)  08/31/23 218 lb 12.8 oz (99.2 kg)  07/13/23 216 lb (98 kg)     Medications:   Current Outpatient Medications:    furosemide (LASIX) 40 MG tablet, Take 40 mg by mouth daily as needed., Disp: , Rfl:    hydrALAZINE  (APRESOLINE ) 25 MG tablet, TAKE 1 TABLET BY MOUTH THREE TIMES A DAY, Disp: 270 tablet, Rfl: 1   levothyroxine  (SYNTHROID ) 137 MCG tablet, TAKE 1 TABLET BY MOUTH EVERY DAY BEFORE BREAKFAST, Disp: 90 tablet, Rfl: 1   Melatonin 5 MG CAPS, Take by mouth at bedtime as needed., Disp: , Rfl:    Multiple Vitamins-Minerals (CENTRUM SILVER 50+MEN PO), Take by mouth daily., Disp: , Rfl:    rosuvastatin  (CRESTOR ) 20 MG tablet, TAKE 1 TABLET BY MOUTH EVERY DAY, Disp: 90 tablet, Rfl: 1   venlafaxine  XR (EFFEXOR -XR) 37.5 MG 24 hr capsule, TAKE 1 CAPSULE BY MOUTH DAILY WITH BREAKFAST., Disp: 90 capsule, Rfl: 2  Allergies: No Known Allergies  Past Medical History, Surgical history, Social history, and Family History were reviewed and updated.  Review of Systems: Review of Systems  Constitutional: Negative.   HENT:  Negative.    Eyes: Negative.   Respiratory: Negative.    Cardiovascular: Negative.   Gastrointestinal:  Positive for nausea (mild occasional). Negative for abdominal pain.  Endocrine: Negative.   Genitourinary:  Negative.    Musculoskeletal: Negative.   Skin: Negative.   Neurological: Negative.   Hematological: Negative.   Psychiatric/Behavioral: Negative.      Physical Exam:  Vital signs show temperature of 99.6.  Pulse 91.  Blood pressure 134/79.  Weight is 226 pounds.    Wt Readings from Last 3 Encounters:  11/02/23 222 lb (100.7 kg)  08/31/23 218 lb 12.8 oz (99.2 kg)  07/13/23 216 lb (98 kg)    Physical Exam Vitals reviewed.  HENT:     Head: Normocephalic and atraumatic.  Eyes:     Pupils: Pupils are equal, round, and reactive to light.  Cardiovascular:     Rate and Rhythm: Normal rate and regular rhythm.     Heart sounds: Normal heart sounds.  Pulmonary:     Effort: Pulmonary effort is normal.     Breath sounds: Normal breath  sounds.  Abdominal:     General: Bowel sounds are normal.     Palpations: Abdomen is soft.  Musculoskeletal:        General: No tenderness or deformity. Normal range of motion.     Cervical back: Normal range of motion.  Lymphadenopathy:     Cervical: No cervical adenopathy.  Skin:    General: Skin is warm and dry.     Findings: No erythema or rash.  Neurological:     Mental Status: He is alert and oriented to person, place, and time.  Psychiatric:        Behavior: Behavior normal.        Thought Content: Thought content normal.        Judgment: Judgment normal.     Lab Results  Component Value Date   WBC 4.7 11/02/2023   HGB 11.6 (L) 11/02/2023   HCT 34.2 (L) 11/02/2023   MCV 98.0 11/02/2023   PLT 161 11/02/2023     Chemistry      Component Value Date/Time   NA 140 11/02/2023 0850   NA 138 12/06/2019 1501   NA 142 04/25/2016 0844   NA 140 10/19/2015 0903   K 4.0 11/02/2023 0850   K 4.1 04/25/2016 0844   K 4.1 10/19/2015 0903   CL 103 11/02/2023 0850   CL 102 04/25/2016 0844   CO2 25 11/02/2023 0850   CO2 29 04/25/2016 0844   CO2 25 10/19/2015 0903   BUN 20 11/02/2023 0850   BUN 20 12/06/2019 1501   BUN 14 04/25/2016 0844   BUN 21.6 10/19/2015 0903   CREATININE 1.55 (H) 11/02/2023 0850   CREATININE 1.1 04/25/2016 0844   CREATININE 1.3 10/19/2015 0903      Component Value Date/Time   CALCIUM  9.2 11/02/2023 0850   CALCIUM  9.6 04/25/2016 0844   CALCIUM  9.4 10/19/2015 0903   ALKPHOS 62 11/02/2023 0850   ALKPHOS 59 04/25/2016 0844   ALKPHOS 68 10/19/2015 0903   AST 23 11/02/2023 0850   AST 21 10/19/2015 0903   ALT 18 11/02/2023 0850   ALT 27 04/25/2016 0844   ALT 19 10/19/2015 0903   BILITOT 0.4 11/02/2023 0850   BILITOT 0.54 10/19/2015 0903       Impression and Plan: [name] is a 73 year old white male.  We treated him 10 years ago.  He had a marginal zone lymphoma.  He went into clinical remission.  When he initially presented, he had  splenomegaly.  When he relapsed, he again had splenomegaly.  We subsequently treated him with Imbruvica  and venetoclax .  He completed his  treatments back in April. He has since completed 6 cycles of Rituxan /Bendamustine .  He completed this back in March 2024.  I will go ahead and give him IVIG today.  I think this would be helpful.  He has little bit of a temperature.  He will be traveling.  I do think that the IVIG will help his immune system.  We will go ahead and set him up with a CT scan.  Will see back in the CT scan next week.  We will see if there is any change in his spleen.  We will see what his monoclonal studies look like.  For right now, we will go ahead and plan to get him back after the Holiday season.  I think this would be reasonable.     Maude JONELLE Crease, MD 10/16/20259:03 AM

## 2024-01-04 NOTE — Patient Instructions (Signed)

## 2024-01-04 NOTE — Patient Instructions (Signed)
 Immune Globulin  Injection What is this medication? IMMUNE GLOBULIN  (im MUNE GLOB yoo lin) treats many immune system conditions. It works by Designer, multimedia extra antibodies. Antibodies are proteins made by the immune system that help protect the body. This medicine may be used for other purposes; ask your health care provider or pharmacist if you have questions. COMMON BRAND NAME(S): ASCENIV, Baygam, BIVIGAM, Carimune, Carimune NF, cutaquig, Cuvitru, Flebogamma, Flebogamma DIF, GamaSTAN, GamaSTAN S/D, Gamimune N, Gammagard, Gammagard S/D, Gammaked, Gammaplex, Gammar-P IV, Gamunex, Gamunex-C, Hizentra, Iveegam, Iveegam EN, Octagam, Panglobulin, Panglobulin NF, panzyga, Polygam S/D, Privigen , Sandoglobulin, Venoglobulin-S, Vigam, Vivaglobulin, Xembify What should I tell my care team before I take this medication? They need to know if you have any of these conditions: Blood clotting disorder Condition where you have excess fluid in your body, such as heart failure or edema Dehydration Diabetes Have had blood clots Heart disease Immune system conditions Kidney disease Low levels of IgA Recent or upcoming vaccine An unusual or allergic reaction to immune globulin , other medications, foods, dyes, or preservatives Pregnant or trying to get pregnant Breastfeeding How should I use this medication? This medication is infused into a vein or under the skin. It may also be injected into a muscle. It is usually given by your care team in a hospital or clinic setting. It may also be given at home. If you get this medication at home, you will be taught how to prepare and give it. Take it as directed on the prescription label. Keep taking it unless your care team tells you to stop. It is important that you put your used needles and syringes in a special sharps container. Do not put them in a trash can. If you do not have a sharps container, call your pharmacist or care team to get one. Talk to your care team  about the use of this medication in children. While it may be given to children for selected conditions, precautions do apply. Overdosage: If you think you have taken too much of this medicine contact a poison control center or emergency room at once. NOTE: This medicine is only for you. Do not share this medicine with others. What if I miss a dose? If you get this medication at the hospital or clinic: It is important not to miss your dose. Call your care team if you are unable to keep an appointment. If you give yourself this medication at home: If you miss a dose, take it as soon as you can. Then continue your normal schedule. If it is almost time for your next dose, take only that dose. Do not take double or extra doses. Call your care team with questions. What may interact with this medication? Live virus vaccines This list may not describe all possible interactions. Give your health care provider a list of all the medicines, herbs, non-prescription drugs, or dietary supplements you use. Also tell them if you smoke, drink alcohol , or use illegal drugs. Some items may interact with your medicine. What should I watch for while using this medication? Your condition will be monitored carefully while you are receiving this medication. Tell your care team if your symptoms do not start to get better or if they get worse. You may need blood work done while you are taking this medication. This medication increases the risk of blood clots. People with heart, blood vessel, or blood clotting conditions are more likely to develop a blood clot. Other risk factors include advanced age, estrogen use, tobacco  use, lack of movement, and being overweight. This medication can decrease the response to a vaccine. If you need to get vaccinated, tell your care team if you have received this medication within the last year. Extra booster doses may be needed. Talk to your care team to see if a different vaccination schedule  is needed. This medication is made from donated human blood. There is a small risk it may contain bacteria or viruses, such as hepatitis or HIV. All products are processed to kill most bacteria and viruses. Talk to your care team if you have questions about the risk of infection. If you have diabetes, talk to your care team about which device you should use to check your blood sugar. This medication may cause some devices to report falsely high blood sugar levels. This may cause you to react by not treating a low blood sugar level or by giving an insulin  dose that was not needed. This can cause severe low blood sugar levels. What side effects may I notice from receiving this medication? Side effects that you should report to your care team as soon as possible: Allergic reactions--skin rash, itching, hives, swelling of the face, lips, tongue, or throat Blood clot--pain, swelling, or warmth in the leg, shortness of breath, chest pain Fever, neck pain or stiffness, sensitivity to light, headache, nausea, vomiting, confusion, which may be signs of meningitis Hemolytic anemia--unusual weakness or fatigue, dizziness, headache, trouble breathing, dark urine, yellowing skin or eyes Kidney injury--decrease in the amount of urine, swelling of the ankles, hands, or feet Low sodium level--muscle weakness, fatigue, dizziness, headache, confusion Shortness of breath or trouble breathing, cough, unusual weakness or fatigue, blue skin or lips Side effects that usually do not require medical attention (report these to your care team if they continue or are bothersome): Chills Diarrhea Fever Headache Nausea This list may not describe all possible side effects. Call your doctor for medical advice about side effects. You may report side effects to FDA at 1-800-FDA-1088. Where should I keep my medication? Keep out of the reach of children and pets. You will be instructed on how to store this medication. Get rid of  any unused medication after the expiration date. To get rid of medications that are no longer needed or have expired: Take the medication to a medication take-back program. Check with your pharmacy or law enforcement to find a location. If you cannot return the medication, ask your pharmacist or care team how to get rid of this medication safely. NOTE: This sheet is a summary. It may not cover all possible information. If you have questions about this medicine, talk to your doctor, pharmacist, or health care provider.  2025 Elsevier/Gold Standard (2023-05-22 00:00:00)

## 2024-01-05 ENCOUNTER — Ambulatory Visit: Payer: Self-pay | Admitting: Hematology & Oncology

## 2024-01-05 LAB — KAPPA/LAMBDA LIGHT CHAINS
Kappa free light chain: 45 mg/L — ABNORMAL HIGH (ref 3.3–19.4)
Kappa, lambda light chain ratio: 4.25 — ABNORMAL HIGH (ref 0.26–1.65)
Lambda free light chains: 10.6 mg/L (ref 5.7–26.3)

## 2024-01-05 LAB — IGG, IGA, IGM
IgA: 73 mg/dL (ref 61–437)
IgG (Immunoglobin G), Serum: 485 mg/dL — ABNORMAL LOW (ref 603–1613)
IgM (Immunoglobulin M), Srm: 311 mg/dL — ABNORMAL HIGH (ref 15–143)

## 2024-01-05 NOTE — Telephone Encounter (Signed)
 Advised via MyChart.

## 2024-01-05 NOTE — Telephone Encounter (Signed)
-----   Message from [name] [name] sent at 01/05/2024  6:52 AM EDT ----- Please call and let him know that the iron level is okay.  Jeralyn ----- Message ----- From: Rebecka, [facility] [place] Sent: 01/04/2024   9:11 AM EDT To: [name] JONELLE Crease, MD

## 2024-01-08 ENCOUNTER — Encounter (HOSPITAL_BASED_OUTPATIENT_CLINIC_OR_DEPARTMENT_OTHER): Payer: Self-pay

## 2024-01-08 ENCOUNTER — Ambulatory Visit (HOSPITAL_BASED_OUTPATIENT_CLINIC_OR_DEPARTMENT_OTHER)
Admission: RE | Admit: 2024-01-08 | Discharge: 2024-01-08 | Disposition: A | Discharge status: Home / Self Care | Source: Ambulatory Visit | Attending: Hematology & Oncology | Admitting: Hematology & Oncology

## 2024-01-08 DIAGNOSIS — I700 Atherosclerosis of aorta: Secondary | ICD-10-CM | POA: Diagnosis not present

## 2024-01-08 DIAGNOSIS — R918 Other nonspecific abnormal finding of lung field: Secondary | ICD-10-CM | POA: Diagnosis not present

## 2024-01-08 DIAGNOSIS — C8583 Other specified types of non-Hodgkin lymphoma, intra-abdominal lymph nodes: Secondary | ICD-10-CM | POA: Diagnosis not present

## 2024-01-08 DIAGNOSIS — C969 Malignant neoplasm of lymphoid, hematopoietic and related tissue, unspecified: Secondary | ICD-10-CM | POA: Diagnosis not present

## 2024-01-08 MED ORDER — HEPARIN SOD (PORK) LOCK FLUSH 100 UNIT/ML IV SOLN
500.0000 [IU] | Freq: Once | INTRAVENOUS | Status: AC
  Administered 2024-01-08: 500 [IU] via INTRAVENOUS

## 2024-01-08 MED ORDER — IOHEXOL 300 MG/ML  SOLN
100.0000 mL | Freq: Once | INTRAMUSCULAR | Status: AC | PRN
  Administered 2024-01-08: 80 mL via INTRAVENOUS

## 2024-01-10 LAB — PROTEIN ELECTROPHORESIS, SERUM, WITH REFLEX
A/G Ratio: 1.4 (ref 0.7–1.7)
Albumin ELP: 3.8 g/dL (ref 2.9–4.4)
Alpha-1-Globulin: 0.2 g/dL (ref 0.0–0.4)
Alpha-2-Globulin: 1 g/dL (ref 0.4–1.0)
Beta Globulin: 1 g/dL (ref 0.7–1.3)
Gamma Globulin: 0.6 g/dL (ref 0.4–1.8)
Globulin, Total: 2.8 g/dL (ref 2.2–3.9)
M-Spike, %: 0.3 g/dL — ABNORMAL HIGH
SPEP Interpretation: 0
Total Protein ELP: 6.6 g/dL (ref 6.0–8.5)

## 2024-01-10 LAB — IMMUNOFIXATION REFLEX, SERUM
IgA: 80 mg/dL (ref 61–437)
IgG (Immunoglobin G), Serum: 533 mg/dL — ABNORMAL LOW (ref 603–1613)
IgM (Immunoglobulin M), Srm: 316 mg/dL — ABNORMAL HIGH (ref 15–143)

## 2024-01-11 ENCOUNTER — Ambulatory Visit: Payer: Self-pay | Admitting: Hematology & Oncology

## 2024-02-14 ENCOUNTER — Inpatient Hospital Stay: Attending: Hematology & Oncology

## 2024-02-14 DIAGNOSIS — C8583 Other specified types of non-Hodgkin lymphoma, intra-abdominal lymph nodes: Secondary | ICD-10-CM | POA: Insufficient documentation

## 2024-02-14 DIAGNOSIS — Z452 Encounter for adjustment and management of vascular access device: Secondary | ICD-10-CM | POA: Diagnosis not present

## 2024-02-14 NOTE — Patient Instructions (Signed)

## 2024-03-15 ENCOUNTER — Encounter: Payer: Self-pay | Admitting: Pharmacist

## 2024-03-15 NOTE — Progress Notes (Signed)
 Pharmacy Quality Measure Review  This patient is appearing on a report for being at risk of failing the adherence measure for cholesterol (statin) medications this calendar year.   Medication: Rosuvastatin  Last fill date: 01/21/24 for 90 day supply  Insurance report was not up to date. No action needed at this time.   Darrelyn Drum, PharmD, BCPS, CPP Clinical Pharmacist Practitioner [facility] [phone]

## 2024-03-29 ENCOUNTER — Inpatient Hospital Stay: Admitting: Hematology & Oncology

## 2024-03-29 ENCOUNTER — Inpatient Hospital Stay

## 2024-03-29 ENCOUNTER — Inpatient Hospital Stay: Attending: Hematology & Oncology

## 2024-03-29 ENCOUNTER — Encounter: Payer: Self-pay | Admitting: Hematology & Oncology

## 2024-03-29 ENCOUNTER — Other Ambulatory Visit: Payer: Self-pay

## 2024-03-29 VITALS — BP 154/73 | HR 64

## 2024-03-29 VITALS — BP 134/75 | HR 76 | Temp 98.3°F | Resp 18 | Ht 71.0 in | Wt 232.0 lb

## 2024-03-29 DIAGNOSIS — D801 Nonfamilial hypogammaglobulinemia: Secondary | ICD-10-CM | POA: Diagnosis present

## 2024-03-29 DIAGNOSIS — C8583 Other specified types of non-Hodgkin lymphoma, intra-abdominal lymph nodes: Secondary | ICD-10-CM

## 2024-03-29 DIAGNOSIS — E032 Hypothyroidism due to medicaments and other exogenous substances: Secondary | ICD-10-CM | POA: Diagnosis not present

## 2024-03-29 DIAGNOSIS — Z79620 Long term (current) use of immunosuppressive biologic: Secondary | ICD-10-CM | POA: Diagnosis not present

## 2024-03-29 DIAGNOSIS — Z79899 Other long term (current) drug therapy: Secondary | ICD-10-CM | POA: Diagnosis not present

## 2024-03-29 DIAGNOSIS — D509 Iron deficiency anemia, unspecified: Secondary | ICD-10-CM | POA: Insufficient documentation

## 2024-03-29 LAB — CMP (CANCER CENTER ONLY)
ALT: 21 U/L (ref 0–44)
AST: 24 U/L (ref 15–41)
Albumin: 4.4 g/dL (ref 3.5–5.0)
Alkaline Phosphatase: 61 U/L (ref 38–126)
Anion gap: 9 (ref 5–15)
BUN: 27 mg/dL — ABNORMAL HIGH (ref 8–23)
CO2: 28 mmol/L (ref 22–32)
Calcium: 9.3 mg/dL (ref 8.9–10.3)
Chloride: 102 mmol/L (ref 98–111)
Creatinine: 1.56 mg/dL — ABNORMAL HIGH (ref 0.61–1.24)
GFR, Estimated: 47 mL/min — ABNORMAL LOW
Glucose, Bld: 96 mg/dL (ref 70–99)
Potassium: 4.2 mmol/L (ref 3.5–5.1)
Sodium: 139 mmol/L (ref 135–145)
Total Bilirubin: 0.3 mg/dL (ref 0.0–1.2)
Total Protein: 6.8 g/dL (ref 6.5–8.1)

## 2024-03-29 LAB — CBC WITH DIFFERENTIAL (CANCER CENTER ONLY)
Abs Immature Granulocytes: 0.03 K/uL (ref 0.00–0.07)
Basophils Absolute: 0.1 K/uL (ref 0.0–0.1)
Basophils Relative: 1 %
Eosinophils Absolute: 0.1 K/uL (ref 0.0–0.5)
Eosinophils Relative: 2 %
HCT: 33.5 % — ABNORMAL LOW (ref 39.0–52.0)
Hemoglobin: 11.2 g/dL — ABNORMAL LOW (ref 13.0–17.0)
Immature Granulocytes: 1 %
Lymphocytes Relative: 18 %
Lymphs Abs: 1 K/uL (ref 0.7–4.0)
MCH: 32.4 pg (ref 26.0–34.0)
MCHC: 33.4 g/dL (ref 30.0–36.0)
MCV: 96.8 fL (ref 80.0–100.0)
Monocytes Absolute: 0.7 K/uL (ref 0.1–1.0)
Monocytes Relative: 14 %
Neutro Abs: 3.3 K/uL (ref 1.7–7.7)
Neutrophils Relative %: 64 %
Platelet Count: 162 K/uL (ref 150–400)
RBC: 3.46 MIL/uL — ABNORMAL LOW (ref 4.22–5.81)
RDW: 13.2 % (ref 11.5–15.5)
WBC Count: 5.2 K/uL (ref 4.0–10.5)
nRBC: 0 % (ref 0.0–0.2)

## 2024-03-29 LAB — FERRITIN: Ferritin: 90 ng/mL (ref 24–336)

## 2024-03-29 LAB — TSH: TSH: 2.81 u[IU]/mL (ref 0.350–4.500)

## 2024-03-29 LAB — IRON AND IRON BINDING CAPACITY (CC-WL,HP ONLY)
Iron: 63 ug/dL (ref 45–182)
Saturation Ratios: 18 % (ref 17.9–39.5)
TIBC: 343 ug/dL (ref 250–450)
UIBC: 280 ug/dL

## 2024-03-29 LAB — LACTATE DEHYDROGENASE: LDH: 183 U/L (ref 105–235)

## 2024-03-29 MED ORDER — IMMUNE GLOBULIN (HUMAN) 40 GM/400ML IV SOLN
40.0000 g | Freq: Once | INTRAVENOUS | Status: AC
  Administered 2024-03-29: 40 g via INTRAVENOUS
  Filled 2024-03-29: qty 400

## 2024-03-29 MED ORDER — PROCHLORPERAZINE MALEATE 10 MG PO TABS: 10.0000 mg | ORAL_TABLET | Freq: Four times a day (QID) | ORAL | 1 refills | Status: AC | PRN

## 2024-03-29 MED ORDER — LIDOCAINE-PRILOCAINE 2.5-2.5 % EX CREA: TOPICAL_CREAM | CUTANEOUS | 3 refills | Status: AC

## 2024-03-29 MED ORDER — DEXTROSE 5 % IV SOLN: INTRAVENOUS | Status: DC

## 2024-03-29 MED ORDER — FAMOTIDINE IN NACL 20-0.9 MG/50ML-% IV SOLN
20.0000 mg | Freq: Once | INTRAVENOUS | Status: AC
  Administered 2024-03-29: 20 mg via INTRAVENOUS
  Filled 2024-03-29: qty 50

## 2024-03-29 MED ORDER — ACETAMINOPHEN 325 MG PO TABS
650.0000 mg | ORAL_TABLET | Freq: Once | ORAL | Status: AC
  Administered 2024-03-29: 650 mg via ORAL
  Filled 2024-03-29: qty 2

## 2024-03-29 NOTE — Patient Instructions (Signed)

## 2024-03-29 NOTE — Progress Notes (Signed)
 12 Hematology and Oncology Follow Up Visit  [name] [name] [id number] December 20, 1950 74 y.o. 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  We last saw him back in October.  Since then, he has been doing okay.  He has had no problems over the holiday season.  He still gets a little bit tired.  He does not have a lot of stamina.  His weight is going up.  This might be part of their issue.  He is on Synthroid .  His TSH  needs to be checked.  We will see what it is.  He and his wife had a good time down in [place] .  They are down there for a nephew's wedding.  They enjoyed themselves.  He has had no problems with fever.  He has had no bleeding.  He does have some small ecchymoses on his arms..  He has had no change in bowel or bladder habits.  He has a little bit of swelling in the legs.  This is chronic.  He had a CT scan done 01/08/2024.  The CT scan did not show any evidence of adenopathy or splenomegaly.  His last monoclonal studies showed an M spike of 0.3 g/dL.  His IgM level is 316 mg/dL.  The Kappa light chain is 4.5 mg/dL.  His last iron studies that we did back in October showed a ferritin of 145 with an iron saturation of 23%.  He has had no bleeding.  He has had no headache.  Overall, I will say that his performance status is probably ECOG 1.       Wt Readings from Last 3 Encounters:  03/29/24 232 lb (105.2 kg)  01/04/24 226 lb (102.5 kg)  11/02/23 222 lb (100.7 kg)     Medications:  Current Outpatient Medications:    furosemide (LASIX) 40 MG tablet, Take 40 mg by mouth daily as needed., Disp: , Rfl:    hydrALAZINE  (APRESOLINE ) 25 MG tablet, TAKE 1 TABLET BY MOUTH THREE TIMES A DAY (Patient taking differently: Take 25 mg by mouth 2 times daily at 12 noon and 4 pm.), Disp: 270 tablet, Rfl: 1   levothyroxine  (SYNTHROID ) 137 MCG tablet, TAKE 1 TABLET BY MOUTH EVERY DAY BEFORE BREAKFAST (Patient taking differently: Take 137 mcg by mouth. Six days per week.), Disp: 90 tablet, Rfl: 1   lidocaine -prilocaine  (EMLA ) cream, Apply to affected area once, Disp: 30 g, Rfl: 3   Melatonin 5 MG CAPS, Take by mouth at bedtime as needed., Disp: , Rfl:    Multiple Vitamins-Minerals (CENTRUM SILVER 50+MEN PO), Take by mouth daily., Disp: , Rfl:    prochlorperazine  (COMPAZINE ) 10 MG tablet, Take 1 tablet (10 mg total) by mouth every 6 (six) hours as needed for nausea or vomiting., Disp: 30 tablet, Rfl: 1   rosuvastatin  (CRESTOR ) 20 MG tablet, TAKE 1 TABLET BY MOUTH  EVERY DAY, Disp: 90 tablet, Rfl: 1   venlafaxine  XR (EFFEXOR -XR) 37.5 MG 24 hr capsule, TAKE 1 CAPSULE BY MOUTH DAILY WITH BREAKFAST., Disp: 90 capsule, Rfl: 2  Allergies: No Known Allergies  Past Medical History, Surgical history, Social history, and Family History were reviewed and updated.  Review of Systems: Review of Systems  Constitutional: Negative.   HENT:  Negative.    Eyes: Negative.   Respiratory: Negative.    Cardiovascular: Negative.   Gastrointestinal:  Positive for nausea (mild occasional). Negative for abdominal pain.  Endocrine: Negative.   Genitourinary: Negative.    Musculoskeletal: Negative.   Skin: Negative.   Neurological: Negative.   Hematological: Negative.   Psychiatric/Behavioral: Negative.      Physical Exam:  Vital signs show temperature of 99.6.   Pulse 91.  Blood pressure 134/75.  Weight is 232 pounds.    Wt Readings from Last 3 Encounters:  03/29/24 232 lb (105.2 kg)  01/04/24 226 lb (102.5 kg)  11/02/23 222 lb (100.7 kg)    Physical Exam Vitals reviewed.  HENT:     Head: Normocephalic and atraumatic.  Eyes:     Pupils: Pupils are equal, round, and reactive to light.  Cardiovascular:     Rate and Rhythm: Normal rate and regular rhythm.     Heart sounds: Normal heart sounds.  Pulmonary:     Effort: Pulmonary effort is normal.     Breath sounds: Normal breath sounds.  Abdominal:     General: Bowel sounds are normal.     Palpations: Abdomen is soft.  Musculoskeletal:        General: No tenderness or deformity. Normal range of motion.     Cervical back: Normal range of motion.  Lymphadenopathy:     Cervical: No cervical adenopathy.  Skin:    General: Skin is warm and dry.     Findings: No erythema or rash.  Neurological:     Mental Status: He is alert and oriented to person, place, and time.  Psychiatric:        Behavior: Behavior normal.        Thought Content: Thought content normal.        Judgment: Judgment normal.     Lab Results  Component Value Date   WBC 5.2 03/29/2024   HGB 11.2 (L) 03/29/2024   HCT 33.5 (L) 03/29/2024   MCV 96.8 03/29/2024   PLT 162 03/29/2024     Chemistry      Component Value Date/Time   NA 139 03/29/2024 0830   NA 138 12/06/2019 1501   NA 142 04/25/2016 0844   NA 140 10/19/2015 0903   K 4.2 03/29/2024 0830   K 4.1 04/25/2016 0844   K 4.1 10/19/2015 0903   CL 102 03/29/2024 0830   CL 102 04/25/2016 0844   CO2 28 03/29/2024 0830   CO2 29 04/25/2016 0844   CO2 25 10/19/2015 0903   BUN 27 (H) 03/29/2024 0830   BUN 20 12/06/2019 1501   BUN 14 04/25/2016 0844   BUN 21.6 10/19/2015 0903   CREATININE 1.56 (H) 03/29/2024 0830   CREATININE 1.1 04/25/2016 0844   CREATININE 1.3 10/19/2015 0903      Component Value Date/Time   CALCIUM  9.3 03/29/2024 0830   CALCIUM  9.6  04/25/2016 0844   CALCIUM  9.4 10/19/2015 0903   ALKPHOS 61 03/29/2024 0830   ALKPHOS 59 04/25/2016 0844   ALKPHOS 68 10/19/2015 0903   AST 24 03/29/2024 0830   AST 21 10/19/2015  0903   ALT 21 03/29/2024 0830   ALT 27 04/25/2016 0844   ALT 19 10/19/2015 0903   BILITOT 0.3 03/29/2024 0830   BILITOT 0.54 10/19/2015 9096       Impression and Plan: [name] [name] is a 74 year old white male.  We treated him 10 years ago.  He had a marginal zone lymphoma.  He went into clinical remission.  When he initially presented, he had splenomegaly.  When he relapsed, he again had splenomegaly.  We subsequently treated him with Imbruvica  and venetoclax .  He completed his treatments back in April. He has since completed 6 cycles of Rituxan /Bendamustine .  He completed this back in March 2024.  I will go ahead and give him IVIG today.  I think this would be helpful.   We will see what his TSH is.  I am glad the CT scan looked fantastic.  Again, I do not think his issues are anything related to him having treatment in the past.  Again, he does have hypothyroidism.  He does take hydralazine  which recently caused some symptoms.  We will see what his monoclonal studies look like.  We will see what his iron studies look like.  We will plan to get him back hopefully in about 3 months.   Maude JONELLE Crease, MD 1/9/20269:21 AM

## 2024-03-29 NOTE — Patient Instructions (Signed)
 Immune Globulin  Injection What is this medication? IMMUNE GLOBULIN  (im MUNE GLOB yoo lin) treats many immune system conditions. It works by Designer, multimedia extra antibodies. Antibodies are proteins made by the immune system that help protect the body. This medicine may be used for other purposes; ask your health care provider or pharmacist if you have questions. COMMON BRAND NAME(S): ASCENIV, Baygam, BIVIGAM, Carimune, Carimune NF, cutaquig, Cuvitru, Flebogamma, Flebogamma DIF, GamaSTAN, GamaSTAN S/D, Gamimune N, Gammagard, Gammagard S/D, Gammaked, Gammaplex, Gammar-P IV, Gamunex, Gamunex-C, Hizentra, Iveegam, Iveegam EN, Octagam, Panglobulin, Panglobulin NF, panzyga, Polygam S/D, Privigen , Sandoglobulin, Venoglobulin-S, Vigam, Vivaglobulin, Xembify What should I tell my care team before I take this medication? They need to know if you have any of these conditions: Blood clotting disorder Condition where you have excess fluid in your body, such as heart failure or edema Dehydration Diabetes Have had blood clots Heart disease Immune system conditions Kidney disease Low levels of IgA Recent or upcoming vaccine An unusual or allergic reaction to immune globulin , other medications, foods, dyes, or preservatives Pregnant or trying to get pregnant Breastfeeding How should I use this medication? This medication is infused into a vein or under the skin. It may also be injected into a muscle. It is usually given by your care team in a hospital or clinic setting. It may also be given at home. If you get this medication at home, you will be taught how to prepare and give it. Take it as directed on the prescription label. Keep taking it unless your care team tells you to stop. It is important that you put your used needles and syringes in a special sharps container. Do not put them in a trash can. If you do not have a sharps container, call your pharmacist or care team to get one. Talk to your care team  about the use of this medication in children. While it may be given to children for selected conditions, precautions do apply. Overdosage: If you think you have taken too much of this medicine contact a poison control center or emergency room at once. NOTE: This medicine is only for you. Do not share this medicine with others. What if I miss a dose? If you get this medication at the hospital or clinic: It is important not to miss your dose. Call your care team if you are unable to keep an appointment. If you give yourself this medication at home: If you miss a dose, take it as soon as you can. Then continue your normal schedule. If it is almost time for your next dose, take only that dose. Do not take double or extra doses. Call your care team with questions. What may interact with this medication? Live virus vaccines This list may not describe all possible interactions. Give your health care provider a list of all the medicines, herbs, non-prescription drugs, or dietary supplements you use. Also tell them if you smoke, drink alcohol , or use illegal drugs. Some items may interact with your medicine. What should I watch for while using this medication? Your condition will be monitored carefully while you are receiving this medication. Tell your care team if your symptoms do not start to get better or if they get worse. You may need blood work done while you are taking this medication. This medication increases the risk of blood clots. People with heart, blood vessel, or blood clotting conditions are more likely to develop a blood clot. Other risk factors include advanced age, estrogen use, tobacco  use, lack of movement, and being overweight. This medication can decrease the response to a vaccine. If you need to get vaccinated, tell your care team if you have received this medication within the last year. Extra booster doses may be needed. Talk to your care team to see if a different vaccination schedule  is needed. This medication is made from donated human blood. There is a small risk it may contain bacteria or viruses, such as hepatitis or HIV. All products are processed to kill most bacteria and viruses. Talk to your care team if you have questions about the risk of infection. If you have diabetes, talk to your care team about which device you should use to check your blood sugar. This medication may cause some devices to report falsely high blood sugar levels. This may cause you to react by not treating a low blood sugar level or by giving an insulin  dose that was not needed. This can cause severe low blood sugar levels. What side effects may I notice from receiving this medication? Side effects that you should report to your care team as soon as possible: Allergic reactions--skin rash, itching, hives, swelling of the face, lips, tongue, or throat Blood clot--pain, swelling, or warmth in the leg, shortness of breath, chest pain Fever, neck pain or stiffness, sensitivity to light, headache, nausea, vomiting, confusion, which may be signs of meningitis Hemolytic anemia--unusual weakness or fatigue, dizziness, headache, trouble breathing, dark urine, yellowing skin or eyes Kidney injury--decrease in the amount of urine, swelling of the ankles, hands, or feet Low sodium level--muscle weakness, fatigue, dizziness, headache, confusion Shortness of breath or trouble breathing, cough, unusual weakness or fatigue, blue skin or lips Side effects that usually do not require medical attention (report these to your care team if they continue or are bothersome): Chills Diarrhea Fever Headache Nausea This list may not describe all possible side effects. Call your doctor for medical advice about side effects. You may report side effects to FDA at 1-800-FDA-1088. Where should I keep my medication? Keep out of the reach of children and pets. You will be instructed on how to store this medication. Get rid of  any unused medication after the expiration date. To get rid of medications that are no longer needed or have expired: Take the medication to a medication take-back program. Check with your pharmacy or law enforcement to find a location. If you cannot return the medication, ask your pharmacist or care team how to get rid of this medication safely. NOTE: This sheet is a summary. It may not cover all possible information. If you have questions about this medicine, talk to your doctor, pharmacist, or health care provider.  2025 Elsevier/Gold Standard (2023-05-22 00:00:00)

## 2024-03-30 LAB — IGG, IGA, IGM
IgA: 75 mg/dL (ref 61–437)
IgG (Immunoglobin G), Serum: 572 mg/dL — ABNORMAL LOW (ref 603–1613)
IgM (Immunoglobulin M), Srm: 309 mg/dL — ABNORMAL HIGH (ref 15–143)

## 2024-04-01 ENCOUNTER — Telehealth: Payer: Self-pay | Admitting: Hematology & Oncology

## 2024-04-01 LAB — KAPPA/LAMBDA LIGHT CHAINS
Kappa free light chain: 39.2 mg/L — ABNORMAL HIGH (ref 3.3–19.4)
Kappa, lambda light chain ratio: 4.22 — ABNORMAL HIGH (ref 0.26–1.65)
Lambda free light chains: 9.3 mg/L (ref 5.7–26.3)

## 2024-04-01 NOTE — Telephone Encounter (Signed)
 Called to schedule infusion per inbasket. LVM to return call for scheduling.

## 2024-04-02 ENCOUNTER — Telehealth: Payer: Self-pay

## 2024-04-02 NOTE — Telephone Encounter (Signed)
 Oral Oncology Patient Advocate Encounter   Received notification that prior authorization for Lido/Prilo Cream is required.   PA submitted on 04/02/24 Key [id number] Status is pending      Charlott Hamilton,  CPhT-Adv  she/her/hers [facility]  [facility] Pharmacy Technician Patient Advocate Specialist III [facility] Phone: [phone]  Fax: [phone] [email or web address]

## 2024-04-04 ENCOUNTER — Encounter: Payer: Self-pay | Admitting: Internal Medicine

## 2024-04-04 ENCOUNTER — Inpatient Hospital Stay

## 2024-04-04 VITALS — BP 157/78 | HR 81 | Temp 98.9°F | Resp 18

## 2024-04-04 DIAGNOSIS — C8583 Other specified types of non-Hodgkin lymphoma, intra-abdominal lymph nodes: Secondary | ICD-10-CM

## 2024-04-04 DIAGNOSIS — D801 Nonfamilial hypogammaglobulinemia: Secondary | ICD-10-CM | POA: Diagnosis not present

## 2024-04-04 LAB — PROTEIN ELECTROPHORESIS, SERUM, WITH REFLEX
A/G Ratio: 1.2 (ref 0.7–1.7)
Albumin ELP: 3.6 g/dL (ref 2.9–4.4)
Alpha-1-Globulin: 0.2 g/dL (ref 0.0–0.4)
Alpha-2-Globulin: 1 g/dL (ref 0.4–1.0)
Beta Globulin: 0.9 g/dL (ref 0.7–1.3)
Gamma Globulin: 0.7 g/dL (ref 0.4–1.8)
Globulin, Total: 2.9 g/dL (ref 2.2–3.9)
M-Spike, %: 0.3 g/dL — ABNORMAL HIGH
SPEP Interpretation: 0
Total Protein ELP: 6.5 g/dL (ref 6.0–8.5)

## 2024-04-04 LAB — IMMUNOFIXATION REFLEX, SERUM
IgA: 74 mg/dL (ref 61–437)
IgG (Immunoglobin G), Serum: 571 mg/dL — ABNORMAL LOW (ref 603–1613)
IgM (Immunoglobulin M), Srm: 292 mg/dL — ABNORMAL HIGH (ref 15–143)

## 2024-04-04 MED ORDER — SODIUM CHLORIDE 0.9 % IV SOLN: INTRAVENOUS | Status: DC

## 2024-04-04 MED ORDER — SODIUM CHLORIDE 0.9 % IV SOLN
1000.0000 mg | Freq: Once | INTRAVENOUS | Status: AC
  Administered 2024-04-04: 1000 mg via INTRAVENOUS
  Filled 2024-04-04: qty 10

## 2024-04-04 NOTE — Patient Instructions (Signed)

## 2024-04-05 NOTE — Telephone Encounter (Signed)
 Oral Oncology Patient Advocate Encounter  Prior Authorization for Lido/Prilo Cream has been approved.    PA# [id number] Effective dates: 04/05/24 through 07/04/24   Patient has been notified via MyChart   Charlott Hamilton,  CPhT-Adv  she/her/hers [facility]  [facility] Pharmacy Technician Patient Advocate Specialist III [facility] and [place] Phone: [phone]  Fax: [phone] [email or web address]

## 2024-04-18 ENCOUNTER — Other Ambulatory Visit: Payer: Self-pay | Admitting: Internal Medicine

## 2024-04-18 ENCOUNTER — Other Ambulatory Visit: Payer: Self-pay | Admitting: Hematology & Oncology

## 2024-04-24 ENCOUNTER — Ambulatory Visit

## 2024-04-24 VITALS — BP 138/78 | HR 94 | Ht 70.5 in | Wt 231.8 lb

## 2024-04-24 DIAGNOSIS — Z0000 Encounter for general adult medical examination without abnormal findings: Secondary | ICD-10-CM

## 2024-04-24 DIAGNOSIS — Z23 Encounter for immunization: Secondary | ICD-10-CM

## 2024-04-24 NOTE — Progress Notes (Signed)
 "  Chief Complaint  Patient presents with   Medicare Wellness     Subjective:   [name] [name] is a 74 y.o. male who presents for a Medicare Annual Wellness Visit.  Visit info / Clinical Intake: Medicare Wellness Visit Type:: Subsequent Annual Wellness Visit Persons participating in visit and providing information:: patient Medicare Wellness Visit Mode:: In-person (required for WTM) Interpreter Needed?: No Pre-visit prep was completed: yes AWV questionnaire completed by patient prior to visit?: yes Date:: 04/23/24 Living arrangements:: lives with spouse/significant other Patient's Overall Health Status Rating: good Typical amount of pain: none Does pain affect daily life?: no Are you currently prescribed opioids?: no  Dietary Habits and Nutritional Risks How many meals a day?: 2 Eats fruit and vegetables daily?: yes Most meals are obtained by: having others provide food In the last 2 weeks, have you had any of the following?: none Diabetic:: no  Functional Status Activities of Daily Living (to include ambulation/medication): Independent Ambulation: Independent with device- listed below Home Assistive Devices/Equipment: Eyeglasses Medication Administration: Independent Home Management (perform basic housework or laundry): Independent Manage your own finances?: yes Primary transportation is: driving Concerns about vision?: no *vision screening is required for WTM* Concerns about hearing?: no  Fall Screening Falls in the past year?: 0 Number of falls in past year: 0 Was there an injury with Fall?: 0 Fall Risk Category Calculator: 0 Patient Fall Risk Level: Low Fall Risk  Fall Risk Patient at Risk for Falls Due to: No Fall Risks Fall risk Follow up: Falls evaluation completed; Falls prevention discussed  Home and Transportation Safety: All rugs have non-skid backing?: yes All stairs or steps have railings?: yes Grab bars in the bathtub or shower?: (!) no Have  non-skid surface in bathtub or shower?: yes Good home lighting?: yes Regular seat belt use?: yes Hospital stays in the last year:: no  Cognitive Assessment Difficulty concentrating, remembering, or making decisions? : no Will 6CIT or Mini Cog be Completed: yes What year is it?: 0 points What month is it?: 0 points Give patient an address phrase to remember (5 components): [address] [place], [place] About what time is it?: 0 points Count backwards from 20 to 1: 0 points Say the months of the year in reverse: 0 points Repeat the address phrase from earlier: 0 points 6 CIT Score: 0 points  Advance Directives (For Healthcare) Does Patient Have a Medical Advance Directive?: No Does patient want to make changes to medical advance directive?: No - Patient declined Type of Advance Directive: Living will; Healthcare Power of Attorney Copy of Healthcare Power of Attorney in Chart?: No - copy requested Copy of Living Will in Chart?: No - copy requested Would patient like information on creating a medical advance directive?: No - Patient declined  Reviewed/Updated  Reviewed/Updated: Reviewed All (Medical, Surgical, Family, Medications, Allergies, Care Teams, Patient Goals)    Allergies (verified) Patient has no known allergies.   Current Medications (verified) Outpatient Encounter Medications as of 04/24/2024  Medication Sig   furosemide (LASIX) 40 MG tablet Take 40 mg by mouth daily as needed.   hydrALAZINE  (APRESOLINE ) 25 MG tablet TAKE 1 TABLET BY MOUTH THREE TIMES A DAY (Patient taking differently: Take 25 mg by mouth 2 times daily at 12 noon and 4 pm.)   levothyroxine  (SYNTHROID ) 137 MCG tablet TAKE 1 TABLET BY MOUTH EVERY DAY BEFORE BREAKFAST (Patient taking differently: Take 137 mcg by mouth. Six days per week.)   lidocaine -prilocaine  (EMLA ) cream Apply to affected  area once   Melatonin 5 MG CAPS Take by mouth at bedtime as needed.   Multiple Vitamins-Minerals (CENTRUM SILVER  50+MEN PO) Take by mouth daily.   prochlorperazine  (COMPAZINE ) 10 MG tablet Take 1 tablet (10 mg total) by mouth every 6 (six) hours as needed for nausea or vomiting.   rosuvastatin  (CRESTOR ) 20 MG tablet TAKE 1 TABLET BY MOUTH EVERY DAY   venlafaxine  XR (EFFEXOR -XR) 37.5 MG 24 hr capsule TAKE 1 CAPSULE BY MOUTH DAILY WITH BREAKFAST.   No facility-administered encounter medications on file as of 04/24/2024.    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   Thyroid  disease 2007   hypothyroidism   Past Surgical History:  Procedure Laterality Date   COLONOSCOPY  2008   Dr Aneita   COLONOSCOPY W/ POLYPECTOMY  2014   Dr Aneita   IR BONE MARROW BIOPSY & ASPIRATION  06/27/2022   IR IMAGING GUIDED PORT INSERTION  01/26/2022   POLYPECTOMY     PORTACATH PLACEMENT  Dec 2012   REMOVED spring 2014   TONSILLECTOMY     Family History  Problem Relation Age of Onset   Alzheimer's disease Mother    Heart attack Father 58   Diabetes Brother    COPD Brother    Dementia Brother    Stroke Brother    Hypertension Brother         X 3   Heart disease Paternal Grandfather        in 63s   Coronary artery disease Brother        CBAG/vavlve replacement   Cancer Neg Hx    Colon cancer Neg Hx    Colon polyps Neg Hx    Rectal cancer Neg Hx    Stomach cancer Neg Hx    Esophageal cancer Neg Hx    Social History   Occupational History   Occupation: Retired    Comment: [employer]  Tobacco Use   Smoking status: Never   Smokeless tobacco: Never   Tobacco comments:    NEVER USED TOBACCO  Vaping Use   Vaping status: Never Used  Substance and Sexual Activity   Alcohol use: Yes    Alcohol/week: 1.0 standard drink of alcohol    Types: 1 Cans of beer per  week    Comment:  1-2 wine / week   Drug use: No   Sexual activity: Yes   Tobacco Counseling Counseling given: No Tobacco comments: NEVER USED TOBACCO  SDOH Screenings   Food Insecurity: No Food Insecurity (04/24/2024)  Housing: Low Risk (04/24/2024)  Transportation Needs: No Transportation Needs (04/24/2024)  Utilities: Not At Risk (04/24/2024)  Alcohol Screen: Low Risk (04/23/2024)  Depression (PHQ2-9): Low Risk (04/24/2024)  Financial Resource Strain: Low Risk (04/23/2024)  Physical Activity: Inactive (04/24/2024)  Social Connections: Moderately Integrated (04/24/2024)  Stress: No Stress Concern Present (04/24/2024)  Tobacco Use: Low Risk (04/24/2024)  Health Literacy: Adequate Health Literacy (04/24/2024)   See flowsheets for full screening details  Depression Screen PHQ 2 & 9 Depression Scale- Over the past 2 weeks, how often have you been bothered by any of the following problems? Little interest or pleasure in doing things: 0 Feeling down, depressed, or hopeless (PHQ Adolescent also includes...irritable): 3 PHQ-2 Total Score: 3 Trouble falling  or staying asleep, or sleeping too much: 1 (gets 6-7hrs of sleep) Feeling tired or having little energy: 0 Poor appetite or overeating (PHQ Adolescent also includes...weight loss): 0 Feeling bad about yourself - or that you are a failure or have let yourself or your family down: 0 Trouble concentrating on things, such as reading the newspaper or watching television (PHQ Adolescent also includes...like school work): 0 Moving or speaking so slowly that other people could have noticed. Or the opposite - being so fidgety or restless that you have been moving around a lot more than usual: 0 Thoughts that you would be better off dead, or of hurting yourself in some way: 0 PHQ-9 Total Score: 4 If you checked off any problems, how difficult have these problems made it for you to do your work, take care of things at home, or get along with other people?: Not  difficult at all  Depression Treatment Depression Interventions/Treatment : EYV7-0 Score <4 Follow-up Not Indicated     Goals Addressed               This Visit's Progress     Patient Stated (pt-stated)        Patient stated he plans to continue being active             Objective:    Today's Vitals   04/24/24 0953 04/24/24 1012  BP: (!) 142/80 138/78  Pulse: 94   SpO2: 96%   Weight: 231 lb 12.8 oz (105.1 kg)   Height: 5' 10.5 (1.791 m)    Body mass index is 32.79 kg/m.  Hearing/Vision screen Hearing Screening - Comments:: Denies hearing difficulties   Vision Screening - Comments:: Wears eyeglasses for reading - up to date with routine eye exams with an Optometrist Immunizations and Health Maintenance Health Maintenance  Topic Date Due   DTaP/Tdap/Td (2 - Td or Tdap) 09/06/2020   COVID-19 Vaccine (7 - 2025-26 season) 11/20/2023   Medicare Annual Wellness (AWV)  04/24/2025   Pneumococcal Vaccine: 50+ Years  Completed   Influenza Vaccine  Completed   Hepatitis C Screening  Completed   Zoster Vaccines- Shingrix  Completed   Meningococcal B Vaccine  Aged Out   Colonoscopy  Discontinued        Assessment/Plan:  This is a routine wellness examination for [place].  Patient Care Team: Geofm Glade PARAS, MD as PCP - General (Internal Medicine) Timmy Maude SAUNDERS, MD as Consulting Physician (Oncology) Jordan, Peter M, MD as Consulting Physician (Cardiology) Timmy Maude SAUNDERS, MD as Consulting Physician (Oncology)  I have personally reviewed and noted the following in the patients chart:   Medical and social history Use of alcohol, tobacco or illicit drugs  Current medications and supplements including opioid prescriptions. Functional ability and status Nutritional status Physical activity Advanced directives List of other physicians Hospitalizations, surgeries, and ER visits in previous 12 months Vitals Screenings to include cognitive, depression, and  falls Referrals and appointments  Orders Placed This Encounter  Procedures   Flu vaccine HIGH DOSE PF(Fluzone Trivalent)   In addition, I have reviewed and discussed with patient certain preventive protocols, quality metrics, and best practice recommendations. A written personalized care plan for preventive services as well as general preventive health recommendations were provided to patient.   Verdie CHRISTELLA Saba, CMA   04/24/2024   Return in 1 year (on 04/24/2025).  After Visit Summary: (In Person-Declined) Patient declined AVS at this time.  Nurse Notes: scheduled 2027 AWV appt "

## 2024-04-24 NOTE — Patient Instructions (Signed)
 [name],  Thank you for taking the time for your Medicare Wellness Visit. I appreciate your continued commitment to your health goals. Please review the care plan we discussed, and feel free to reach out if I can assist you further.  Please note that Annual Wellness Visits do not include a physical exam. Some assessments may be limited, especially if the visit was conducted virtually. If needed, we may recommend an in-person follow-up with your provider.  Ongoing Care Seeing your primary care provider every 3 to 6 months helps us  monitor your health and provide consistent, personalized care.   Referrals If a referral was made during today's visit and you haven't received any updates within two weeks, please contact the referred provider directly to check on the status.  Recommended Screenings:  Health Maintenance  Topic Date Due   DTaP/Tdap/Td vaccine (2 - Td or Tdap) 09/06/2020   Flu Shot  10/20/2023   COVID-19 Vaccine (7 - 2025-26 season) 11/20/2023   Medicare Annual Wellness Visit  04/24/2025   Pneumococcal Vaccine for age over 44  Completed   Hepatitis C Screening  Completed   Zoster (Shingles) Vaccine  Completed   Meningitis B Vaccine  Aged Out   Colon Cancer Screening  Discontinued       03/29/2024    8:37 AM  Advanced Directives  Does Patient Have a Medical Advance Directive? Yes  Type of Advance Directive Living will;Healthcare Power of Attorney  Does patient want to make changes to medical advance directive? No - Patient declined    Vision: Annual vision screenings are recommended for early detection of glaucoma, cataracts, and diabetic retinopathy. These exams can also reveal signs of chronic conditions such as diabetes and high blood pressure.  Dental: Annual dental screenings help detect early signs of oral cancer, gum disease, and other conditions linked to overall health, including heart disease and diabetes.

## 2024-05-01 ENCOUNTER — Encounter: Admitting: Internal Medicine

## 2024-05-10 ENCOUNTER — Ambulatory Visit: Admitting: Cardiology

## 2024-06-21 ENCOUNTER — Inpatient Hospital Stay

## 2024-06-21 ENCOUNTER — Inpatient Hospital Stay: Attending: Hematology & Oncology

## 2024-06-21 ENCOUNTER — Inpatient Hospital Stay: Admitting: Hematology & Oncology

## 2025-05-05 ENCOUNTER — Ambulatory Visit

## 2025-05-05 ENCOUNTER — Encounter: Admitting: Internal Medicine
